# Patient Record
Sex: Female | Born: 1935 | Race: White | Hispanic: No | Smoking: Former smoker
Health system: Southern US, Community
[De-identification: ages and names within clinical notes are randomized; demographics above are authoritative.]

## PROBLEM LIST (undated history)

## (undated) DIAGNOSIS — H409 Unspecified glaucoma: Secondary | ICD-10-CM

## (undated) DIAGNOSIS — E785 Hyperlipidemia, unspecified: Secondary | ICD-10-CM

## (undated) DIAGNOSIS — S72002A Fracture of unspecified part of neck of left femur, initial encounter for closed fracture: Secondary | ICD-10-CM

## (undated) DIAGNOSIS — D62 Acute posthemorrhagic anemia: Secondary | ICD-10-CM

## (undated) DIAGNOSIS — F329 Major depressive disorder, single episode, unspecified: Secondary | ICD-10-CM

## (undated) DIAGNOSIS — S42309A Unspecified fracture of shaft of humerus, unspecified arm, initial encounter for closed fracture: Secondary | ICD-10-CM

## (undated) DIAGNOSIS — K602 Anal fissure, unspecified: Secondary | ICD-10-CM

## (undated) DIAGNOSIS — R259 Unspecified abnormal involuntary movements: Secondary | ICD-10-CM

## (undated) DIAGNOSIS — M79609 Pain in unspecified limb: Secondary | ICD-10-CM

## (undated) DIAGNOSIS — J189 Pneumonia, unspecified organism: Secondary | ICD-10-CM

## (undated) DIAGNOSIS — M810 Age-related osteoporosis without current pathological fracture: Secondary | ICD-10-CM

## (undated) DIAGNOSIS — S0232XA Fracture of orbital floor, left side, initial encounter for closed fracture: Secondary | ICD-10-CM

## (undated) DIAGNOSIS — H6120 Impacted cerumen, unspecified ear: Secondary | ICD-10-CM

## (undated) DIAGNOSIS — H3530 Unspecified macular degeneration: Secondary | ICD-10-CM

## (undated) DIAGNOSIS — I2699 Other pulmonary embolism without acute cor pulmonale: Secondary | ICD-10-CM

## (undated) DIAGNOSIS — H9190 Unspecified hearing loss, unspecified ear: Secondary | ICD-10-CM

## (undated) DIAGNOSIS — E871 Hypo-osmolality and hyponatremia: Secondary | ICD-10-CM

## (undated) DIAGNOSIS — J439 Emphysema, unspecified: Secondary | ICD-10-CM

## (undated) DIAGNOSIS — E538 Deficiency of other specified B group vitamins: Secondary | ICD-10-CM

## (undated) DIAGNOSIS — S02401A Maxillary fracture, unspecified, initial encounter for closed fracture: Secondary | ICD-10-CM

## (undated) DIAGNOSIS — G250 Essential tremor: Secondary | ICD-10-CM

## (undated) DIAGNOSIS — D518 Other vitamin B12 deficiency anemias: Secondary | ICD-10-CM

## (undated) DIAGNOSIS — R1031 Right lower quadrant pain: Secondary | ICD-10-CM

## (undated) DIAGNOSIS — I10 Essential (primary) hypertension: Secondary | ICD-10-CM

## (undated) DIAGNOSIS — Z7901 Long term (current) use of anticoagulants: Secondary | ICD-10-CM

## (undated) DIAGNOSIS — D649 Anemia, unspecified: Secondary | ICD-10-CM

## (undated) DIAGNOSIS — R1011 Right upper quadrant pain: Secondary | ICD-10-CM

## (undated) DIAGNOSIS — G252 Other specified forms of tremor: Secondary | ICD-10-CM

## (undated) DIAGNOSIS — R634 Abnormal weight loss: Secondary | ICD-10-CM

## (undated) DIAGNOSIS — R079 Chest pain, unspecified: Secondary | ICD-10-CM

## (undated) DIAGNOSIS — K565 Intestinal adhesions [bands], unspecified as to partial versus complete obstruction: Secondary | ICD-10-CM

## (undated) DIAGNOSIS — R269 Unspecified abnormalities of gait and mobility: Secondary | ICD-10-CM

## (undated) DIAGNOSIS — F411 Generalized anxiety disorder: Secondary | ICD-10-CM

## (undated) DIAGNOSIS — E039 Hypothyroidism, unspecified: Secondary | ICD-10-CM

## (undated) DIAGNOSIS — M545 Low back pain, unspecified: Secondary | ICD-10-CM

## (undated) DIAGNOSIS — S01312A Laceration without foreign body of left ear, initial encounter: Secondary | ICD-10-CM

## (undated) DIAGNOSIS — M1990 Unspecified osteoarthritis, unspecified site: Secondary | ICD-10-CM

## (undated) DIAGNOSIS — K2970 Gastritis, unspecified, without bleeding: Secondary | ICD-10-CM

## (undated) DIAGNOSIS — H269 Unspecified cataract: Secondary | ICD-10-CM

## (undated) HISTORY — DX: Age-related osteoporosis without current pathological fracture: M81.0

## (undated) HISTORY — DX: Unspecified hearing loss, unspecified ear: H91.90

## (undated) HISTORY — DX: Pain in unspecified limb: M79.609

## (undated) HISTORY — DX: Other pulmonary embolism without acute cor pulmonale: I26.99

## (undated) HISTORY — DX: Chest pain, unspecified: R07.9

## (undated) HISTORY — DX: Essential tremor: G25.0

## (undated) HISTORY — DX: Essential (primary) hypertension: I10

## (undated) HISTORY — DX: Right upper quadrant pain: R10.11

## (undated) HISTORY — DX: Long term (current) use of anticoagulants: Z79.01

## (undated) HISTORY — DX: Hyperlipidemia, unspecified: E78.5

## (undated) HISTORY — DX: Unspecified glaucoma: H40.9

## (undated) HISTORY — DX: Emphysema, unspecified: J43.9

## (undated) HISTORY — DX: Major depressive disorder, single episode, unspecified: F32.9

## (undated) HISTORY — DX: Anemia, unspecified: D64.9

## (undated) HISTORY — DX: Other vitamin B12 deficiency anemias: D51.8

## (undated) HISTORY — DX: Low back pain, unspecified: M54.50

## (undated) HISTORY — DX: Low back pain: M54.5

## (undated) HISTORY — DX: Unspecified abnormal involuntary movements: R25.9

## (undated) HISTORY — DX: Abnormal weight loss: R63.4

## (undated) HISTORY — DX: Unspecified abnormalities of gait and mobility: R26.9

## (undated) HISTORY — DX: Intestinal adhesions (bands), unspecified as to partial versus complete obstruction: K56.50

## (undated) HISTORY — DX: Generalized anxiety disorder: F41.1

## (undated) HISTORY — PX: ABDOMINAL HYSTERECTOMY: SHX81

## (undated) HISTORY — DX: Unspecified osteoarthritis, unspecified site: M19.90

## (undated) HISTORY — DX: Unspecified cataract: H26.9

## (undated) HISTORY — DX: Other specified forms of tremor: G25.2

## (undated) HISTORY — DX: Hypo-osmolality and hyponatremia: E87.1

## (undated) HISTORY — DX: Impacted cerumen, unspecified ear: H61.20

## (undated) HISTORY — DX: Right lower quadrant pain: R10.31

## (undated) HISTORY — DX: Unspecified macular degeneration: H35.30

## (undated) HISTORY — DX: Hypothyroidism, unspecified: E03.9

## (undated) HISTORY — DX: Anal fissure, unspecified: K60.2

---

## 1978-11-06 HISTORY — PX: EXCISIONAL HEMORRHOIDECTOMY: SHX1541

## 1991-11-07 HISTORY — PX: BLADDER SUSPENSION: SHX72

## 2001-11-06 HISTORY — PX: BACK SURGERY: SHX140

## 2005-07-05 ENCOUNTER — Inpatient Hospital Stay (HOSPITAL_COMMUNITY): Admission: EM | Admit: 2005-07-05 | Discharge: 2005-07-22 | Payer: Self-pay | Admitting: *Deleted

## 2005-07-07 ENCOUNTER — Encounter (INDEPENDENT_AMBULATORY_CARE_PROVIDER_SITE_OTHER): Payer: Self-pay | Admitting: *Deleted

## 2007-04-17 ENCOUNTER — Emergency Department (HOSPITAL_COMMUNITY): Admission: EM | Admit: 2007-04-17 | Discharge: 2007-04-17 | Payer: Self-pay | Admitting: Emergency Medicine

## 2007-11-03 ENCOUNTER — Emergency Department (HOSPITAL_COMMUNITY): Admission: EM | Admit: 2007-11-03 | Discharge: 2007-11-03 | Payer: Self-pay | Admitting: Emergency Medicine

## 2007-11-07 HISTORY — PX: TRIGGER FINGER RELEASE: SHX641

## 2008-02-14 ENCOUNTER — Inpatient Hospital Stay (HOSPITAL_COMMUNITY): Admission: EM | Admit: 2008-02-14 | Discharge: 2008-02-17 | Payer: Self-pay | Admitting: Emergency Medicine

## 2008-02-14 ENCOUNTER — Ambulatory Visit: Payer: Self-pay | Admitting: Cardiology

## 2008-03-22 ENCOUNTER — Inpatient Hospital Stay (HOSPITAL_COMMUNITY): Admission: EM | Admit: 2008-03-22 | Discharge: 2008-03-30 | Payer: Self-pay | Admitting: Emergency Medicine

## 2010-06-06 DEATH — deceased

## 2011-03-21 NOTE — Op Note (Signed)
Jane [name], Jane [name]               ACCOUNT NO.:  [id number]   MEDICAL RECORD NO.:  [id number]          PATIENT TYPE:  INP   LOCATION:  1302                         FACILITY:  [facility]   PHYSICIAN:  Angelia Mould. Derrell Lolling, M.D.DATE OF BIRTH:  06-03-1925   DATE OF PROCEDURE:  03/23/2008  DATE OF DISCHARGE:                               OPERATIVE REPORT   PREOPERATIVE DIAGNOSIS:  Small bowel obstruction.   POSTOPERATIVE DIAGNOSIS:  Small bowel obstruction secondary to  adhesions.   OPERATION PERFORMED:  Exploratory laparotomy, lysis of adhesions.   SURGEON:  Dr. Claud Kelp.   OPERATIVE INDICATIONS:  This is an 77 year old white female who  presented to the emergency room late last night with abdominal pain,  nausea and vomiting for 36 to 48 hours.  X-rays and physical exam were  consistent with small bowel obstruction.  She was somewhat tender last  night.  White blood cell count was normal.  This morning she was stable  but still somewhat distended and somewhat tender.  CT scan showed a high-  grade small bowel obstruction with transition point in the mid ileum.  This appeared to be proximal to the staple line where she had had a  previous small bowel resection in 2006 for a bowel obstruction.  There  were no significant hernias noted.  There was some ascites.  Because of  her continued tenderness, I elected to proceed with laparotomy.   OPERATIVE FINDINGS:  The patient had a clear-cut mechanical small bowel  obstruction with significantly dilated small bowel all the way down to  the mid ileum to the proximal ileum and then the distal ileum was  completely collapsed distal to the adhesive obstruction.  There was a  small bowel anastomosis in the distal ileum about 1 foot proximal to the  ileocecal valve where Dr. Ezzard Standing had done small bowel resection before.  There were no problems with that anastomosis.  It was wide open and I  could milk small bowel contents through it prograde and  retrograde.  Small bowel was quite distended proximally and was hyperemic but there  was no evidence of any ischemia or perforation.  There was some bloody  ascites present but there was no odor.  Cultures were taken.  I felt  very carefully around the abdominal wall and pelvis for hernia and I did  not find any evidence of femoral or inguinal hernia.  I did not find any  internal evidence of spigelian hernia, although I thought there was a  weakness on the left side of the abdominal wall which she might have an  early spigelian hernia.  There were no defects in the abdominal wall.  The obstruction was clearly due to interloop adhesions.   OPERATIVE TECHNIQUE:  Following the induction of general endotracheal  anesthesia, a Foley catheter was inserted.  Intravenous antibiotics were  given.  The patient's abdomen was prepped and draped in a sterile  fashion.  The patient was identified as correct patient and correct  procedure.  Lower midline laparotomy incision was made through the  previous scar.  The fascia was incised  in the midline and the abdominal  cavity entered under direct vision.  There were no significant adhesions  to the anterior abdominal wall.  I evacuated some ascites and cultured  it.  I ran the small bowel all the way from the ligament Treitz down to  the ileocecal valve.  I took down lots of adhesions spending about 20  minutes taking all the adhesions down and a couple of the adhesions in  the mid ileum were clearly causing a mechanical obstruction.  Once I had  taken all the adhesions down, I could milk small bowel content distally  through the ileum and the ileocecal valve into the cecum.  I could also  milk the small bowel content proximally all the way back up into the  stomach where we evacuated about 800 or 900 mL of bilious fluid.  After  the small bowel was decompressed, it appeared quite healthy and pink.  There was no sign of any ischemia or inflammatory  process otherwise.  I  irrigated the abdomen with saline.  The irrigation fluid was completely  clear.  There was no bleeding.  The small bowel was carefully returned  to its anatomic position, being careful not to twist or kink it.  The  omentum was returned to its anatomic position.  The midline fascia was  closed with running suture of #1 PDS and skin closed with skin staples.  Clean bandages were placed and the patient taken recovery room in stable  condition.  Estimated blood loss was about 50 mL.  Complications none.  Sponge, needle and instrument counts were correct.      Angelia Mould. Derrell Lolling, M.D.  Electronically Signed     HMI/MEDQ  D:  03/23/2008  T:  03/23/2008  Job:  [id number]   cc:   Frederica Kuster, MD  Fax: [phone]

## 2011-03-21 NOTE — H&P (Signed)
[name], [name] NO.:  [id number]   MEDICAL RECORD NO.:  [id number]          PATIENT TYPE:  EMS   LOCATION:  MAJO                         FACILITY:  [facility]   PHYSICIAN:  Michaelyn Barter, M.D. DATE OF BIRTH:  February 02, 1925   DATE OF ADMISSION:  02/14/2008  DATE OF DISCHARGE:                              HISTORY & PHYSICAL   PRIMARY CARE DOCTOR:  Dr. Jacalyn Lefevre.   CHIEF COMPLAINT:  Chest pain.   HISTORY OF PRESENT ILLNESS:  [name] is an 77 year old female with  a past medical history of hypertension, hypercholesterolemia, and  pulmonary embolism who states that she developed chest pain a couple of  weeks ago.  Her chest pain is  vaguely described, but she initially  pointed to the central region of her chest as the area of involvement.  She later indicated that the pain also involved the left side of her  chest.  She described it as occasionally being severe in intensity.  She  is stating that occasionally her breath is taken away when it is severe.  She has never had similar pain before.  The pain occurs every day,  occasionally a couple of times a day.  Yesterday the pain occurred and  it lasted for several hours.  It makes her feel nervous, her hands began  to shake.  The pain does not radiate to her neck, her back, or down her  arm.  Bending over and exertion make the pain worse.  There are no  alleviating factors identified.  This  morning she was given an aspirin,  and her daughter indicates that the pain became somewhat better.  She  does complain of feeling diaphoretic when the pain occurs.  She denies  the presence of any nausea, vomiting, fevers or chills.   PAST MEDICAL HISTORY:  1. Cardiac catheterization.  The patient states that she had a cardiac      catheterization back in the 1990s at [facility], which is in      [place], [place], the results of which were negative.  2. Macular degeneration.  3. Glaucoma.  4.  Cataracts.  5. Small bowel obstruction.  6. Atelectasis.  7. Sinus tachycardia.  8. Dehydration.  9. Pulmonary embolism.  10.Protein calorie malnutrition.  11.Postoperative delirium.  12.Hypertension.  13.Hypothyroidism.  14.Hyperlipidemia.  15.Depression.   OPERATIONS:  Small bowel resection and enterolysis of adhesions  July 07, 2005, by Dr. Ovidio Kin.   ALLERGIES:  1. CELEBREX.  2. PARAFON FORTE.   CURRENT MEDICATIONS:  1. Xalatan 0.005 drops.  2. Lovastatin 20 mg daily.  3. Sertraline HCl 100 mg daily.  4. Lorazepam 0.5 mg.  5. Levothyroxine 75 mcg daily.  6. Dicyclomine 20 mg daily.  7. Diazepam 5 mg daily.  8. Lisinopril 10 mg daily.   SOCIAL HISTORY:  Cigarettes:  The patient started smoking at the age of  77.  She smoked 1 pack per day.  She stopped smoking approximately 10 to  15 years ago.  Alcohol:  The patient denies.   FAMILY HISTORY:  Mother had dementia, CHF and angina.  Father died from  a cerebral hemorrhage.   REVIEW OF SYSTEMS:  As per HPI; otherwise, all other systems are  negative.   PHYSICAL EXAMINATION:  GENERAL:  The patient is awake.  She is  cooperative.  She is in no obvious respiratory distress.  VITAL SIGNS:  Her temperature is 97.5, blood pressure 139/70, heart rate  74, respirations 18, O2 sat is 95%.  HEENT:  Normocephalic, atraumatic.  Anicteric.  Extraocular movements  are intact.  Oral mucosa is pink.  Dentures are present.  NECK:  Supple.  No JVD, no lymphadenopathy.  Strong carotid upstrokes  bilaterally.  CARDIAC:  Heart sounds are somewhat distant.  S1 and S2 can be  appreciated.  RESPIRATORY:  No crackles or wheezes.  ABDOMEN:  Flat, soft, nontender, nondistended.  No masses palpated.  Bowel sounds are present x4 quadrants.  NEUROLOGICAL:  The patient is  alert and oriented x3.  MUSCULOSKELETAL:  There is 5/5 upper and lower  extremity strength.   Chest x-ray reveals no acute cardiopulmonary findings.  An EKG  reveals  normal sinus rhythm.  No obvious Q waves.  Tall R waves are seen in  leads V4, V5 and V6.  PVCs are also noted.  No significant T-wave  abnormalities.  Labs:  CK-MB, POC 2.4.  Troponin-I, POC less than 0.05.  Sodium 138, potassium 4.6, chloride 104, CO2 is 26,  glucose 96, BUN 14,  creatinine 0.77, calcium 9.  D-dimer 0.31.  PT 14.1, INR 1.1, PTT 34.   ASSESSMENT/PLAN:  1. Chest pain.  The etiology of the patient's current chest pain      cardiac versus noncardiac.  The patient has some very atypical      findings with regards to her description of her chest pain in that      it lasts for hours and it started several weeks ago and occurs      daily; however, she does have several cardiovascular risk factors.      Therefore, we will rule the patient out for a cardiac event via      checking her cardiac markers,  including troponin I plus CK-MB x3      q.8 h apart.  We will provide the patient with p.r.n. morphine,      oxygen, nitroglycerin and daily aspirin.  We will check a fasting      lipid profile as well as a 2-D echocardiogram.  May consider      consultation with cardiology.  I suspect that the patient may      benefit from a stress test either as an inpatient versus an      outpatient.  2. Hypertension.  We will resume the patient's previously prescribed      antihypertensive medications for now.  3. Hyperlipidemia.  We will check a fasting lipid profile.  4. History of hypothyroidism.  We will resume the patient's previously      prescribed levothyroxine.  5. Gastrointestinal prophylaxis.  We will provide Protonix.  6. Deep venous thrombosis prophylaxis.  We will provide Lovenox.      Michaelyn Barter, M.D.  Electronically Signed     OR/MEDQ  D:  02/14/2008  T:  02/14/2008  Job:  [id number]   cc:   Dr. Jacalyn Lefevre

## 2011-03-21 NOTE — H&P (Signed)
Jane [name], Jane [name]               ACCOUNT NO.:  [id number]   MEDICAL RECORD NO.:  [id number]          PATIENT TYPE:  INP   LOCATION:  0104                         [facility]ACILITY:  [facility]   PHYSICIAN:  Angelia Mould. Derrell Lolling, M.D.DATE OF BIRTH:  06-30-1925   DATE OF ADMISSION:  03/22/2008  DATE OF DISCHARGE:                              HISTORY & PHYSICAL   CHIEF COMPLAINT:  Abdominal pain, nausea and vomiting.   HISTORY OF PRESENT ILLNESS:  This is an 77 year old white female who  lives at [facility].  Her health has been stable recently.  She has a  history of exploratory laparotomy and small bowel resection for an  adhesive mechanical small bowel obstruction in 2006 by Dr. Ovidio Kin.  She states that she was going to see Dr. Ezzard Standing in the office this  coming week because she has had lower abdominal hernias that have been  bothering her.   She came to the emergency room tonight with a 2-day history of low  abdominal pain, nausea and vomiting.  She states the pain began in the  early morning hours of Saturday, May 16.  She says the pain in the lower  abdomen is fairly constant, not really crampy in nature.  She had a lot  of brown, non-bloody diarrhea yesterday but no more stools today.  She  vomited several times yesterday and has vomited 4 or 5 times today.  She  says one time when she vomited last night, the emesis looked black to  her.  She has not seen any blood.   She came to the emergency room and was evaluated by Dr. Lorre Nick.  Abdominal x-rays showed some dilated segments of small bowel with some  air-fluid levels and there is no air in the colon.  There is no free  air.  I was asked to evaluate her.   PAST HISTORY:  1. Exploratory laparotomy and small bowel resection for a closed loop      obstruction by Dr. Ovidio Kin on July 07, 2005.  2. Total abdominal hysterectomy, bilateral salpingo-oophorectomy.  3. History of bladder suspension surgery.  4. History of  mild COPD.  5. Hypertension.  6. Hypothyroidism.  7. Depression.  8. Hyperlipidemia.  9. Lower abdominal hernias by history.  10.Macular degeneration and glaucoma.  11.Recent hospitalization April 10 through February 17, 2008 for chest      pain.  Evaluated by Dr. Michaelyn Barter and by Dr. Simona Huh of      [facility].  Myoview stress showed no evidence of ischemia      and no scar was seen.  Ejection fraction was 58%.  She declined      further cardiac testing.   CURRENT MEDICATIONS:  1. Lovastatin 20 mg a day.  2. Ultram 100 mg a day.  3. Levothyroxine 75 mcg a day.  4. Dicyclomine 20 mg a day.  5. Zoloft 100 mg a day.  6. Lisinopril 10 mg a day.  7. Xalatan drops 0.005% at bedtime.   DRUG ALLERGIES:  CELEBREX, PARAFON FORTE.   SOCIAL HISTORY:  The patient lives  in [place] at [facility], having  moved from [place] about 4 years ago.  She is married but her  husband has severe Alzheimer's and lives at [facility].  The patient has two children and one daughter lives in  [place].  The patient states she quit smoking 15 years ago and drinks  alcohol rarely.   FAMILY HISTORY:  Father died age 36 or cerebral hemorrhage.  Mother died  age 100 of congestive heart failure, angina and dementia.   REVIEW OF SYSTEMS:  A 15-system system review of systems is performed  and is noncontributory except as described above.   PHYSICAL EXAMINATION:  GENERAL:  Thin, alert, oriented white female in  minimal distress.  Very pleasant.  VITAL SIGNS:  Temperature 98, blood pressure 139/87, pulse 94,  respirations 18.  HEENT:  Sclerae clear.  Extraocular is intact.  Ears, mouth, throat,  nose, lips, tongue, and oropharynx are without gross lesions.  She has  full dentures.  NECK:  Supple, nontender.  No bruit.  No jugular distention.  No  adenopathy.  No thyroid mass.  LUNGS:  Breath sounds are distant, but there is no wheezing or rhonchi.  There is no  chest wall tenderness.  HEART:  Regular rate and rhythm.  No ectopy, no murmur.  Radial and  femoral pulses are palpable.  No peripheral edema.  ABDOMEN:  She is  mildly distended, diffusely tender with minimal involuntary guarding.  Hypoactive bowel sounds.  Lower midline scar.  I really cannot feel a  mass or hernia in the abdomen or inguinal areas despite the history of  hernia.  Liver and spleen not enlarged.  EXTREMITIES:  She moves all four extremities well without pain or  deformity.  No peripheral edema.  NEUROLOGIC:  No gross motor sensory deficits.   LABORATORY DATA:  Data are reviewed.  I reviewed her abdominal x-rays  which suggest a small bowel obstruction.  Lab work reveals a hemoglobin  of 12, white blood cell count of 4800 with a normal differential.  Complete metabolic panel is normal except for glucose of 165 and BUN of  28 and creatinine 0.96.  Liver function tests normal.  Urinalysis is  basically normal.   ASSESSMENT:  1. Small-bowel obstruction.  I doubt that she has compromised bowel at      this time although close clinical followup is warranted due to her      tenderness.  Suspect this is due to adhesions although there is a      history of hernia not apparent on physical exam this evening.  2. Status post small bowel resection for a mechanical small bowel      obstruction in 2006.  3. Status post hysterectomy and bilateral salpingo-oophorectomy.  4. History of bladder suspension.  5. Hypertension.  6. Hypothyroidism.  7. Mild chronic obstructive pulmonary disease.   PLAN:  1. The patient will be admitted, started on IV fluid rehydration and      nasogastric tube will be inserted to decompress the stomach.  2. We will proceed with a CT scan of the abdomen and pelvis later on      tonight to make sure that I am not missing      a hernia or a focal inflammatory process.  This will also help to      assess whether there is a transition zone.  3. She will be  followed closely.  If she does not improve or her  tenderness persists, she may require laparotomy within the next 24      hours.      Angelia Mould. Derrell Lolling, M.D.  Electronically Signed     HMI/MEDQ  D:  03/22/2008  T:  03/23/2008  Job:  [id number]   cc:   Bertram Millard. Hyacinth Meeker, M.D.  Fax: [phone]   Sandria Bales. Ezzard Standing, M.D.  [address], [address]  [place]  [place] [address]

## 2011-03-21 NOTE — Consult Note (Signed)
NAMERAMANDA, PAULES               ACCOUNT NO.:  [id number]   MEDICAL RECORD NO.:  [id number]          PATIENT TYPE:  INP   LOCATION:  1302                         [facility]ACILITY[facility]   PHYSICIAN:  Hillery Aldo, M.D.   DATE OF BIRTH:  July 09, 1925   DATE OF CONSULTATION:  03/25/2008  DATE OF DISCHARGE:                                 CONSULTATION   PRIMARY CARE PHYSICIAN:  Medical laboratory scientific officer.   PERSON REQUESTING CONSULTATION:  Angelia Mould. Derrell Lolling, M.D.   REASON FOR CONSULTATION:  Postoperative delirium, medical management of  chronic medical problems.   HISTORY OF PRESENT ILLNESS:  The patient is an 77 year old female who  was admitted by Dr. Derrell Lolling on Mar 22, 2008, for evaluation of abdominal  pain secondary to recurrent small bowel obstruction.  The patient has a  history of small bowel obstruction and had a small bowel resection back  in 2006.  She was found to have a recurrent obstruction and underwent  exploratory laparotomy with lysis of adhesion on Mar 23, 2008.  Currently, the patient has developed altered mental status  postoperatively and therefore internal medicine consultation was  requested to evaluate this to help manage her significant chronic  medical problems.  Speaking with the nursing staff, the nurses report  that the patient has been intermittently combative last night but today  has been calmer though she is disoriented and confused.  She apparently  has received multiple doses of morphine for pain control over the night.   PAST MEDICAL HISTORY:  1. Mild chronic obstructive pulmonary disease.  2. Hypertension.  3. Hypothyroidism.  4. Depression.  5. Hyperlipidemia.  6. Abdominal hernia.  7. Macular degeneration.  8. Glaucoma.  9. History of pulmonary embolism after small bowel resection in 2006.  10.History of postoperative delirium.  11.Cataracts.  12.Normal stress test February 16, 2008, with an ejection fraction of      58%.   PAST SURGICAL HISTORY:  1. Exploratory laparotomy with small bowel resection in 2006.  2. Total abdominal hysterectomy/bilateral salpingo-oophorectomy.  3. Bladder suspension surgery.  4. Back surgery.   SOCIAL HISTORY:  The patient is married.  Her husband has Alzheimer's  dementia and resides at [facility] while the patient lives at  [facility].  She quit smoking approximately  15 years ago and prior to this smoked one pack per day for 40-45 years.  She rarely drinks alcohol.  She is retired.   FAMILY HISTORY:  The patient's father died at 50 from a cerebral  hemorrhage.  The patient's mother died at 42 from congestive heart  failure, she also had angina and dementia.   CURRENT MEDICATIONS:  1. Synthroid 37.5 mg IV daily.  2. Protonix 40 mg IV daily.  3. Morphine 0.5 to 1 mg q.1 h p.r.n.  4. Zofran 4 mg IV q.6 h p.r.n.  5. Phenergan 12.5 mg q.4 h p.r.n.   ALLERGIES:  Celebrex, Parafon Forte.   REVIEW OF SYSTEMS:  The patient denies fever, chills, chest pain,  dyspnea, cough.  She is having abdominal pain and discomfort.  Otherwise, comprehensive 14  point review of systems is negative.   PHYSICAL EXAMINATION:  VITAL SIGNS:  Temperature 99.3, blood pressure  165/87, pulse 101, respirations 18, O2 saturation 90% on 2 liters.  GENERAL:  This is a well-developed, well-nourished female who is  anxious.  HEENT:  Normocephalic, atraumatic.  PERRL.  EOMI.  Oropharynx is clear.  The patient has an NG tube in place draining bilious material.  NECK:  Supple, no thyromegaly, no lymphadenopathy, no jugular venous  distention.  CHEST:  Decreased breath sounds clear bilaterally.  HEART:  Tachycardiac rate, regular rhythm.  No murmurs, rubs, gallops.  ABDOMEN:  Soft.  There are hyperactive bowel sounds with borborygmi.  She is diffusely tender.  EXTREMITIES:  No clubbing, cyanosis or edema.  NEUROLOGICAL:  The patient awakens to voice.  She is disoriented.  She  thinks she is at [facility]  She thinks the month is April.  Otherwise nonfocal.  SKIN:  Warm and dry.  No rashes.  PSYCHIATRIC:  The patient is anxious, would not let go of my hand and  requested that I stay with her.   DATA REVIEW:  Laboratory data:  Sodium is 137, potassium 4.1, chloride  105, bicarb 26, BUN 22, creatinine 1.02, glucose 142.  White blood cell  count 5.7, hemoglobin 11.3, hematocrit 33.6, platelets 233.  TSH is  1.316.   Chest x-ray shows NG tube in place.  There is bilateral basilar streaky  atelectasis or scarring.  Probable small bilateral pleural effusions.  No acute edema.   ASSESSMENT AND PLAN:  1. Small bowel obstruction status post exploratory laparoscopy and      lysis of adhesions:  The patient currently has NG tube to low      suction.  She is having some significant postoperative pain and      would manage this as conservatively as possible given her delirium.  2. Postoperative delirium:  This is likely due to narcotics given her      known history of this from prior surgeries.  Would try to limit the      narcotic use as much as possible while still controlling her pain.      The patient has a history of postoperative pulmonary embolism and      she does appear to be slightly hypoxic with her oxygen saturation      of only 90% on 2 liters as well as her tachycardia.  She will need      a scan to rule out recurrent pulmonary embolism.  3. Renal insufficiency:  Continue IV fluids.  Unclear if her current      creatinine clearance would allow for a CT angiogram to rule out      pulmonary embolism.  If not, I would obtain a V/Q scan.  4. Dyslipidemia:  Will resume the patient's statin therapy when she is      taking p.o.'s.  5. Hypothyroidism:  Continue the patient's Synthroid at half her usual      dose through the intravenous route.  Her TSH reveals this is under      good control.  6. Depression/anxiety:  Resume the patient's Zoloft when she is taking       adequate p.o.'s.  7. Hypertension:  We will resume the patient's lisinopril when she is      taking p.o.'s.  At this point, I would add a low dose beta-blocker      for blood pressure.  8. Glaucoma:  Continue the  patient's Xalatan eye drops.  9. Chronic obstructive pulmonary disease:  The patient's respiratory      status was stable.  Would administer pulmonary toilet while awake.  10.History of pulmonary embolism:  The patient has been on Lovenox      prophylaxis but would rule out recurrent pulmonary embolus given      her known history of same after previous surgery.   Thank you for this consultation.  We will follow the patient with you.      Hillery Aldo, M.D.  Electronically Signed     CR/MEDQ  D:  03/25/2008  T:  03/25/2008  Job:  [id number]

## 2011-03-21 NOTE — Discharge Summary (Signed)
NAMEMCKENZI, BUONOMO               ACCOUNT NO.:  [id number]   MEDICAL RECORD NO.:  [id number]          PATIENT TYPE:  INP   LOCATION:  3737                         FACILITY:  [facility]   PHYSICIAN:  Michaelyn Barter, M.D. DATE OF BIRTH:  [birth year]   DATE OF ADMISSION:  02/14/2008  DATE OF DISCHARGE:  02/17/2008                               DISCHARGE SUMMARY   FINAL DIAGNOSES:  Chest pain.   PROCEDURES:  Nuclear medicine myocardial perfusion stress test completed  on February 16, 2008.   CONSULTATIONS:  [facility].   HISTORY OF PRESENT ILLNESS:  Ms. Ditto is an 77 year old female who  states she developed chest pain several weeks prior to the admission.  Her chest pain was vaguely described, but she initially pointed to the  central region of her chest as the area of involvement as well as the  left side of her chest.  She described it as occasionally severe in  intensity and occasionally takes her breath away when it as severest.  The pain occurred every day, lasting several hours a day.   PAST MEDICAL HISTORY:  Please see that dictated by Dr. Michaelyn Barter.   HOSPITAL COURSE:  Chest pain.  The patient was admitted to this  telemetry floor.  Her cardiac markers were cycled.  Her troponin-I was  found to be 0.04, 0.02 x2.  Likewise, her CK-MBs were also found to be  3.8, 2.9, and 2.3.  She, therefore, ruled out for cardiac event.  An EKG  was completed, it revealed normal sinus rhythm with an occasional PVC.  A chest x-ray was also completed, which revealed a resolved bilateral  pleural effusion.  No acute cardiopulmonary abnormalities were noted.  [facility] was consulted, Dr. Nona Dell saw the patient on  February 15, 2008.  He indicated that the patient's symptoms were  concerning for angina.  He concurred that the patient's cardiac markers  did argue again high-risk acute coronary syndrome; however, her EKG was  nonspecific.  He went on to state that  because of the patient's multiple  cardiac risk factors and the description of fairly typical angina, he  would prefer to perform a cardiac catheterization to clearly define the  patient's coronary anatomy and assess for any revascularizations  strategies.  The patient, however, preferred not to pursue a cardiac  catheterization at this point, but indicated that she would consider an  inpatient Myoview test.  A nuclear medicine myocardial stress test was  completed on February 16, 2008.  The final impression was that, there was a  normal study demonstrating no evidence of inducible ischemia with  treadmill exercise.  No scar was demonstrated.  Normal left ventricular  EF within estimated EF of 58% was seen.  The patient indicated that she  felt significantly better by the date of discharge, and she verbalized  that she wanted to be discharged from the hospital.  Dr. Ival Bible  final note indicated that the patient did not want to pursue further  cardiac testing at this particular time.  He stated that she could  follow up with her primary physician,  and if her symptoms persisted, he  will suggest further cardiac evaluation.  The decision has been made at  the request of the patient to discharge her from the hospital.   DISCHARGE VITAL SIGNS:  Her temperature is 97.9, heart rate 79,  respirations 18, blood pressure 116/65, and O2 sat 97% on 3 L of oxygen.   MEDICATIONS:  The patient will be discharged home on:  1. Lovastatin 20 mg p.o. daily.  2. Lisinopril 10 mg p.o. daily, as well as an aspirin 81 mg p.o.      daily.  She will be told to continue all of her prior home      medications.      Michaelyn Barter, M.D.  Electronically Signed     OR/MEDQ  D:  02/17/2008  T:  02/18/2008  Job:  [id number]

## 2011-03-21 NOTE — Consult Note (Signed)
NAMEJUNO, [name] NO.:  [id number]   MEDICAL RECORD NO.:  [id number]          PATIENT TYPE:  INP   LOCATION:  3737                         FACILITY:  [facility]   PHYSICIAN:  Jane Sidle, MD DATE OF BIRTH:  [birth year]   DATE OF CONSULTATION:  DATE OF DISCHARGE:                                 CONSULTATION   REQUESTING PHYSICIAN:  Dr. Michaelyn [name] with the [facility].   PRIMARY CARE PHYSICIAN:  Dr. Jacalyn Lefevre   REASON FOR CONSULTATION:  Chest pain.   HISTORY OF PRESENT ILLNESS:  Jane [name] is a pleasant 77 year old  woman with a reported history of hypertension, hyperlipidemia, prior  small-bowel obstruction with a postoperative pulmonary embolus back in  2006 treated with Coumadin, hypothyroidism, and reportedly a reassuring  cardiac catheterization done approximately 10-15 years ago at a facility  in [place].  She states that she has long-term chronic lung disease  due to tobacco use and has had stable dyspnea on exertion typically at  NYHA class II, somewhat worse with increased levels of activity such as  going up an incline.  Over the last 2 weeks she has been experiencing a  violent substernal chest pain with activity associated with a feeling  of more intense breathlessness and at times a numbness in her left arm.  She states that when this occurs she stops what she is doing and rests,  and her symptoms typically abate.  She notes that inhalers have not been  helpful.  She also states that she was placed on a proton pump inhibitor  recently without significant improvement.  She was admitted to the  hospital for observation and has not had any symptoms at rest.  Her  electrocardiogram shows sinus rhythm with premature ventricular  complexes and nonspecific ST-T wave changes.  Cardiac markers have been  normal.  Her LDL cholesterol is elevated at 136.  Otherwise, she has had  no specific dysrhythmias.  She denies having any  follow-up ischemic  evaluation since her remote catheterization.   ALLERGIES:  1. CELEBREX.  2. PARAFON FORTE.   PRESENT MEDICATIONS:  1. Aspirin 325 mg p.o. daily.  2. Enoxaparin 40 mg subcu q.h.s.  3. Xalatan eyedrops.  4. Levothyroxine 75 mcg p.o. daily.  5. Prinivil 10 mg p.o. daily.  6. Toprol-XL 25 mg p.o. daily.  7. Protonix 40 mg p.o. daily.  8. Zoloft 100 mg p.o. daily.  9. Zocor 10 mg p.o. q.h.s.   PAST MEDICAL HISTORY:  Is outlined above.  Additional problems include:  1. Macular degeneration.  2. Glaucoma.  3. Cataracts.  4. History of depression.   SOCIAL HISTORY:  The patient has a longstanding tobacco-use history.  She stopped smoking approximately 10-15 years ago.  Denies any alcohol  use.  She has been living in [place] the last 4 years.  She has a  daughter that lives locally.   SURGICAL HISTORY:  Includes small-bowel resection and lysis of adhesions  back in 2006.   FAMILY HISTORY:  Was reviewed.  No clear history of premature  cardiovascular disease noted.  REVIEW OF SYSTEMS:  As outlined above.  No obvious cough or hemoptysis.  No palpitations or syncope.  No orthopnea, PND, lower extremity edema.  Otherwise negative.   EXAMINATION:  Temperature is 98.4 degrees, heart rate 74, respirations  24, systolic blood pressure 98-138 over diastolic 62-79, oxygen  saturation is 98% on 3 liters nasal cannula.  [name], elderly  woman in no acute distress without active chest pain.  HEENT:  Conjunctivae and lids are normal.  Oropharynx is clear.  NECK:  Supple.  No elevated jugular venous pressure.  No loud bruits.  No thyromegaly is noted.  LUNGS:  Clear although with diminished breath sounds throughout,  somewhat coarse breath sounds.  No wheezing or labored breathing.  CARDIAC EXAM:  Reveals a regular rate and rhythm.  No loud systolic  murmur or gallop noted.  Possibly soft midsystolic click.  No  pericardial rub.  ABDOMEN:  Soft, nontender.   Bowel sounds present.  EXTREMITIES:  Exhibit no significant pitting edema.  Distal pulses are  2+.  SKIN:  Warm and dry.  MUSCULOSKELETAL:  Kyphosis is noted.  NEUROPSYCHIATRIC:  The patient is alert and oriented x3.  Affect is  appropriate.   LABORATORY DATA:  Wbc's 5.5, hemoglobin 12.3, hematocrit 36.5, platelets  176.  Sodium 139, potassium 4.6, chloride 104, bicarb 26, glucose 96,  BUN 15, creatinine 0.86.  Peak troponin I 0.04, peak CK-MB 3.8.  LDL  136.   Chest x-ray demonstrates borderline cardiomegaly without edema and no  focal airspace disease.   IMPRESSION:  1. A 2-week history of intermittent exertional chest pain associated      with breathlessness, intermittent left arm numbness and also      diaphoresis.  This is concerning for angina.  Her cardiac markers      argue against a high risk acute coronary syndrome, however, and her      electrocardiogram is nonspecific.  She reports a remote cardiac      catheterization approximately 10-15 years ago in [place] which      apparently showed no significant degree of coronary stenosis.  She      has had no follow-up ischemic testing since that time.  2. Apparent history of chronic lung disease, presumably emphysematous,      with a history of longstanding tobacco use.  3. Hypertension.  4. Hyperlipidemia.  5. Prior history of postoperative pulmonary embolus in 2006, treated      with Coumadin, with follow-up normal D-dimer.   RECOMMENDATIONS:  I discussed the situation with the patient and her  daughter.  Frankly, given her cardiac risk factors and description of  fairly typical angina, my preference would be a cardiac catheterization  to clearly  define her coronary anatomy and assess for any revascularizations  strategies.  She preferred not to pursue this option at the present time  although is willing to consider an inpatient Myoview.  This will  therefore be scheduled for tomorrow and we can make further   recommendations pending result review.      Jane Sidle, MD  Electronically Signed     SGM/MEDQ  D:  02/15/2008  T:  02/15/2008  Job:  [id number]   cc:   Jane Kuster, MD  Jane [name], M.D.

## 2011-03-24 NOTE — Discharge Summary (Signed)
Jane [name], Jane [name]               ACCOUNT NO.:  [id number]   MEDICAL RECORD NO.:  [id number]          PATIENT TYPE:  INP   LOCATION:  2019                         FACILITY:  [facility]   PHYSICIAN:  Jonna L. Robb Matar, M.D.DATE OF BIRTH:  29-Aug-1925   DATE OF ADMISSION:  07/05/2005  DATE OF DISCHARGE:                                 DISCHARGE SUMMARY   INTERIM SUMMARY:   PRIMARY CARE PHYSICIAN:  Medical laboratory scientific officer.   CONSULTANT:  Surgery, Dr. Carolynne Edouard.   OPERATION:  July 07, 2005, small bowel resection and enterolysis of  adhesions by Dr. Ezzard Standing.   DIAGNOSES:  1.  Small-bowel obstruction secondary to closed loop obstruction.  2.  Atelectasis.  3.  Sinus tachycardia.  4.  Dehydration.  5.  Pulmonary embolus.  6.  Protein calorie malnutrition.  7.  Postoperative delirium.  8.  Hypertension.  9.  Hypothyroidism.  10. Hyperlipidemia.  11. Depression.   ALLERGIES:  CELEBREX.   CODE STATUS:  Full.   HISTORY:  [age]-year-old hypertensive female with a 4- to 5-day  history of nausea, vomiting and abdominal pain, intense diarrhea just prior.   PHYSICAL EXAMINATION:  Physical exam on admission was notable for heart rate  of 118, 90% O2 saturation, moderate abdominal distress, dehydration with dry  mucosa and positive turgor, distended abdomen, very tender in the right  lower quadrant with rebound and hyperactive bowel sounds.   HOSPITAL COURSE:  The patient was seen in consultation by Surgery regarding  her small-bowel obstruction.  NG was passed.  She was put on n.p.o. and IV  fluids, but did not improve and so the patient was taken to the operating  room, where she was found to have a closed loop obstruction and some  compromise of the small bowel; that area was resected and adhesions were  lysed.  Postoperative, the patient developed significant tachycardia which  responded to a trial of fluids.  Cardiac enzymes were negative.  She was  restarted on some of her  medications IV.  She had postoperative ileus for  several days.  Because of a tachycardia and some tachypnea, CT was done  which showed a pleural embolus, a pleural effusion, atelectasis and COPD,  and the patient was put on Lovenox and after a couple of days, intensive  pulmonary toilet was added.  Her albumin was low and she was having  worsening nutrition, and she was started on TNA.  The patient continued to  have quite a bit of confusion postoperative, but slowly improved.  The  patient was started on Reglan and by the 7th, was starting to pass gas, but  continued to have significant right-sided abdominal pain.   At the time of this dictation, there is some question as to whether the  patient has developed an abscess since the right abdominal pain is  persistent.  She is going to get a CT of the abdomen and subsequent  followup.  She has also had some issues with occasional episodes of SVT.  Discharge disposition and medications will be determined.      Jonna L. Robb Matar, M.D.  Electronically Signed     JLB/MEDQ  D:  07/14/2005  T:  07/14/2005  Job:  [id number]   cc:   [facility]

## 2011-03-24 NOTE — Discharge Summary (Signed)
NAMEKIONI, STAHL               ACCOUNT NO.:  [id number]   MEDICAL RECORD NO.:  [id number]          PATIENT TYPE:  INP   LOCATION:  6743                         FACILITY:  [facility]   PHYSICIAN:  Lonia Blood, M.D.DATE OF BIRTH:  15-Jul-1925   DATE OF ADMISSION:  07/05/2005  DATE OF DISCHARGE:                                 DISCHARGE SUMMARY   NOTE:  Please note, this is an interim discharge summary only.  It covers  the time frame of July 14, 2005 through July 19, 2005.   DISCHARGE DIAGNOSES:  1.  Small bowel obstruction secondary to closed loop obstruction, status      post surgical correction.  2.  Pulmonary embolism.      1.  Newly diagnosed.      2.  Requiring full anticoagulation for a minimum of 6 months.  3.  Sinus tachycardia, resolved - multifactorial.  4.  Protein calorie malnutrition secondary to small bowel obstruction.  5.  Postoperative delirium - resolving.  6.  Hypertension - controlled.  7.  Hypothyroidism - new lower Synthroid dose at 25 mcg p.o. daily.  8.  Hyperlipidemia - holding treatment until intake increased.  9.  Depression.  10. Allergy to CELEBREX.   MEDICATIONS AT THE TIME OF DISCHARGE:  To be determined at actual discharge.   CONSULTATIONS:  General surgery - Dr. Carolynne Edouard.   PROCEDURES:  No new during this interim period.   HISTORY OF PRESENT ILLNESS:  Please see complete dictated H&P per Dr. Lavera Guise,  July 05, 2005, labeled #[id number].   INTERIM HOSPITAL COURSE:  Ms. Rosamae Rocque was under my care between the  dates of July 14, 2005 and July 19, 2005.  The vast majority of  this period of the hospitalization was focused about simply slowly advancing  the patient's diet.  At the initial portion of this hospitalization, the  patient was completely TNA dependent and NPO.  By the end of this stretch of  time, the patient's NPO status was lifted, and TNA was able to be  discontinued.   On outstanding issue at this time is the  patient's pulmonary embolism.  This  is clearly secondary to bedridden status in the perioperative period.  Anticoagulation was not used initially because of high risk for bleeding in  the perioperative period.  Lovenox had to be dosed; however, because of the  pulmonary embolism and extremely high risk of recurrence, the patient  tolerated this throughout the remainder of this hospital period without  evidence of blood loss.  On the date of this dictation, Coumadin is being  initiated.  The patient will require a minimum of 6 months, but more than  likely 12 total months, of anticoagulation, for treatment of her pulmonary  embolism.  Full work-up for hypercoagulable state is not felt to be  necessary, as this seems to be clearly related to bedridden status due to  her small bowel obstruction and surgery.   The patient has a long-standing history of hypothyroidism, for which she  apparently was on 50 mcg of Synthroid p.o. daily previously.  Initially,  this  was decreased to 25 mg IV daily as per usual protocol of converting  p.o. to IV dosing.  With development of tachycardia, which was likely  related to pulmonary embolism, a precaution was taken to discontinue  Synthroid altogether.  On the date of this dictation, I am resuming  Synthroid at 25 mcg p.o. daily with Synthroid to be advanced as necessary  with outpatient follow up.   At the time of this dictation, the patient is improving nicely.  She is not  quite ready for discharge at this time, as we need to assure that she is  tolerating a full regular diet.  Physical therapy and occupational therapy  are actively working with the patient, and decisions are being made to so  appropriate placement.  At this time, it does appear that the patient will  be suitable for independent living with home health assistance in a  supportive environment.      Lonia Blood, M.D.  Electronically Signed     JTM/MEDQ  D:  07/19/2005  T:   07/19/2005  Job:  [id number]

## 2011-03-24 NOTE — Discharge Summary (Signed)
NAMEDALPHINE, COWIE               ACCOUNT NO.:  [id number]   MEDICAL RECORD NO.:  [id number]          PATIENT TYPE:  INP   LOCATION:  1302                         [facility]ACILITY[facility]   PHYSICIAN:  Angelia Mould. Derrell Lolling, M.D.DATE OF BIRTH:  November 02, 1925   DATE OF ADMISSION:  03/22/2008  DATE OF DISCHARGE:  03/30/2008                               DISCHARGE SUMMARY   FINAL DIAGNOSES:  1. Mechanical small bowel obstruction secondary to adhesions.  2. History of laparotomy and small bowel resection for small bowel      obstruction 2006.  3. Status post hysterectomy and bilateral salpingo-oophorectomy.  4. History of bladder suspension surgery.  5. History of mild COPD (chronic obstructive pulmonary disease).  6. Hypertension.  7. Hypothyroidism.  8. Depression.  9. Hyperlipidemia.  10.Macular degeneration and glaucoma.   OPERATION PERFORMED:  Exploratory laparotomy with lysis of adhesions,  date Mar 23, 2008.   HISTORY:  This is an 77 year old white female who lives at [place]  with relatively stable health.  She has a history of small bowel  obstruction in the past requiring laparotomy and small bowel resection  in 2006.  She came to the emergency room with a 48 hour history of lower  abdominal pain, nausea and vomiting and some diarrhea.  She was  evaluated by Dr. Freida Busman in the emergency room where abdominal x-rays  showed some dilated segments of small bowel with air fluid levels but no  free air.  I was called to see her.  I felt that she had a partial small  bowel obstruction and she was admitted to the hospital.   HOSPITAL COURSE:  On the day of admission I felt that the patient's  abdomen was a little bit distended and mildly tender but I did not feel  a mass or hernia and I did not think that she had any compromised bowel  at this time.  We proceeded with CT scan which showed a small bowel  obstruction.  There was lots of dilated small bowel with a transition  possibly in the  mid ileum and mild ascites and a question of a tiny  right inguinal hernia.   The following morning she seemed more tender with more guarding and I  felt that she would need to be explored despite the fact that she had no  fever and her white blood cell count was normal.  She was taken to the  operating room on May 18 and underwent laparotomy.  I found that she had  a mechanical small bowel obstruction.  The bowel was edematous with some  venous congestion but clearly viable and no evidence of ischemia or  tumor.  Lysis of adhesions was performed.   Postoperatively the patient did fairly well.   From a medical standpoint I asked Dr. Trula Ore Rama to follow her from  the medical standpoint to assist in advice regarding managing her  medical problems and she did that very well.   The patient had an ileus for several days and ultimately that resolved  and we were able to get the NG tube out and  resume a diet with return of  bowel function.   Postoperatively she had acute delirium and confusion which got better as  we limited her narcotic and mobilized her.  She also had problems with  COPD and atelectasis but that cleared up with pulmonary toilet and she  never developed pneumonia.  She was maintained on Lovenox for DVT  prophylaxis and maintained on beta-blockers for cardiac prophylaxis.   We started a liquid diet on May 22 and slowly advanced her diet and  activities thereafter.  She improved to the point where she could be  discharged on May 25.  At that time she was tolerating a regular diet  and had had bowel movements, was very talkative and felt well and was  ready to go home.  Her abdomen was soft and her wounds were healing.   FOLLOWUP:  On discharge she was asked to follow up with me in 2 weeks.   DISCHARGE MEDICATIONS:  1. She was given a prescription for Vicodin for pain.  2. She was told to continue her usual medications which include      lovastatin 20 mg a day.  3.  Ultram 100 mg a day.  4. Levothyroxine 75 mcg a day.  5. Dicyclomine 20 mg a day.  6. Zoloft 100 mg a day.  7. Lisinopril 10 mg a day.  8. Xalatan drops 0.005% at bedtime.      Angelia Mould. Derrell Lolling, M.D.  Electronically Signed     HMI/MEDQ  D:  05/03/2008  T:  05/03/2008  Job:  [id number]   cc:   Bertram Millard. Hyacinth Meeker, M.D.  Fax: [phone]

## 2011-03-24 NOTE — H&P (Signed)
[name], [name]               ACCOUNT NO.:  [id number]   MEDICAL RECORD NO.:  [id number]          PATIENT TYPE:  INP   LOCATION:  3309                         FACILITY:  [facility]   PHYSICIAN:  Lonia Blood, M.D.       DATE OF BIRTH:  [birth year]   DATE OF ADMISSION:  07/05/2005  DATE OF DISCHARGE:                                HISTORY & PHYSICAL   CHIEF COMPLAINT:  Abdominal pain.   HISTORY OF PRESENT ILLNESS:  A 77 year old Caucasian woman with a history of  hypertension, hypothyroidism, comes in to the emergency room after being  referred from her primary care physician for 4-5 days of nausea, vomiting,  and abdominal pain.  She was unable to eat during all this.  Before all this  started, she reports intensive diarrhea.  For the past four days she has not  been able to pass any gas, and she has not been able to eat or drink.  An  imaging study done at an outside center was reported as showing a small-  bowel obstruction but no acute appendicitis.   PAST MEDICAL HISTORY:  1.  Hypertension.  2.  Hypothyroidism.  3.  Depression.  4.  Hyperlipidemia.  5.  History of total hysterectomy bilateral salpingo-oophorectomy.  6.  History of bladder suspension surgery.  7.  Also the patient carries a history of some mild emphysema.   SOCIAL HISTORY:  The patient lives in [place], moved here a year ago from  [place].  She is married.  Her husband has Alzheimer disease and she  has a daughter here in [place].  She is retired.  She used to smoke for a  long period of time but quit 15 years ago.  She does not drink any alcohol.   REVIEW OF SYSTEMS:  Negative for chest pain, negative for dyspnea at rest,  positive for dyspnea on exertion.  Of note, the patient never had a  colonoscopy.  Other 12 systems were reviewed and they are all negative.   PHYSICAL EXAMINATION:  VITAL SIGNS:  Temperature 96.8, blood pressure  155/91, heart rate 118, respirations 18, saturating 90% on room  air.  GENERAL:  The patient appears in moderate distress secondary to pain and  nausea.  She is severely dehydrated with dry buccal mucosa and dry skin.  She has positive turgor.  EYES:  Pupils equal, round, reactive to light accommodation.  Extraocular  movements intact.  NECK:  Supple without JVD, without carotid bruits.  LUNGS:  Clear to auscultation bilaterally without wheezes, rhonchi,  crackles.  HEART:  Regular rate and rhythm without murmurs, rubs, or gallops.  ABDOMEN:  Distended, tender especially in the right lower quadrant where the  patient also has rebound tenderness.  She has hyperactive bowel sounds in  the right lower quadrant.  EXTREMITIES:  Have no edema.  She has intact muscle tone and the bulk.  NEUROLOGIC:  III-XIII cranial nerves __________  intact.  PSYCHIATRIC:  She appears to have intact insight, memory, and mood.   There are no laboratory values at the time of this dictation.  ASSESSMENT/PLAN:  1.  Abdominal pain, nausea and vomiting with a very positive abdominal      examination.  I am worried about an acute abdomen, can not rule out a      complicating condition like cancer causing a small-bowel obstruction,      versus appendicitis, versus active ileitis, versus other condition.  The      plan at this point is to check a complete metabolic profile, check a      lipase/amylase, check an acute abdominal series, also get a report of      the CT scan that was done earlier, also the plan is to admit the patient      to a stepdown unit, hydrate her with intravenous fluids, and obtain a      surgical consultation.  2.  Hypothyroidism.  We will check a TSH and continue the Synthroid.  3.  We are going to hold all her blood pressure medication, cholesterol      medication, depression medication for now.           ______________________________  Lonia Blood, M.D.     SL/MEDQ  D:  07/05/2005  T:  07/05/2005  Job:  [id number]   cc:   Ollen Gross. Vernell Morgans, M.D.   [address]  [place]  [place] [address]   Maxwell Caul, M.D.  [address]  [place]  [place] [address]  Fax: [phone]

## 2011-03-24 NOTE — Consult Note (Signed)
[name], Jane [name]               ACCOUNT NO.:  [id number]   MEDICAL RECORD NO.:  [id number]          PATIENT TYPE:  INP   LOCATION:  3309                         FACILITY:  [facility]   PHYSICIAN:  Ollen Gross. Vernell Morgans, M.D. DATE OF BIRTH:  1925/09/26   DATE OF CONSULTATION:  07/05/2005  DATE OF DISCHARGE:                                   CONSULTATION   [name] is a 77 year old white female who presents today with history  of nausea, vomiting, and diarrhea that started on Sunday. She denies any  fevers. She has had some crampy abdominal pain and her nausea has persisted.  She has not had hardly any diarrhea since yesterday. She does not recall  passing any flatus today. Her only complaint is of some right lower quadrant  pain. The rest of her review of systems are unremarkable.   PAST MEDICAL HISTORY:  1.  Significant for hypertension.  2.  Hypothyroidism.  3.  Depression.  4.  Hyperlipidemia.  5.  Emphysema.   PAST SURGICAL HISTORY:  1.  Hysterectomy.  2.  Back surgery.  3.  Teeth removal.   MEDICATIONS:  Synthroid, Lovastatin, hydrochlorothiazide, Lisinopril, and  Zoloft.   ALLERGIES:  CELEBREX.   SOCIAL HISTORY:  She quit smoking some years ago. Denies any alcohol use.  Family history is noncontributory.   PHYSICAL EXAMINATION:  VITAL SIGNS:  Temperature is 96.8, blood pressure  155/91.  GENERAL:  She is an elderly white female in no acute distress.  SKIN:  Warm and dry with no jaundice.  HEENT:  Eyes show her extraocular movements intact. Pupils are equal, round,  and reactive to light. Sclerae are nonicteric.  LUNGS:  Clear bilaterally with no use of accessory respiratory muscles.  HEART:  Regular rate and rhythm with an impulse in the left chest.  ABDOMEN:  Soft. She has mild to moderate abdominal distension. Normal bowel  sounds. She does have some right lower quadrant pain but has no guarding or  perineal signs. No palpable mass or hepatosplenomegaly.  EXTREMITIES:  No clubbing, cyanosis, or edema with good strength in her arms  and legs.  PSYCHOLOGICALLY:  She is alert and oriented x3 with no evidence of anxiety  or depression.   LABORATORY DATA:  All pending. She had a CT scan done at [facility] that  was reported to show a small bowel obstruction but apparently no other  complications or intra-abdominal processes.   ASSESSMENT/PLAN:  This is a 77 year old white female who clinically appears  to have small bowel obstruction. We will have her films sent over for review  here tonight with the radiologist. We will also check her lab work  including her electrolytes and white count. If she has no signs of bowel  ischemia or complications, then we will plan to place an nasogastric tube.  We will place her on strict bowel rest, rehydrate her, correct her  electrolytes, and follow her closely with you.      Ollen Gross. Vernell Morgans, M.D.  Electronically Signed     PST/MEDQ  D:  07/05/2005  T:  07/06/2005  Job:  [id number]

## 2011-03-24 NOTE — Discharge Summary (Signed)
Jane [name], Jane [name]               ACCOUNT NO.:  [id number]   MEDICAL RECORD NO.:  [id number]          PATIENT TYPE:  INP   LOCATION:  6743                         FACILITY:  [facility]   PHYSICIAN:  Danae Chen, M.D.DATE OF BIRTH:  1925-04-25   DATE OF ADMISSION:  07/05/2005  DATE OF DISCHARGE:  07/22/2005                                 DISCHARGE SUMMARY   Please refer to discharge summaries per Dr. Robb Matar, job #[id number], and  discharge summary per Dr. Sharon Seller, job [id number]. This discharge summary will  cover time period from September 13th through September 16th.   HOSPITAL COURSE:  The patient continued to improve following her surgery for  small bowel obstruction secondary to closed loop obstruction. She had also  developed pulmonary embolism postoperatively and Lovenox and Coumadin  therapy were initiated. At the time of discharge her INR was 2.0 and she is  to be discharged home on warfarin 2 mg daily. She is to be sent home on  Warfarin 2 mg daily. She is to follow up with Dr. Leanord Hawking within one week's  time to her PT-INR rechecked. The patient continued to take Lovenox and  there was only one day of overlap of therapeutic INR level of 2.0.  The  patient is to resume her home medications at prior dosages. Only new  admission was the addition of Coumadin which she will probably have to take  for a minimum of two months and more likely 12 months, but this can be  determined by her primary care physician, Dr. Leanord Hawking.  At the time of  discharge also the patient was eating regular foods, had bowel movements,  and no other complaints.      Danae Chen, M.D.  Electronically Signed     RLK/MEDQ  D:  07/22/2005  T:  07/23/2005  Job:  [id number]

## 2011-03-24 NOTE — Op Note (Signed)
Jane [name], HOLLINGSHEAD               ACCOUNT NO.:  [id number]   MEDICAL RECORD NO.:  [id number]          PATIENT TYPE:  INP   LOCATION:  3309                         FACILITY:  [facility]   PHYSICIAN:  Sandria Bales. Ezzard Standing, M.D.  DATE OF BIRTH:  [birth year]   DATE OF PROCEDURE:  07/07/2005  DATE OF DISCHARGE:                                 OPERATIVE REPORT   PREOPERATIVE DIAGNOSIS:  Persistent small bowel obstruction.   POSTOPERATIVE DIAGNOSIS:  Closed loop obstruction of the pelvis with small  bowel compromised secondary to adhesions from prior pelvic surgery.   PROCEDURE:  Small bowel resection and enterolysis of adhesions.   SURGEON:  Sandria Bales. Ezzard Standing, M.D.   FIRST ASSISTANT:  None.   ANESTHESIA:  General endotracheal.   ESTIMATED BLOOD LOSS:  100 mL.   INDICATION FOR PROCEDURE:  Ms. Wease is a 77 year old white female who is  a patient of Maxwell Caul, M.D., who has presented with a couple-day  history of increasing abdominal pain.  A CT scan from [facility]  suggested a bowel obstruction.  We tried treating the her with a NG tube, IV  fluids for 36 hours without any improvement of her symptoms.  She now comes  for abdominal exploration.   The indications and potential complications were explained to the patient  and her daughter.  The daughter's name is Marlou Starks.  The indications and  potential complications were explained to the patient.  Potential  complications include but are not limited to bleeding, infection, resection  of the bowel, possible hernia or recurrent bowel obstruction.   OPERATIVE NOTE:  The patient given a gram of Cefoxitin at the initiation of  the procedure, had a Foley catheter in place, NG tube in place, and the  abdomen was prepped with Betadine solution and sterilely draped.   A low midline incision made with sharp dissection and carried down to the  abdominal cavity.  The patient had probably 500 mL more of ascitic fluid  when I got into  the abdomen.  I then got into the pelvis, was able to break  up an adhesive band which I brought up, and she had an approximately 15 cm  loop which was closed-loop down into the pelvis, which was causing the  obstruction.  I milked succus entericus back into the NG tube and got out  about 1200 mL.  I let the bowel rest for about 15 minutes, and it looked  like it may be viable; however, I was concerned because of the lady's age, 77  because of her debilitated state, that if this bowel went on and perforated  that she would not tolerate this, and I thought it was in her best interest,  despite the possible viability of the bowel, to have this bowel resected and  do a primary anastomosis.   Therefore, I took down the mesentery with 3-0 silk suture ligatures.  I then  fired an Financial risk analyst, a 77, as a side-to-side anastomosis, and I  went across this in a T fashion with a TA-60 from [employer] of the blue load.  I then sent the specimen off to pathology.  I then closed the mesenteric  defect, which was  small, with some 3-0 silk suture.  I inverted some of the  staple line with 3-0 silk sutures.  She had a good 2-1/2 to three-  fingerbreadth anastomosis, which was widely patent, and the bowel ends that  I had were viable.   I then irrigated out the abdomen with about 3 L of saline.  The remainder of  the bowel was viable.  Again, I decompressed probably two-thirds of the  succus entericus back into the NG tube.   I then placed some Seprafilm under the incision trying to prevent the small  bowel from sticking to the midline incision.  I then closed the abdominal  wound with two runinng #1 PDS sutures.  I put a couple of internal PDS  sutures as kind of internal retention sutures.   I then closed the skin with skin staples.   The patient tolerated the procedure well.  The sponge and needle count were  correct at the end of the case.  She will be transferred back to the step-  down  unit for observation.      Sandria Bales. Ezzard Standing, M.D.  Electronically Signed     DHN/MEDQ  D:  07/07/2005  T:  07/08/2005  Job:  [id number]   cc:   Lonia Blood, M.D.  Fax: 1   Maxwell Caul, M.D.  [address]  [place]  [place] [address]  Fax: [phone]

## 2011-08-01 LAB — BASIC METABOLIC PANEL
CO2: 26
CO2: 26
Calcium: 9
Chloride: 104
Creatinine, Ser: 0.77
Creatinine, Ser: 0.86
GFR calc Af Amer: >60
GFR calc non Af Amer: >60
Glucose, Bld: 96
Glucose, Bld: 96
Potassium: 4.6

## 2011-08-01 LAB — DIFFERENTIAL
Eosinophils Absolute: 0.1
Eosinophils Relative: 1
Lymphocytes Relative: 19
Monocytes Relative: 8
Neutro Abs: 4.3

## 2011-08-01 LAB — CARDIAC PANEL(CRET KIN+CKTOT+MB+TROPI)
Relative Index: INVALID
Relative Index: INVALID
Total CK: 82
Troponin I: 0.04

## 2011-08-01 LAB — CBC
Hemoglobin: 12.3
MCV: 90.5
MCV: 91
Platelets: 270
RBC: 3.94
RBC: 4.01
RDW: 13
WBC: 5.5

## 2011-08-01 LAB — POCT CARDIAC MARKERS
CKMB, poc: 2.4
Myoglobin, poc: 192
Myoglobin, poc: 47
Operator id: 281221

## 2011-08-01 LAB — LIPID PANEL
Cholesterol: 209 — ABNORMAL HIGH
HDL: 56
LDL Cholesterol: 136 — ABNORMAL HIGH
Total CHOL/HDL Ratio: 3.7
VLDL: 17

## 2011-08-01 LAB — PROTIME-INR
INR: 1.1
Prothrombin Time: 14.1

## 2011-08-02 LAB — ANAEROBIC CULTURE

## 2011-08-02 LAB — COMPREHENSIVE METABOLIC PANEL
ALT: 13
BUN: 28 — ABNORMAL HIGH
CO2: 22
Calcium: 9.1
Creatinine, Ser: 0.96
GFR calc non Af Amer: 56 — ABNORMAL LOW
Glucose, Bld: 165 — ABNORMAL HIGH

## 2011-08-02 LAB — BASIC METABOLIC PANEL
BUN: 22
BUN: 4 — ABNORMAL LOW
BUN: 5 — ABNORMAL LOW
Chloride: 104
Chloride: 105
Creatinine, Ser: 0.66
Creatinine, Ser: 0.69
GFR calc non Af Amer: >60
GFR calc non Af Amer: >60
Glucose, Bld: 119 — ABNORMAL HIGH
Glucose, Bld: 122 — ABNORMAL HIGH
Glucose, Bld: 142 — ABNORMAL HIGH
Glucose, Bld: 93
Potassium: 3.6
Potassium: 3.8
Potassium: 4.1
Sodium: 140

## 2011-08-02 LAB — DIFFERENTIAL
Eosinophils Absolute: 0
Lymphocytes Relative: 8 — ABNORMAL LOW
Lymphs Abs: 0.4 — ABNORMAL LOW
Neutro Abs: 3.4
Neutrophils Relative %: 70

## 2011-08-02 LAB — CBC
HCT: 29.4 — ABNORMAL LOW
HCT: 30.4 — ABNORMAL LOW
HCT: 33.5 — ABNORMAL LOW
HCT: 33.6 — ABNORMAL LOW
HCT: 34.8 — ABNORMAL LOW
Hemoglobin: 10.4 — ABNORMAL LOW
Hemoglobin: 11.3 — ABNORMAL LOW
Hemoglobin: 11.5 — ABNORMAL LOW
Hemoglobin: 12
MCHC: 34.1
MCHC: 34.6
MCV: 86.1
MCV: 86.3
MCV: 87.6
Platelets: 233
Platelets: 280
RBC: 3.9
RBC: 4.04
RDW: 12.1
RDW: 12.8
RDW: 12.8
WBC: 4.4
WBC: 5.7

## 2011-08-02 LAB — URINE MICROSCOPIC-ADD ON

## 2011-08-02 LAB — URINALYSIS, ROUTINE W REFLEX MICROSCOPIC
Nitrite: NEGATIVE
pH: 5.5

## 2011-08-02 LAB — BODY FLUID CULTURE

## 2011-08-02 LAB — LIPASE, BLOOD: Lipase: 25

## 2011-08-02 LAB — URINE CULTURE

## 2011-08-11 LAB — URINALYSIS, ROUTINE W REFLEX MICROSCOPIC
Bilirubin Urine: NEGATIVE
Nitrite: NEGATIVE
Specific Gravity, Urine: 1.01
Urobilinogen, UA: 0.2

## 2011-08-11 LAB — DIFFERENTIAL
Basophils Relative: 1
Eosinophils Absolute: 0
Neutrophils Relative %: 75

## 2011-08-11 LAB — CBC
MCHC: 34.4
MCV: 86.2
Platelets: 299

## 2011-08-11 LAB — BASIC METABOLIC PANEL
BUN: 13
CO2: 24
Chloride: 90 — ABNORMAL LOW
Creatinine, Ser: 0.75

## 2011-08-11 LAB — URINE MICROSCOPIC-ADD ON

## 2011-08-24 LAB — BLOOD GAS, ARTERIAL
Acid-Base Excess: 0.7
Bicarbonate: 23.9
FIO2: 0.21
O2 Saturation: 88.9
Patient temperature: 98.6
TCO2: 21.7

## 2011-08-24 LAB — DIFFERENTIAL
Eosinophils Absolute: 0.1
Eosinophils Relative: 1
Lymphs Abs: 1.3
Monocytes Absolute: 0.8 — ABNORMAL HIGH

## 2011-08-24 LAB — COMPREHENSIVE METABOLIC PANEL
ALT: 11
AST: 16
Albumin: 3.6
CO2: 26
Calcium: 9.3
Chloride: 101
GFR calc Af Amer: >60
GFR calc non Af Amer: >60
Sodium: 137

## 2011-08-24 LAB — CBC
MCHC: 34.2
RBC: 3.9
WBC: 9.4

## 2011-09-18 ENCOUNTER — Other Ambulatory Visit (HOSPITAL_BASED_OUTPATIENT_CLINIC_OR_DEPARTMENT_OTHER): Payer: Self-pay | Admitting: Internal Medicine

## 2011-09-18 DIAGNOSIS — Z780 Asymptomatic menopausal state: Secondary | ICD-10-CM

## 2011-09-18 DIAGNOSIS — Z1231 Encounter for screening mammogram for malignant neoplasm of breast: Secondary | ICD-10-CM

## 2011-10-13 ENCOUNTER — Ambulatory Visit
Admission: RE | Admit: 2011-10-13 | Discharge: 2011-10-13 | Disposition: A | Discharge status: Home / Self Care | Payer: Medicare Other | Source: Ambulatory Visit | Attending: Internal Medicine | Admitting: Internal Medicine

## 2011-10-13 DIAGNOSIS — Z780 Asymptomatic menopausal state: Secondary | ICD-10-CM

## 2011-10-13 DIAGNOSIS — Z1231 Encounter for screening mammogram for malignant neoplasm of breast: Secondary | ICD-10-CM

## 2011-11-20 DIAGNOSIS — I10 Essential (primary) hypertension: Secondary | ICD-10-CM | POA: Diagnosis not present

## 2011-11-20 DIAGNOSIS — D649 Anemia, unspecified: Secondary | ICD-10-CM | POA: Diagnosis not present

## 2011-11-27 DIAGNOSIS — D649 Anemia, unspecified: Secondary | ICD-10-CM | POA: Diagnosis not present

## 2011-12-13 DIAGNOSIS — E785 Hyperlipidemia, unspecified: Secondary | ICD-10-CM | POA: Diagnosis not present

## 2011-12-13 DIAGNOSIS — H6120 Impacted cerumen, unspecified ear: Secondary | ICD-10-CM | POA: Diagnosis not present

## 2011-12-13 DIAGNOSIS — D62 Acute posthemorrhagic anemia: Secondary | ICD-10-CM | POA: Diagnosis not present

## 2011-12-13 DIAGNOSIS — B0229 Other postherpetic nervous system involvement: Secondary | ICD-10-CM | POA: Diagnosis not present

## 2011-12-13 DIAGNOSIS — R634 Abnormal weight loss: Secondary | ICD-10-CM | POA: Diagnosis not present

## 2011-12-18 DIAGNOSIS — D649 Anemia, unspecified: Secondary | ICD-10-CM | POA: Diagnosis not present

## 2011-12-18 DIAGNOSIS — D62 Acute posthemorrhagic anemia: Secondary | ICD-10-CM | POA: Diagnosis not present

## 2011-12-18 DIAGNOSIS — Z23 Encounter for immunization: Secondary | ICD-10-CM | POA: Diagnosis not present

## 2011-12-18 DIAGNOSIS — M810 Age-related osteoporosis without current pathological fracture: Secondary | ICD-10-CM | POA: Diagnosis not present

## 2011-12-26 ENCOUNTER — Emergency Department (HOSPITAL_COMMUNITY): Payer: Medicare Other

## 2011-12-26 ENCOUNTER — Emergency Department (HOSPITAL_COMMUNITY)
Admission: EM | Admit: 2011-12-26 | Discharge: 2011-12-26 | Disposition: A | Discharge status: Home / Self Care | Payer: Medicare Other | Attending: Emergency Medicine | Admitting: Emergency Medicine

## 2011-12-26 DIAGNOSIS — M25559 Pain in unspecified hip: Secondary | ICD-10-CM | POA: Diagnosis not present

## 2011-12-26 DIAGNOSIS — W19XXXA Unspecified fall, initial encounter: Secondary | ICD-10-CM

## 2011-12-26 DIAGNOSIS — X58XXXA Exposure to other specified factors, initial encounter: Secondary | ICD-10-CM | POA: Diagnosis not present

## 2011-12-26 DIAGNOSIS — M4120 Other idiopathic scoliosis, site unspecified: Secondary | ICD-10-CM | POA: Diagnosis not present

## 2011-12-26 DIAGNOSIS — M5137 Other intervertebral disc degeneration, lumbosacral region: Secondary | ICD-10-CM | POA: Diagnosis not present

## 2011-12-26 NOTE — ED Notes (Signed)
Urine collected and placed at bedside.  

## 2011-12-26 NOTE — ED Notes (Signed)
ZOX:WR60<AV> Expected date:12/26/11<BR> Expected time: 5:23 PM<BR> Means of arrival:Ambulance<BR> Comments:<BR> EMS 40 GC, 86 yof fall w R hip pain

## 2011-12-26 NOTE — ED Notes (Signed)
Pt lives in assisted living. Ambulates with walker. Pt let go of walker, lost balance and fell. Mild left hip pain without evidence of shortening, deformity or rotation.

## 2011-12-26 NOTE — Discharge Instructions (Signed)
Fall Prevention, Elderly Falls are the leading cause of injuries, accidents, and accidental deaths in people over the age of 65. Falling is a real threat to your ability to live on your own. CAUSES   Poor eyesight or poor hearing can make you more likely to fall.   Illnesses and physical conditions can affect your strength and balance.   Poor lighting, throw rugs and pets in your home can make you more likely to trip or slip.   The side effects of some medicines can upset your balance and lead to falling. These include medicines for depression, sleep problems, high blood pressure, diabetes, and heart conditions.  PREVENTION  Be sure your home is as safe as possible. Here are some tips:  Wear shoes with non-skid soles (not house slippers).   Be sure your home and outside area are well lit.   Use night lights throughout your house, including hallways and stairways.   Remove clutter and clean up spills on floors and walkways.   Remove throw rugs or fasten them to the floor with carpet tape. Tack down carpet edges.   Do not place electrical cords across pathways.   Install grab bars in your bathtub, shower, and toilet area. Towel bars should not be used as a grab bar.   Install handrails on both sides of stairways.   Do not climb on stools or stepladders. Get someone else to help with jobs that require climbing.   Do not wax your floors at all, or use a non-skid wax.   Repair uneven or unsafe sidewalks, walkways or stairs.   Keep frequently used items within reach.   Be aware of pets so you do not trip.  Get regular check-ups from your doctor, and take good care of yourself:  Have your eyes checked every year for vision changes, cataracts, glaucoma, and other eye problems. Wear eyeglasses as directed.   Have your hearing checked every 2 years, or anytime you or others think that you cannot hear well. Use hearing aids as directed.   See your caregiver if you have foot pain or  corns. Sore feet can contribute to falls.   Let your caregiver know if a medicine is making you feel dizzy or making you lose your balance.   Use a cane, walker, or wheelchair as directed. Use walker or wheelchair brakes when getting in and out.   When you get up from bed, sit on the side of the bed for 1 to 2 minutes before you stand up. This will give your blood pressure time to adjust, and you will feel less dizzy.   If you need to go to the bathroom often, consider using a bedside commode.  Keep your body in good shape:  Get regular exercise, especially walking.   Do exercises to strengthen the muscles you use for walking and lifting.   Do not smoke.   Minimize use of alcohol.  SEEK IMMEDIATE MEDICAL CARE IF:   You feel dizzy, weak, or unsteady on your feet.   You feel confused.   You fall.  Document Released: 10/23/2005 Document Revised: 07/05/2011 Document Reviewed: 04/19/2007 [employer] Patient Information 2012 [employer]. 

## 2011-12-26 NOTE — ED Provider Notes (Addendum)
History     CSN: [id number]  Arrival date & time 12/26/11  1732   First MD Initiated Contact with Patient 12/26/11 1753      No chief complaint on file.   (Consider location/radiation/quality/duration/timing/severity/associated sxs/prior treatment) The history is provided by the patient.   Patient here after falling at the assisted-living facility. Patient does normally use a walker and lost her balance. She is able to walk does have slight pain to the hip. No shortening or rotation noted. Denies any loss of consciousness or head injury. Denies any dizziness prior to the event. Has been at her baseline state of health No past medical history on file.  No past surgical history on file.  No family history on file.  History  Substance Use Topics  . Smoking status: Not on file  . Smokeless tobacco: Not on file  . Alcohol Use: Not on file    OB History    Grav Para Term Preterm Abortions TAB SAB Ect Mult Living                  Review of Systems  All other systems reviewed and are negative.    Allergies  Celebrex and Parafon forte dsc  Home Medications  No current outpatient prescriptions on file.  BP 180/91  Pulse 92  Temp(Src) 97.9 F (36.6 C) (Oral)  Resp 19  SpO2 94%  Physical Exam  Vitals reviewed. Constitutional: She is oriented to person, place, and time. She appears well-developed and well-nourished.  Non-toxic appearance. No distress.  HENT:  Head: Normocephalic and atraumatic.  Eyes: Conjunctivae, EOM and lids are normal. Pupils are equal, round, and reactive to light.  Neck: Normal range of motion. Neck supple. No tracheal deviation present. No mass present.  Cardiovascular: Normal rate, regular rhythm and normal heart sounds.  Exam reveals no gallop.   No murmur heard. Pulmonary/Chest: Effort normal and breath sounds normal. No stridor. No respiratory distress. She has no decreased breath sounds. She has no wheezes. She has no rhonchi. She has no  rales.  Abdominal: Soft. Normal appearance and bowel sounds are normal. She exhibits no distension. There is no tenderness. There is no rebound and no CVA tenderness.  Musculoskeletal: Normal range of motion. She exhibits no edema and no tenderness.       Bilateral hips with full range of motion, pain with movement of right lower. No shortening or rotation noted  Neurological: She is alert and oriented to person, place, and time. She has normal strength. No cranial nerve deficit or sensory deficit. GCS eye subscore is 4. GCS verbal subscore is 5. GCS motor subscore is 6.  Skin: Skin is warm and dry. No abrasion and no rash noted.  Psychiatric: She has a normal mood and affect. Her speech is normal and behavior is normal.    ED Course  Procedures (including critical care time)  Labs Reviewed - No data to display No results found.   No diagnosis found.    MDM  Patient's x-rays negative. Patient be discharged        Toy Baker, MD 12/26/11 1945  Toy Baker, MD 02/07/12 [phone]

## 2012-03-11 ENCOUNTER — Other Ambulatory Visit (HOSPITAL_BASED_OUTPATIENT_CLINIC_OR_DEPARTMENT_OTHER): Payer: Self-pay | Admitting: Internal Medicine

## 2012-03-11 DIAGNOSIS — R1013 Epigastric pain: Secondary | ICD-10-CM | POA: Diagnosis not present

## 2012-03-11 DIAGNOSIS — I10 Essential (primary) hypertension: Secondary | ICD-10-CM | POA: Diagnosis not present

## 2012-03-15 ENCOUNTER — Ambulatory Visit
Admission: RE | Admit: 2012-03-15 | Discharge: 2012-03-15 | Disposition: A | Discharge status: Home / Self Care | Payer: Medicare Other | Source: Ambulatory Visit | Attending: Internal Medicine | Admitting: Internal Medicine

## 2012-03-15 DIAGNOSIS — R1013 Epigastric pain: Secondary | ICD-10-CM | POA: Diagnosis not present

## 2012-03-18 DIAGNOSIS — D649 Anemia, unspecified: Secondary | ICD-10-CM | POA: Diagnosis not present

## 2012-03-18 DIAGNOSIS — R1084 Generalized abdominal pain: Secondary | ICD-10-CM | POA: Diagnosis not present

## 2012-03-28 DIAGNOSIS — Z961 Presence of intraocular lens: Secondary | ICD-10-CM | POA: Diagnosis not present

## 2012-03-28 DIAGNOSIS — H4011X0 Primary open-angle glaucoma, stage unspecified: Secondary | ICD-10-CM | POA: Diagnosis not present

## 2012-03-28 DIAGNOSIS — H4720 Unspecified optic atrophy: Secondary | ICD-10-CM | POA: Diagnosis not present

## 2012-03-28 DIAGNOSIS — H31019 Macula scars of posterior pole (postinflammatory) (post-traumatic), unspecified eye: Secondary | ICD-10-CM | POA: Diagnosis not present

## 2012-03-28 DIAGNOSIS — H353190 Nonexudative age-related macular degeneration, unspecified eye, stage unspecified: Secondary | ICD-10-CM | POA: Diagnosis not present

## 2012-04-15 DIAGNOSIS — H4011X0 Primary open-angle glaucoma, stage unspecified: Secondary | ICD-10-CM | POA: Diagnosis not present

## 2012-04-24 DIAGNOSIS — D518 Other vitamin B12 deficiency anemias: Secondary | ICD-10-CM | POA: Diagnosis not present

## 2012-04-24 DIAGNOSIS — D649 Anemia, unspecified: Secondary | ICD-10-CM | POA: Diagnosis not present

## 2012-04-29 DIAGNOSIS — D649 Anemia, unspecified: Secondary | ICD-10-CM | POA: Diagnosis not present

## 2012-05-20 DIAGNOSIS — D649 Anemia, unspecified: Secondary | ICD-10-CM | POA: Diagnosis not present

## 2012-05-20 DIAGNOSIS — R634 Abnormal weight loss: Secondary | ICD-10-CM | POA: Diagnosis not present

## 2012-08-02 DIAGNOSIS — Z23 Encounter for immunization: Secondary | ICD-10-CM | POA: Diagnosis not present

## 2012-09-18 DIAGNOSIS — H409 Unspecified glaucoma: Secondary | ICD-10-CM | POA: Diagnosis not present

## 2012-09-18 DIAGNOSIS — H4011X0 Primary open-angle glaucoma, stage unspecified: Secondary | ICD-10-CM | POA: Diagnosis not present

## 2012-09-23 DIAGNOSIS — D649 Anemia, unspecified: Secondary | ICD-10-CM | POA: Diagnosis not present

## 2013-02-13 ENCOUNTER — Encounter: Payer: Self-pay | Admitting: Internal Medicine

## 2013-02-13 ENCOUNTER — Encounter: Payer: Self-pay | Admitting: Geriatric Medicine

## 2013-02-13 ENCOUNTER — Ambulatory Visit (INDEPENDENT_AMBULATORY_CARE_PROVIDER_SITE_OTHER): Payer: Medicare Other | Admitting: Internal Medicine

## 2013-02-13 VITALS — BP 148/80 | HR 68 | Temp 97.6°F | Resp 15 | Ht 61.5 in | Wt 104.2 lb

## 2013-02-13 DIAGNOSIS — M1610 Unilateral primary osteoarthritis, unspecified hip: Secondary | ICD-10-CM | POA: Diagnosis not present

## 2013-02-13 DIAGNOSIS — I10 Essential (primary) hypertension: Secondary | ICD-10-CM | POA: Insufficient documentation

## 2013-02-13 DIAGNOSIS — F329 Major depressive disorder, single episode, unspecified: Secondary | ICD-10-CM

## 2013-02-13 DIAGNOSIS — F32A Depression, unspecified: Secondary | ICD-10-CM | POA: Insufficient documentation

## 2013-02-13 DIAGNOSIS — M1611 Unilateral primary osteoarthritis, right hip: Secondary | ICD-10-CM | POA: Insufficient documentation

## 2013-02-13 DIAGNOSIS — D509 Iron deficiency anemia, unspecified: Secondary | ICD-10-CM | POA: Diagnosis not present

## 2013-02-13 DIAGNOSIS — E039 Hypothyroidism, unspecified: Secondary | ICD-10-CM | POA: Insufficient documentation

## 2013-02-13 DIAGNOSIS — F3289 Other specified depressive episodes: Secondary | ICD-10-CM | POA: Diagnosis not present

## 2013-02-13 DIAGNOSIS — K2970 Gastritis, unspecified, without bleeding: Secondary | ICD-10-CM

## 2013-02-13 DIAGNOSIS — M810 Age-related osteoporosis without current pathological fracture: Secondary | ICD-10-CM | POA: Diagnosis not present

## 2013-02-13 DIAGNOSIS — K449 Diaphragmatic hernia without obstruction or gangrene: Secondary | ICD-10-CM

## 2013-02-13 DIAGNOSIS — M169 Osteoarthritis of hip, unspecified: Secondary | ICD-10-CM

## 2013-02-13 DIAGNOSIS — K2990 Gastroduodenitis, unspecified, without bleeding: Secondary | ICD-10-CM

## 2013-02-13 HISTORY — DX: Gastritis, unspecified, without bleeding: K29.70

## 2013-02-13 MED ORDER — DULOXETINE HCL 30 MG PO CPEP: 30.0000 mg | ORAL_CAPSULE | Freq: Every day | ORAL | Status: DC

## 2013-02-13 MED ORDER — DULOXETINE HCL 60 MG PO CPEP: 60.0000 mg | ORAL_CAPSULE | Freq: Every day | ORAL | Status: DC

## 2013-02-13 NOTE — Progress Notes (Signed)
Patient ID: Jane [name], female   DOB: January 05, 1925, 79 y.o.   MRN: [id number] Code Status: Daughter is HCPOA.  I need to address code status with her.  This is the first visit I've had with her.  Allergies  Allergen Reactions  . Celebrex (Celecoxib)   . Chlorzoxazone     Chief Complaint  Patient presents with  . Annual Exam  . Back Pain  . Shoulder Pain    HPI: Patient is a 79 y.o. white female seen in the office today for annual visit.  Had been following with Dr. Leanord Hawking.  Lately, just hasn't been doing. Is tired, back and shoulders and neck hurt and sciatica in left leg bothers her.  Cannot be repaired due to proximity to spine of sciatic nerve.  Had fallen a couple of years ago and sciatic nerve being compressed.    Eyes bother her.  Has macular degeneration and glaucoma.  Had cataracts removed.  MD is slowly progressing.   Sleeps only with sleeping pills.  When at [facility] and doing activities, is happy and has a good time, but if slightest thing goes wrong, will get extremely depressed.  Has been on zoloft for a long time.  Daughter wonders if change in med would help.  Does not have much of an appetite.  Doesn't really like the food at her facility.  Has breakfast in her apt.  Doesn't like to dress up when she awakens.  Eats small snack in room if she doesn't like the dinner or supper.    Denies acid reflux problems recently.  Dr. Leanord Hawking talked about resuming bisphosphonate if stomach ok.  Does take chewable calcium every day.  Discussed using reclast now for osteoporosis.    Review of Systems:  Review of Systems  Constitutional: Positive for malaise/fatigue. Negative for fever.  HENT: Positive for neck pain. Negative for congestion.   Eyes: Positive for blurred vision.  Respiratory: Negative for cough, sputum production and shortness of breath.   Cardiovascular: Negative for chest pain, palpitations, orthopnea and PND.  Gastrointestinal: Negative for heartburn and  abdominal pain.  Genitourinary: Negative for dysuria.  Musculoskeletal: Positive for back pain and joint pain. Negative for falls.  Skin: Negative for rash.  Neurological: Positive for weakness. Negative for dizziness, focal weakness and headaches.  Endo/Heme/Allergies: Does not bruise/bleed easily.  Psychiatric/Behavioral: Positive for depression. Negative for memory loss. The patient is not nervous/anxious.      Past Medical History  Diagnosis Date  . Abdominal pain, epigastric   . Impacted cerumen   . Abnormality of gait   . Anal fissure   . Loss of weight   . Hyposmolality and/or hyponatremia   . Pain in limb   . Other vitamin B12 deficiency anemia   . Chest pain, unspecified   . Anemia, unspecified   . Anxiety state, unspecified   . Abdominal pain, right upper quadrant   . Other pulmonary embolism and infarction   . Intestinal or peritoneal adhesions with obstruction (postoperative) (postinfection)   . Long term (current) use of anticoagulants   . Abdominal pain, right lower quadrant   . Essential and other specified forms of tremor   . Lumbago   . Unspecified hypothyroidism   . Major depressive disorder, single episode, unspecified   . Other and unspecified hyperlipidemia   . Macular degeneration (senile) of retina, unspecified   . Unspecified glaucoma(365.9)   . Unspecified cataract   . Unspecified hearing loss   . Unspecified essential hypertension   .  Osteoarthrosis, unspecified whether generalized or localized, unspecified site   . Osteoporosis, unspecified   . Abnormal involuntary movements    Past Surgical History  Procedure Laterality Date  . Excisional hemorrhoidectomy  1980  . Bladder suspension  1993  . Back surgery  2003  . Abdominal hysterectomy     Social History:   reports that she has quit smoking. Her smoking use included Cigarettes. She smoked 0.00 packs per day for 40 years. She does not have any smokeless tobacco history on file. She reports  that she does not drink alcohol or use illicit drugs.  Family History  Problem Relation Age of Onset  . Congestive Heart Failure Mother   . Cancer Sister   . Arthritis Daughter   . Heart disease Daughter     Medications: Patient's Medications  New Prescriptions   DULOXETINE (CYMBALTA) 30 MG CAPSULE    Take 1 capsule (30 mg total) by mouth daily.   DULOXETINE (CYMBALTA) 60 MG CAPSULE    Take 1 capsule (60 mg total) by mouth daily.  Previous Medications   ALBUTEROL (PROVENTIL HFA;VENTOLIN HFA) 108 (90 BASE) MCG/ACT INHALER    Inhale 2 puffs into the lungs every 6 (six) hours as needed. Shortness of breath.   AMLODIPINE (NORVASC) 5 MG TABLET    Take 5 mg by mouth daily.   DICYCLOMINE (BENTYL) 10 MG CAPSULE    Take 10 mg by mouth at bedtime.   HYDROCORTISONE (ANUSOL-HC) 2.5 % RECTAL CREAM    Place 1 application rectally daily as needed.   LATANOPROST (XALATAN) 0.005 % OPHTHALMIC SOLUTION    Place 1 drop into both eyes at bedtime.   LEVOTHYROXINE (SYNTHROID, LEVOTHROID) 75 MCG TABLET    Take 75 mcg by mouth daily.   LISINOPRIL (PRINIVIL,ZESTRIL) 40 MG TABLET    Take 40 mg by mouth daily.   LORAZEPAM (ATIVAN) 0.5 MG TABLET    Take 1 mg by mouth at bedtime.   LOVASTATIN (MEVACOR) 20 MG TABLET    Take 20 mg by mouth daily.   MULTIPLE VITAMINS-MINERALS (ICAPS LUTEIN & OMEGA-3) CAPS    Take 1 capsule by mouth daily.   VITAMIN B-12 (CYANOCOBALAMIN) 250 MCG TABLET    Take 250 mcg by mouth daily.  Modified Medications   No medications on file  Discontinued Medications   FERROUS SULFATE 325 (65 FE) MG TABLET    Take 325 mg by mouth every other day.   PANTOPRAZOLE (PROTONIX) 40 MG TABLET    Take 40 mg by mouth every other day.   SERTRALINE (ZOLOFT) 100 MG TABLET    Take 100 mg by mouth daily.     Physical Exam: Filed Vitals:   02/13/13 0904  BP: 148/80  Pulse: 68  Temp: 97.6 F (36.4 C)  TempSrc: Oral  Resp: 15  Height: 5' 1.5" (1.562 m)  Weight: 104 lb 3.2 oz (47.265 kg)  Physical  Exam  Constitutional: She is oriented to person, place, and time.  Frail Caucasian female, NAD  HENT:  Head: Normocephalic and atraumatic.  Right Ear: External ear normal.  Left Ear: External ear normal.  Nose: Nose normal.  Mouth/Throat: Oropharynx is clear and moist. No oropharyngeal exudate.  Eyes: EOM are normal. Pupils are equal, round, and reactive to light.  Neck: Normal range of motion. No JVD present.  Cardiovascular: Normal rate, regular rhythm, normal heart sounds and intact distal pulses.   Pulmonary/Chest: Effort normal and breath sounds normal.  Abdominal: Soft. Bowel sounds are normal.  Musculoskeletal: She  exhibits tenderness. She exhibits no edema.  Decreased ROM b/l shoulders  Neurological: She is alert and oriented to person, place, and time. She has normal reflexes. No cranial nerve deficit. Coordination normal.  Skin: Skin is warm and dry.  Psychiatric: She has a normal mood and affect. Her behavior is normal. Judgment and thought content normal.   Labs reviewed: 12/14/2011 Lipid Panel; Cholesterol 163, Triglycerides 96, HDL 62, LDL 82 12/18/2011  CBC: wbc 5.1, rbc 4.04, Hemoglobin 10.3 Retic: 1.0 03/11/2012  CBC: wbc 5.0, rbc 4.21, Hemoglobin 12.3 CMP: glucose 85, BUN 15, Creatinine 0.70, Potassium 5.5 Lipase: 28 04/24/2012 CBC Wbc 5.7 Rbc 4.14 Hemoglobin 12.6  Past Procedures: 07/05/2005 CT of Abdomen  Small bowel obstruction. Ascites  07/05/2005 CT of Pelvis Distal Small bowel obstruction  08/11/2005 CT of Abdomen and Pelvis Resolved small bowel obstructive pattern and ascites  08/30/2006 Chest  X-Ray Mild Cardiomegaly. No active disease  08/30/2006 Left Lateral Ribs Osteopenia without fracture    2008-MRI-Back 2008 Untwist intestines  03/15/2012 Ultrasound of the Abdomen Slight dilation of the right pelvocaliceal system. Mild right hydronephrosis can not be excluded No gallstones   Assessment/Plan Essential hypertension, benign BP given age and fall risk is  at goal.  No changes needed.    Hiatal hernia Due to this and her gastritis, she has not tolerated oral bisphosphonates (have been tried on 2 occasions w/ severe reflux symptoms).    Gastritis Not bothersome without bisphosphonates.  ? Cameron erosions with her hiatal hernia.  Hypothyroidism Cont synthroid daily--reviewed taking separately from other meds on empty stomach.  Last TSH slightly low.  Will recheck next visit.  Senile osteoporosis Has not tolerated oral bisphosphonates and she is quite frail.  Will refer her for a reclast infusion.  Labs to be obtained today:  Bmp, mg, phos  Osteoarthritis of right hip Chronic.  Reviewed use of extra strength tylenol, heat, ice, topical agents to help with this.  Walking encouraged.    Depressive disorder, not elsewhere classified Taper off zoloft and then begin cymbalta--this may also help her low back pain.    Anemia, iron deficiency F/u cbc today. Has stopped iron due to constipation.     Labs/tests ordered:  Cbc, bmp, mg, phos, reclast infusion.

## 2013-02-13 NOTE — Patient Instructions (Addendum)
Decrease zoloft to 50mg  once daily (1/2 tab) for one week, then stop Begin cymbalta 30mg  daily for 2 weeks Then increase cymbalta to 60mg  daily thereafter  You will here from staff about a reclast appointment for bone strength at the infusion center after your labs return

## 2013-02-14 LAB — CBC WITH DIFFERENTIAL/PLATELET
Basophils Absolute: 0 10*3/uL (ref 0.0–0.2)
Basos: 0 % (ref 0–3)
Eos: 2 % (ref 0–5)
Eosinophils Absolute: 0.2 10*3/uL (ref 0.0–0.4)
HCT: 37 % (ref 34.0–46.6)
Hemoglobin: 12.7 g/dL (ref 11.1–15.9)
Immature Grans (Abs): 0 10*3/uL (ref 0.0–0.1)
Immature Granulocytes: 0 % (ref 0–2)
Lymphocytes Absolute: 1.1 10*3/uL (ref 0.7–3.1)
Lymphs: 14 % (ref 14–46)
MCH: 31 pg (ref 26.6–33.0)
MCHC: 34.3 g/dL (ref 31.5–35.7)
MCV: 90 fL (ref 79–97)
Monocytes Absolute: 0.6 10*3/uL (ref 0.1–0.9)
Monocytes: 7 % (ref 4–12)
Neutrophils Absolute: 6.1 10*3/uL (ref 1.4–7.0)
Neutrophils Relative %: 77 % — ABNORMAL HIGH (ref 40–74)
RBC: 4.1 x10E6/uL (ref 3.77–5.28)
RDW: 13 % (ref 12.3–15.4)
WBC: 7.9 10*3/uL (ref 3.4–10.8)

## 2013-02-14 LAB — BASIC METABOLIC PANEL
BUN/Creatinine Ratio: 24 (ref 11–26)
BUN: 17 mg/dL (ref 8–27)
CO2: 26 mmol/L (ref 19–28)
Calcium: 9.7 mg/dL (ref 8.6–10.2)
Chloride: 103 mmol/L (ref 97–108)
Creatinine, Ser: 0.7 mg/dL (ref 0.57–1.00)
GFR calc Af Amer: 91 mL/min/{1.73_m2} (ref >59)
GFR calc non Af Amer: 79 mL/min/{1.73_m2} (ref >59)
Glucose: 89 mg/dL (ref 65–99)
Potassium: 4.1 mmol/L (ref 3.5–5.2)
Sodium: 141 mmol/L (ref 134–144)

## 2013-02-14 LAB — TSH: TSH: 0.233 u[IU]/mL — ABNORMAL LOW (ref 0.450–4.500)

## 2013-02-14 LAB — MAGNESIUM: Magnesium: 2.1 mg/dL (ref 1.6–2.6)

## 2013-02-14 LAB — PHOSPHORUS: Phosphorus: 3.8 mg/dL (ref 2.5–4.5)

## 2013-03-03 ENCOUNTER — Telehealth: Payer: Self-pay | Admitting: Geriatric Medicine

## 2013-03-03 NOTE — Assessment & Plan Note (Signed)
Taper off zoloft and then begin cymbalta--this may also help her low back pain.

## 2013-03-03 NOTE — Assessment & Plan Note (Signed)
Has not tolerated oral bisphosphonates and she is quite frail.  Will refer her for a reclast infusion.  Labs to be obtained today:  Bmp, mg, phos

## 2013-03-03 NOTE — Assessment & Plan Note (Signed)
Chronic.  Reviewed use of extra strength tylenol, heat, ice, topical agents to help with this.  Walking encouraged.

## 2013-03-03 NOTE — Telephone Encounter (Signed)
Message copied by Wynetta Fines on Mon Mar 03, 2013  3:52 PM ------      Message from: [place], [place] L      Created: Mon Mar 03, 2013  9:47 AM       Please call pt and request her to begin caltrate with D twice daily--she must be taking this regularly in order to qualify for the reclast infusion. ------

## 2013-03-03 NOTE — Assessment & Plan Note (Signed)
Not bothersome without bisphosphonates.  ? Cameron erosions with her hiatal hernia.

## 2013-03-03 NOTE — Assessment & Plan Note (Signed)
Cont synthroid daily--reviewed taking separately from other meds on empty stomach.  Last TSH slightly low.  Will recheck next visit.

## 2013-03-03 NOTE — Assessment & Plan Note (Signed)
BP given age and fall risk is at goal.  No changes needed.

## 2013-03-03 NOTE — Assessment & Plan Note (Signed)
F/u cbc today. Has stopped iron due to constipation.

## 2013-03-03 NOTE — Assessment & Plan Note (Signed)
Due to this and her gastritis, she has not tolerated oral bisphosphonates (have been tried on 2 occasions w/ severe reflux symptoms).

## 2013-03-03 NOTE — Telephone Encounter (Signed)
I left message for patient to call me back to inform her that she needs to take caltrate plus D twice daily.

## 2013-03-24 ENCOUNTER — Other Ambulatory Visit: Payer: Self-pay | Admitting: Nurse Practitioner

## 2013-03-25 ENCOUNTER — Other Ambulatory Visit: Payer: Self-pay | Admitting: Internal Medicine

## 2013-03-26 ENCOUNTER — Other Ambulatory Visit: Payer: Self-pay | Admitting: Geriatric Medicine

## 2013-03-26 ENCOUNTER — Other Ambulatory Visit: Payer: Medicare Other

## 2013-03-26 ENCOUNTER — Other Ambulatory Visit: Payer: Self-pay | Admitting: *Deleted

## 2013-03-26 DIAGNOSIS — E039 Hypothyroidism, unspecified: Secondary | ICD-10-CM

## 2013-03-26 MED ORDER — LORAZEPAM 0.5 MG PO TABS: ORAL_TABLET | ORAL | Status: DC

## 2013-03-27 LAB — TSH: TSH: 3.85 u[IU]/mL (ref 0.450–4.500)

## 2013-04-02 DIAGNOSIS — H43399 Other vitreous opacities, unspecified eye: Secondary | ICD-10-CM | POA: Diagnosis not present

## 2013-04-02 DIAGNOSIS — Z961 Presence of intraocular lens: Secondary | ICD-10-CM | POA: Diagnosis not present

## 2013-04-02 DIAGNOSIS — H4011X0 Primary open-angle glaucoma, stage unspecified: Secondary | ICD-10-CM | POA: Diagnosis not present

## 2013-04-02 DIAGNOSIS — H26499 Other secondary cataract, unspecified eye: Secondary | ICD-10-CM | POA: Diagnosis not present

## 2013-04-02 DIAGNOSIS — H353190 Nonexudative age-related macular degeneration, unspecified eye, stage unspecified: Secondary | ICD-10-CM | POA: Diagnosis not present

## 2013-04-02 DIAGNOSIS — H01009 Unspecified blepharitis unspecified eye, unspecified eyelid: Secondary | ICD-10-CM | POA: Diagnosis not present

## 2013-04-07 ENCOUNTER — Other Ambulatory Visit: Payer: Self-pay | Admitting: Geriatric Medicine

## 2013-04-07 DIAGNOSIS — I10 Essential (primary) hypertension: Secondary | ICD-10-CM

## 2013-04-14 ENCOUNTER — Other Ambulatory Visit: Payer: Medicare Other

## 2013-04-14 DIAGNOSIS — I10 Essential (primary) hypertension: Secondary | ICD-10-CM | POA: Diagnosis not present

## 2013-04-15 LAB — COMPREHENSIVE METABOLIC PANEL
ALT: 14 IU/L (ref 0–32)
AST: 20 IU/L (ref 0–40)
Albumin/Globulin Ratio: 2.4 (ref 1.1–2.5)
Albumin: 4.3 g/dL (ref 3.5–4.7)
Alkaline Phosphatase: 70 IU/L (ref 39–117)
BUN/Creatinine Ratio: 21 (ref 11–26)
BUN: 16 mg/dL (ref 8–27)
CO2: 26 mmol/L (ref 19–28)
Calcium: 10.2 mg/dL (ref 8.6–10.2)
Chloride: 100 mmol/L (ref 97–108)
Creatinine, Ser: 0.76 mg/dL (ref 0.57–1.00)
GFR calc Af Amer: 83 mL/min/{1.73_m2} (ref >59)
GFR calc non Af Amer: 72 mL/min/{1.73_m2} (ref >59)
Globulin, Total: 1.8 g/dL (ref 1.5–4.5)
Glucose: 97 mg/dL (ref 65–99)
Potassium: 4.5 mmol/L (ref 3.5–5.2)
Sodium: 141 mmol/L (ref 134–144)
Total Bilirubin: 0.3 mg/dL (ref 0.0–1.2)
Total Protein: 6.1 g/dL (ref 6.0–8.5)

## 2013-04-28 ENCOUNTER — Other Ambulatory Visit: Payer: Self-pay | Admitting: Internal Medicine

## 2013-04-28 ENCOUNTER — Other Ambulatory Visit (HOSPITAL_BASED_OUTPATIENT_CLINIC_OR_DEPARTMENT_OTHER): Payer: Self-pay | Admitting: Internal Medicine

## 2013-05-19 ENCOUNTER — Encounter: Payer: Self-pay | Admitting: Internal Medicine

## 2013-05-19 ENCOUNTER — Ambulatory Visit (INDEPENDENT_AMBULATORY_CARE_PROVIDER_SITE_OTHER): Payer: Medicare Other | Admitting: Internal Medicine

## 2013-05-19 VITALS — BP 152/78 | HR 86 | Temp 97.4°F | Resp 18 | Ht 61.0 in | Wt 109.2 lb

## 2013-05-19 DIAGNOSIS — K5900 Constipation, unspecified: Secondary | ICD-10-CM

## 2013-05-19 DIAGNOSIS — J4489 Other specified chronic obstructive pulmonary disease: Secondary | ICD-10-CM | POA: Diagnosis not present

## 2013-05-19 DIAGNOSIS — J449 Chronic obstructive pulmonary disease, unspecified: Secondary | ICD-10-CM | POA: Diagnosis not present

## 2013-05-19 DIAGNOSIS — K5909 Other constipation: Secondary | ICD-10-CM

## 2013-05-19 MED ORDER — ALBUTEROL SULFATE HFA 108 (90 BASE) MCG/ACT IN AERS: 2.0000 | INHALATION_SPRAY | Freq: Four times a day (QID) | RESPIRATORY_TRACT | Status: DC | PRN

## 2013-05-19 NOTE — Progress Notes (Signed)
Patient ID: Jane [name], female   DOB: [birth year], 79 y.o.   MRN: [id number] Location:  [facility]  Code Status: DNR  Allergies  Allergen Reactions  . Celebrex (Celecoxib)   . Chlorzoxazone     Chief Complaint  Patient presents with  . Follow-up    bowell issues,meds refill    HPI: Patient is a 79 y.o. white female seen in the office today for f/u of chronic conditions.    Daughter suggests prune laxative qhs--will go the next day, but doesn't want to take regularly.  If pushes too hard, hernia hurts.  Says her daughter thinks she's ok.  Thinks her constipation is worse.  Is taking cymbalta every other day right now.  Eats cereal, oats, cheerios.  Doesn't eat much lettuce.  Does eat carrots, squash, zucchini.  Doesn't drink enough fluids with her fiber.  Drinking POM mango juice--can drink 12 oz, but has only sometimes, and 1/2 cup coffee doesn't make her go.  Walks a lot.    Has little bumps inside vagina like nothing she's had before.  They don't bleed or hurt, but present.  Once in a while, they itch.  No discharge.    Review of Systems:  Review of Systems  Constitutional: Negative for fever.  Eyes: Negative for blurred vision.  Respiratory: Negative for cough and shortness of breath.   Cardiovascular: Negative for chest pain and leg swelling.  Gastrointestinal: Positive for diarrhea and constipation. Negative for heartburn, abdominal pain, blood in stool and melena.  Genitourinary: Negative for dysuria.  Musculoskeletal: Negative for falls and myalgias.  Neurological: Negative for dizziness and headaches.  Endo/Heme/Allergies: Bruises/bleeds easily.  Psychiatric/Behavioral: Positive for depression, hallucinations and memory loss. The patient is nervous/anxious.     Past Medical History  Diagnosis Date  . Abdominal pain, epigastric   . Impacted cerumen   . Abnormality of gait   . Anal fissure   . Loss of weight   .  Hyposmolality and/or hyponatremia   . Pain in limb   . Other vitamin B12 deficiency anemia   . Chest pain, unspecified   . Anemia, unspecified   . Anxiety state, unspecified   . Abdominal pain, right upper quadrant   . Other pulmonary embolism and infarction   . Intestinal or peritoneal adhesions with obstruction (postoperative) (postinfection)   . Long term (current) use of anticoagulants   . Abdominal pain, right lower quadrant   . Essential and other specified forms of tremor   . Lumbago   . Unspecified hypothyroidism   . Major depressive disorder, single episode, unspecified   . Other and unspecified hyperlipidemia   . Macular degeneration (senile) of retina, unspecified   . Unspecified glaucoma(365.9)   . Unspecified cataract   . Unspecified hearing loss   . Unspecified essential hypertension   . Osteoarthrosis, unspecified whether generalized or localized, unspecified site   . Osteoporosis, unspecified   . Abnormal involuntary movements(781.0)     Past Surgical History  Procedure Laterality Date  . Excisional hemorrhoidectomy  1980  . Bladder suspension  1993  . Back surgery  2003  . Abdominal hysterectomy      Social History:   reports that she has quit smoking. Her smoking use included Cigarettes. She smoked 0.00 packs per day for 40 years. She does not have any smokeless tobacco history on file. She reports that she does not drink alcohol or use illicit drugs.  Family History  Problem Relation Age of  Onset  . Congestive Heart Failure Mother   . Cancer Sister   . Arthritis Daughter   . Heart disease Daughter     Medications: Patient's Medications  New Prescriptions   No medications on file  Previous Medications   ALBUTEROL (PROVENTIL HFA;VENTOLIN HFA) 108 (90 BASE) MCG/ACT INHALER    Inhale 2 puffs into the lungs every 6 (six) hours as needed. Shortness of breath.   AMLODIPINE (NORVASC) 5 MG TABLET    TAKE 1 TABLET BY MOUTH DAILY TO CONTROL BLOOD PRESSURE    DICYCLOMINE (BENTYL) 10 MG CAPSULE    Take 10 mg by mouth at bedtime.   DULOXETINE (CYMBALTA) 30 MG CAPSULE    Take 1 capsule (30 mg total) by mouth daily.   HYDROCORTISONE (ANUSOL-HC) 2.5 % RECTAL CREAM    Place 1 application rectally daily as needed.   LATANOPROST (XALATAN) 0.005 % OPHTHALMIC SOLUTION    Place 1 drop into both eyes at bedtime.   LEVOTHYROXINE (SYNTHROID, LEVOTHROID) 50 MCG TABLET    TAKE 1 TABLET BY MOUTH ONCE EVERY DAY   LEVOTHYROXINE (SYNTHROID, LEVOTHROID) 75 MCG TABLET    Take 75 mcg by mouth daily.   LISINOPRIL (PRINIVIL,ZESTRIL) 40 MG TABLET    Take 40 mg by mouth daily.   LORAZEPAM (ATIVAN) 0.5 MG TABLET    Take 2 tablets every evening as needed for anxiety   LOVASTATIN (MEVACOR) 20 MG TABLET    Take 20 mg by mouth daily.   MULTIPLE VITAMINS-MINERALS (ICAPS LUTEIN & OMEGA-3) CAPS    Take 1 capsule by mouth daily.   VITAMIN B-12 (CYANOCOBALAMIN) 250 MCG TABLET    Take 250 mcg by mouth daily.  Modified Medications   Modified Medication Previous Medication   DULOXETINE (CYMBALTA) 60 MG CAPSULE DULoxetine (CYMBALTA) 60 MG capsule      Take 60 mg by mouth daily. Take 1 capsule by mouth every other day as needed for severe constipation.    Take 1 capsule (60 mg total) by mouth daily.  Discontinued Medications   No medications on file     Physical Exam: Filed Vitals:   05/19/13 1328  BP: 152/78  Pulse: 86  Temp: 97.4 F (36.3 C)  TempSrc: Oral  Resp: 18  Height: 5\' 1"  (1.549 m)  Weight: 109 lb 3.2 oz (49.533 kg)  SpO2: 95%  Physical Exam  Constitutional: No distress.  HENT:  Head: Normocephalic and atraumatic.  Cardiovascular: Normal rate, regular rhythm, normal heart sounds and intact distal pulses.   Pulmonary/Chest: Effort normal and breath sounds normal. No respiratory distress.  Abdominal: Soft. Bowel sounds are normal. She exhibits no distension and no mass. There is no tenderness.  Musculoskeletal: Normal range of motion. She exhibits no edema and no  tenderness.  Neurological: She is alert.  Skin: Skin is warm and dry.     Labs reviewed: Basic Metabolic Panel:  Recent Labs  16/10/96 1107 03/26/13 1015 04/14/13 0956  NA 141  --  141  K 4.1  --  4.5  CL 103  --  100  CO2 26  --  26  GLUCOSE 89  --  97  BUN 17  --  16  CREATININE 0.70  --  0.76  CALCIUM 9.7  --  10.2  MG 2.1  --   --   PHOS 3.8  --   --   TSH 0.233* 3.850  --    Liver Function Tests:  Recent Labs  04/14/13 0956  AST 20  ALT 14  ALKPHOS 70  BILITOT 0.3  PROT 6.1  CBC:  Recent Labs  02/13/13 1107  WBC 7.9  NEUTROABS 6.1  HGB 12.7  HCT 37.0  MCV 90    Assessment/Plan 1. Chronic obstructive asthma, unspecified -given inhaler for shortness of breath - albuterol (PROVENTIL HFA;VENTOLIN HFA) 108 (90 BASE) MCG/ACT inhaler; Inhale 2 puffs into the lungs every 6 (six) hours as needed. Shortness of breath.  Dispense: 1 Inhaler; Refill: 6  2. Chronic constipation -reviewed high fiber diet, fluids, use of stool softeners and miralax if needed, if this remains a problem, will try linzess if these are not successful  Next appt:  3 mos

## 2013-05-19 NOTE — Patient Instructions (Signed)
Constipation, Adult Constipation is when a person has fewer than 3 bowel movements a week; has difficulty having a bowel movement; or has stools that are dry, hard, or larger than normal. As people grow older, constipation is more common. If you try to fix constipation with medicines that make you have a bowel movement (laxatives), the problem may get worse. Long-term laxative use may cause the muscles of the colon to become weak. A low-fiber diet, not taking in enough fluids, and taking certain medicines may make constipation worse. CAUSES   Certain medicines, such as antidepressants, pain medicine, iron supplements, antacids, and water pills.   Certain diseases, such as diabetes, irritable bowel syndrome (IBS), thyroid disease, or depression.   Not drinking enough water.   Not eating enough fiber-rich foods.   Stress or travel.  Lack of physical activity or exercise.  Not going to the restroom when there is the urge to have a bowel movement.  Ignoring the urge to have a bowel movement.  Using laxatives too much. SYMPTOMS   Having fewer than 3 bowel movements a week.   Straining to have a bowel movement.   Having hard, dry, or larger than normal stools.   Feeling full or bloated.   Pain in the lower abdomen.  Not feeling relief after having a bowel movement. DIAGNOSIS  Your caregiver will take a medical history and perform a physical exam. Further testing may be done for severe constipation. Some tests may include:   A barium enema X-ray to examine your rectum, colon, and sometimes, your small intestine.  A sigmoidoscopy to examine your lower colon.  A colonoscopy to examine your entire colon. TREATMENT  Treatment will depend on the severity of your constipation and what is causing it. Some dietary treatments include drinking more fluids and eating more fiber-rich foods. Lifestyle treatments may include regular exercise. If these diet and lifestyle recommendations  do not help, your caregiver may recommend taking over-the-counter laxative medicines to help you have bowel movements. Prescription medicines may be prescribed if over-the-counter medicines do not work.  HOME CARE INSTRUCTIONS   Increase dietary fiber in your diet, such as fruits, vegetables, whole grains, and beans. Limit high-fat and processed sugars in your diet, such as [place] fries, hamburgers, cookies, candies, and soda.   A fiber supplement may be added to your diet if you cannot get enough fiber from foods.   Drink enough fluids to keep your urine clear or pale yellow.   Exercise regularly or as directed by your caregiver.   Go to the restroom when you have the urge to go. Do not hold it.  Only take medicines as directed by your caregiver. Do not take other medicines for constipation without talking to your caregiver first. SEEK IMMEDIATE MEDICAL CARE IF:   You have bright red blood in your stool.   Your constipation lasts for more than 4 days or gets worse.   You have abdominal or rectal pain.   You have thin, pencil-like stools.  You have unexplained weight loss. MAKE SURE YOU:   Understand these instructions.  Will watch your condition.  Will get help right away if you are not doing well or get worse. Document Released: 07/21/2004 Document Revised: 01/15/2012 Document Reviewed: 09/26/2011 [facility] Patient Information 2014 [place], [place].  I suggest using metamucil daily to help move your bowels.  Try to drink 6 eight oz glasses of water daily.  If this approach does not work or stools remain hard, begin senna-s tablets  once a day.

## 2013-05-22 ENCOUNTER — Encounter: Payer: Self-pay | Admitting: Internal Medicine

## 2013-05-24 ENCOUNTER — Other Ambulatory Visit (HOSPITAL_BASED_OUTPATIENT_CLINIC_OR_DEPARTMENT_OTHER): Payer: Self-pay | Admitting: Internal Medicine

## 2013-06-04 ENCOUNTER — Encounter: Payer: Self-pay | Admitting: Internal Medicine

## 2013-06-04 ENCOUNTER — Ambulatory Visit (INDEPENDENT_AMBULATORY_CARE_PROVIDER_SITE_OTHER): Payer: Medicare Other | Admitting: Internal Medicine

## 2013-06-04 VITALS — BP 132/78 | HR 73 | Temp 97.4°F | Resp 14 | Ht 61.0 in | Wt 106.2 lb

## 2013-06-04 DIAGNOSIS — F29 Unspecified psychosis not due to a substance or known physiological condition: Secondary | ICD-10-CM | POA: Insufficient documentation

## 2013-06-04 DIAGNOSIS — K5900 Constipation, unspecified: Secondary | ICD-10-CM

## 2013-06-04 DIAGNOSIS — F329 Major depressive disorder, single episode, unspecified: Secondary | ICD-10-CM

## 2013-06-04 DIAGNOSIS — F3289 Other specified depressive episodes: Secondary | ICD-10-CM

## 2013-06-04 MED ORDER — QUETIAPINE FUMARATE 25 MG PO TABS: 25.0000 mg | ORAL_TABLET | Freq: Two times a day (BID) | ORAL | Status: DC

## 2013-06-04 MED ORDER — RISPERIDONE 0.25 MG PO TABS: 0.2500 mg | ORAL_TABLET | Freq: Two times a day (BID) | ORAL | Status: DC

## 2013-06-04 NOTE — Progress Notes (Signed)
Patient ID: Jane [name], female   DOB: 12/27/1924, 79 y.o.   MRN: [id number]  Chief Complaint  Patient presents with  . Agitation    Shouting match in dining room with another resident and in common area. Patient states she feels extremely mad and angry at herself   HPI 79 Y/O female patient residing in independent living is here with her daughter. She has been agitated recently. Has been on zoloft for 9 years for depression until march 2014. She was having agitation and depression and her zoloft was switched to cymbalta then. After this she started having constipation and with her hx of bowel obstruction this was stopped on 14July 2014. Now she lives in Independent living and her manager called her daughter saying pt has been very agitated, thinks people are talking about her and shouting at other resident, calling names and got into fight with her co-residents. Pt has been tearful and having feeling to kill herself. Her bowel movement is regular  Review of Systems  Constitutional: Negative for fever, chills and diaphoresis.  HENT: Negative for congestion.   Eyes: Negative for blurred vision.  Respiratory: Negative for cough and shortness of breath.   Cardiovascular: Negative for chest pain, palpitations, claudication and leg swelling.  Gastrointestinal: Negative for heartburn, nausea and vomiting.  Genitourinary: Negative for dysuria, urgency, frequency and flank pain.  Musculoskeletal: Negative for falls.  Skin: Negative for itching and rash.  Neurological: Negative for weakness and headaches.  Psychiatric/Behavioral: Positive for depression and memory loss. The patient is nervous/anxious.    BP 132/78  Pulse 73  Temp(Src) 97.4 F (36.3 C) (Oral)  Resp 14  Ht 5\' 1"  (1.549 m)  Wt 106 lb 3.2 oz (48.172 kg)  BMI 20.08 kg/m2  gen- elderly frail lady in no distress heent- no pallor, icterus, LAD, mmm cvs- ns 1, s2, rrr respi- CTAB abdo- bs+, no suprapubic tenderness, no flank  pain, no abdominal mass or tenderness Ext- able to move all 4, has a cane Neuro- no focal deficit Psych- tearful and paranoid this visit, is calm  ASSESSMENT/PLAN  Behavioral changes- has known hx of depression along with psychosis and paranoid features recently. Not on any antidepressants or antipsychotics. Will start her on seroquel 25 mg bid for now with risperidone 0.25 mg bid. Common side effects with these medications explained. With these medications, constipation is a possibel side effect. So advised patient to take metamucil and senokot daily for now. Both patient and her daughter understands this  Constipation- continue metamucil and senokot but on daily basis  HTN- bp remains stable. Continue current regimen

## 2013-06-11 ENCOUNTER — Other Ambulatory Visit: Payer: Self-pay

## 2013-06-19 ENCOUNTER — Ambulatory Visit (INDEPENDENT_AMBULATORY_CARE_PROVIDER_SITE_OTHER): Payer: Medicare Other | Admitting: Internal Medicine

## 2013-06-19 ENCOUNTER — Encounter: Payer: Self-pay | Admitting: Internal Medicine

## 2013-06-19 VITALS — BP 138/72 | HR 78 | Temp 96.0°F | Resp 14 | Ht 61.0 in | Wt 110.2 lb

## 2013-06-19 DIAGNOSIS — E039 Hypothyroidism, unspecified: Secondary | ICD-10-CM | POA: Diagnosis not present

## 2013-06-19 DIAGNOSIS — M810 Age-related osteoporosis without current pathological fracture: Secondary | ICD-10-CM | POA: Diagnosis not present

## 2013-06-19 DIAGNOSIS — F29 Unspecified psychosis not due to a substance or known physiological condition: Secondary | ICD-10-CM

## 2013-06-19 DIAGNOSIS — F3289 Other specified depressive episodes: Secondary | ICD-10-CM | POA: Diagnosis not present

## 2013-06-19 DIAGNOSIS — J4489 Other specified chronic obstructive pulmonary disease: Secondary | ICD-10-CM | POA: Diagnosis not present

## 2013-06-19 DIAGNOSIS — F329 Major depressive disorder, single episode, unspecified: Secondary | ICD-10-CM | POA: Diagnosis not present

## 2013-06-19 DIAGNOSIS — J449 Chronic obstructive pulmonary disease, unspecified: Secondary | ICD-10-CM

## 2013-06-19 MED ORDER — RISPERIDONE 0.25 MG PO TABS: 0.2500 mg | ORAL_TABLET | Freq: Every evening | ORAL | Status: DC

## 2013-06-19 NOTE — Patient Instructions (Signed)
We will contact you with your reclast infusion appointment.
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
## 2013-06-20 LAB — TSH: TSH: 4.63 u[IU]/mL — ABNORMAL HIGH (ref 0.450–4.500)

## 2013-06-24 ENCOUNTER — Other Ambulatory Visit (HOSPITAL_BASED_OUTPATIENT_CLINIC_OR_DEPARTMENT_OTHER): Payer: Self-pay | Admitting: Nurse Practitioner

## 2013-06-24 ENCOUNTER — Other Ambulatory Visit: Payer: Self-pay | Admitting: Internal Medicine

## 2013-07-24 ENCOUNTER — Other Ambulatory Visit: Payer: Self-pay | Admitting: *Deleted

## 2013-07-24 ENCOUNTER — Other Ambulatory Visit (HOSPITAL_BASED_OUTPATIENT_CLINIC_OR_DEPARTMENT_OTHER): Payer: Self-pay | Admitting: Internal Medicine

## 2013-08-04 DIAGNOSIS — B351 Tinea unguium: Secondary | ICD-10-CM | POA: Diagnosis not present

## 2013-08-04 DIAGNOSIS — M216X9 Other acquired deformities of unspecified foot: Secondary | ICD-10-CM | POA: Diagnosis not present

## 2013-08-04 DIAGNOSIS — L84 Corns and callosities: Secondary | ICD-10-CM | POA: Diagnosis not present

## 2013-08-18 ENCOUNTER — Encounter: Payer: Self-pay | Admitting: Internal Medicine

## 2013-08-18 ENCOUNTER — Ambulatory Visit (INDEPENDENT_AMBULATORY_CARE_PROVIDER_SITE_OTHER): Payer: Medicare Other | Admitting: Internal Medicine

## 2013-08-18 VITALS — BP 130/76 | HR 88 | Temp 98.2°F | Wt 114.0 lb

## 2013-08-18 DIAGNOSIS — M810 Age-related osteoporosis without current pathological fracture: Secondary | ICD-10-CM

## 2013-08-18 DIAGNOSIS — F29 Unspecified psychosis not due to a substance or known physiological condition: Secondary | ICD-10-CM

## 2013-08-18 DIAGNOSIS — I10 Essential (primary) hypertension: Secondary | ICD-10-CM | POA: Diagnosis not present

## 2013-08-18 DIAGNOSIS — B001 Herpesviral vesicular dermatitis: Secondary | ICD-10-CM

## 2013-08-18 DIAGNOSIS — Z23 Encounter for immunization: Secondary | ICD-10-CM

## 2013-08-18 DIAGNOSIS — M1990 Unspecified osteoarthritis, unspecified site: Secondary | ICD-10-CM | POA: Diagnosis not present

## 2013-08-18 DIAGNOSIS — F3289 Other specified depressive episodes: Secondary | ICD-10-CM

## 2013-08-18 DIAGNOSIS — R5381 Other malaise: Secondary | ICD-10-CM | POA: Diagnosis not present

## 2013-08-18 DIAGNOSIS — F329 Major depressive disorder, single episode, unspecified: Secondary | ICD-10-CM

## 2013-08-18 DIAGNOSIS — J069 Acute upper respiratory infection, unspecified: Secondary | ICD-10-CM

## 2013-08-18 DIAGNOSIS — E039 Hypothyroidism, unspecified: Secondary | ICD-10-CM

## 2013-08-18 DIAGNOSIS — B009 Herpesviral infection, unspecified: Secondary | ICD-10-CM

## 2013-08-18 DIAGNOSIS — E785 Hyperlipidemia, unspecified: Secondary | ICD-10-CM

## 2013-08-18 DIAGNOSIS — M129 Arthropathy, unspecified: Secondary | ICD-10-CM | POA: Diagnosis not present

## 2013-08-18 MED ORDER — ZOSTER VACCINE LIVE 19400 UNT/0.65ML ~~LOC~~ SOLR: 0.6500 mL | Freq: Once | SUBCUTANEOUS | Status: DC

## 2013-08-18 NOTE — Progress Notes (Signed)
Patient ID: Jane [name], female   DOB: Sep 08, 1925, 79 y.o.   MRN: [id number] Location:  [facility]  Code Status: DNR   Allergies  Allergen Reactions  . Celebrex [Celecoxib]   . Chlorzoxazone     Chief Complaint  Patient presents with  . Medical Managment of Chronic Issues    3 month follow-up   . Other    At times patients hands are freezing cold but her back is very hot  . Leg Swelling    ankle/leg swelling (bilateral) x couple months     HPI: Patient is a 79 y.o. white female who [facility] in the office today for f/u of chronic conditions and a few concerns today as above.  Feels cold with cold hands, but touches center of her back and it's scalding hot.  Discussed with her daughter also who suspects her hands are just cold so it seems like her back is hot.      Ankles are swelling a little at nighttime also, but they go down by am.  She does have some dyspnea on exertion.  Does not wake up short of breath.       Back and shoulders ache.  Has cold sore on lip.  Feels like her saliva is thick.    Has sciatica in left leg.  Keeps her up at night if the weather is getting bad.  Needles in the foot drive her crazy.  Does better when she walks.    Review of Systems:  Review of Systems  Constitutional: Positive for malaise/fatigue. Negative for fever, chills and weight loss.  HENT: Positive for congestion.        Cold sore on lip  Eyes: Negative for blurred vision.  Respiratory: Positive for shortness of breath. Negative for cough.   Cardiovascular: Positive for leg swelling. Negative for chest pain and palpitations.  Gastrointestinal: Negative for heartburn, abdominal pain and constipation.  Genitourinary: Positive for urgency. Negative for dysuria.       Has to wear a pad when she goes out  Musculoskeletal: Positive for back pain, joint pain, myalgias and neck pain. Negative for falls.  Skin: Negative for rash.  Neurological:  Negative for dizziness and focal weakness.  Endo/Heme/Allergies: Bruises/bleeds easily.  Psychiatric/Behavioral: Positive for hallucinations and memory loss.    Past Medical History  Diagnosis Date  . Abdominal pain, epigastric   . Impacted cerumen   . Abnormality of gait   . Anal fissure   . Loss of weight   . Hyposmolality and/or hyponatremia   . Pain in limb   . Other vitamin B12 deficiency anemia   . Chest pain, unspecified   . Anemia, unspecified   . Anxiety state, unspecified   . Abdominal pain, right upper quadrant   . Other pulmonary embolism and infarction   . Intestinal or peritoneal adhesions with obstruction (postoperative) (postinfection)   . Long term (current) use of anticoagulants   . Abdominal pain, right lower quadrant   . Essential and other specified forms of tremor   . Lumbago   . Unspecified hypothyroidism   . Major depressive disorder, single episode, unspecified   . Other and unspecified hyperlipidemia   . Macular degeneration (senile) of retina, unspecified   . Unspecified glaucoma(365.9)   . Unspecified cataract   . Unspecified hearing loss   . Unspecified essential hypertension   . Osteoarthrosis, unspecified whether generalized or localized, unspecified site   . Osteoporosis, unspecified   .  Abnormal involuntary movements(781.0)     Past Surgical History  Procedure Laterality Date  . Excisional hemorrhoidectomy  1980  . Bladder suspension  1993  . Back surgery  2003  . Abdominal hysterectomy      Social History:   reports that she has quit smoking. Her smoking use included Cigarettes. She smoked 0.00 packs per day for 40 years. She does not have any smokeless tobacco history on file. She reports that she does not drink alcohol or use illicit drugs.  Family History  Problem Relation Age of Onset  . Congestive Heart Failure Mother   . Cancer Sister   . Arthritis Daughter   . Heart disease Daughter     Medications: Patient's  Medications  New Prescriptions   No medications on file  Previous Medications   ALBUTEROL (PROVENTIL HFA;VENTOLIN HFA) 108 (90 BASE) MCG/ACT INHALER    Inhale 2 puffs into the lungs every 6 (six) hours as needed. Shortness of breath.   AMLODIPINE (NORVASC) 5 MG TABLET    TAKE 1 TABLET BY MOUTH DAILY TO CONTROL BLOOD PRESSURE   DICYCLOMINE (BENTYL) 10 MG CAPSULE    Take 10 mg by mouth at bedtime.   HYDROCORTISONE (ANUSOL-HC) 2.5 % RECTAL CREAM    Place 1 application rectally daily as needed.   LATANOPROST (XALATAN) 0.005 % OPHTHALMIC SOLUTION    Place 1 drop into both eyes at bedtime.   LEVOTHYROXINE (SYNTHROID, LEVOTHROID) 50 MCG TABLET    TAKE 1 TABLET BY MOUTH DAILY   LISINOPRIL (PRINIVIL,ZESTRIL) 40 MG TABLET    TAKE 1 TABLET BY MOUTH DAILY   LORAZEPAM (ATIVAN) 0.5 MG TABLET    Take 2 tablets every evening as needed for anxiety   LOVASTATIN (MEVACOR) 20 MG TABLET    TAKE 1 TABLET BY MOUTH DAILY   MULTIPLE VITAMINS-MINERALS (ICAPS LUTEIN & OMEGA-3) CAPS    Take 1 capsule by mouth daily.   QUETIAPINE (SEROQUEL) 25 MG TABLET    Take 1 tablet (25 mg total) by mouth 2 (two) times daily.   RISPERIDONE (RISPERDAL) 0.25 MG TABLET    Take 1 tablet (0.25 mg total) by mouth every evening.   VITAMIN B-12 (CYANOCOBALAMIN) 250 MCG TABLET    Take 250 mcg by mouth daily.  Modified Medications   Modified Medication Previous Medication   ZOSTER VACCINE LIVE, PF, (ZOSTAVAX) 84132 UNT/0.65ML INJECTION zoster vaccine live, PF, (ZOSTAVAX) 44010 UNT/0.65ML injection      Inject 19,400 Units into the skin once.    Inject 0.65 mLs into the skin once.  Discontinued Medications   AMLODIPINE (NORVASC) 5 MG TABLET    TAKE 1 TABLET BY MOUTH DAILY TO CONTROL BLOOD PRESSURE   LISINOPRIL (PRINIVIL,ZESTRIL) 40 MG TABLET    TAKE 1 TABLET BY MOUTH DAILY   RISPERIDONE (RISPERDAL) 0.25 MG TABLET    TAKE 1 TABLET BY MOUTH TWICE DAILY   RISPERIDONE (RISPERDAL) 0.25 MG TABLET    TAKE 1 TABLET BY MOUTH TWICE DAILY      Physical Exam: Filed Vitals:   08/18/13 1425  BP: 130/76  Pulse: 88  Temp: 98.2 F (36.8 C)  TempSrc: Oral  Weight: 114 lb (51.71 kg)  SpO2: 95%  Physical Exam  Constitutional: No distress.  Frail white female, nad  HENT:  Head: Normocephalic and atraumatic.  Eyes: EOM are normal. Pupils are equal, round, and reactive to light.  glasses  Neck: Normal range of motion. Neck supple.  Cardiovascular: Normal rate, regular rhythm, normal heart sounds and intact distal pulses.  Pulmonary/Chest: Effort normal and breath sounds normal. No respiratory distress.  Abdominal: Soft. Bowel sounds are normal. She exhibits no distension. There is no tenderness.  Musculoskeletal: She exhibits no edema and no tenderness.  Stooped posture, walks with walker  Neurological: She is alert. No cranial nerve deficit.  Skin: Skin is warm and dry.    Labs reviewed: Basic Metabolic Panel:  Recent Labs  09/81/19 1107 03/26/13 1015 04/14/13 0956 06/19/13 1023  NA 141  --  141  --   K 4.1  --  4.5  --   CL 103  --  100  --   CO2 26  --  26  --   GLUCOSE 89  --  97  --   BUN 17  --  16  --   CREATININE 0.70  --  0.76  --   CALCIUM 9.7  --  10.2  --   MG 2.1  --   --   --   PHOS 3.8  --   --   --   TSH 0.233* 3.850  --  4.630*   Liver Function Tests:  Recent Labs  04/14/13 0956  AST 20  ALT 14  ALKPHOS 70  BILITOT 0.3  PROT 6.1  CBC:  Recent Labs  02/13/13 1107  WBC 7.9  NEUTROABS 6.1  HGB 12.7  HCT 37.0  MCV 90  Health Maintenance reviewed - pt refuses all routine screening at this time.  She was brought up to date on her immunizations--script given for zostavax at pharmacy.  Has had pneumococcal previously and received flu shot at [place] this year.  Assessment/Plan 1. Upper respiratory virus -seems to be resolving--has some sinus congestion -advised it was ok to use tylenol cold and sinus as directed for a short course if she was bothered by her  symptoms -encouraged hydration - CBC with Differential; Future  2. Cold sore -discussed that she is fighting her infection (or simply ate too much of the tomato soup they served--typically avoids acidic foods due to her gastritis)  3. Psychosis -improved with risperdal--her daughter notes she is much easier to deal with since  4. Depressive disorder, not elsewhere classified -mood is better recently, occasionally has tearful days  5. Hypothyroidism -stable on current synthroid, but due for lab follow-up (has required adjustments several times) - TSH; Future  6. Essential hypertension, benign -at goal with current therapy - Basic metabolic panel; Future  7. Senile osteoporosis -cont repletion of vitamin D - check levels:  Vitamin D, 25-hydroxy; Future  8. Hyperlipidemia LDL goal < 100 - Lipid panel; Future - was not on meds  Labs/tests ordered:  Tsh, flp, cbc, bmp, vit D before next visit in 3 mos Next appt:  3 mos

## 2013-08-25 ENCOUNTER — Other Ambulatory Visit: Payer: Self-pay | Admitting: Internal Medicine

## 2013-08-25 ENCOUNTER — Other Ambulatory Visit (HOSPITAL_BASED_OUTPATIENT_CLINIC_OR_DEPARTMENT_OTHER): Payer: Self-pay | Admitting: Nurse Practitioner

## 2013-09-15 DIAGNOSIS — H4011X0 Primary open-angle glaucoma, stage unspecified: Secondary | ICD-10-CM | POA: Diagnosis not present

## 2013-09-15 DIAGNOSIS — H409 Unspecified glaucoma: Secondary | ICD-10-CM | POA: Diagnosis not present

## 2013-09-22 ENCOUNTER — Other Ambulatory Visit: Payer: Self-pay | Admitting: Internal Medicine

## 2013-09-22 ENCOUNTER — Other Ambulatory Visit: Payer: Self-pay | Admitting: *Deleted

## 2013-09-22 ENCOUNTER — Other Ambulatory Visit (HOSPITAL_BASED_OUTPATIENT_CLINIC_OR_DEPARTMENT_OTHER): Payer: Self-pay | Admitting: Nurse Practitioner

## 2013-09-22 MED ORDER — LORAZEPAM 0.5 MG PO TABS: ORAL_TABLET | ORAL | Status: DC

## 2013-10-27 ENCOUNTER — Other Ambulatory Visit (HOSPITAL_BASED_OUTPATIENT_CLINIC_OR_DEPARTMENT_OTHER): Payer: Self-pay | Admitting: Internal Medicine

## 2013-11-17 ENCOUNTER — Ambulatory Visit: Payer: Medicare Other | Admitting: Podiatry

## 2013-11-20 ENCOUNTER — Other Ambulatory Visit: Payer: Medicare Other

## 2013-11-20 DIAGNOSIS — I10 Essential (primary) hypertension: Secondary | ICD-10-CM | POA: Diagnosis not present

## 2013-11-20 DIAGNOSIS — E785 Hyperlipidemia, unspecified: Secondary | ICD-10-CM

## 2013-11-20 DIAGNOSIS — J069 Acute upper respiratory infection, unspecified: Secondary | ICD-10-CM | POA: Diagnosis not present

## 2013-11-20 DIAGNOSIS — M810 Age-related osteoporosis without current pathological fracture: Secondary | ICD-10-CM | POA: Diagnosis not present

## 2013-11-20 DIAGNOSIS — E039 Hypothyroidism, unspecified: Secondary | ICD-10-CM | POA: Diagnosis not present

## 2013-11-21 LAB — CBC WITH DIFFERENTIAL/PLATELET
Basophils Absolute: 0 10*3/uL (ref 0.0–0.2)
Basos: 0 %
Eos: 3 %
Eosinophils Absolute: 0.1 10*3/uL (ref 0.0–0.4)
HCT: 35.2 % (ref 34.0–46.6)
Hemoglobin: 11.8 g/dL (ref 11.1–15.9)
Immature Grans (Abs): 0 10*3/uL (ref 0.0–0.1)
Immature Granulocytes: 0 %
Lymphocytes Absolute: 1.3 10*3/uL (ref 0.7–3.1)
Lymphs: 24 %
MCH: 30.7 pg (ref 26.6–33.0)
MCHC: 33.5 g/dL (ref 31.5–35.7)
MCV: 92 fL (ref 79–97)
Monocytes Absolute: 0.5 10*3/uL (ref 0.1–0.9)
Monocytes: 10 %
Neutrophils Absolute: 3.3 10*3/uL (ref 1.4–7.0)
Neutrophils Relative %: 63 %
RBC: 3.84 x10E6/uL (ref 3.77–5.28)
RDW: 13.6 % (ref 12.3–15.4)
WBC: 5.2 10*3/uL (ref 3.4–10.8)

## 2013-11-21 LAB — VITAMIN D 25 HYDROXY (VIT D DEFICIENCY, FRACTURES): Vit D, 25-Hydroxy: 35.3 ng/mL (ref 30.0–100.0)

## 2013-11-21 LAB — BASIC METABOLIC PANEL
BUN/Creatinine Ratio: 19 (ref 11–26)
BUN: 18 mg/dL (ref 8–27)
CO2: 23 mmol/L (ref 18–29)
Calcium: 9.7 mg/dL (ref 8.6–10.2)
Chloride: 104 mmol/L (ref 97–108)
Creatinine, Ser: 0.94 mg/dL (ref 0.57–1.00)
GFR calc Af Amer: 64 mL/min/{1.73_m2} (ref >59)
GFR calc non Af Amer: 55 mL/min/{1.73_m2} — ABNORMAL LOW (ref >59)
Glucose: 83 mg/dL (ref 65–99)
Potassium: 4.4 mmol/L (ref 3.5–5.2)
Sodium: 142 mmol/L (ref 134–144)

## 2013-11-21 LAB — LIPID PANEL
Chol/HDL Ratio: 2.6 ratio units (ref 0.0–4.4)
Cholesterol, Total: 147 mg/dL (ref 100–199)
HDL: 56 mg/dL (ref >39)
LDL Calculated: 77 mg/dL (ref 0–99)
Triglycerides: 68 mg/dL (ref 0–149)
VLDL Cholesterol Cal: 14 mg/dL (ref 5–40)

## 2013-11-21 LAB — TSH: TSH: 6.25 u[IU]/mL — ABNORMAL HIGH (ref 0.450–4.500)

## 2013-11-24 ENCOUNTER — Other Ambulatory Visit (HOSPITAL_BASED_OUTPATIENT_CLINIC_OR_DEPARTMENT_OTHER): Payer: Self-pay | Admitting: Internal Medicine

## 2013-11-24 ENCOUNTER — Encounter: Payer: Self-pay | Admitting: *Deleted

## 2013-11-24 ENCOUNTER — Other Ambulatory Visit: Payer: Self-pay | Admitting: Adult Health

## 2013-11-24 ENCOUNTER — Telehealth: Payer: Self-pay

## 2013-11-24 ENCOUNTER — Encounter: Payer: Self-pay | Admitting: Internal Medicine

## 2013-11-24 ENCOUNTER — Ambulatory Visit (INDEPENDENT_AMBULATORY_CARE_PROVIDER_SITE_OTHER): Payer: Medicare Other | Admitting: Internal Medicine

## 2013-11-24 VITALS — BP 120/72 | HR 68 | Temp 96.6°F | Resp 10 | Wt 116.0 lb

## 2013-11-24 DIAGNOSIS — J432 Centrilobular emphysema: Secondary | ICD-10-CM

## 2013-11-24 DIAGNOSIS — J438 Other emphysema: Secondary | ICD-10-CM | POA: Diagnosis not present

## 2013-11-24 DIAGNOSIS — R0609 Other forms of dyspnea: Secondary | ICD-10-CM | POA: Diagnosis not present

## 2013-11-24 DIAGNOSIS — R0989 Other specified symptoms and signs involving the circulatory and respiratory systems: Secondary | ICD-10-CM | POA: Diagnosis not present

## 2013-11-24 DIAGNOSIS — E039 Hypothyroidism, unspecified: Secondary | ICD-10-CM

## 2013-11-24 DIAGNOSIS — R0789 Other chest pain: Secondary | ICD-10-CM | POA: Diagnosis not present

## 2013-11-24 DIAGNOSIS — M810 Age-related osteoporosis without current pathological fracture: Secondary | ICD-10-CM

## 2013-11-24 DIAGNOSIS — K5909 Other constipation: Secondary | ICD-10-CM

## 2013-11-24 DIAGNOSIS — K5900 Constipation, unspecified: Secondary | ICD-10-CM

## 2013-11-24 DIAGNOSIS — R0600 Dyspnea, unspecified: Secondary | ICD-10-CM

## 2013-11-24 MED ORDER — LEVOTHYROXINE SODIUM 75 MCG PO TABS: 75.0000 ug | ORAL_TABLET | Freq: Every day | ORAL | Status: DC

## 2013-11-24 NOTE — Progress Notes (Signed)
Patient ID: Jane [name], female   DOB: [birth year], 79 y.o.   MRN: [id number]   Location:  [facility]  Code Status: DNR  Allergies  Allergen Reactions  . Celebrex [Celecoxib]   . Chlorzoxazone     Chief Complaint  Patient presents with  . Medical Managment of Chronic Issues    3 month follow-up   . Shortness of Breath    Patient c/o SOB x couple of months . Per patients daughter: patient has inhaler but does not use     HPI: Patient is a 80 y.o. white female seen in the office today for medical mgt of chronic diseases.  Is sob, but doesn't use her inhaler.  Feels as if she has run a race and can't get her breath and has tightness in the chest.  Has to clear her throat frequently.  Also is weaker.  Says she feels like a worn out dishcloth with holes.  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  Was diagnosed in the 1990s.  Had to take breaks during 4 hours of dancing on New Year's Eve.  No dyspnea at rest, but feels the tightness (cannot exactly explain the pain--is annoying, then goes into the left shoulder).  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  Would be pooped when done.  If would forget something, would run across the room and run back.  Was also not using cane when doing it.     Review of Systems:  Review of Systems  Constitutional: Positive for malaise/fatigue. Negative for fever and weight loss.  HENT: Positive for hearing loss.   Eyes: Negative for blurred vision.  Respiratory: Positive for cough and shortness of breath. Negative for sputum production and wheezing.   Cardiovascular: Positive for chest pain. Negative for leg swelling.  Gastrointestinal: Negative for abdominal pain, constipation, blood in stool and melena.  Genitourinary: Negative for dysuria.  Musculoskeletal: Positive for joint pain. Negative for falls and myalgias.  Neurological: Positive  for weakness. Negative for dizziness and headaches.  Endo/Heme/Allergies: Bruises/bleeds easily.  Psychiatric/Behavioral: Positive for memory loss. Negative for depression.    Past Medical History  Diagnosis Date  . Abdominal pain, epigastric   . Impacted cerumen   . Abnormality of gait   . Anal fissure   . Loss of weight   . Hyposmolality and/or hyponatremia   . Pain in limb   . Other vitamin B12 deficiency anemia   . Chest pain, unspecified   . Anemia, unspecified   . Anxiety state, unspecified   . Abdominal pain, right upper quadrant   . Other pulmonary embolism and infarction   . Intestinal or peritoneal adhesions with obstruction (postoperative) (postinfection)   . Long term (current) use of anticoagulants   . Abdominal pain, right lower quadrant   . Essential and other specified forms of tremor   . Lumbago   . Unspecified hypothyroidism   . Major depressive disorder, single episode, unspecified   . Other and unspecified hyperlipidemia   . Macular degeneration (senile) of retina, unspecified   . Unspecified glaucoma   . Unspecified cataract   . Unspecified hearing loss   . Unspecified essential hypertension   . Osteoarthrosis, unspecified whether generalized or localized, unspecified site   . Osteoporosis, unspecified   . Abnormal involuntary movements(781.0)     Past Surgical History  Procedure Laterality Date  . Excisional hemorrhoidectomy  1980  .  Bladder suspension  1993  . Back surgery  2003  . Abdominal hysterectomy    . Trigger finger release  2009    Dr Teressa SenterSypher    Social History:   reports that she has quit smoking. Her smoking use included Cigarettes. She smoked 0.00 packs per day for 40 years. She does not have any smokeless tobacco history on file. She reports that she does not drink alcohol or use illicit drugs.  Family History  Problem Relation Age of Onset  . Congestive Heart Failure Mother   . Macular degeneration Mother   . Heart disease  Mother   . Cancer Sister     lung cancer  . Arthritis Daughter   . Heart disease Daughter   . Glaucoma Sister   . Cataracts Sister     Medications: Patient's Medications  New Prescriptions   No medications on file  Previous Medications   ALBUTEROL (PROVENTIL HFA;VENTOLIN HFA) 108 (90 BASE) MCG/ACT INHALER    Inhale 2 puffs into the lungs every 6 (six) hours as needed. Shortness of breath.   AMLODIPINE (NORVASC) 5 MG TABLET    TAKE 1 TABLET BY MOUTH DAILY TO CONTROL BLOOD PRESSURE   DICYCLOMINE (BENTYL) 10 MG CAPSULE    Take 10 mg by mouth at bedtime.   HYDROCORTISONE (ANUSOL-HC) 2.5 % RECTAL CREAM    Place 1 application rectally daily as needed.   LATANOPROST (XALATAN) 0.005 % OPHTHALMIC SOLUTION    Place 1 drop into both eyes at bedtime.   LEVOTHYROXINE (SYNTHROID, LEVOTHROID) 75 MCG TABLET    Take 1 tablet (75 mcg total) by mouth daily before breakfast.   LISINOPRIL (PRINIVIL,ZESTRIL) 40 MG TABLET    TAKE 1 TABLET BY MOUTH DAILY   LOVASTATIN (MEVACOR) 20 MG TABLET    TAKE 1 TABLET BY MOUTH DAILY   MULTIPLE VITAMINS-MINERALS (ICAPS LUTEIN & OMEGA-3) CAPS    Take 1 capsule by mouth daily.   RISPERIDONE (RISPERDAL) 0.25 MG TABLET    Take 1 tablet (0.25 mg total) by mouth every evening.   VITAMIN B-12 (CYANOCOBALAMIN) 250 MCG TABLET    Take 250 mcg by mouth daily.   ZOSTER VACCINE LIVE, PF, (ZOSTAVAX) [id number] UNT/0.65ML INJECTION    Inject 19,400 Units into the skin once.  Modified Medications   Modified Medication Previous Medication   AMLODIPINE (NORVASC) 5 MG TABLET amLODipine (NORVASC) 5 MG tablet      TAKE 1 TABLET BY MOUTH DAILY TO CONTROL BLOOD PRESSURE    TAKE 1 TABLET BY MOUTH DAILY TO CONTROL BLOOD PRESSURE   LISINOPRIL (PRINIVIL,ZESTRIL) 40 MG TABLET lisinopril (PRINIVIL,ZESTRIL) 40 MG tablet      TAKE 1 TABLET BY MOUTH DAILY    TAKE 1 TABLET BY MOUTH DAILY   LORAZEPAM (ATIVAN) 0.5 MG TABLET LORazepam (ATIVAN) 0.5 MG tablet      TAKE 2 TABLETS BY MOUTH EVERY EVENING AS NEEDED  FOR ANXIETY    Take two tablets every evening as needed for anxiety   QUETIAPINE (SEROQUEL) 25 MG TABLET QUEtiapine (SEROQUEL) 25 MG tablet      TAKE 1 TABLET BY MOUTH TWICE DAILY    TAKE 1 TABLET BY MOUTH TWICE DAILY  Discontinued Medications   LOVASTATIN (MEVACOR) 20 MG TABLET    TAKE 1 TABLET BY MOUTH DAILY   RISPERIDONE (RISPERDAL) 0.25 MG TABLET    TAKE 1 TABLET BY MOUTH TWICE DAILY   RISPERIDONE (RISPERDAL) 0.25 MG TABLET    TAKE 1 TABLET BY MOUTH TWICE DAILY  Physical Exam: Filed Vitals:   11/24/13 1431  BP: 120/72  Pulse: 68  Temp: 96.6 F (35.9 C)  Resp: 10  Weight: 116 lb (52.617 kg)  SpO2: 93%  Physical Exam  Constitutional: She is oriented to person, place, and time. No distress.  Frail white female, walks independently but does have cane  HENT:  Head: Normocephalic and atraumatic.  Cardiovascular: Normal rate, regular rhythm, normal heart sounds and intact distal pulses.   Pulmonary/Chest: Effort normal. No respiratory distress.  Few left-sided rhonchi  Abdominal: Soft. Bowel sounds are normal. She exhibits no distension and no mass. There is no tenderness.  Musculoskeletal: Normal range of motion.  Neurological: She is alert and oriented to person, place, and time.  Skin: Skin is warm and dry.  Psychiatric: She has a normal mood and affect.    Labs reviewed: Basic Metabolic Panel:  Recent Labs  00/17/49 1107 03/26/13 1015 04/14/13 0956 06/19/13 1023 11/20/13 0924  NA 141  --  141  --  142  K 4.1  --  4.5  --  4.4  CL 103  --  100  --  104  CO2 26  --  26  --  23  GLUCOSE 89  --  97  --  83  BUN 17  --  16  --  18  CREATININE 0.70  --  0.76  --  0.94  CALCIUM 9.7  --  10.2  --  9.7  MG 2.1  --   --   --   --   PHOS 3.8  --   --   --   --   TSH 0.233* 3.850  --  4.630* 6.250*   Liver Function Tests:  Recent Labs  04/14/13 0956  AST 20  ALT 14  ALKPHOS 70  BILITOT 0.3  PROT 6.1  CBC:  Recent Labs  02/13/13 1107 11/20/13 0924  WBC  7.9 5.2  NEUTROABS 6.1 3.3  HGB 12.7 11.8  HCT 37.0 35.2  MCV 90 92   Lipid Panel:  Recent Labs  11/20/13 0924  HDL 56  LDLCALC 77  TRIG 68  CHOLHDL 2.6   EKG performed today with normal sinus rhythm, no acute ischemia or infarct by my reading  Assessment/Plan 1. Pressure in left side of chest - EKG 12-Lead was unremarkable -suspect this is emphysema related from prior heavy smoking -advised to use her inhaler when she experiences this  2. Dyspnea on exertion -likely due to #4 -again encouraged inhaler use and walking not running to do tasks as her daughter says she overdoes it for her age  16. Senile osteoporosis -resume ca w/ d chews as vitamin d goal is >40  4. Centrilobular emphysema -on prior CT  Chest 5/13 -has smoked a lot in the past -will see how she does with inhaler use  5. Hypothyroidism -increased synthroid to when labs returned due to TSH over 6  6. Chronic constipation -stable lately, no complaints related to this today with current regimen  Labs/tests ordered:  EKG today Next appt:  3 mos

## 2013-11-24 NOTE — Telephone Encounter (Signed)
Spoke with [name] (patient's daughter), discussed labs. Jane [name] verbalized understanding of results, copy of report to be given to patient at pending OV today

## 2013-11-24 NOTE — Telephone Encounter (Signed)
Message copied by Maurice Small on Mon Nov 24, 2013 11:47 AM ------      Message from: [place], [place] L      Created: Fri Nov 21, 2013 10:08 AM       Increase synthroid to first thing in the morning on an empty stomach. ------

## 2013-11-24 NOTE — Patient Instructions (Signed)
Resume taking your calcium chews  Start using the inhaler when you feel short of breath or your chest is tight--let's see if that works!

## 2013-12-01 ENCOUNTER — Ambulatory Visit: Payer: Medicare Other | Admitting: Podiatry

## 2013-12-08 ENCOUNTER — Ambulatory Visit: Payer: Medicare Other | Admitting: Podiatry

## 2013-12-08 ENCOUNTER — Encounter: Payer: Self-pay | Admitting: Podiatry

## 2013-12-08 VITALS — BP 141/72 | HR 77 | Resp 18

## 2013-12-08 DIAGNOSIS — B351 Tinea unguium: Secondary | ICD-10-CM

## 2013-12-08 DIAGNOSIS — M79609 Pain in unspecified limb: Secondary | ICD-10-CM | POA: Diagnosis not present

## 2013-12-08 NOTE — Progress Notes (Signed)
   Subjective:    Patient ID: Jane [name], female    DOB: Apr 11, 1925, 80 y.o.   MRN: [id number]  HPI  I need my little toenails cut not my big toenails and trim my calluses. This patient was last seen for similar service on 08/04/2013 by DR. Regal    Review of Systems     Objective:   Physical Exam Pleasant orientated x3 white female  Dermatological: Elongated, discolored, hypertrophic toenails x10. Plantar keratoses left first MPJ.       Assessment & Plan:  Assessment: Symptomatic onychomycoses x10 Keratoses x1  Plan: Nails x10 and keratoses x1 debrided back without a bleeding. Patient requesting a digital silicone padded discharge which was dispensed.  Reappoint at three-month intervals

## 2013-12-11 ENCOUNTER — Encounter: Payer: Self-pay | Admitting: Internal Medicine

## 2013-12-22 ENCOUNTER — Other Ambulatory Visit (HOSPITAL_BASED_OUTPATIENT_CLINIC_OR_DEPARTMENT_OTHER): Payer: Self-pay | Admitting: Internal Medicine

## 2014-01-13 ENCOUNTER — Encounter: Payer: Self-pay | Admitting: Internal Medicine

## 2014-01-13 ENCOUNTER — Ambulatory Visit (INDEPENDENT_AMBULATORY_CARE_PROVIDER_SITE_OTHER): Payer: Medicare Other | Admitting: Internal Medicine

## 2014-01-13 VITALS — BP 132/70 | HR 81 | Temp 97.4°F | Resp 16 | Wt 115.0 lb

## 2014-01-13 DIAGNOSIS — J069 Acute upper respiratory infection, unspecified: Secondary | ICD-10-CM | POA: Diagnosis not present

## 2014-01-13 DIAGNOSIS — R0789 Other chest pain: Secondary | ICD-10-CM | POA: Diagnosis not present

## 2014-01-13 DIAGNOSIS — E039 Hypothyroidism, unspecified: Secondary | ICD-10-CM | POA: Diagnosis not present

## 2014-01-13 DIAGNOSIS — K5900 Constipation, unspecified: Secondary | ICD-10-CM | POA: Diagnosis not present

## 2014-01-13 DIAGNOSIS — R0609 Other forms of dyspnea: Secondary | ICD-10-CM | POA: Diagnosis not present

## 2014-01-13 DIAGNOSIS — M810 Age-related osteoporosis without current pathological fracture: Secondary | ICD-10-CM | POA: Diagnosis not present

## 2014-01-13 DIAGNOSIS — J438 Other emphysema: Secondary | ICD-10-CM | POA: Diagnosis not present

## 2014-01-13 NOTE — Progress Notes (Signed)
Patient ID: Jane [name], female   DOB: 03-03-1925, 80 y.o.   MRN: [id number]    Location:    [facility]  Place of Service:  OFFICE    Allergies  Allergen Reactions  . Celebrex [Celecoxib]   . Chlorzoxazone     Chief Complaint  Patient presents with  . Acute Visit    having head/nasal congestion, burning eyes, hearing loss, cough and fever x 3 days.  she has been taking Tylenol Sinus with no relief.    HPI:  Sick since 3//6/15. Head congestion, sore throat, sinus pressure. Using Tylenol Sinus without relief. No nausea or diarrhea.   Medications: Patient's Medications  New Prescriptions   No medications on file  Previous Medications   ALBUTEROL (PROVENTIL HFA;VENTOLIN HFA) 108 (90 BASE) MCG/ACT INHALER    Inhale 2 puffs into the lungs every 6 (six) hours as needed. Shortness of breath.   AMLODIPINE (NORVASC) 5 MG TABLET    TAKE 1 TABLET BY MOUTH DAILY TO CONTROL BLOOD PRESSURE   DICYCLOMINE (BENTYL) 10 MG CAPSULE    Take 10 mg by mouth at bedtime.   HYDROCORTISONE (ANUSOL-HC) 2.5 % RECTAL CREAM    Place 1 application rectally daily as needed.   LATANOPROST (XALATAN) 0.005 % OPHTHALMIC SOLUTION    Place 1 drop into both eyes at bedtime.   LEVOTHYROXINE (SYNTHROID, LEVOTHROID) 75 MCG TABLET    Take 1 tablet (75 mcg total) by mouth daily before breakfast.   LISINOPRIL (PRINIVIL,ZESTRIL) 40 MG TABLET    TAKE 1 TABLET BY MOUTH DAILY   LORAZEPAM (ATIVAN) 0.5 MG TABLET    TAKE 2 TABLETS BY MOUTH EVERY EVENING AS NEEDED FOR ANXIETY   LOVASTATIN (MEVACOR) 20 MG TABLET    TAKE 1 TABLET BY MOUTH DAILY   MULTIPLE VITAMINS-MINERALS (ICAPS LUTEIN & OMEGA-3) CAPS    Take 1 capsule by mouth daily.   QUETIAPINE (SEROQUEL) 25 MG TABLET    TAKE 1 TABLET BY MOUTH TWICE DAILY   RISPERIDONE (RISPERDAL) 0.25 MG TABLET    Take 1 tablet (0.25 mg total) by mouth every evening.   VITAMIN B-12 (CYANOCOBALAMIN) 250 MCG TABLET    Take 250 mcg by mouth daily.   ZOSTER VACCINE LIVE, PF, (ZOSTAVAX) 35329  UNT/0.65ML INJECTION    Inject 19,400 Units into the skin once.  Modified Medications   No medications on file  Discontinued Medications   AMLODIPINE (NORVASC) 5 MG TABLET    TAKE 1 TABLET BY MOUTH DAILY TO CONTROL BLOOD PRESSURE   LISINOPRIL (PRINIVIL,ZESTRIL) 40 MG TABLET    TAKE 1 TABLET BY MOUTH DAILY     Review of Systems  Constitutional: Positive for fever and fatigue.  HENT: Positive for hearing loss, sinus pressure and sore throat.   Eyes: Positive for redness and itching.       Burnirg eyes  Respiratory: Positive for cough and shortness of breath.   Cardiovascular: Negative for chest pain, palpitations and leg swelling.  Endocrine: Negative.   Genitourinary: Negative for dysuria and frequency.  Musculoskeletal: Positive for arthralgias.  Allergic/Immunologic: Negative.   Neurological:       Memory loss  Hematological: Negative for adenopathy. Bruises/bleeds easily.    Filed Vitals:   01/13/14 1259  BP: 132/70  Pulse: 81  Temp: 97.4 F (36.3 C)  TempSrc: Oral  Resp: 16  Weight: 115 lb (52.164 kg)  SpO2: 94%   Physical Exam  Constitutional: She is oriented to person, place, and time. No distress.  Frail white female, walks independently but  does have cane  HENT:  Head: Normocephalic and atraumatic.  Eyes: Conjunctivae and EOM are normal. Pupils are equal, round, and reactive to light.  Corrective lenses  Neck: No JVD present. No tracheal deviation present. No thyromegaly present.  Cardiovascular: Normal rate, regular rhythm, normal heart sounds and intact distal pulses.  Exam reveals no gallop and no friction rub.   No murmur heard. Pulmonary/Chest: Effort normal. No respiratory distress. She has no wheezes. She has no rales. She exhibits no tenderness.  Few left-sided rhonchi  Abdominal: Soft. Bowel sounds are normal. She exhibits no distension and no mass. There is no tenderness.  Musculoskeletal: Normal range of motion. She exhibits no edema and no  tenderness.  Lymphadenopathy:    She has no cervical adenopathy.  Neurological: She is alert and oriented to person, place, and time. No cranial nerve deficit. Coordination normal.  Skin: Skin is warm and dry. No rash noted. No erythema. No pallor.  Psychiatric: She has a normal mood and affect. Her behavior is normal.     Labs reviewed: Appointment on 11/20/2013  Component Date Value Ref Range Status  . Cholesterol, Total 11/20/2013 147  100 - 199 mg/dL Final  . Triglycerides 11/20/2013 68  0 - 149 mg/dL Final  . HDL 16/10/960401/15/2015 56  >39 mg/dL Final   Comment: According to ATP-III Guidelines, HDL-C >59 mg/dL is considered a                          negative risk factor for CHD.  Marland Kitchen. VLDL Cholesterol Cal 11/20/2013 14  5 - 40 mg/dL Final  . LDL Calculated 11/20/2013 77  0 - 99 mg/dL Final  . Chol/HDL Ratio 11/20/2013 2.6  0.0 - 4.4 ratio units Final   Comment:                                   T. Chol/HDL Ratio                                                                      Men  Women                                                        1/2 Avg.Risk  3.4    3.3                                                            Avg.Risk  5.0    4.4  2X Avg.Risk  9.6    7.1                                                         3X Avg.Risk 23.4   11.0  . TSH 11/20/2013 6.250* 0.450 - 4.500 uIU/mL Final  . WBC 11/20/2013 5.2  3.4 - 10.8 x10E3/uL Final  . RBC 11/20/2013 3.84  3.77 - 5.28 x10E6/uL Final  . Hemoglobin 11/20/2013 11.8  11.1 - 15.9 g/dL Final  . HCT 95/28/4132 35.2  34.0 - 46.6 % Final  . MCV 11/20/2013 92  79 - 97 fL Final  . MCH 11/20/2013 30.7  26.6 - 33.0 pg Final  . MCHC 11/20/2013 33.5  31.5 - 35.7 g/dL Final  . RDW 44/11/270 13.6  12.3 - 15.4 % Final  . Neutrophils Relative % 11/20/2013 63   Final  . Lymphs 11/20/2013 24   Final  . Monocytes 11/20/2013 10   Final  . Eos 11/20/2013 3   Final  . Basos 11/20/2013  0   Final  . Neutrophils Absolute 11/20/2013 3.3  1.4 - 7.0 x10E3/uL Final  . Lymphocytes Absolute 11/20/2013 1.3  0.7 - 3.1 x10E3/uL Final  . Monocytes Absolute 11/20/2013 0.5  0.1 - 0.9 x10E3/uL Final  . Eosinophils Absolute 11/20/2013 0.1  0.0 - 0.4 x10E3/uL Final  . Basophils Absolute 11/20/2013 0.0  0.0 - 0.2 x10E3/uL Final  . Immature Granulocytes 11/20/2013 0   Final  . Immature Grans (Abs) 11/20/2013 0.0  0.0 - 0.1 x10E3/uL Final  . Glucose 11/20/2013 83  65 - 99 mg/dL Final  . BUN 53/66/4403 18  8 - 27 mg/dL Final  . Creatinine, Ser 11/20/2013 0.94  0.57 - 1.00 mg/dL Final  . GFR calc non Af Amer 11/20/2013 55* >59 mL/min/1.73 Final  . GFR calc Af Amer 11/20/2013 64  >59 mL/min/1.73 Final  . BUN/Creatinine Ratio 11/20/2013 19  11 - 26 Final  . Sodium 11/20/2013 142  134 - 144 mmol/L Final  . Potassium 11/20/2013 4.4  3.5 - 5.2 mmol/L Final  . Chloride 11/20/2013 104  97 - 108 mmol/L Final  . CO2 11/20/2013 23  18 - 29 mmol/L Final  . Calcium 11/20/2013 9.7  8.6 - 10.2 mg/dL Final   Comment: **Effective November 24, 2013 the reference interval**                            for Calcium, Serum will be changing to:                                       Age                Female          Female                                    0 - 10 days        8.6 - 10.4     8.6 - 10.4  11 days -  1 year        9.2 - 11.0     9.2 - 11.0                                    2 - 11 years       9.1 - 10.5     9.1 - 10.5                                   12 - 17 years       8.9 - 10.4     8.9 - 10.4                                   18 - 59 years       8.7 - 10.2     8.7 - 10.2                                       >59 years       8.6 - 10.2     8.7 - 10.3  . Vit D, 25-Hydroxy 11/20/2013 35.3  30.0 - 100.0 ng/mL Final   Comment: Vitamin D deficiency has been defined by the Institute of                          Medicine and an Endocrine Society practice guideline as a                           level of serum 25-OH vitamin D less than 20 ng/mL (1,2).                          The Endocrine Society went on to further define vitamin D                          insufficiency as a level between 21 and 29 ng/mL (2).                          1. IOM (Institute of Medicine). 2010. Dietary reference                             intakes for calcium and D. [place]: The                             [employer].                          2. Holick MF, Binkley Ramsey, Bischoff-Ferrari HA, et al.                             Evaluation, treatment, and prevention of vitamin D  deficiency: an Endocrine Society clinical practice                             guideline. JCEM. 2011 Jul; 96(7):1911-30.      Assessment/Plan 1. Acute upper respiratory infections of unspecified site Acute viral illness. Try Dayquil and Nyquil to help congestion. Drink plenty of fluids

## 2014-01-13 NOTE — Patient Instructions (Addendum)
Try Dayquil and Nyquil to help the congestion.  You have a virus that will likely take at least 2 weeks to resolve. Drink at least 2 quarts of fluids daily. Call if you run fevers or if the sputum changes in color.

## 2014-01-19 ENCOUNTER — Other Ambulatory Visit: Payer: Self-pay | Admitting: Internal Medicine

## 2014-01-26 ENCOUNTER — Emergency Department (HOSPITAL_COMMUNITY): Payer: Medicare Other

## 2014-01-26 ENCOUNTER — Encounter (HOSPITAL_COMMUNITY): Payer: Self-pay | Admitting: Emergency Medicine

## 2014-01-26 ENCOUNTER — Inpatient Hospital Stay (HOSPITAL_COMMUNITY)
Admission: EM | Admit: 2014-01-26 | Discharge: 2014-01-31 | DRG: 480 | Disposition: A | Discharge status: Skilled Nursing Facility | Payer: Medicare Other | Attending: Internal Medicine | Admitting: Internal Medicine

## 2014-01-26 DIAGNOSIS — E785 Hyperlipidemia, unspecified: Secondary | ICD-10-CM | POA: Diagnosis present

## 2014-01-26 DIAGNOSIS — M545 Low back pain, unspecified: Secondary | ICD-10-CM | POA: Diagnosis not present

## 2014-01-26 DIAGNOSIS — IMO0002 Reserved for concepts with insufficient information to code with codable children: Secondary | ICD-10-CM

## 2014-01-26 DIAGNOSIS — D509 Iron deficiency anemia, unspecified: Secondary | ICD-10-CM | POA: Diagnosis not present

## 2014-01-26 DIAGNOSIS — M810 Age-related osteoporosis without current pathological fracture: Secondary | ICD-10-CM | POA: Diagnosis present

## 2014-01-26 DIAGNOSIS — E440 Moderate protein-calorie malnutrition: Secondary | ICD-10-CM | POA: Diagnosis present

## 2014-01-26 DIAGNOSIS — S72009A Fracture of unspecified part of neck of unspecified femur, initial encounter for closed fracture: Secondary | ICD-10-CM | POA: Diagnosis not present

## 2014-01-26 DIAGNOSIS — F3289 Other specified depressive episodes: Secondary | ICD-10-CM

## 2014-01-26 DIAGNOSIS — S72143A Displaced intertrochanteric fracture of unspecified femur, initial encounter for closed fracture: Secondary | ICD-10-CM | POA: Diagnosis not present

## 2014-01-26 DIAGNOSIS — N179 Acute kidney failure, unspecified: Secondary | ICD-10-CM | POA: Diagnosis not present

## 2014-01-26 DIAGNOSIS — S72109A Unspecified trochanteric fracture of unspecified femur, initial encounter for closed fracture: Secondary | ICD-10-CM | POA: Diagnosis not present

## 2014-01-26 DIAGNOSIS — W010XXA Fall on same level from slipping, tripping and stumbling without subsequent striking against object, initial encounter: Secondary | ICD-10-CM | POA: Diagnosis present

## 2014-01-26 DIAGNOSIS — F32A Depression, unspecified: Secondary | ICD-10-CM | POA: Diagnosis present

## 2014-01-26 DIAGNOSIS — J438 Other emphysema: Secondary | ICD-10-CM | POA: Diagnosis present

## 2014-01-26 DIAGNOSIS — J690 Pneumonitis due to inhalation of food and vomit: Secondary | ICD-10-CM | POA: Diagnosis present

## 2014-01-26 DIAGNOSIS — E039 Hypothyroidism, unspecified: Secondary | ICD-10-CM | POA: Diagnosis present

## 2014-01-26 DIAGNOSIS — T148XXA Other injury of unspecified body region, initial encounter: Secondary | ICD-10-CM | POA: Diagnosis not present

## 2014-01-26 DIAGNOSIS — D72829 Elevated white blood cell count, unspecified: Secondary | ICD-10-CM | POA: Diagnosis present

## 2014-01-26 DIAGNOSIS — I498 Other specified cardiac arrhythmias: Secondary | ICD-10-CM | POA: Diagnosis not present

## 2014-01-26 DIAGNOSIS — S298XXA Other specified injuries of thorax, initial encounter: Secondary | ICD-10-CM | POA: Diagnosis not present

## 2014-01-26 DIAGNOSIS — S7223XA Displaced subtrochanteric fracture of unspecified femur, initial encounter for closed fracture: Secondary | ICD-10-CM | POA: Diagnosis not present

## 2014-01-26 DIAGNOSIS — F329 Major depressive disorder, single episode, unspecified: Secondary | ICD-10-CM | POA: Diagnosis present

## 2014-01-26 DIAGNOSIS — Z86711 Personal history of pulmonary embolism: Secondary | ICD-10-CM | POA: Diagnosis not present

## 2014-01-26 DIAGNOSIS — H9190 Unspecified hearing loss, unspecified ear: Secondary | ICD-10-CM | POA: Diagnosis present

## 2014-01-26 DIAGNOSIS — Z79899 Other long term (current) drug therapy: Secondary | ICD-10-CM | POA: Diagnosis not present

## 2014-01-26 DIAGNOSIS — G8911 Acute pain due to trauma: Secondary | ICD-10-CM | POA: Diagnosis not present

## 2014-01-26 DIAGNOSIS — R000 Tachycardia, unspecified: Secondary | ICD-10-CM | POA: Diagnosis not present

## 2014-01-26 DIAGNOSIS — Z87891 Personal history of nicotine dependence: Secondary | ICD-10-CM | POA: Diagnosis not present

## 2014-01-26 DIAGNOSIS — D62 Acute posthemorrhagic anemia: Secondary | ICD-10-CM | POA: Diagnosis not present

## 2014-01-26 DIAGNOSIS — Z66 Do not resuscitate: Secondary | ICD-10-CM | POA: Diagnosis present

## 2014-01-26 DIAGNOSIS — Z5189 Encounter for other specified aftercare: Secondary | ICD-10-CM | POA: Diagnosis not present

## 2014-01-26 DIAGNOSIS — J189 Pneumonia, unspecified organism: Secondary | ICD-10-CM

## 2014-01-26 DIAGNOSIS — J069 Acute upper respiratory infection, unspecified: Secondary | ICD-10-CM

## 2014-01-26 DIAGNOSIS — F411 Generalized anxiety disorder: Secondary | ICD-10-CM | POA: Diagnosis present

## 2014-01-26 DIAGNOSIS — I10 Essential (primary) hypertension: Secondary | ICD-10-CM | POA: Diagnosis not present

## 2014-01-26 DIAGNOSIS — E860 Dehydration: Secondary | ICD-10-CM | POA: Diagnosis present

## 2014-01-26 DIAGNOSIS — S72009D Fracture of unspecified part of neck of unspecified femur, subsequent encounter for closed fracture with routine healing: Secondary | ICD-10-CM | POA: Diagnosis not present

## 2014-01-26 DIAGNOSIS — M25559 Pain in unspecified hip: Secondary | ICD-10-CM | POA: Diagnosis not present

## 2014-01-26 DIAGNOSIS — F4541 Pain disorder exclusively related to psychological factors: Secondary | ICD-10-CM | POA: Diagnosis not present

## 2014-01-26 DIAGNOSIS — Z9181 History of falling: Secondary | ICD-10-CM | POA: Diagnosis not present

## 2014-01-26 LAB — ABO/RH: ABO/RH(D): O POS

## 2014-01-26 LAB — URINALYSIS, ROUTINE W REFLEX MICROSCOPIC
Glucose, UA: NEGATIVE mg/dL
Ketones, ur: 40 mg/dL — AB
LEUKOCYTES UA: NEGATIVE
Nitrite: NEGATIVE
PROTEIN: 30 mg/dL — AB
Specific Gravity, Urine: 1.02 (ref 1.005–1.030)
Urobilinogen, UA: 0.2 mg/dL (ref 0.0–1.0)
pH: 5.5 (ref 5.0–8.0)

## 2014-01-26 LAB — TROPONIN I: Troponin I: <0.3 ng/mL (ref <0.30)

## 2014-01-26 LAB — CBC WITH DIFFERENTIAL/PLATELET
BASOS ABS: 0 10*3/uL (ref 0.0–0.1)
Basophils Relative: 0 % (ref 0–1)
EOS ABS: 0 10*3/uL (ref 0.0–0.7)
EOS PCT: 0 % (ref 0–5)
HEMATOCRIT: 37.1 % (ref 36.0–46.0)
Hemoglobin: 12.9 g/dL (ref 12.0–15.0)
Lymphocytes Relative: 3 % — ABNORMAL LOW (ref 12–46)
Lymphs Abs: 0.7 10*3/uL (ref 0.7–4.0)
MCH: 30.6 pg (ref 26.0–34.0)
MCHC: 34.8 g/dL (ref 30.0–36.0)
MCV: 88.1 fL (ref 78.0–100.0)
MONO ABS: 1.8 10*3/uL — AB (ref 0.1–1.0)
Monocytes Relative: 8 % (ref 3–12)
Neutro Abs: 20.7 10*3/uL — ABNORMAL HIGH (ref 1.7–7.7)
Neutrophils Relative %: 89 % — ABNORMAL HIGH (ref 43–77)
PLATELETS: 374 10*3/uL (ref 150–400)
RBC: 4.21 MIL/uL (ref 3.87–5.11)
RDW: 12.5 % (ref 11.5–15.5)
WBC: 23.2 10*3/uL — AB (ref 4.0–10.5)

## 2014-01-26 LAB — URINE MICROSCOPIC-ADD ON

## 2014-01-26 LAB — BASIC METABOLIC PANEL
BUN: 22 mg/dL (ref 6–23)
CALCIUM: 9.6 mg/dL (ref 8.4–10.5)
CHLORIDE: 97 meq/L (ref 96–112)
CO2: 21 mEq/L (ref 19–32)
CREATININE: 0.79 mg/dL (ref 0.50–1.10)
GFR calc Af Amer: 85 mL/min — ABNORMAL LOW (ref >90)
GFR calc non Af Amer: 73 mL/min — ABNORMAL LOW (ref >90)
GLUCOSE: 139 mg/dL — AB (ref 70–99)
Potassium: 3.7 mEq/L (ref 3.7–5.3)
Sodium: 138 mEq/L (ref 137–147)

## 2014-01-26 LAB — PROTIME-INR
INR: 1.25 (ref 0.00–1.49)
PROTHROMBIN TIME: 15.4 s — AB (ref 11.6–15.2)

## 2014-01-26 LAB — TYPE AND SCREEN
ABO/RH(D): O POS
Antibody Screen: NEGATIVE

## 2014-01-26 LAB — CK: Total CK: 400 U/L — ABNORMAL HIGH (ref 7–177)

## 2014-01-26 MED ORDER — LABETALOL HCL 5 MG/ML IV SOLN
5.0000 mg | INTRAVENOUS | Status: DC | PRN
  Administered 2014-01-27: 5 mg via INTRAVENOUS
  Filled 2014-01-26: qty 4

## 2014-01-26 MED ORDER — SERTRALINE HCL 100 MG PO TABS
100.0000 mg | ORAL_TABLET | Freq: Every day | ORAL | Status: DC
  Administered 2014-01-26 – 2014-01-31 (×5): 100 mg via ORAL
  Filled 2014-01-26 (×6): qty 1

## 2014-01-26 MED ORDER — MORPHINE SULFATE 2 MG/ML IJ SOLN
2.0000 mg | INTRAMUSCULAR | Status: DC | PRN
  Administered 2014-01-26 – 2014-01-31 (×5): 2 mg via INTRAVENOUS
  Filled 2014-01-26 (×6): qty 1

## 2014-01-26 MED ORDER — AMLODIPINE BESYLATE 5 MG PO TABS
5.0000 mg | ORAL_TABLET | Freq: Every day | ORAL | Status: DC
  Administered 2014-01-26 – 2014-01-31 (×6): 5 mg via ORAL
  Filled 2014-01-26 (×7): qty 1

## 2014-01-26 MED ORDER — SODIUM CHLORIDE 0.9 % IV SOLN
INTRAVENOUS | Status: DC
  Administered 2014-01-26 – 2014-01-27 (×6): via INTRAVENOUS

## 2014-01-26 MED ORDER — LEVOTHYROXINE SODIUM 75 MCG PO TABS
75.0000 ug | ORAL_TABLET | Freq: Every day | ORAL | Status: DC
  Administered 2014-01-28 – 2014-01-31 (×4): 75 ug via ORAL
  Filled 2014-01-26 (×6): qty 1

## 2014-01-26 MED ORDER — ONDANSETRON HCL 4 MG/2ML IJ SOLN: 4.0000 mg | Freq: Four times a day (QID) | INTRAMUSCULAR | Status: DC | PRN

## 2014-01-26 MED ORDER — SODIUM CHLORIDE 0.9 % IJ SOLN
3.0000 mL | Freq: Two times a day (BID) | INTRAMUSCULAR | Status: DC
  Administered 2014-01-27 – 2014-01-31 (×4): 3 mL via INTRAVENOUS

## 2014-01-26 MED ORDER — LISINOPRIL 40 MG PO TABS
40.0000 mg | ORAL_TABLET | Freq: Every day | ORAL | Status: DC
  Administered 2014-01-26 – 2014-01-29 (×4): 40 mg via ORAL
  Filled 2014-01-26 (×5): qty 1

## 2014-01-26 MED ORDER — ADENOSINE 6 MG/2ML IV SOLN
INTRAVENOUS | Status: AC
  Administered 2014-01-26: 6 mg
  Filled 2014-01-26: qty 6

## 2014-01-26 MED ORDER — FENTANYL CITRATE 0.05 MG/ML IJ SOLN
50.0000 ug | INTRAMUSCULAR | Status: AC | PRN
  Administered 2014-01-26 (×2): 50 ug via INTRAVENOUS
  Filled 2014-01-26 (×2): qty 2

## 2014-01-26 MED ORDER — METOPROLOL TARTRATE 25 MG PO TABS
25.0000 mg | ORAL_TABLET | Freq: Two times a day (BID) | ORAL | Status: DC
  Administered 2014-01-26 – 2014-01-31 (×10): 25 mg via ORAL
  Filled 2014-01-26 (×11): qty 1

## 2014-01-26 MED ORDER — ONDANSETRON HCL 4 MG PO TABS: 4.0000 mg | ORAL_TABLET | Freq: Four times a day (QID) | ORAL | Status: DC | PRN

## 2014-01-26 MED ORDER — PANTOPRAZOLE SODIUM 40 MG PO TBEC
40.0000 mg | DELAYED_RELEASE_TABLET | ORAL | Status: DC
  Administered 2014-01-26 – 2014-01-30 (×3): 40 mg via ORAL
  Filled 2014-01-26 (×2): qty 1

## 2014-01-26 MED ORDER — QUETIAPINE FUMARATE 25 MG PO TABS
25.0000 mg | ORAL_TABLET | Freq: Two times a day (BID) | ORAL | Status: DC
  Administered 2014-01-26 – 2014-01-31 (×9): 25 mg via ORAL
  Filled 2014-01-26 (×11): qty 1

## 2014-01-26 MED ORDER — SIMVASTATIN 20 MG PO TABS
20.0000 mg | ORAL_TABLET | Freq: Every day | ORAL | Status: DC
  Administered 2014-01-27 – 2014-01-30 (×4): 20 mg via ORAL
  Filled 2014-01-26 (×5): qty 1

## 2014-01-26 MED ORDER — ALBUTEROL SULFATE HFA 108 (90 BASE) MCG/ACT IN AERS
2.0000 | INHALATION_SPRAY | Freq: Four times a day (QID) | RESPIRATORY_TRACT | Status: DC | PRN
Start: 2014-01-26 — End: 2014-01-31

## 2014-01-26 MED ORDER — LATANOPROST 0.005 % OP SOLN
1.0000 [drp] | Freq: Every day | OPHTHALMIC | Status: DC
  Administered 2014-01-26 – 2014-01-31 (×5): 1 [drp] via OPHTHALMIC
  Filled 2014-01-26 (×2): qty 2.5

## 2014-01-26 MED ORDER — ACETAMINOPHEN 650 MG RE SUPP: 650.0000 mg | Freq: Four times a day (QID) | RECTAL | Status: DC | PRN

## 2014-01-26 MED ORDER — RISPERIDONE 0.25 MG PO TABS
0.2500 mg | ORAL_TABLET | Freq: Every evening | ORAL | Status: DC
  Administered 2014-01-26 – 2014-01-30 (×5): 0.25 mg via ORAL
  Filled 2014-01-26 (×6): qty 1

## 2014-01-26 MED ORDER — ACETAMINOPHEN 325 MG PO TABS
650.0000 mg | ORAL_TABLET | Freq: Four times a day (QID) | ORAL | Status: DC | PRN
  Administered 2014-01-29: 650 mg via ORAL
  Filled 2014-01-26: qty 2

## 2014-01-26 MED ORDER — SODIUM CHLORIDE 0.9 % IV BOLUS (SEPSIS)
1000.0000 mL | Freq: Once | INTRAVENOUS | Status: AC
  Administered 2014-01-26: 1000 mL via INTRAVENOUS

## 2014-01-26 NOTE — H&P (Signed)
[facility] History and Physical  Nakiea Metzner NWG:[id number] DOB: [birth year] DOA: 01/26/2014  Referring physician: Emergency Department PCP: Bufford Spikes, DO  Specialists:   Chief Complaint: Hip pain  HPI: Jane [name] is a 80 y.o. female  With a hx of osteoporosis, HTN, and depression who presents to the ED following a mechanical fall at the facility and was apparently down for several hours before being found. The patient was brought to the ED where she complained of R hip pain. Plain film xrays demonstrated a nondisplaced R hip fx. Orthopedic surgery was consulted and the hospitalist service consulted for admission. Of note, the patient was also noted to be tachycardic with HR into the 130-140's. She did receive adenosine with continued SVT afterwards.  Review of Systems:  Per above, the remainder of the 10pt ros reviewed and are neg  Past Medical History  Diagnosis Date  . Abdominal pain, epigastric   . Impacted cerumen   . Abnormality of gait   . Anal fissure   . Loss of weight   . Hyposmolality and/or hyponatremia   . Pain in limb   . Other vitamin B12 deficiency anemia   . Chest pain, unspecified   . Anemia, unspecified   . Anxiety state, unspecified   . Abdominal pain, right upper quadrant   . Other pulmonary embolism and infarction   . Intestinal or peritoneal adhesions with obstruction (postoperative) (postinfection)   . Long term (current) use of anticoagulants   . Abdominal pain, right lower quadrant   . Essential and other specified forms of tremor   . Lumbago   . Unspecified hypothyroidism   . Major depressive disorder, single episode, unspecified   . Other and unspecified hyperlipidemia   . Macular degeneration (senile) of retina, unspecified   . Unspecified glaucoma   . Unspecified cataract   . Unspecified hearing loss   . Unspecified essential hypertension   . Osteoarthrosis, unspecified whether generalized or localized, unspecified site   .  Osteoporosis, unspecified   . Abnormal involuntary movements(781.0)   . Emphysema of lung    Past Surgical History  Procedure Laterality Date  . Excisional hemorrhoidectomy  1980  . Bladder suspension  1993  . Back surgery  2003  . Abdominal hysterectomy    . Trigger finger release  2009    Dr Teressa Senter   Social History:  reports that she has quit smoking. Her smoking use included Cigarettes. She smoked 0.00 packs per day for 40 years. She does not have any smokeless tobacco history on file. She reports that she does not drink alcohol or use illicit drugs.  where does patient live--home, ALF, SNF? and with whom if at home?  Can patient participate in ADLs?  Allergies  Allergen Reactions  . Celebrex [Celecoxib]     unknown  . Chlorzoxazone     unknown    Family History  Problem Relation Age of Onset  . Congestive Heart Failure Mother   . Macular degeneration Mother   . Heart disease Mother   . Cancer Sister     lung cancer  . Arthritis Daughter   . Heart disease Daughter   . Glaucoma Sister   . Cataracts Sister     (be sure to complete)  Prior to Admission medications   Medication Sig Start Date End Date Taking? Authorizing Provider  acetaminophen (TYLENOL) 650 MG CR tablet Take 650 mg by mouth every 8 (eight) hours as needed for pain.   Yes Historical Provider, MD  albuterol (  PROVENTIL HFA;VENTOLIN HFA) 108 (90 BASE) MCG/ACT inhaler Inhale 2 puffs into the lungs every 6 (six) hours as needed. Shortness of breath. 05/19/13  Yes Tiffany L Reed, DO  amLODipine (NORVASC) 5 MG tablet Take 5 mg by mouth daily.   Yes Historical Provider, MD  calcium carbonate (TUMS - DOSED IN MG ELEMENTAL CALCIUM) 500 MG chewable tablet Chew 1 tablet by mouth daily as needed for indigestion or heartburn.   Yes Historical Provider, MD  calcium-vitamin D (OSCAL WITH D) 500-200 MG-UNIT per tablet Take 1 tablet by mouth daily.   Yes Historical Provider, MD  Chlorphen-Pseudoephed-APAP (SINE-OFF PO) Take  1 tablet by mouth every 4 (four) hours as needed (cold and sinus symptoms).   Yes Historical Provider, MD  dicyclomine (BENTYL) 10 MG capsule Take 10 mg by mouth at bedtime.   Yes Historical Provider, MD  ferrous sulfate 325 (65 FE) MG tablet Take 325 mg by mouth every other day.   Yes Historical Provider, MD  fexofenadine (ALLEGRA) 180 MG tablet Take 180 mg by mouth daily as needed for allergies or rhinitis.   Yes Historical Provider, MD  hydrocortisone (ANUSOL-HC) 2.5 % rectal cream Place 1 application rectally daily as needed.   Yes Historical Provider, MD  latanoprost (XALATAN) 0.005 % ophthalmic solution Place 1 drop into both eyes at bedtime.   Yes Historical Provider, MD  levothyroxine (SYNTHROID, LEVOTHROID) 75 MCG tablet Take 1 tablet (75 mcg total) by mouth daily before breakfast. 11/24/13  Yes Tiffany L Reed, DO  lisinopril (PRINIVIL,ZESTRIL) 40 MG tablet Take 40 mg by mouth daily.   Yes Historical Provider, MD  LORazepam (ATIVAN) 0.5 MG tablet Take 1 mg by mouth at bedtime as needed for anxiety.   Yes Historical Provider, MD  lovastatin (MEVACOR) 20 MG tablet Take 20 mg by mouth every evening.   Yes Historical Provider, MD  Multiple Vitamins-Minerals (ICAPS LUTEIN & OMEGA-3) CAPS Take 1 capsule by mouth daily.   Yes Historical Provider, MD  pantoprazole (PROTONIX) 40 MG tablet Take 40 mg by mouth every other day.   Yes Historical Provider, MD  Pseudoeph-Doxylamine-DM-APAP (NYQUIL PO) Take 30 mLs by mouth at bedtime as needed (flu-like symptoms).   Yes Historical Provider, MD  Pseudoephedrine-APAP-DM (DAYQUIL PO) Take 2 tablets by mouth every 4 (four) hours as needed (flu-like symptoms).   Yes Historical Provider, MD  QUEtiapine (SEROQUEL) 25 MG tablet Take 25 mg by mouth 2 (two) times daily.   Yes Historical Provider, MD  risperiDONE (RISPERDAL) 0.25 MG tablet Take 0.25 mg by mouth every evening.   Yes Historical Provider, MD  sertraline (ZOLOFT) 100 MG tablet Take 100 mg by mouth daily.    Yes Historical Provider, MD  vitamin B-12 (CYANOCOBALAMIN) 100 MCG tablet Take 100 mcg by mouth daily.   Yes Historical Provider, MD   Physical Exam: Filed Vitals:   01/26/14 1730 01/26/14 1733 01/26/14 1736 01/26/14 1800  BP: 135/63   143/73  Pulse: 141 143 125   Temp:      TempSrc:      Resp: 21 29 25 28   SpO2:    100%     General:  Awake, in nad  Eyes: PERRL B  ENT: membranes dry, dentition fair  Neck: trachea midline, neck supple  Cardiovascular: tachycardic, s1, s2  Respiratory: normal resp effort, no wheezing  Abdomen: soft, nondistended  Skin: decreased skin turgor, no abnormal skin lesions seen  Musculoskeletal: perfused, no clubbing  Psychiatric: mood/affect normal// no auditory/visual hallucinations  Neurologic: cn2-12 grossly  intact, strength/sensation intact  Labs on Admission:  Basic Metabolic Panel:  Recent Labs Lab 01/26/14 1630  NA 138  K 3.7  CL 97  CO2 21  GLUCOSE 139*  BUN 22  CREATININE 0.79  CALCIUM 9.6   Liver Function Tests: No results found for this basename: AST, ALT, ALKPHOS, BILITOT, PROT, ALBUMIN,  in the last 168 hours No results found for this basename: LIPASE, AMYLASE,  in the last 168 hours No results found for this basename: AMMONIA,  in the last 168 hours CBC:  Recent Labs Lab 01/26/14 1630  WBC 23.2*  NEUTROABS 20.7*  HGB 12.9  HCT 37.1  MCV 88.1  PLT 374   Cardiac Enzymes:  Recent Labs Lab 01/26/14 1630  CKTOTAL 400*    BNP (last 3 results) No results found for this basename: PROBNP,  in the last 8760 hours CBG: No results found for this basename: GLUCAP,  in the last 168 hours  Radiological Exams on Admission: Dg Chest 1 View  01/26/2014   CLINICAL DATA:  Larey Seat.  Right hip fracture.  EXAM: CHEST - 1 VIEW  COMPARISON:  06/09/2010.  FINDINGS: The heart is mildly enlarged but stable. There is tortuosity and calcification of the thoracic aorta. There are underline emphysematous changes. Right basilar  airspace process is likely pneumonia or aspiration. No definite rib fractures or pneumothorax.  IMPRESSION: Underline emphysematous changes with superimposed right lower lobe process, likely pneumonia or aspiration.   Electronically Signed   By: Loralie Champagne M.D.   On: 01/26/2014 17:03   Dg Hip Complete Right  01/26/2014   CLINICAL DATA:  Larey Seat.  Right hip pain.  EXAM: RIGHT HIP - COMPLETE 2+ VIEW  COMPARISON:  None.  FINDINGS: Nondisplaced intertrochanteric fracture of the right hip is noted. The hip is normally located. The pubic symphysis and SI joints are grossly normal.  IMPRESSION: Nondisplaced intertrochanteric fracture of the right hip.   Electronically Signed   By: Loralie Champagne M.D.   On: 01/26/2014 17:01    EKG: Independently reviewed. SVT  Assessment/Plan Principal Problem:   Hip fracture Active Problems:   Senile osteoporosis   Depressive disorder, not elsewhere classified   Essential hypertension, benign   Tachycardia   1. Hip fx 1. Orthopedic surgery consulted through the ED 2. Continue with supportive care 3. Will transfer to [facility] per Orthopedic surgery recs 4. Medically stable for surgery once tachycardia improves and if pt remains stable(see below). 5. Admit to med-tele 2. HTN 1. Stable 2. Cont home meds 3. SVT 1. Will cont with IVF hydration 2. Hemodynamically stable 3. Will add PRN labetalol 4. Consider adding scheduled beta blocker as tolerated 5. Follow serial cardiac enzymes 6. Will check TSH 4. Hypothyroid 1. Check TSH 2. Continue thyroid replacement 5. DVT prophylaxis 1. scd's 6. Elevated CK 1. Secondary to being on ground for several hours 2. Hydrate per above   Code Status: DNR - confirmed with POA in room (must indicate code status--if unknown or must be presumed, indicate so) Family Communication: Pt in room (indicate person spoken with, if applicable, with phone number if by telephone) Disposition Plan: Pending (indicate anticipated  LOS)  Time spent:  Pietrina Jagodzinski K [facility] Pager [phone]  If 7PM-7AM, please contact night-coverage [email or web address] Password [email or web address] 01/26/2014, 6:18 PM

## 2014-01-26 NOTE — ED Notes (Signed)
After Adenosine 6 mg given IV/Patient's rhythm SR-84 then increased to 141 and regular. Now HR-139 -regular with occasional PVC's

## 2014-01-26 NOTE — ED Notes (Addendum)
Per EMS, Pt fell this morning after breakfast in her bathroom. Pt from [facility]. Pt was laying in bathroom for about 4-5 hours when neighbor heard her screaming for help. A&Ox4. Pt c/o R hip/groin pain and R lower back pain. Pt unable to straighten that leg out. Pt denies LOC, denies head and neck pain. Spine tender upon palpation but no obvious deformity. Pt has chronic back problems but sts "the pain is worse."

## 2014-01-26 NOTE — ED Notes (Signed)
Bed: IR67 Expected date:  Expected time:  Means of arrival:  Comments: EMS - Fall **Room 16**

## 2014-01-26 NOTE — ED Notes (Signed)
MD at bedside. Hospitalist present.

## 2014-01-26 NOTE — Progress Notes (Signed)
Clinical Social Work Department BRIEF PSYCHOSOCIAL ASSESSMENT 01/26/2014  Patient:  Jane [name], Jane [name]     Account Number:  [id number]     Admit date:  01/26/2014  Clinical Social Worker:  Luretha Rued  Date/Time:  01/26/2014 06:00 PM  Referred by:  CSW  Date Referred:  01/26/2014  Other Referral:   Interview type:  Patient Other interview type:   Daughter the POA was at bedside.    PSYCHOSOCIAL DATA Living Status:  FACILITY Admitted from facility:   Level of care:  Independent Living Primary support name:  Lars Mage Primary support relationship to patient:  CHILD, ADULT Degree of support available:   Patient lives in independent apartment with [facility].  Daughter at bedside.    CURRENT CONCERNS  Other Concerns:    SOCIAL WORK ASSESSMENT / PLAN CSW met with the patient and daughter at bedside to complete this assessment.  CSW engaged in conversation with the patient but she only response with knodding of the head.  Family reports this is the patient first fall that resulted in a broken bone and needing possible surgery. Patient is currently living in an independent apartment at [facility]. Family reports the patient recieves some assistance from staff with ADLs although living independently.  CSW provided the patient and family with SNF resource list for review.  Family reported the only facility she heard any information about was [facility] and do not have a preference at this time.   Assessment/plan status:  Psychosocial Support/Ongoing Assessment of Needs Other assessment/ plan:   Information/referral to community resources:   SNF    PATIENT'S/FAMILY'S RESPONSE TO PLAN OF CARE: Patient knodded his head agreeing with the information given and the daughter expressed her appreciation with the support of the social work department.       Chesley Noon, MSW, [place], 01/26/2014 Evening Clinical Social Worker [phone]

## 2014-01-26 NOTE — ED Notes (Signed)
Patient transported to X-ray 

## 2014-01-26 NOTE — ED Notes (Signed)
[facility] present on unit to take patient to [facility] Report given to [facility], EMT Paramedic

## 2014-01-26 NOTE — ED Notes (Signed)
Report given to [facility] RN [facility] called

## 2014-01-26 NOTE — ED Notes (Signed)
EMTALA form not completed by EDP EDP states that he does not know who the accepting MD at [facility] is, that [facility] (Dr. Rhona Leavens) arranged the transfer Both this nurse and the unit secretary have tried to page Dr. Rhona Leavens to resolve this issue, without success This nurse then called over to [facility] to speak with accepting nurse r/t not knowing accepting MD name [facility] RN did not know who the accepting MD was either Victorino Dike, RN Charge Nurse for [facility] ED made aware of this issue EMTALA completed with available information

## 2014-01-26 NOTE — ED Provider Notes (Signed)
CSN: [id number]     Arrival date & time 01/26/14  1554 History   First MD Initiated Contact with Patient 01/26/14 1558     No chief complaint on file.    (Consider location/radiation/quality/duration/timing/severity/associated sxs/prior Treatment) HPI Pt with long history of medical problems as below, report she slipped and fell in the bathroom during the night and was unable to get up. She denies head injury, complaining of R hip pain, worse with movement. She was on the floor for several hours until a neighbor heard her yelling.    Past Medical History  Diagnosis Date  . Abdominal pain, epigastric   . Impacted cerumen   . Abnormality of gait   . Anal fissure   . Loss of weight   . Hyposmolality and/or hyponatremia   . Pain in limb   . Other vitamin B12 deficiency anemia   . Chest pain, unspecified   . Anemia, unspecified   . Anxiety state, unspecified   . Abdominal pain, right upper quadrant   . Other pulmonary embolism and infarction   . Intestinal or peritoneal adhesions with obstruction (postoperative) (postinfection)   . Long term (current) use of anticoagulants   . Abdominal pain, right lower quadrant   . Essential and other specified forms of tremor   . Lumbago   . Unspecified hypothyroidism   . Major depressive disorder, single episode, unspecified   . Other and unspecified hyperlipidemia   . Macular degeneration (senile) of retina, unspecified   . Unspecified glaucoma   . Unspecified cataract   . Unspecified hearing loss   . Unspecified essential hypertension   . Osteoarthrosis, unspecified whether generalized or localized, unspecified site   . Osteoporosis, unspecified   . Abnormal involuntary movements(781.0)   . Emphysema of lung    Past Surgical History  Procedure Laterality Date  . Excisional hemorrhoidectomy  1980  . Bladder suspension  1993  . Back surgery  2003  . Abdominal hysterectomy    . Trigger finger release  2009    Dr Teressa Senter   Family  History  Problem Relation Age of Onset  . Congestive Heart Failure Mother   . Macular degeneration Mother   . Heart disease Mother   . Cancer Sister     lung cancer  . Arthritis Daughter   . Heart disease Daughter   . Glaucoma Sister   . Cataracts Sister    History  Substance Use Topics  . Smoking status: Former Smoker -- 40 years    Types: Cigarettes  . Smokeless tobacco: Not on file  . Alcohol Use: No   OB History   Grav Para Term Preterm Abortions TAB SAB Ect Mult Living                 Review of Systems All other systems reviewed and are negative except as noted in HPI.     Allergies  Celebrex and Chlorzoxazone  Home Medications   Current Outpatient Rx  Name  Route  Sig  Dispense  Refill  . amLODipine (NORVASC) 5 MG tablet      TAKE 1 TABLET BY MOUTH DAILY TO CONTROL BLOOD PRESSURE   90 tablet   1   . dicyclomine (BENTYL) 10 MG capsule   Oral   Take 10 mg by mouth at bedtime.         . ferrous sulfate 325 (65 FE) MG tablet   Oral   Take 325 mg by mouth every other day.         Marland Kitchen  levothyroxine (SYNTHROID, LEVOTHROID) 75 MCG tablet   Oral   Take 1 tablet (75 mcg total) by mouth daily before breakfast.   30 tablet   2   . lisinopril (PRINIVIL,ZESTRIL) 40 MG tablet      TAKE 1 TABLET BY MOUTH DAILY   90 tablet   1   . LORazepam (ATIVAN) 0.5 MG tablet      TAKE 2 TABLETS BY MOUTH EVERY EVENING AS NEEDED FOR ANXIETY   60 tablet   1   . lovastatin (MEVACOR) 20 MG tablet      TAKE 1 TABLET BY MOUTH DAILY   90 tablet   1     **Patient requests 90 days supply**   . pantoprazole (PROTONIX) 40 MG tablet   Oral   Take 40 mg by mouth every other day.         . sertraline (ZOLOFT) 100 MG tablet   Oral   Take 100 mg by mouth daily.         Marland Kitchen albuterol (PROVENTIL HFA;VENTOLIN HFA) 108 (90 BASE) MCG/ACT inhaler   Inhalation   Inhale 2 puffs into the lungs every 6 (six) hours as needed. Shortness of breath.   1 Inhaler   6   .  hydrocortisone (ANUSOL-HC) 2.5 % rectal cream   Rectal   Place 1 application rectally daily as needed.         . latanoprost (XALATAN) 0.005 % ophthalmic solution   Both Eyes   Place 1 drop into both eyes at bedtime.         . Multiple Vitamins-Minerals (ICAPS LUTEIN & OMEGA-3) CAPS   Oral   Take 1 capsule by mouth daily.         . risperiDONE (RISPERDAL) 0.25 MG tablet   Oral   Take 1 tablet (0.25 mg total) by mouth every evening.   30 tablet   1   . risperiDONE (RISPERDAL) 0.25 MG tablet      TAKE 1 TABLET BY MOUTH TWICE DAILY   180 tablet   0     **Patient requests 90 days supply**   . vitamin B-12 (CYANOCOBALAMIN) 250 MCG tablet   Oral   Take 250 mcg by mouth daily.         Marland Kitchen zoster vaccine live, PF, (ZOSTAVAX) 83662 UNT/0.65ML injection   Subcutaneous   Inject 19,400 Units into the skin once.   1 each   0    BP 155/76  Pulse 118  Temp(Src) 98.1 F (36.7 C) (Oral)  Resp 22  SpO2 92% Physical Exam  Nursing note and vitals reviewed. Constitutional: She is oriented to person, place, and time. She appears well-developed and well-nourished.  HENT:  Head: Normocephalic and atraumatic.  Eyes: EOM are normal. Pupils are equal, round, and reactive to light.  Neck: Normal range of motion. Neck supple.  Cardiovascular: Normal rate, normal heart sounds and intact distal pulses.   Pulmonary/Chest: Effort normal and breath sounds normal.  Abdominal: Bowel sounds are normal. She exhibits no distension. There is no tenderness.  Musculoskeletal: She exhibits tenderness (R hip). She exhibits no edema.  Unable to ROM R hip due to pain, NVI  Neurological: She is alert and oriented to person, place, and time. She has normal strength. No cranial nerve deficit or sensory deficit.  Skin: Skin is warm and dry. No rash noted.  Psychiatric: She has a normal mood and affect.    ED Course  Procedures (including critical care time) Labs Review Labs  Reviewed  BASIC  METABOLIC PANEL - Abnormal; Notable for the following:    Glucose, Bld 139 (*)    GFR calc non Af Amer 73 (*)    GFR calc Af Amer 85 (*)    All other components within normal limits  CBC WITH DIFFERENTIAL - Abnormal; Notable for the following:    WBC 23.2 (*)    Neutrophils Relative % 89 (*)    Neutro Abs 20.7 (*)    Lymphocytes Relative 3 (*)    Monocytes Absolute 1.8 (*)    All other components within normal limits  PROTIME-INR - Abnormal; Notable for the following:    Prothrombin Time 15.4 (*)    All other components within normal limits  URINALYSIS, ROUTINE W REFLEX MICROSCOPIC - Abnormal; Notable for the following:    Hgb urine dipstick MODERATE (*)    Bilirubin Urine SMALL (*)    Ketones, ur 40 (*)    Protein, ur 30 (*)    All other components within normal limits  CK - Abnormal; Notable for the following:    Total CK 400 (*)    All other components within normal limits  URINE MICROSCOPIC-ADD ON - Abnormal; Notable for the following:    Bacteria, UA MANY (*)    All other components within normal limits  COMPREHENSIVE METABOLIC PANEL  CBC  TSH  TROPONIN I  TROPONIN I  TROPONIN I  TYPE AND SCREEN  ABO/RH   Imaging Review Dg Chest 1 View  01/26/2014   CLINICAL DATA:  Larey Seat.  Right hip fracture.  EXAM: CHEST - 1 VIEW  COMPARISON:  06/09/2010.  FINDINGS: The heart is mildly enlarged but stable. There is tortuosity and calcification of the thoracic aorta. There are underline emphysematous changes. Right basilar airspace process is likely pneumonia or aspiration. No definite rib fractures or pneumothorax.  IMPRESSION: Underline emphysematous changes with superimposed right lower lobe process, likely pneumonia or aspiration.   Electronically Signed   By: Loralie Champagne M.D.   On: 01/26/2014 17:03   Dg Hip Complete Right  01/26/2014   CLINICAL DATA:  Larey Seat.  Right hip pain.  EXAM: RIGHT HIP - COMPLETE 2+ VIEW  COMPARISON:  None.  FINDINGS: Nondisplaced intertrochanteric fracture  of the right hip is noted. The hip is normally located. The pubic symphysis and SI joints are grossly normal.  IMPRESSION: Nondisplaced intertrochanteric fracture of the right hip.   Electronically Signed   By: Loralie Champagne M.D.   On: 01/26/2014 17:01    EKG in MUSE.   MDM   Final diagnoses:  Hip fracture, intertrochanteric  Tachycardia    Pt with hip fracture on xray, discussed with Dr. Eulah Pont on call for ortho requests patient be transferred to [facility] for OR tomorrow. HR significantly elevated by narrow complex and regular. Given Adenosine, no flutter waves seen, occasional P-waves. Likely a sinus tach, will give IVF and recheck. Discussed with hospitalist who will admit.     Charles B. Bernette Mayers, MD 01/26/14 2105

## 2014-01-27 ENCOUNTER — Encounter (HOSPITAL_COMMUNITY): Payer: Medicare Other | Admitting: Certified Registered Nurse Anesthetist

## 2014-01-27 ENCOUNTER — Encounter (HOSPITAL_COMMUNITY): Payer: Self-pay | Admitting: Certified Registered Nurse Anesthetist

## 2014-01-27 ENCOUNTER — Encounter (HOSPITAL_COMMUNITY)
Admission: EM | Disposition: A | Discharge status: Skilled Nursing Facility | Payer: Self-pay | Source: Home / Self Care | Attending: Internal Medicine

## 2014-01-27 ENCOUNTER — Inpatient Hospital Stay (HOSPITAL_COMMUNITY): Payer: Medicare Other

## 2014-01-27 ENCOUNTER — Inpatient Hospital Stay (HOSPITAL_COMMUNITY): Payer: Medicare Other | Admitting: Certified Registered Nurse Anesthetist

## 2014-01-27 DIAGNOSIS — S72143A Displaced intertrochanteric fracture of unspecified femur, initial encounter for closed fracture: Principal | ICD-10-CM

## 2014-01-27 DIAGNOSIS — F3289 Other specified depressive episodes: Secondary | ICD-10-CM

## 2014-01-27 DIAGNOSIS — J069 Acute upper respiratory infection, unspecified: Secondary | ICD-10-CM

## 2014-01-27 DIAGNOSIS — F329 Major depressive disorder, single episode, unspecified: Secondary | ICD-10-CM

## 2014-01-27 DIAGNOSIS — D509 Iron deficiency anemia, unspecified: Secondary | ICD-10-CM

## 2014-01-27 HISTORY — PX: INTRAMEDULLARY (IM) NAIL INTERTROCHANTERIC: SHX5875

## 2014-01-27 LAB — BASIC METABOLIC PANEL
BUN: 32 mg/dL — ABNORMAL HIGH (ref 6–23)
CO2: 19 meq/L (ref 19–32)
Calcium: 8.2 mg/dL — ABNORMAL LOW (ref 8.4–10.5)
Chloride: 103 mEq/L (ref 96–112)
Creatinine, Ser: 1.05 mg/dL (ref 0.50–1.10)
GFR calc Af Amer: 54 mL/min — ABNORMAL LOW (ref >90)
GFR calc non Af Amer: 47 mL/min — ABNORMAL LOW (ref >90)
GLUCOSE: 140 mg/dL — AB (ref 70–99)
POTASSIUM: 3.9 meq/L (ref 3.7–5.3)
SODIUM: 138 meq/L (ref 137–147)

## 2014-01-27 LAB — TSH: TSH: 0.851 u[IU]/mL (ref 0.350–4.500)

## 2014-01-27 LAB — STREP PNEUMONIAE URINARY ANTIGEN: STREP PNEUMO URINARY ANTIGEN: NEGATIVE

## 2014-01-27 SURGERY — FIXATION, FRACTURE, INTERTROCHANTERIC, WITH INTRAMEDULLARY ROD
Anesthesia: General | Site: Hip | Laterality: Right

## 2014-01-27 MED ORDER — LIDOCAINE HCL (CARDIAC) 20 MG/ML IV SOLN
INTRAVENOUS | Status: AC
  Filled 2014-01-27: qty 5

## 2014-01-27 MED ORDER — 0.9 % SODIUM CHLORIDE (POUR BTL) OPTIME
TOPICAL | Status: DC | PRN
  Administered 2014-01-27: 1000 mL

## 2014-01-27 MED ORDER — LACTATED RINGERS IV SOLN
INTRAVENOUS | Status: DC
  Administered 2014-01-27 – 2014-01-31 (×3): via INTRAVENOUS

## 2014-01-27 MED ORDER — PROPOFOL 10 MG/ML IV BOLUS
INTRAVENOUS | Status: DC | PRN
  Administered 2014-01-27: 60 mg via INTRAVENOUS

## 2014-01-27 MED ORDER — EPHEDRINE SULFATE 50 MG/ML IJ SOLN
INTRAMUSCULAR | Status: AC
  Filled 2014-01-27: qty 1

## 2014-01-27 MED ORDER — MENTHOL 3 MG MT LOZG
1.0000 | LOZENGE | OROMUCOSAL | Status: DC | PRN
  Filled 2014-01-27: qty 9

## 2014-01-27 MED ORDER — ROCURONIUM BROMIDE 50 MG/5ML IV SOLN
INTRAVENOUS | Status: AC
  Filled 2014-01-27: qty 1

## 2014-01-27 MED ORDER — ARTIFICIAL TEARS OP OINT
TOPICAL_OINTMENT | OPHTHALMIC | Status: AC
  Filled 2014-01-27: qty 3.5

## 2014-01-27 MED ORDER — PROPOFOL 10 MG/ML IV BOLUS
INTRAVENOUS | Status: AC
  Filled 2014-01-27: qty 20

## 2014-01-27 MED ORDER — GLYCOPYRROLATE 0.2 MG/ML IJ SOLN
INTRAMUSCULAR | Status: AC
  Filled 2014-01-27: qty 3

## 2014-01-27 MED ORDER — ARTIFICIAL TEARS OP OINT
TOPICAL_OINTMENT | OPHTHALMIC | Status: DC | PRN
  Administered 2014-01-27: 1 via OPHTHALMIC

## 2014-01-27 MED ORDER — METOCLOPRAMIDE HCL 5 MG PO TABS
5.0000 mg | ORAL_TABLET | Freq: Three times a day (TID) | ORAL | Status: DC | PRN
  Filled 2014-01-27: qty 2

## 2014-01-27 MED ORDER — LACTATED RINGERS IV SOLN
INTRAVENOUS | Status: DC | PRN
  Administered 2014-01-27: 12:00:00 via INTRAVENOUS

## 2014-01-27 MED ORDER — ASPIRIN EC 325 MG PO TBEC: 325.0000 mg | DELAYED_RELEASE_TABLET | Freq: Every day | ORAL | Status: DC

## 2014-01-27 MED ORDER — ASPIRIN EC 325 MG PO TBEC
325.0000 mg | DELAYED_RELEASE_TABLET | Freq: Every day | ORAL | Status: DC
Start: 2014-01-28 — End: 2014-01-31
  Administered 2014-01-28 – 2014-01-31 (×4): 325 mg via ORAL
  Filled 2014-01-27 (×5): qty 1

## 2014-01-27 MED ORDER — NEOSTIGMINE METHYLSULFATE 1 MG/ML IJ SOLN
INTRAMUSCULAR | Status: AC
  Filled 2014-01-27: qty 10

## 2014-01-27 MED ORDER — FENTANYL CITRATE 0.05 MG/ML IJ SOLN
INTRAMUSCULAR | Status: DC | PRN
  Administered 2014-01-27: 100 ug via INTRAVENOUS

## 2014-01-27 MED ORDER — ONDANSETRON HCL 4 MG/2ML IJ SOLN: 4.0000 mg | Freq: Once | INTRAMUSCULAR | Status: DC | PRN

## 2014-01-27 MED ORDER — EPHEDRINE SULFATE 50 MG/ML IJ SOLN
INTRAMUSCULAR | Status: DC | PRN
  Administered 2014-01-27: 10 mg via INTRAVENOUS

## 2014-01-27 MED ORDER — PHENOL 1.4 % MT LIQD
1.0000 | OROMUCOSAL | Status: DC | PRN
  Filled 2014-01-27: qty 177

## 2014-01-27 MED ORDER — HYDROCODONE-ACETAMINOPHEN 5-325 MG PO TABS: 2.0000 | ORAL_TABLET | ORAL | Status: DC | PRN

## 2014-01-27 MED ORDER — ONDANSETRON HCL 4 MG/2ML IJ SOLN
INTRAMUSCULAR | Status: AC
  Filled 2014-01-27: qty 2

## 2014-01-27 MED ORDER — ROCURONIUM BROMIDE 100 MG/10ML IV SOLN
INTRAVENOUS | Status: DC | PRN
  Administered 2014-01-27: 25 mg via INTRAVENOUS

## 2014-01-27 MED ORDER — DEXTROSE 5 % IV SOLN
1.0000 g | INTRAVENOUS | Status: DC
  Administered 2014-01-28 – 2014-01-30 (×3): 1 g via INTRAVENOUS
  Filled 2014-01-27 (×4): qty 10

## 2014-01-27 MED ORDER — DOCUSATE SODIUM 100 MG PO CAPS: 100.0000 mg | ORAL_CAPSULE | Freq: Two times a day (BID) | ORAL | Status: DC

## 2014-01-27 MED ORDER — ONDANSETRON HCL 4 MG/2ML IJ SOLN
INTRAMUSCULAR | Status: DC | PRN
  Administered 2014-01-27: 4 mg via INTRAVENOUS

## 2014-01-27 MED ORDER — GLYCOPYRROLATE 0.2 MG/ML IJ SOLN
INTRAMUSCULAR | Status: DC | PRN
  Administered 2014-01-27: 0.6 mg via INTRAVENOUS

## 2014-01-27 MED ORDER — DEXTROSE 5 % IV SOLN
500.0000 mg | INTRAVENOUS | Status: DC
  Administered 2014-01-27: 500 mg via INTRAVENOUS
  Filled 2014-01-27 (×2): qty 500

## 2014-01-27 MED ORDER — CEFAZOLIN SODIUM 1-5 GM-% IV SOLN
INTRAVENOUS | Status: DC | PRN
  Administered 2014-01-27: 1 g via INTRAVENOUS

## 2014-01-27 MED ORDER — NEOSTIGMINE METHYLSULFATE 1 MG/ML IJ SOLN
INTRAMUSCULAR | Status: DC | PRN
  Administered 2014-01-27: 3 mg via INTRAVENOUS

## 2014-01-27 MED ORDER — SODIUM CHLORIDE 0.9 % IJ SOLN
INTRAMUSCULAR | Status: AC
  Filled 2014-01-27: qty 10

## 2014-01-27 MED ORDER — PHENYLEPHRINE HCL 10 MG/ML IJ SOLN
INTRAMUSCULAR | Status: DC | PRN
  Administered 2014-01-27: 120 ug via INTRAVENOUS
  Administered 2014-01-27: 80 ug via INTRAVENOUS
  Administered 2014-01-27: 120 ug via INTRAVENOUS
  Administered 2014-01-27: 80 ug via INTRAVENOUS

## 2014-01-27 MED ORDER — DEXTROSE 5 % IV SOLN
1.0000 g | INTRAVENOUS | Status: DC
  Administered 2014-01-27 (×2): 1 g via INTRAVENOUS
  Filled 2014-01-27: qty 10

## 2014-01-27 MED ORDER — HYDROCODONE-ACETAMINOPHEN 5-325 MG PO TABS
1.0000 | ORAL_TABLET | Freq: Four times a day (QID) | ORAL | Status: DC | PRN
  Administered 2014-01-27 – 2014-01-31 (×11): 1 via ORAL
  Filled 2014-01-27 (×13): qty 1

## 2014-01-27 MED ORDER — LIDOCAINE HCL (CARDIAC) 20 MG/ML IV SOLN
INTRAVENOUS | Status: DC | PRN
  Administered 2014-01-27: 60 mg via INTRAVENOUS

## 2014-01-27 MED ORDER — CEFAZOLIN SODIUM-DEXTROSE 2-3 GM-% IV SOLR
2.0000 g | Freq: Four times a day (QID) | INTRAVENOUS | Status: AC
  Administered 2014-01-27 (×2): 2 g via INTRAVENOUS
  Filled 2014-01-27 (×2): qty 50

## 2014-01-27 MED ORDER — CEFAZOLIN SODIUM-DEXTROSE 2-3 GM-% IV SOLR
INTRAVENOUS | Status: AC
  Filled 2014-01-27: qty 50

## 2014-01-27 MED ORDER — FENTANYL CITRATE 0.05 MG/ML IJ SOLN
INTRAMUSCULAR | Status: AC
  Filled 2014-01-27: qty 5

## 2014-01-27 MED ORDER — PHENYLEPHRINE HCL 10 MG/ML IJ SOLN
10.0000 mg | INTRAVENOUS | Status: DC | PRN
  Administered 2014-01-27: 10 ug/min via INTRAVENOUS

## 2014-01-27 MED ORDER — HYDROMORPHONE HCL PF 1 MG/ML IJ SOLN: 0.2500 mg | INTRAMUSCULAR | Status: DC | PRN

## 2014-01-27 MED ORDER — METOCLOPRAMIDE HCL 5 MG/ML IJ SOLN
5.0000 mg | Freq: Three times a day (TID) | INTRAMUSCULAR | Status: DC | PRN
  Filled 2014-01-27: qty 2

## 2014-01-27 SURGICAL SUPPLY — 53 items
CLOSURE WOUND 1/2 X4 (GAUZE/BANDAGES/DRESSINGS) ×1
COVER MAYO STAND STRL (DRAPES) ×5 IMPLANT
COVER SURGICAL LIGHT HANDLE (MISCELLANEOUS) ×3 IMPLANT
DRAPE STERI IOBAN 125X83 (DRAPES) ×3 IMPLANT
DRSG ADAPTIC 3X8 NADH LF (GAUZE/BANDAGES/DRESSINGS) ×1 IMPLANT
DRSG MEPILEX BORDER 4X4 (GAUZE/BANDAGES/DRESSINGS) ×4 IMPLANT
DRSG TEGADERM 2-3/8X2-3/4 SM (GAUZE/BANDAGES/DRESSINGS) ×1 IMPLANT
DRSG TEGADERM 4X4.75 (GAUZE/BANDAGES/DRESSINGS) ×1 IMPLANT
DURAPREP 26ML APPLICATOR (WOUND CARE) ×3 IMPLANT
ELECT REM PT RETURN 9FT ADLT (ELECTROSURGICAL) ×3
ELECTRODE REM PT RTRN 9FT ADLT (ELECTROSURGICAL) ×1 IMPLANT
GAUZE XEROFORM 5X9 LF (GAUZE/BANDAGES/DRESSINGS) ×1 IMPLANT
GLOVE BIO SURGEON STRL SZ7.5 (GLOVE) ×3 IMPLANT
GLOVE BIOGEL PI IND STRL 6.5 (GLOVE) IMPLANT
GLOVE BIOGEL PI IND STRL 7.0 (GLOVE) IMPLANT
GLOVE BIOGEL PI IND STRL 7.5 (GLOVE) IMPLANT
GLOVE BIOGEL PI IND STRL 8 (GLOVE) ×1 IMPLANT
GLOVE BIOGEL PI INDICATOR 6.5 (GLOVE) ×2
GLOVE BIOGEL PI INDICATOR 7.0 (GLOVE) ×4
GLOVE BIOGEL PI INDICATOR 7.5 (GLOVE) ×2
GLOVE BIOGEL PI INDICATOR 8 (GLOVE) ×2
GLOVE BIOGEL PI ORTHO PRO SZ8 (GLOVE) ×2
GLOVE ECLIPSE 6.5 STRL STRAW (GLOVE) ×4 IMPLANT
GLOVE ECLIPSE 7.5 STRL STRAW (GLOVE) ×2 IMPLANT
GLOVE PI ORTHO PRO STRL SZ8 (GLOVE) IMPLANT
GLOVE SURG ORTHO 8.0 STRL STRW (GLOVE) ×2 IMPLANT
GLOVE SURG SS PI 6.5 STRL IVOR (GLOVE) ×2 IMPLANT
GOWN STRL REUS W/ TWL LRG LVL3 (GOWN DISPOSABLE) ×1 IMPLANT
GOWN STRL REUS W/ TWL XL LVL3 (GOWN DISPOSABLE) IMPLANT
GOWN STRL REUS W/TWL 2XL LVL3 (GOWN DISPOSABLE) ×4 IMPLANT
GOWN STRL REUS W/TWL LRG LVL3 (GOWN DISPOSABLE) ×6
GOWN STRL REUS W/TWL XL LVL3 (GOWN DISPOSABLE) ×6
GUIDEROD T2 3X1000 (ROD) ×2 IMPLANT
K-WIRE  3.2X450M STR (WIRE) ×2
K-WIRE 3.2X450M STR (WIRE) ×1
KIT ROOM TURNOVER OR (KITS) ×3 IMPLANT
KWIRE 3.2X450M STR (WIRE) IMPLANT
MANIFOLD NEPTUNE II (INSTRUMENTS) ×3 IMPLANT
NAIL GAMMA LG R 5TI 10X360X125 (Nail) ×2 IMPLANT
NS IRRIG 1000ML POUR BTL (IV SOLUTION) ×3 IMPLANT
PACK GENERAL/GYN (CUSTOM PROCEDURE TRAY) ×3 IMPLANT
PAD ARMBOARD 7.5X6 YLW CONV (MISCELLANEOUS) ×6 IMPLANT
SCREW LAG GAMMA 3 TI 10.5X85MM (Screw) ×2 IMPLANT
SPONGE GAUZE 4X4 12PLY (GAUZE/BANDAGES/DRESSINGS) ×3 IMPLANT
STAPLER VISISTAT 35W (STAPLE) ×3 IMPLANT
STRIP CLOSURE SKIN 1/2X4 (GAUZE/BANDAGES/DRESSINGS) ×1 IMPLANT
SUT MNCRL AB 4-0 PS2 18 (SUTURE) ×2 IMPLANT
SUT MON AB 2-0 CT1 27 (SUTURE) ×3 IMPLANT
SUT VIC AB 0 CT1 27 (SUTURE) ×3
SUT VIC AB 0 CT1 27XBRD ANBCTR (SUTURE) ×1 IMPLANT
TOWEL OR 17X24 6PK STRL BLUE (TOWEL DISPOSABLE) ×3 IMPLANT
TOWEL OR 17X26 10 PK STRL BLUE (TOWEL DISPOSABLE) ×3 IMPLANT
WATER STERILE IRR 1000ML POUR (IV SOLUTION) IMPLANT

## 2014-01-27 NOTE — Discharge Instructions (Signed)
Bear weight as tolerated  Take Aspirin 325 daily for 30 days

## 2014-01-27 NOTE — Care Management Note (Unsigned)
    Page 1 of 1   01/30/2014     5:34:36 PM   CARE MANAGEMENT NOTE 01/30/2014  Patient:  Jane [name], Jane [name]   Account Number:  [id number]  Date Initiated:  01/27/2014  Documentation initiated by:  Akiel Fennell  Subjective/Objective Assessment:   PT ADM S/P FALL WITH RT HIP FRACTURE, PNA, SVT.  PTA, PT RESIDED AT [facility].     Action/Plan:   CSW CONSULTED FOR LIKELY SNF PLACEMENT FOLLOWING HIP FX; SURGERY TODAY TO REPAIR HIP.  WILL FOLLOW PROGRESS.  WILL FOLLOW FOR PT/OT RECOMMENDATIONS.   Anticipated DC Date:  01/31/2014   Anticipated DC Plan:  SKILLED NURSING FACILITY  In-house referral  Clinical Social Worker      DC Planning Services  CM consult      Choice offered to / List presented to:             Status of service:  In process, will continue to follow Medicare Important Message given?   (If response is "NO", the following Medicare IM given date fields will be blank) Date Medicare IM given:   Date Additional Medicare IM given:    Discharge Disposition:    Per UR Regulation:  Reviewed for med. necessity/level of care/duration of stay  If discussed at Long Length of Stay Meetings, dates discussed:    Comments:  01/30/14 Therin Vetsch,RN,BSN [phone] PER CSW, FACILITY CAN ADMIT PT ON SATURDAY, IF MD FEELS PT IS MEDICALLY STABLE FOR DC.  PLEASE NOTIFY WEEKEND CSW IF W/E DC IS NEEDED.

## 2014-01-27 NOTE — Consult Note (Signed)
ORTHOPAEDIC CONSULTATION  REQUESTING PHYSICIAN: Theodis Blaze, MD  Chief Complaint: Right hip fracture  HPI: Jane [name] is a 80 y.o. female who complains of  R hip pain  Past Medical History  Diagnosis Date  . Abdominal pain, epigastric   . Impacted cerumen   . Abnormality of gait   . Anal fissure   . Loss of weight   . Hyposmolality and/or hyponatremia   . Pain in limb   . Other vitamin B12 deficiency anemia   . Chest pain, unspecified   . Anemia, unspecified   . Anxiety state, unspecified   . Abdominal pain, right upper quadrant   . Other pulmonary embolism and infarction   . Intestinal or peritoneal adhesions with obstruction (postoperative) (postinfection)   . Long term (current) use of anticoagulants   . Abdominal pain, right lower quadrant   . Essential and other specified forms of tremor   . Lumbago   . Unspecified hypothyroidism   . Major depressive disorder, single episode, unspecified   . Other and unspecified hyperlipidemia   . Macular degeneration (senile) of retina, unspecified   . Unspecified glaucoma   . Unspecified cataract   . Unspecified hearing loss   . Unspecified essential hypertension   . Osteoarthrosis, unspecified whether generalized or localized, unspecified site   . Osteoporosis, unspecified   . Abnormal involuntary movements(781.0)   . Emphysema of lung    Past Surgical History  Procedure Laterality Date  . Excisional hemorrhoidectomy  1980  . Bladder suspension  1993  . Back surgery  2003  . Abdominal hysterectomy    . Trigger finger release  2009    Dr Daylene Katayama   History   Social History  . Marital Status: Married    Spouse Name: N/A    Number of Children: N/A  . Years of Education: N/A   Social History Main Topics  . Smoking status: Former Smoker -- 40 years    Types: Cigarettes  . Smokeless tobacco: None  . Alcohol Use: No  . Drug Use: No  . Sexual Activity: None   Other Topics Concern  . None   Social  History Narrative  . None   Family History  Problem Relation Age of Onset  . Congestive Heart Failure Mother   . Macular degeneration Mother   . Heart disease Mother   . Cancer Sister     lung cancer  . Arthritis Daughter   . Heart disease Daughter   . Glaucoma Sister   . Cataracts Sister    Allergies  Allergen Reactions  . Celebrex [Celecoxib]     unknown  . Chlorzoxazone     unknown   Prior to Admission medications   Medication Sig Start Date End Date Taking? Authorizing Provider  acetaminophen (TYLENOL) 650 MG CR tablet Take 650 mg by mouth every 8 (eight) hours as needed for pain.   Yes Historical Provider, MD  albuterol (PROVENTIL HFA;VENTOLIN HFA) 108 (90 BASE) MCG/ACT inhaler Inhale 2 puffs into the lungs every 6 (six) hours as needed. Shortness of breath. 05/19/13  Yes Tiffany L Reed, DO  amLODipine (NORVASC) 5 MG tablet Take 5 mg by mouth daily.   Yes Historical Provider, MD  calcium carbonate (TUMS - DOSED IN MG ELEMENTAL CALCIUM) 500 MG chewable tablet Chew 1 tablet by mouth daily as needed for indigestion or heartburn.   Yes Historical Provider, MD  calcium-vitamin D (OSCAL WITH D) 500-200 MG-UNIT per tablet Take 1 tablet by mouth  daily.   Yes Historical Provider, MD  Chlorphen-Pseudoephed-APAP (SINE-OFF PO) Take 1 tablet by mouth every 4 (four) hours as needed (cold and sinus symptoms).   Yes Historical Provider, MD  dicyclomine (BENTYL) 10 MG capsule Take 10 mg by mouth at bedtime.   Yes Historical Provider, MD  ferrous sulfate 325 (65 FE) MG tablet Take 325 mg by mouth every other day.   Yes Historical Provider, MD  fexofenadine (ALLEGRA) 180 MG tablet Take 180 mg by mouth daily as needed for allergies or rhinitis.   Yes Historical Provider, MD  hydrocortisone (ANUSOL-HC) 2.5 % rectal cream Place 1 application rectally daily as needed.   Yes Historical Provider, MD  latanoprost (XALATAN) 0.005 % ophthalmic solution Place 1 drop into both eyes at bedtime.   Yes  Historical Provider, MD  levothyroxine (SYNTHROID, LEVOTHROID) 75 MCG tablet Take 1 tablet (75 mcg total) by mouth daily before breakfast. 11/24/13  Yes Tiffany L Reed, DO  lisinopril (PRINIVIL,ZESTRIL) 40 MG tablet Take 40 mg by mouth daily.   Yes Historical Provider, MD  LORazepam (ATIVAN) 0.5 MG tablet Take 1 mg by mouth at bedtime as needed for anxiety.   Yes Historical Provider, MD  lovastatin (MEVACOR) 20 MG tablet Take 20 mg by mouth every evening.   Yes Historical Provider, MD  Multiple Vitamins-Minerals (ICAPS LUTEIN & OMEGA-3) CAPS Take 1 capsule by mouth daily.   Yes Historical Provider, MD  pantoprazole (PROTONIX) 40 MG tablet Take 40 mg by mouth every other day.   Yes Historical Provider, MD  Pseudoeph-Doxylamine-DM-APAP (NYQUIL PO) Take 30 mLs by mouth at bedtime as needed (flu-like symptoms).   Yes Historical Provider, MD  Pseudoephedrine-APAP-DM (DAYQUIL PO) Take 2 tablets by mouth every 4 (four) hours as needed (flu-like symptoms).   Yes Historical Provider, MD  QUEtiapine (SEROQUEL) 25 MG tablet Take 25 mg by mouth 2 (two) times daily.   Yes Historical Provider, MD  risperiDONE (RISPERDAL) 0.25 MG tablet Take 0.25 mg by mouth every evening.   Yes Historical Provider, MD  sertraline (ZOLOFT) 100 MG tablet Take 100 mg by mouth daily.   Yes Historical Provider, MD  vitamin B-12 (CYANOCOBALAMIN) 100 MCG tablet Take 100 mcg by mouth daily.   Yes Historical Provider, MD   Dg Chest 1 View  01/26/2014   CLINICAL DATA:  [place].  Right hip fracture.  EXAM: CHEST - 1 VIEW  COMPARISON:  06/09/2010.  FINDINGS: The heart is mildly enlarged but stable. There is tortuosity and calcification of the thoracic aorta. There are underline emphysematous changes. Right basilar airspace process is likely pneumonia or aspiration. No definite rib fractures or pneumothorax.  IMPRESSION: Underline emphysematous changes with superimposed right lower lobe process, likely pneumonia or aspiration.   Electronically  Signed   By: Kalman Jewels M.D.   On: 01/26/2014 17:03   Dg Hip Complete Right  01/26/2014   CLINICAL DATA:  [place].  Right hip pain.  EXAM: RIGHT HIP - COMPLETE 2+ VIEW  COMPARISON:  None.  FINDINGS: Nondisplaced intertrochanteric fracture of the right hip is noted. The hip is normally located. The pubic symphysis and SI joints are grossly normal.  IMPRESSION: Nondisplaced intertrochanteric fracture of the right hip.   Electronically Signed   By: Kalman Jewels M.D.   On: 01/26/2014 17:01    Positive ROS: All other systems have been reviewed and were otherwise negative with the exception of those mentioned in the HPI and as above.  Labs cbc  Recent Labs  01/26/14 1630  WBC 23.2*  HGB 12.9  HCT 37.1  PLT 374    Labs inflam No results found for this basename: ESR, CRP,  in the last 72 hours  Labs coag  Recent Labs  01/26/14 1630  INR 1.25     Recent Labs  01/26/14 1630  NA 138  K 3.7  CL 97  CO2 21  GLUCOSE 139*  BUN 22  CREATININE 0.79  CALCIUM 9.6    Physical Exam: Filed Vitals:   01/27/14 0439  BP: 95/55  Pulse: 84  Temp: 99 F (37.2 C)  Resp: 18   General: Alert, no acute distress Cardiovascular: No pedal edema Respiratory: No cyanosis, no use of accessory musculature GI: No organomegaly, abdomen is soft and non-tender Skin: No lesions in the area of chief complaint Neurologic: Sensation intact distally Psychiatric: Patient is competent for consent with normal mood and affect Lymphatic: No axillary or cervical lymphadenopathy  MUSCULOSKELETAL:  Skin benign, NVI, pain with log roll Other extremities are atraumatic with painless ROM and NVI.  Assessment: R intertroch fracture  Plan: IM nail today Weight Bearing Status: WBAT post op PT VTE px: SCD's and ASA 325 daily   Edmonia Lynch, D, MD Cell [phone]   01/27/2014 9:50 AM

## 2014-01-27 NOTE — Transfer of Care (Signed)
Immediate Anesthesia Transfer of Care Note  Patient: Jane [name]  Procedure(s) Performed: Procedure(s): INTRAMEDULLARY (IM) NAIL INTERTROCHANTRIC (Right)  Patient Location: PACU  Anesthesia Type:General  Level of Consciousness: awake, alert  and patient cooperative  Airway & Oxygen Therapy: Patient Spontanous Breathing and Patient connected to nasal cannula oxygen  Post-op Assessment: Report given to PACU RN, Post -op Vital signs reviewed and stable and Patient moving all extremities  Post vital signs: Reviewed and stable  Complications: No apparent anesthesia complications

## 2014-01-27 NOTE — Progress Notes (Addendum)
Clinical Social Work Department CLINICAL SOCIAL WORK PLACEMENT NOTE 01/27/2014  Patient:  CAIRO, LINGENFELTER  Account Number:  [id number] Admit date:  01/26/2014  Clinical Social Worker:  Carren Rang  Date/time:  01/27/2014 10:23 AM  Clinical Social Work is seeking post-discharge placement for this patient at the following level of care:   SKILLED NURSING   (*CSW will update this form in Epic as items are completed)   01/27/2014  Patient/family provided with [facility] Department of Clinical Social Work's list of facilities offering this level of care within the geographic area requested by the patient (or if unable, by the patient's family).  01/27/2014  Patient/family informed of their freedom to choose among providers that offer the needed level of care, that participate in Medicare, Medicaid or managed care program needed by the patient, have an available bed and are willing to accept the patient.  01/27/2014  Patient/family informed of [facility]' ownership interest in [facility], as well as of the fact that they are under no obligation to receive care at this facility.  PASARR submitted to EDS on  PASARR number received from EDS on   FL2 transmitted to all facilities in geographic area requested by pt/family on  01/27/2014 FL2 transmitted to all facilities within larger geographic area on   Patient informed that his/her managed care company has contracts with or will negotiate with  certain facilities, including the following:     Patient/family informed of bed offers received:  02/01/2014 Patient chooses bed at [facility] Physician recommends and patient chooses bed at    Patient to be transferred to [facility] on  01/31/14-Layten Aiken [place], LCSWA Patient to be transferred to facility by [facility]  The following physician request were entered in Epic:   Additional Comments: Patient has previous PASRR  Maree Krabbe, MSW, [place] [phone]

## 2014-01-27 NOTE — Anesthesia Postprocedure Evaluation (Signed)
  Anesthesia Post-op Note  Patient: Jane [name]  Procedure(s) Performed: Procedure(s): INTRAMEDULLARY (IM) NAIL INTERTROCHANTRIC (Right)  Patient Location: PACU  Anesthesia Type:General  Level of Consciousness: oriented, sedated and patient cooperative  Airway and Oxygen Therapy: Patient Spontanous Breathing  Post-op Pain: mild  Post-op Assessment: Post-op Vital signs reviewed, Patient's Cardiovascular Status Stable, Respiratory Function Stable, Patent Airway, No signs of Nausea or vomiting and Pain level controlled  Post-op Vital Signs: stable  Complications: No apparent anesthesia complications

## 2014-01-27 NOTE — Progress Notes (Signed)
Patient ID: Jane [name], female   DOB: 1925/02/07, 80 y.o.   MRN: [id number]  [facility] PROGRESS NOTE  Jane [name] GEX:[id number] DOB: 1925-01-23 DOA: 01/26/2014 PCP: Bufford Spikes, DO  Brief narrative: 80 y.o. female with a hx of osteoporosis, HTN, and depression presented to the ED following a mechanical fall at the facility and was apparently down for several hours before being found. The patient was brought to the ED where she complained of R hip pain. Plain film xrays demonstrated a nondisplaced R hip fx. Orthopedic surgery was consulted and the hospitalist service consulted for admission.   Principal Problem:   Hip fracture - IM nail planned for today  - pt is currently comfortable and denies pain  - appreciate ortho input - aspirin and SCD's for DVT prophylaxis Active Problems:   ? CAP vs aspiration PNA - given significant leukocytosis and low grade fever with CXR with possible PNA, will treat with Zithro and Rocephin - order sputum culture, urine legionella and strep pneumo   Depressive disorder, not elsewhere classified - appears to be at baseline   Essential hypertension, benign - reasonable inpatient control    Moderate malnutrition - secondary to progressive deconditioning - nutrition consultation   Consultants:  Ortho   Procedures/Studies: Dg Chest 1 View  01/26/2014  Underline emphysematous changes with superimposed right lower lobe process, likely pneumonia or aspiration.    Dg Hip Complete Right   01/26/2014  Nondisplaced intertrochanteric fracture of the right hip.     Antibiotics:  Zithromax 3/24 -->  Rocephin 3/24 -->  Code Status: Full Family Communication: Pt and daughter at bedside Disposition Plan: Remains inpatient   HPI/Subjective: No events overnight.   Objective: Filed Vitals:   01/26/14 2115 01/26/14 2159 01/26/14 2341 01/27/14 0439  BP: 151/59 143/64 153/62 95/55  Pulse:  105 103 84  Temp:  99 F (37.2 C)  99 F (37.2 C)   TempSrc:  Oral  Oral  Resp: 32 22  18  Height:  5\' 3"  (1.6 m)    Weight:  53.5 kg (117 lb 15.1 oz)    SpO2:  94%  96%    Intake/Output Summary (Last 24 hours) at 01/27/14 0845 Last data filed at 01/27/14 01/29/14  Gross per 24 hour  Intake 1143.33 ml  Output    300 ml  Net 843.33 ml    Exam:   General:  Pt is alert, follows commands appropriately, not in acute distress  Cardiovascular: Regular rate and rhythm, S1/S2, no murmurs, no rubs, no gallops  Respiratory: Clear to auscultation bilaterally, no wheezing, bibasilar rhonchi   Abdomen: Soft, non tender, non distended, bowel sounds present, no guarding  Extremities: No edema, pulses DP and PT palpable bilaterally, tender at the right hip area   Neuro: Grossly nonfocal, alert and oriented to name only   Data Reviewed: Basic Metabolic Panel:  Recent Labs Lab 01/26/14 1630  NA 138  K 3.7  CL 97  CO2 21  GLUCOSE 139*  BUN 22  CREATININE 0.79  CALCIUM 9.6   CBC:  Recent Labs Lab 01/26/14 1630  WBC 23.2*  NEUTROABS 20.7*  HGB 12.9  HCT 37.1  MCV 88.1  PLT 374   Cardiac Enzymes:  Recent Labs Lab 01/26/14 1630 01/26/14 2038  CKTOTAL 400*  --   TROPONINI  --  <0.30    Scheduled Meds: . amLODipine  5 mg Oral Daily  . latanoprost  1 drop Both Eyes QHS  . levothyroxine  75 mcg Oral  QAC breakfast  . lisinopril  40 mg Oral Daily  . metoprolol tartrate  25 mg Oral BID  . pantoprazole  40 mg Oral QODAY  . QUEtiapine  25 mg Oral BID  . risperiDONE  0.25 mg Oral QPM  . sertraline  100 mg Oral Daily  . simvastatin  20 mg Oral q1800  . sodium chloride  3 mL Intravenous Q12H   Continuous Infusions: . sodium chloride 100 mL/hr at 01/27/14 3734     Debbora Presto, MD  [facility] Pager [phone]  If 7PM-7AM, please contact night-coverage [email or web address] Password [email or web address] 01/27/2014, 8:45 AM   LOS: 1 day

## 2014-01-27 NOTE — Anesthesia Procedure Notes (Signed)
Procedure Name: Intubation Date/Time: 01/27/2014 11:44 AM Performed by: Angelica Pou Pre-anesthesia Checklist: Patient identified, Patient being monitored, Timeout performed, Emergency Drugs available and Suction available Patient Re-evaluated:Patient Re-evaluated prior to inductionOxygen Delivery Method: Circle system utilized Preoxygenation: Pre-oxygenation with 100% oxygen Intubation Type: IV induction Ventilation: Mask ventilation without difficulty Laryngoscope Size: Mac and 3 Grade View: Grade I Tube type: Oral Tube size: 7.0 mm Number of attempts: 1 Airway Equipment and Method: Stylet Placement Confirmation: ETT inserted through vocal cords under direct vision,  breath sounds checked- equal and bilateral and positive ETCO2 Secured at: 22 cm Tube secured with: Tape Dental Injury: Teeth and Oropharynx as per pre-operative assessment

## 2014-01-27 NOTE — Op Note (Signed)
DATE OF SURGERY:  01/27/2014  TIME: 12:34 PM  PATIENT NAME:  Jane [name]  AGE: 80 y.o.  PRE-OPERATIVE DIAGNOSIS:  Right Hip Fracture  POST-OPERATIVE DIAGNOSIS:  SAME  PROCEDURE:  INTRAMEDULLARY (IM) NAIL INTERTROCHANTRIC  SURGEON:  Belford Pascucci, D  ASSISTANT:  Janace Litten OPA  OPERATIVE IMPLANTS: Stryker Gamma Nail with distal interlock screw  PREOPERATIVE INDICATIONS:  [name] is a 80 y.o. year old who fell and suffered a hip fracture. She was brought into the ER and then admitted and optimized and then elected for surgical intervention.    The risks benefits and alternatives were discussed with the patient including but not limited to the risks of nonoperative treatment, versus surgical intervention including infection, bleeding, nerve injury, malunion, nonunion, hardware prominence, hardware failure, need for hardware removal, blood clots, cardiopulmonary complications, morbidity, mortality, among others, and they were willing to proceed.    OPERATIVE PROCEDURE:  The patient was brought to the operating room and placed in the supine position. General anesthesia was administered, with a foley. She was placed on the fracture table.  Closed reduction was performed under C-arm guidance. The length of the femur was also measured using fluoroscopy. Time out was then performed after sterile prep and drape. She received preoperative antibiotics.  Incision was made proximal to the greater trochanter. A guidewire was placed in the appropriate position. Confirmation was made on AP and lateral views. The above-named nail was opened. I opened the proximal femur with a reamer. I then placed the nail by hand easily down. I did not need to ream the femur.  Once the nail was completely seated, I placed a guidepin into the femoral head into the center center position. I measured the length, and then reamed the lateral cortex and up into the head. I then placed the lag screw. Slight  compression was applied. Anatomic fixation achieved. Bone quality was mediocre.  I then secured the proximal interlocking bolt, and took off a half a turn, and then removed the instruments, and took final C-arm pictures AP and lateral the entire length of the leg.   Anatomic reconstruction was achieved, and the wounds were irrigated copiously and closed with Vicryl followed by staples and sterile gauze for the skin. The patient was awakened and returned to PACU in stable and satisfactory condition. There no complications and the patient tolerated the procedure well.  She will be weightbearing as tolerated, and will be on ASA 325  for a period of four weeks after discharge.   Margarita Rana, M.D.

## 2014-01-27 NOTE — Anesthesia Preprocedure Evaluation (Signed)
Anesthesia Evaluation  Patient identified by MRN, date of birth, ID band Patient awake    Reviewed: Allergy & Precautions, H&P , NPO status , Patient's Chart, lab work & pertinent test results  Airway       Dental   Pulmonary COPDformer smoker,          Cardiovascular hypertension,     Neuro/Psych Anxiety Depression  Neuromuscular disease    GI/Hepatic hiatal hernia,   Endo/Other  Hypothyroidism   Renal/GU      Musculoskeletal   Abdominal   Peds  Hematology  (+) anemia ,   Anesthesia Other Findings   Reproductive/Obstetrics                           Anesthesia Physical Anesthesia Plan  ASA: III  Anesthesia Plan: General   Post-op Pain Management:    Induction: Intravenous  Airway Management Planned: Oral ETT  Additional Equipment:   Intra-op Plan:   Post-operative Plan: Extubation in OR  Informed Consent: I have reviewed the patients History and Physical, chart, labs and discussed the procedure including the risks, benefits and alternatives for the proposed anesthesia with the patient or authorized representative who has indicated his/her understanding and acceptance.     Plan Discussed with:   Anesthesia Plan Comments:         Anesthesia Quick Evaluation

## 2014-01-27 NOTE — Preoperative (Addendum)
Beta Blockers   Reason not to administer Beta Blockers:Not Applicable, received metoprolol 03/23 2345 per MAR.

## 2014-01-28 ENCOUNTER — Encounter (HOSPITAL_COMMUNITY): Payer: Self-pay | Admitting: Orthopedic Surgery

## 2014-01-28 LAB — CBC
HCT: 28.2 % — ABNORMAL LOW (ref 36.0–46.0)
Hemoglobin: 9.5 g/dL — ABNORMAL LOW (ref 12.0–15.0)
MCH: 30.8 pg (ref 26.0–34.0)
MCHC: 33.7 g/dL (ref 30.0–36.0)
MCV: 91.6 fL (ref 78.0–100.0)
Platelets: 270 10*3/uL (ref 150–400)
RBC: 3.08 MIL/uL — ABNORMAL LOW (ref 3.87–5.11)
RDW: 12.9 % (ref 11.5–15.5)
WBC: 13.8 10*3/uL — AB (ref 4.0–10.5)

## 2014-01-28 LAB — LEGIONELLA ANTIGEN, URINE: Legionella Antigen, Urine: NEGATIVE

## 2014-01-28 MED ORDER — AZITHROMYCIN 500 MG PO TABS
500.0000 mg | ORAL_TABLET | Freq: Every day | ORAL | Status: DC
  Administered 2014-01-28 – 2014-01-31 (×4): 500 mg via ORAL
  Filled 2014-01-28 (×4): qty 1

## 2014-01-28 NOTE — Progress Notes (Addendum)
Patient ID: Jane [name], female   DOB: 09/06/1925, 80 y.o.   MRN: [id number]  [facility] PROGRESS NOTE  Jane [name] RWE:[id number] DOB: [birth year] DOA: 01/26/2014 PCP: Bufford Spikes, DO  Brief narrative: 80 y.o. female with a hx of osteoporosis, HTN, and depression presented to the ED following a mechanical fall at the facility and was apparently down for several hours before being found. The patient was brought to the ED where she complained of R hip pain. Plain film xrays demonstrated a nondisplaced R hip fx. Orthopedic surgery was consulted and the hospitalist service consulted for admission.   Hip fracture- IM nail 3/24 - pt is currently comfortable and denies pain  - appreciate ortho input - aspirin and SCD's for DVT prophylaxis Expected ABLA- Hb 9.5 this morning, repeat in am. No overt bleeding, some dilutional component as well.  CAP vs aspiration PNA- given significant leukocytosis and low grade fever with CXR with possible PNA, will treat with Zithro and Rocephin - order sputum culture, urine legionella and strep pneumo - leukocytosis improving today, narrow to Levaquin on d/c. Depressive disorder, not elsewhere classified - appears to be at baseline Essential hypertension, benign- reasonable inpatient control  Moderate malnutrition- secondary to progressive deconditioning  Consultants:  Ortho   Procedures/Studies: Dg Chest 1 View  01/26/2014  Underline emphysematous changes with superimposed right lower lobe process, likely pneumonia or aspiration.    Dg Hip Complete Right   01/26/2014  Nondisplaced intertrochanteric fracture of the right hip.     Antibiotics:  Zithromax 3/24 -->  Rocephin 3/24 -->  Code Status: Full Family Communication: Pt and daughter at bedside Disposition Plan: Remains inpatient   HPI/Subjective: No events overnight.   Objective: Filed Vitals:   01/27/14 2300 01/28/14 0422 01/28/14 1059 01/28/14 1402  BP: 98/53 120/64 108/54 97/59   Pulse: 86 91 98 90  Temp:  97.9 F (36.6 C)  97.3 F (36.3 C)  TempSrc:  Oral  Oral  Resp:  18  18  Height:      Weight:      SpO2:  91%  86%    Intake/Output Summary (Last 24 hours) at 01/28/14 1444 Last data filed at 01/28/14 0730  Gross per 24 hour  Intake    240 ml  Output    550 ml  Net   -310 ml   Exam:  General:  Pt is alert, follows commands appropriately, not in acute distress  Cardiovascular: Regular rate and rhythm, S1/S2, no murmurs, no rubs, no gallops  Respiratory: Clear to auscultation bilaterally, no wheezing, bibasilar rhonchi   Abdomen: Soft, non tender, non distended, bowel sounds present, no guarding  Extremities: No edema, pulses DP and PT palpable bilaterally, tender at the right hip area   Neuro: Grossly nonfocal, alert and oriented to name only   Data Reviewed: Basic Metabolic Panel:  Recent Labs Lab 01/26/14 1630 01/27/14 2040  NA 138 138  K 3.7 3.9  CL 97 103  CO2 21 19  GLUCOSE 139* 140*  BUN 22 32*  CREATININE 0.79 1.05  CALCIUM 9.6 8.2*   CBC:  Recent Labs Lab 01/26/14 1630 01/28/14 0256  WBC 23.2* 13.8*  NEUTROABS 20.7*  --   HGB 12.9 9.5*  HCT 37.1 28.2*  MCV 88.1 91.6  PLT 374 270   Cardiac Enzymes:  Recent Labs Lab 01/26/14 1630 01/26/14 2038  CKTOTAL 400*  --   TROPONINI  --  <0.30   Scheduled Meds: . amLODipine  5 mg Oral Daily  .  aspirin EC  325 mg Oral Q breakfast  . azithromycin  500 mg Oral Daily  . cefTRIAXone (ROCEPHIN)  IV  1 g Intravenous Q24H  . latanoprost  1 drop Both Eyes QHS  . levothyroxine  75 mcg Oral QAC breakfast  . lisinopril  40 mg Oral Daily  . metoprolol tartrate  25 mg Oral BID  . pantoprazole  40 mg Oral QODAY  . QUEtiapine  25 mg Oral BID  . risperiDONE  0.25 mg Oral QPM  . sertraline  100 mg Oral Daily  . simvastatin  20 mg Oral q1800  . sodium chloride  3 mL Intravenous Q12H   Continuous Infusions: . sodium chloride 30 mL/hr at 01/27/14 2330  . lactated ringers 50  mL/hr at 01/27/14 1051   Pamella Pert, MD  Pager [phone]  Time spent: 25  minutes  If 7PM-7AM, please contact night-coverage [email or web address] Password [email or web address] 01/28/2014, 2:44 PM   LOS: 2 days

## 2014-01-28 NOTE — Progress Notes (Signed)
OT Cancellation Note  Patient Details Name: Jane [name] MRN: [id number] DOB: [birth year]   Cancelled Treatment:    Reason Eval/Treat Not Completed: Other (comment) PT to D/C to SNF. Will defer OT to SNF. If eval needed for admission, please text page OT at [phone] or call [phone]. Thank you. [facility] Makhiya Coburn, OTR/L  [phone] 01/28/2014 01/28/2014, 3:25 PM

## 2014-01-28 NOTE — Clinical Documentation Improvement (Signed)
01/28/14  Dear Dr. Eulah Pont Marton Redwood,   Possible Clinical Conditions?    Expected Acute Blood Loss Anemia  Acute Blood Loss Anemia  Acute on chronic blood loss anemia  Precipitous drop in Hematocrit  Other Condition  Cannot Clinically Determine   Risk Factors: History of anemia noted per 3/23 H&P.  Diagnostics: H&H on 3/23:  12.9/37.1 H&H on 3/25:   9.5/28.2   Thank You, Marciano Sequin, Clinical Documentation Specialist:  [phone]  [facility]- Health Information Management

## 2014-01-28 NOTE — Progress Notes (Signed)
Patient ID: Jane [name], female   DOB: [birth year], 80 y.o.   MRN: [id number]     Subjective:  Patient reports pain as mild to moderate.  Patient sitting up in bed and in no acute distress.  Follows commands and is aware of time and place  Objective:   VITALS:   Filed Vitals:   01/27/14 1656 01/27/14 2201 01/27/14 2300 01/28/14 0422  BP: 103/44 97/53 98/53  120/64  Pulse: 59 88 86 91  Temp: 98.2 F (36.8 C) 97.9 F (36.6 C)  97.9 F (36.6 C)  TempSrc:  Oral  Oral  Resp:  18  18  Height:      Weight:      SpO2: 96% 94%  91%    ABD soft Sensation intact distally Dorsiflexion/Plantar flexion intact Incision: dressing C/D/I and no drainage   Lab Results  Component Value Date   WBC 13.8* 01/28/2014   HGB 9.5* 01/28/2014   HCT 28.2* 01/28/2014   MCV 91.6 01/28/2014   PLT 270 01/28/2014     Assessment/Plan: 1 Day Post-Op   Principal Problem:   Hip fracture Active Problems:   Senile osteoporosis   Depressive disorder, not elsewhere classified   Essential hypertension, benign   Tachycardia   Advance diet Up with therapy Continue plan per medicine WBAT Dry dressing PRN    Haskel Khan 01/28/2014, 9:03 AM   Margarita Rana MD [phone]

## 2014-01-28 NOTE — Evaluation (Signed)
Physical Therapy Evaluation Patient Details Name: Jane [name] MRN: [id number] DOB: 05-10-1925 Today's Date: 01/28/2014   History of Present Illness  fall resulting in hip fx; pt is s/p IM nail   Clinical Impression  Patient is s/p surgery listed above due to fall, resulting in functional limitations due to the deficits listed below (see PT Problem List).  Patient will benefit from skilled PT to increase their independence and safety with mobility to allow discharge to the venue listed below. Pt requires incr time for all mobility due to pain and anxiety. Will require SNF for post acute rehab prior to returning to her apartment at independent living.      Follow Up Recommendations SNF;Supervision/Assistance - 24 hour    Equipment Recommendations  Rolling walker with 5" wheels;3in1 (PT)    Recommendations for Other Services OT consult     Precautions / Restrictions Precautions Precautions: Fall Restrictions Weight Bearing Restrictions: Yes LLE Weight Bearing: Weight bearing as tolerated      Mobility  Bed Mobility Overal bed mobility: Needs Assistance Bed Mobility: Supine to Sit     Supine to sit: Max assist;HOB elevated     General bed mobility comments: pt requires incr time due to pain; yells out in pain throughout; cues for sequencing; able to (A) herself minimally with UEs on handrails; use of draw pad to bring LEs to/off EOB; max cues for deep breathing and sequencing   Transfers Overall transfer level: Needs assistance Equipment used: 2 person hand held assist Transfers: Sit to/from [employer] Transfers Sit to Stand: +2 physical assistance;Max assist Stand pivot transfers: +2 physical assistance;Max assist       General transfer comment: pt very anxious with transfers; requries 2+ (A) to maintain balance and facilitate pivotal steps; pt keeps trunk fwd flex and LEs flexed; max cues for sequencing                                          Balance Overall balance assessment: Needs assistance;History of Falls Sitting-balance support: Bilateral upper extremity supported;Feet supported Sitting balance-Leahy Scale: Poor Sitting balance - Comments: pt leaning to Lt and posteriorly; relies heavily on UEs supported to maintain balance sitting EOB; Sat EOB ~5 min prior to attempting transfer  Postural control: Posterior lean;Left lateral lean Standing balance support: During functional activity;Bilateral upper extremity supported Standing balance-Leahy Scale: Zero Standing balance comment: requires 2 person max (A) to maintain balance                      Pertinent Vitals/Pain 10/10; patient repositioned for comfort     Home Living Family/patient expects to be discharged to:: Skilled nursing facility                 Additional Comments: pt from independent living; reports multiple falls; will require SNF upon D/C    Prior Function Level of Independence: Independent with assistive device(s)         Comments: per pt; she was independent with all ADLs prior to admission and ambulated with cane or RW outside of apartment      Hand Dominance   Dominant Hand: Right    Extremity/Trunk Assessment   Upper Extremity Assessment: Defer to OT evaluation           Lower Extremity Assessment: RLE deficits/detail RLE Deficits / Details: limited by pain; was able to perform ankle  pumps      Cervical / Trunk Assessment: Kyphotic  Communication   Communication: HOH  Cognition Arousal/Alertness: Awake/alert Behavior During Therapy: Anxious Overall Cognitive Status: Within Functional Limits for tasks assessed                      General Comments      Exercises General Exercises - Lower Extremity Ankle Circles/Pumps: AROM;Both;10 reps;Seated      Assessment/Plan    PT Assessment Patient needs continued PT services  PT Diagnosis Difficulty walking;Generalized weakness;Acute pain   PT  Problem List Decreased strength;Decreased range of motion;Decreased activity tolerance;Decreased balance;Decreased mobility;Decreased knowledge of use of DME;Decreased safety awareness;Decreased knowledge of precautions;Pain;Impaired sensation  PT Treatment Interventions DME instruction;Gait training;Functional mobility training;Therapeutic activities;Therapeutic exercise;Balance training;Neuromuscular re-education;Patient/family education;Wheelchair mobility training   PT Goals (Current goals can be found in the Care Plan section) Acute Rehab PT Goals Patient Stated Goal: to not be like this PT Goal Formulation: With patient Time For Goal Achievement: 02/11/14 Potential to Achieve Goals: Good    Frequency Min 3X/week   Barriers to discharge Decreased caregiver support was at INdependent living; will require SNF upon D/C    End of Session Equipment Utilized During Treatment: Gait belt;Oxygen (3L) Activity Tolerance: Patient limited by fatigue;Patient limited by pain Patient left: in chair;with chair alarm set;with call bell/phone within reach         Time: 3428-7681 PT Time Calculation (min): 28 min   Charges:   PT Evaluation $Initial PT Evaluation Tier I: 1 Procedure PT Treatments $Therapeutic Activity: 23-37 mins   PT G CodesDonell Sievert, [place] [phone] 01/28/2014, 10:08 AM

## 2014-01-28 NOTE — Progress Notes (Signed)
CSW provided bed offers to patient's daughter, POA. Patient's daughter states her preferences are either [address], [facility], or [facility] [address]. Patient's daughter states she would like to think about her offers and get back with social worker later today.  Maree Krabbe, MSW, Theresia Majors [phone]

## 2014-01-28 NOTE — Progress Notes (Signed)
I participated in the care of this patient and agree with the above history, physical and evaluation. I performed a review of the history and a physical exam as detailed   Timothy Daniel Murphy MD  

## 2014-01-28 NOTE — Progress Notes (Signed)
Orthopedic Tech Progress Note Patient Details:  Jane [name] January 03, 1925 [id number]  Patient ID: Jane [name], female   DOB: 31-May-1925, 80 y.o.   MRN: [id number] RN stated that pt is unable to use ohf  Nikki Dom 01/28/2014, 1:55 PM

## 2014-01-29 ENCOUNTER — Telehealth: Payer: Self-pay | Admitting: *Deleted

## 2014-01-29 ENCOUNTER — Inpatient Hospital Stay (HOSPITAL_COMMUNITY): Payer: Medicare Other

## 2014-01-29 LAB — BASIC METABOLIC PANEL
BUN: 44 mg/dL — AB (ref 6–23)
CO2: 21 mEq/L (ref 19–32)
Calcium: 8.7 mg/dL (ref 8.4–10.5)
Chloride: 106 mEq/L (ref 96–112)
Creatinine, Ser: 1.21 mg/dL — ABNORMAL HIGH (ref 0.50–1.10)
GFR calc Af Amer: 46 mL/min — ABNORMAL LOW (ref >90)
GFR calc non Af Amer: 39 mL/min — ABNORMAL LOW (ref >90)
GLUCOSE: 125 mg/dL — AB (ref 70–99)
POTASSIUM: 4.2 meq/L (ref 3.7–5.3)
Sodium: 141 mEq/L (ref 137–147)

## 2014-01-29 LAB — CBC
HCT: 27.1 % — ABNORMAL LOW (ref 36.0–46.0)
Hemoglobin: 9.1 g/dL — ABNORMAL LOW (ref 12.0–15.0)
MCH: 30.8 pg (ref 26.0–34.0)
MCHC: 33.6 g/dL (ref 30.0–36.0)
MCV: 91.9 fL (ref 78.0–100.0)
Platelets: 254 10*3/uL (ref 150–400)
RBC: 2.95 MIL/uL — AB (ref 3.87–5.11)
RDW: 12.9 % (ref 11.5–15.5)
WBC: 13.1 10*3/uL — AB (ref 4.0–10.5)

## 2014-01-29 MED ORDER — SODIUM CHLORIDE 0.9 % IV SOLN
INTRAVENOUS | Status: AC
  Administered 2014-01-29 (×2): via INTRAVENOUS

## 2014-01-29 MED ORDER — ENSURE PUDDING PO PUDG
1.0000 | Freq: Three times a day (TID) | ORAL | Status: DC
  Administered 2014-01-30 – 2014-01-31 (×4): 1 via ORAL

## 2014-01-29 MED ORDER — RESOURCE THICKENUP CLEAR PO POWD
ORAL | Status: DC | PRN
Start: 2014-01-29 — End: 2014-01-31
  Filled 2014-01-29: qty 125

## 2014-01-29 NOTE — Progress Notes (Signed)
CSW (Clinical Child psychotherapist) spoke with pt daughter about dc plan. CSW had been informed that pt daughter was wanting a call from [facility] admissions so CSW had asked facility this morning to contact pt daughter. Pt daughter confirmed that she talked to facility and she would be meeting with them tomorrow morning. CSW informed pt daughter that CSW was informed of likely dc tomorrow, pt daughter already aware.  Linell Meldrum, LCSWA [phone]

## 2014-01-29 NOTE — Procedures (Signed)
Objective Swallowing Evaluation: Modified Barium Swallowing Study  Patient Details  Name: Jane [name] MRN: [id number] Date of Birth: [birth year]  Today's Date: 01/29/2014 Time: 1410-1440 SLP Time Calculation (min): 30 min  Past Medical History:  Past Medical History  Diagnosis Date  . Abdominal pain, epigastric   . Impacted cerumen   . Abnormality of gait   . Anal fissure   . Loss of weight   . Hyposmolality and/or hyponatremia   . Pain in limb   . Other vitamin B12 deficiency anemia   . Chest pain, unspecified   . Anemia, unspecified   . Anxiety state, unspecified   . Abdominal pain, right upper quadrant   . Other pulmonary embolism and infarction   . Intestinal or peritoneal adhesions with obstruction (postoperative) (postinfection)   . Long term (current) use of anticoagulants   . Abdominal pain, right lower quadrant   . Essential and other specified forms of tremor   . Lumbago   . Unspecified hypothyroidism   . Major depressive disorder, single episode, unspecified   . Other and unspecified hyperlipidemia   . Macular degeneration (senile) of retina, unspecified   . Unspecified glaucoma   . Unspecified cataract   . Unspecified hearing loss   . Unspecified essential hypertension   . Osteoarthrosis, unspecified whether generalized or localized, unspecified site   . Osteoporosis, unspecified   . Abnormal involuntary movements(781.0)   . Emphysema of lung    Past Surgical History:  Past Surgical History  Procedure Laterality Date  . Excisional hemorrhoidectomy  1980  . Bladder suspension  1993  . Back surgery  2003  . Abdominal hysterectomy    . Trigger finger release  2009    Dr Teressa Senter  . Intramedullary (im) nail intertrochanteric Right 01/27/2014    Procedure: INTRAMEDULLARY (IM) NAIL INTERTROCHANTRIC;  Surgeon: Sheral Apley, MD;  Location: [facility] OR;  Service: Orthopedics;  Laterality: Right;   HPI:  80 y.o. female with a hx of osteoporosis, HTN, and depression  presented to the ED following a mechanical fall at the facility and was apparently down for several hours before being found. The patient was brought to the ED where she complained of R hip pain. Plain film xrays demonstrated a nondisplaced R hip fx. Orthopedic surgery was consulted and the hospitalist service consulted for admission.  Pt has had leukocytosis, low grade fever and possible pna, concern for aspiration.      Assessment / Plan / Recommendation Clinical Impression  Dysphagia Diagnosis: Moderate pharyngeal phase dysphagia;Mild oral phase dysphagia Clinical impression: Pt presetns with a mild oral phase dysphagia with slow but complete posterior propulsion of solid boluses. The oropharyngeal phase was initially [facility], but as study progressed pt continually had decreased airway closure with trace penetration of residuals and then mild to moderate silent aspiration during the swallow with straw and cup sips of thin liquids. Postures were ineffective and pts mentation also seemed to decline slightly during the test as well, making complex strategies impossible. Overall pt will beneift from short term nectar thick liquids and mech soft solids to aid in airway protection during recovery period following hip fx. Expect upgrade as strength and functional reserve improve. Pt may need repeat MBS for upgrade    Treatment Recommendation  Therapy as outlined in treatment plan below    Diet Recommendation Dysphagia 3 (Mechanical Soft);Nectar-thick liquid   Liquid Administration via: Cup;No straw Medication Administration: Whole meds with liquid Supervision: Patient able to self feed Compensations: Slow rate;Small sips/bites;Clear throat intermittently  Postural Changes and/or Swallow Maneuvers: Seated upright 90 degrees    Other  Recommendations Oral Care Recommendations: Oral care BID Other Recommendations: Order thickener from pharmacy   Follow Up Recommendations  Skilled Nursing facility     Frequency and Duration min 2x/week  2 weeks   Pertinent Vitals/Pain NA    SLP Swallow Goals     General HPI: 80 y.o. female with a hx of osteoporosis, HTN, and depression presented to the ED following a mechanical fall at the facility and was apparently down for several hours before being found. The patient was brought to the ED where she complained of R hip pain. Plain film xrays demonstrated a nondisplaced R hip fx. Orthopedic surgery was consulted and the hospitalist service consulted for admission.  Pt has had leukocytosis, low grade fever and possible pna, concern for aspiration.  Type of Study: Modified Barium Swallowing Study Reason for Referral: Objectively evaluate swallowing function Previous Swallow Assessment: none in chart Diet Prior to this Study: Regular;Thin liquids Temperature Spikes Noted: Yes Respiratory Status: Nasal cannula History of Recent Intubation: No Behavior/Cognition: Alert;Cooperative;Pleasant mood Oral Cavity - Dentition: Dentures, top;Dentures, bottom Oral Motor / Sensory Function: Within functional limits Self-Feeding Abilities: Able to feed self Patient Positioning: Partially reclined Baseline Vocal Quality: Clear Volitional Cough: Congested Volitional Swallow: Able to elicit Anatomy: Within functional limits    Reason for Referral Objectively evaluate swallowing function   Oral Phase Oral Preparation/Oral Phase Oral Phase: Impaired Oral - Nectar Oral - Nectar Cup: Lingual pumping;Reduced posterior propulsion;Delayed oral transit Oral - Thin Oral - Thin Cup: Lingual pumping;Reduced posterior propulsion;Delayed oral transit Oral - Thin Straw: Lingual pumping;Reduced posterior propulsion;Delayed oral transit Oral - Solids Oral - Puree: Lingual pumping;Reduced posterior propulsion;Delayed oral transit Oral - Mechanical Soft: Lingual pumping;Reduced posterior propulsion;Delayed oral transit Oral - Pill: Lingual pumping;Reduced posterior  propulsion;Delayed oral transit   Pharyngeal Phase Pharyngeal Phase Pharyngeal Phase: Impaired Pharyngeal - Nectar Pharyngeal - Nectar Cup: Delayed swallow initiation Pharyngeal - Thin Pharyngeal - Thin Cup: Delayed swallow initiation;Reduced airway/laryngeal closure;Penetration/Aspiration during swallow;Penetration/Aspiration after swallow;Moderate aspiration;Pharyngeal residue - valleculae Penetration/Aspiration details (thin cup): Material enters airway, passes BELOW cords without attempt by patient to eject out (silent aspiration);Material does not enter airway;Material enters airway, CONTACTS cords and not ejected out Pharyngeal - Thin Straw: Delayed swallow initiation;Reduced airway/laryngeal closure;Penetration/Aspiration during swallow;Penetration/Aspiration after swallow;Moderate aspiration;Pharyngeal residue - valleculae Penetration/Aspiration details (thin straw): Material enters airway, passes BELOW cords without attempt by patient to eject out (silent aspiration);Material does not enter airway;Material enters airway, CONTACTS cords and not ejected out Pharyngeal - Solids Pharyngeal - Puree: Within functional limits Pharyngeal - Mechanical Soft: Within functional limits  Cervical Esophageal Phase    GO             Harlon Ditty, MA CCC-SLP [phone]  Claudine Mouton 01/29/2014, 3:37 PM

## 2014-01-29 NOTE — Progress Notes (Signed)
INITIAL NUTRITION ASSESSMENT  DOCUMENTATION CODES Per approved criteria  -Not Applicable   INTERVENTION: Ensure Pudding po TID, each supplement provides 170 kcal and 4 grams of protein RD to follow for nutrition care plan  NUTRITION DIAGNOSIS: Inadequate oral intake related to poor appetite as evidenced by PO intake 10-25%   Goal: Pt to meet >/= 90% of their estimated nutrition needs   Monitor:  PO & supplemental intake, weight, labs, I/O's  Reason for Assessment: Malnutrition Screening Tool Report  80 y.o. female  Admitting Dx: Hip fracture  ASSESSMENT: 80 y.o. Female with PMH of osteoporosis, HTN, and depression who presented to ED following a mechanical fall at the facility and was apparently down for several hours before being found.   The patient was brought to the ED where she complained of R hip pain. Plain film xrays demonstrated a nondisplaced R hip fx.   Patient confused upon RD visit; s/p MBSS today -- SLP recommending Dys 3, nectar thick liquid diet given moderate-mild dysphagia; PO intake poor at 10-25% per flowsheet records; weight has been stable per wt readings; would benefit from addition of nutrition supplements -- RD to order.  Nutrition focused physical exam completed.  No muscle or subcutaneous fat depletion noticed.  Height: Ht Readings from Last 1 Encounters:  01/26/14 5\' 3"  (1.6 m)    Weight: Wt Readings from Last 1 Encounters:  01/26/14 117 lb 15.1 oz (53.5 kg)    Ideal Body Weight: 115 lb  % Ideal Body Weight: 98%  Wt Readings from Last 10 Encounters:  01/26/14 117 lb 15.1 oz (53.5 kg)  01/26/14 117 lb 15.1 oz (53.5 kg)  01/13/14 115 lb (52.164 kg)  11/24/13 116 lb (52.617 kg)  08/18/13 114 lb (51.71 kg)  06/19/13 110 lb 3.2 oz (49.986 kg)  06/04/13 106 lb 3.2 oz (48.172 kg)  05/19/13 109 lb 3.2 oz (49.533 kg)  02/13/13 104 lb 3.2 oz (47.265 kg)    Usual Body Weight: 114 lb  % Usual Body Weight: 103%  BMI:  Body mass index is  20.9 kg/(m^2).  Estimated Nutritional Needs: Kcal: 1200-1400 Protein: 50-60 gm Fluid: >/= 1.5 L  Skin: Intact  Diet Order: Dysphagia, nectar-thick liquids  EDUCATION NEEDS: -No education needs identified at this time   Intake/Output Summary (Last 24 hours) at 01/29/14 1538 Last data filed at 01/29/14 1230  Gross per 24 hour  Intake    480 ml  Output   1025 ml  Net   -545 ml    Labs:   Recent Labs Lab 01/26/14 1630 01/27/14 2040 01/29/14 0319  NA 138 138 141  K 3.7 3.9 4.2  CL 97 103 106  CO2 21 19 21   BUN 22 32* 44*  CREATININE 0.79 1.05 1.21*  CALCIUM 9.6 8.2* 8.7  GLUCOSE 139* 140* 125*    Scheduled Meds: . amLODipine  5 mg Oral Daily  . aspirin EC  325 mg Oral Q breakfast  . azithromycin  500 mg Oral Daily  . cefTRIAXone (ROCEPHIN)  IV  1 g Intravenous Q24H  . latanoprost  1 drop Both Eyes QHS  . levothyroxine  75 mcg Oral QAC breakfast  . metoprolol tartrate  25 mg Oral BID  . pantoprazole  40 mg Oral QODAY  . QUEtiapine  25 mg Oral BID  . risperiDONE  0.25 mg Oral QPM  . sertraline  100 mg Oral Daily  . simvastatin  20 mg Oral q1800  . sodium chloride  3 mL Intravenous Q12H  Continuous Infusions: . sodium chloride 100 mL/hr at 01/29/14 1300  . lactated ringers 50 mL/hr at 01/27/14 1051    Past Medical History  Diagnosis Date  . Abdominal pain, epigastric   . Impacted cerumen   . Abnormality of gait   . Anal fissure   . Loss of weight   . Hyposmolality and/or hyponatremia   . Pain in limb   . Other vitamin B12 deficiency anemia   . Chest pain, unspecified   . Anemia, unspecified   . Anxiety state, unspecified   . Abdominal pain, right upper quadrant   . Other pulmonary embolism and infarction   . Intestinal or peritoneal adhesions with obstruction (postoperative) (postinfection)   . Long term (current) use of anticoagulants   . Abdominal pain, right lower quadrant   . Essential and other specified forms of tremor   . Lumbago   .  Unspecified hypothyroidism   . Major depressive disorder, single episode, unspecified   . Other and unspecified hyperlipidemia   . Macular degeneration (senile) of retina, unspecified   . Unspecified glaucoma   . Unspecified cataract   . Unspecified hearing loss   . Unspecified essential hypertension   . Osteoarthrosis, unspecified whether generalized or localized, unspecified site   . Osteoporosis, unspecified   . Abnormal involuntary movements(781.0)   . Emphysema of lung     Past Surgical History  Procedure Laterality Date  . Excisional hemorrhoidectomy  1980  . Bladder suspension  1993  . Back surgery  2003  . Abdominal hysterectomy    . Trigger finger release  2009    Dr Teressa Senter  . Intramedullary (im) nail intertrochanteric Right 01/27/2014    Procedure: INTRAMEDULLARY (IM) NAIL INTERTROCHANTRIC;  Surgeon: Sheral Apley, MD;  Location: [facility] OR;  Service: Orthopedics;  Laterality: Right;    Maureen Chatters, RD, LDN Pager #: [phone] After-Hours Pager #: [phone]

## 2014-01-29 NOTE — Progress Notes (Signed)
Pt unable to void since foley dc'd at 15:30 pm, no urge to void, bladder scan showed 125cc. Merdis Delay NP notified. No new orders made at this time. Will continue to monitor.

## 2014-01-29 NOTE — Telephone Encounter (Signed)
Jane [name], daughter, called and Left message on voicemail stating the patient fell in the bathroom on Monday and broke her right hip. She had surgery done on Tuesday and also has alittle pneumonia and is going to Rehab at [facility] on Friday.

## 2014-01-29 NOTE — Progress Notes (Signed)
Patient ID: Jane [name], female   DOB: 1925-08-14, 80 y.o.   MRN: [id number]  [facility] PROGRESS NOTE  Tiffanny Lamarche WUJ:[id number] DOB: May 26, 1925 DOA: 01/26/2014 PCP: Bufford Spikes, DO  Brief narrative: 80 y.o. female with a hx of osteoporosis, HTN, and depression presented to the ED following a mechanical fall at the facility and was apparently down for several hours before being found. The patient was brought to the ED where she complained of R hip pain. Plain film xrays demonstrated a nondisplaced R hip fx. Orthopedic surgery was consulted and the hospitalist service consulted for admission.   Hip fracture- IM nail 3/24, pt is currently comfortable and denies pain  - appreciate ortho input - aspirin and SCD's for DVT prophylaxis Expected ABLA- Hb 9.5>>9.1, stable. No overt bleeding, some dilutional component as well.  CAP vs aspiration PNA- given significant leukocytosis and low grade fever with CXR with possible PNA, will treat with Zithro and Rocephin - order sputum culture, urine legionella and strep pneumo - leukocytosis improving today, narrow to Levaquin on d/c. AKI - post op, ?dehydration due to poor po intake, fluid challenge today. Discontinue Lisinopril.  Depressive disorder, not elsewhere classified - appears to be at baseline Essential hypertension, benign- reasonable inpatient control. Monitor now off of Lisinopril.  Moderate malnutrition- secondary to progressive deconditioning  Consultants:  Ortho   Procedures/Studies: Dg Chest 1 View  01/26/2014  Underline emphysematous changes with superimposed right lower lobe process, likely pneumonia or aspiration.    Dg Hip Complete Right   01/26/2014  Nondisplaced intertrochanteric fracture of the right hip.     Antibiotics:  Zithromax 3/24 -->  Rocephin 3/24 -->  Code Status: Full Family Communication: d/w patient and daughter  Disposition Plan: Remains inpatient   HPI/Subjective: No events overnight.    Objective: Filed Vitals:   01/28/14 0422 01/28/14 1059 01/28/14 1402 01/28/14 2159  BP: 120/64 108/54 97/59 109/59  Pulse: 91 98 90 93  Temp: 97.9 F (36.6 C)  97.3 F (36.3 C) 98.6 F (37 C)  TempSrc: Oral  Oral Oral  Resp: 18  18 22   Height:      Weight:      SpO2: 91%  86% 92%    Intake/Output Summary (Last 24 hours) at 01/29/14 0741 Last data filed at 01/29/14 0445  Gross per 24 hour  Intake      0 ml  Output    825 ml  Net   -825 ml   Exam:  General:  Pt is alert, follows commands appropriately, not in acute distress  Cardiovascular: Regular rate and rhythm, S1/S2, no murmurs, no rubs, no gallops  Respiratory: Clear to auscultation bilaterally, no wheezing, bibasilar rhonchi   Abdomen: Soft, non tender, non distended, bowel sounds present, no guarding  Extremities: No edema, pulses DP and PT palpable bilaterally, tender at the right hip area   Neuro: Grossly nonfocal, alert and oriented to name only   Data Reviewed: Basic Metabolic Panel:  Recent Labs Lab 01/26/14 1630 01/27/14 2040 01/29/14 0319  NA 138 138 141  K 3.7 3.9 4.2  CL 97 103 106  CO2 21 19 21   GLUCOSE 139* 140* 125*  BUN 22 32* 44*  CREATININE 0.79 1.05 1.21*  CALCIUM 9.6 8.2* 8.7   CBC:  Recent Labs Lab 01/26/14 1630 01/28/14 0256 01/29/14 0319  WBC 23.2* 13.8* 13.1*  NEUTROABS 20.7*  --   --   HGB 12.9 9.5* 9.1*  HCT 37.1 28.2* 27.1*  MCV 88.1 91.6 91.9  PLT 374 270 254   Cardiac Enzymes:  Recent Labs Lab 01/26/14 1630 01/26/14 2038  CKTOTAL 400*  --   TROPONINI  --  <0.30   Scheduled Meds: . amLODipine  5 mg Oral Daily  . aspirin EC  325 mg Oral Q breakfast  . azithromycin  500 mg Oral Daily  . cefTRIAXone (ROCEPHIN)  IV  1 g Intravenous Q24H  . latanoprost  1 drop Both Eyes QHS  . levothyroxine  75 mcg Oral QAC breakfast  . lisinopril  40 mg Oral Daily  . metoprolol tartrate  25 mg Oral BID  . pantoprazole  40 mg Oral QODAY  . QUEtiapine  25 mg Oral BID   . risperiDONE  0.25 mg Oral QPM  . sertraline  100 mg Oral Daily  . simvastatin  20 mg Oral q1800  . sodium chloride  3 mL Intravenous Q12H   Continuous Infusions: . sodium chloride    . lactated ringers 50 mL/hr at 01/27/14 1051   Pamella Pert, MD  Pager [phone]  Time spent: 25 minutes  If 7PM-7AM, please contact night-coverage [email or web address] Password [facility] 01/29/2014, 7:41 AM   LOS: 3 days

## 2014-01-29 NOTE — Evaluation (Signed)
Clinical/Bedside Swallow Evaluation Patient Details  Name: Jane [name] MRN: [id number] Date of Birth: 12/18/1924  Today's Date: 01/29/2014 Time: 8676-7209 SLP Time Calculation (min): 12 min  Past Medical History:  Past Medical History  Diagnosis Date  . Abdominal pain, epigastric   . Impacted cerumen   . Abnormality of gait   . Anal fissure   . Loss of weight   . Hyposmolality and/or hyponatremia   . Pain in limb   . Other vitamin B12 deficiency anemia   . Chest pain, unspecified   . Anemia, unspecified   . Anxiety state, unspecified   . Abdominal pain, right upper quadrant   . Other pulmonary embolism and infarction   . Intestinal or peritoneal adhesions with obstruction (postoperative) (postinfection)   . Long term (current) use of anticoagulants   . Abdominal pain, right lower quadrant   . Essential and other specified forms of tremor   . Lumbago   . Unspecified hypothyroidism   . Major depressive disorder, single episode, unspecified   . Other and unspecified hyperlipidemia   . Macular degeneration (senile) of retina, unspecified   . Unspecified glaucoma   . Unspecified cataract   . Unspecified hearing loss   . Unspecified essential hypertension   . Osteoarthrosis, unspecified whether generalized or localized, unspecified site   . Osteoporosis, unspecified   . Abnormal involuntary movements(781.0)   . Emphysema of lung    Past Surgical History:  Past Surgical History  Procedure Laterality Date  . Excisional hemorrhoidectomy  1980  . Bladder suspension  1993  . Back surgery  2003  . Abdominal hysterectomy    . Trigger finger release  2009    Dr Teressa Senter  . Intramedullary (im) nail intertrochanteric Right 01/27/2014    Procedure: INTRAMEDULLARY (IM) NAIL INTERTROCHANTRIC;  Surgeon: Sheral Apley, MD;  Location: [facility] OR;  Service: Orthopedics;  Laterality: Right;   HPI:  81 y.o. female with a hx of osteoporosis, HTN, and depression presented to the ED following  a mechanical fall at the facility and was apparently down for several hours before being found. The patient was brought to the ED where she complained of R hip pain. Plain film xrays demonstrated a nondisplaced R hip fx. Orthopedic surgery was consulted and the hospitalist service consulted for admission.  Pt has had leukocytosis, low grade fever and possible pna, concern for aspiration.    Assessment / Plan / Recommendation Clinical Impression  Pt presents with multiple clinical indicators of aspiration pna and concern for dysphagia (RLL pna, leukocytosis, low grade temp, recent hip fx, reliance on pain meds, reluctance to sit fully upright d/t pain). Though she does not show any overt signs of aspiration at bedside, objective study warranted to evaluate pt for silent aspiration. Will attempt today if possible. Pt may continue diet at this time.     Aspiration Risk  Moderate    Diet Recommendation Regular;Thin liquid   Liquid Administration via: Cup;Straw Medication Administration: Whole meds with liquid Supervision: Patient able to self feed Compensations: Slow rate;Small sips/bites Postural Changes and/or Swallow Maneuvers: Seated upright 90 degrees    Other  Recommendations Recommended Consults: MBS Oral Care Recommendations: Oral care BID   Follow Up Recommendations   (TBD)    Frequency and Duration        Pertinent Vitals/Pain NA    SLP Swallow Goals     Swallow Study Prior Functional Status       General HPI: 80 y.o. female with a hx of osteoporosis,  HTN, and depression presented to the ED following a mechanical fall at the facility and was apparently down for several hours before being found. The patient was brought to the ED where she complained of R hip pain. Plain film xrays demonstrated a nondisplaced R hip fx. Orthopedic surgery was consulted and the hospitalist service consulted for admission.  Pt has had leukocytosis, low grade fever and possible pna, concern for  aspiration.  Type of Study: Bedside swallow evaluation Previous Swallow Assessment: none in chart Diet Prior to this Study: Regular;Thin liquids Temperature Spikes Noted: Yes Respiratory Status: Nasal cannula History of Recent Intubation: No Behavior/Cognition: Alert;Cooperative;Pleasant mood Oral Cavity - Dentition: Dentures, top;Dentures, bottom Self-Feeding Abilities: Able to feed self Patient Positioning: Upright in bed Baseline Vocal Quality: Clear Volitional Cough: Congested Volitional Swallow: Able to elicit    Oral/Motor/Sensory Function Overall Oral Motor/Sensory Function: Appears within functional limits for tasks assessed   Ice Chips     Thin Liquid Thin Liquid: Impaired Presentation: Cup;Straw;Self Fed Pharyngeal  Phase Impairments: Wet Vocal Quality (questionable)    Nectar Thick Nectar Thick Liquid: Not tested   Honey Thick Honey Thick Liquid: Not tested   Puree Puree: Not tested   Solid   GO    Solid: Not tested (no appetite)      Harlon Ditty, MA CCC-SLP [phone]  Jane [name], Riley Nearing 01/29/2014,10:15 AM

## 2014-01-29 NOTE — Telephone Encounter (Signed)
FYI to Dr.Green

## 2014-01-29 NOTE — Progress Notes (Signed)
Patient ID: Jane [name], female   DOB: 02/17/1925, 80 y.o.   MRN: [id number]     Subjective:  Patient reports pain as mild to moderate.  Patient sitting up in bed and in no acute distress.  Follows commands and is aware of time and place  Objective:   VITALS:   Filed Vitals:   01/28/14 1402 01/28/14 2159 01/29/14 0800 01/29/14 1113  BP: 97/59 109/59    Pulse: 90 93    Temp: 97.3 F (36.3 C) 98.6 F (37 C)    TempSrc: Oral Oral    Resp: 18 22 16 18   Height:      Weight:      SpO2: 86% 92% 95% 96%    ABD soft Sensation intact distally Dorsiflexion/Plantar flexion intact Incision: dressing C/D/I and no drainage   Lab Results  Component Value Date   WBC 13.1* 01/29/2014   HGB 9.1* 01/29/2014   HCT 27.1* 01/29/2014   MCV 91.9 01/29/2014   PLT 254 01/29/2014     Assessment/Plan: 2 Days Post-Op   Principal Problem:   Hip fracture Active Problems:   Senile osteoporosis   Depressive disorder, not elsewhere classified   Essential hypertension, benign   Tachycardia   Advance diet Up with therapy Continue plan per medicine WBAT Dry dressing PRN    01/31/2014, D 01/29/2014, 12:40 PM   01/31/2014 MD [phone]

## 2014-01-29 NOTE — Progress Notes (Signed)
Pt placed on bedpan and was able to void 250cc urine.

## 2014-01-30 LAB — CBC
HCT: 26.4 % — ABNORMAL LOW (ref 36.0–46.0)
Hemoglobin: 8.8 g/dL — ABNORMAL LOW (ref 12.0–15.0)
MCH: 30.3 pg (ref 26.0–34.0)
MCHC: 33.3 g/dL (ref 30.0–36.0)
MCV: 91 fL (ref 78.0–100.0)
PLATELETS: 263 10*3/uL (ref 150–400)
RBC: 2.9 MIL/uL — AB (ref 3.87–5.11)
RDW: 12.9 % (ref 11.5–15.5)
WBC: 11.3 10*3/uL — AB (ref 4.0–10.5)

## 2014-01-30 LAB — BASIC METABOLIC PANEL
BUN: 50 mg/dL — ABNORMAL HIGH (ref 6–23)
CHLORIDE: 109 meq/L (ref 96–112)
CO2: 22 meq/L (ref 19–32)
CREATININE: 1.03 mg/dL (ref 0.50–1.10)
Calcium: 8.8 mg/dL (ref 8.4–10.5)
GFR calc Af Amer: 55 mL/min — ABNORMAL LOW (ref >90)
GFR calc non Af Amer: 48 mL/min — ABNORMAL LOW (ref >90)
Glucose, Bld: 121 mg/dL — ABNORMAL HIGH (ref 70–99)
POTASSIUM: 4.1 meq/L (ref 3.7–5.3)
Sodium: 144 mEq/L (ref 137–147)

## 2014-01-30 MED ORDER — LEVOFLOXACIN 500 MG PO TABS: 500.0000 mg | ORAL_TABLET | Freq: Every day | ORAL | Status: DC

## 2014-01-30 NOTE — Progress Notes (Signed)
Physical Therapy Treatment Patient Details Name: Jane [name] MRN: [id number] DOB: April 21, 1925 Today's Date: 01/30/2014    History of Present Illness fall resulting in hip fx; pt is s/p IM nail     PT Comments    Pt anxious about moving, but progressed her slowly to standing after exercise.  Expect pt to make slow steady gains and need SNF  Follow Up Recommendations  SNF;Supervision/Assistance - 24 hour     Equipment Recommendations  Rolling walker with 5" wheels;3in1 (PT)    Recommendations for Other Services       Precautions / Restrictions Precautions Precautions: Fall Restrictions Weight Bearing Restrictions: Yes LLE Weight Bearing: Weight bearing as tolerated    Mobility  Bed Mobility Overal bed mobility: Needs Assistance Bed Mobility: Supine to Sit     Supine to sit: Mod assist;+2 for safety/equipment     General bed mobility comments: cued for guidance and sequencing, went slow to let her participate.  Significant truncal assist  Transfers Overall transfer level: Needs assistance Equipment used: Rolling walker (2 wheeled) Transfers: Sit to/from [employer] Sit to Stand: Mod assist;+2 physical assistance Stand pivot transfers: +2 physical assistance;Mod assist       General transfer comment:  face to face assist to chair  Ambulation/Gait                 Stairs            Wheelchair Mobility    Modified Rankin (Stroke Patients Only)       Balance Overall balance assessment: Needs assistance Sitting-balance support: Feet supported;No upper extremity supported;Single extremity supported Sitting balance-Leahy Scale: Poor Sitting balance - Comments: mostly leaning posteriorly today   Standing balance support: Bilateral upper extremity supported Standing balance-Leahy Scale: Poor Standing balance comment: able to bear weight and weight shift back and forth.  Did not pivot to chair with RW                     Cognition Arousal/Alertness: Awake/alert Behavior During Therapy: Anxious Overall Cognitive Status: Within Functional Limits for tasks assessed                      Exercises General Exercises - Lower Extremity Ankle Circles/Pumps: AROM;Both;20 reps Quad Sets: AROM;Both;10 reps;Supine Heel Slides: AROM;AAROM;Both;10 reps;Supine Hip ABduction/ADduction: AAROM;AROM;Both;10 reps;Supine Other Exercises Other Exercises: df and pf pressess bil x 10 reps    General Comments        Pertinent Vitals/Pain     Home Living                      Prior Function            PT Goals (current goals can now be found in the care plan section) Acute Rehab PT Goals Patient Stated Goal: to not be like this PT Goal Formulation: With patient Time For Goal Achievement: 02/11/14 Potential to Achieve Goals: Good Progress towards PT goals: Progressing toward goals    Frequency  Min 3X/week    PT Plan Current plan remains appropriate    End of Session Equipment Utilized During Treatment: Oxygen Activity Tolerance: Patient limited by pain Patient left: in chair;with call bell/phone within reach     Time: 2482-5003 PT Time Calculation (min): 25 min  Charges:  $Therapeutic Exercise: 8-22 mins $Therapeutic Activity: 8-22 mins                    G  Codes:      MottingerEliseo Gum 01/30/2014, 6:19 PM 01/30/2014  Miguel Barrera Bing, PT [phone]  (pager)

## 2014-01-30 NOTE — Progress Notes (Signed)
Speech Language Pathology Treatment: Dysphagia  Patient Details Name: Jane [name] MRN: [id number] DOB: 12/31/1924 Today's Date: 01/30/2014 Time: 1440-1456 SLP Time Calculation (min): 16 min  Assessment / Plan / Recommendation Clinical Impression  Daughter present at bedside, reviewed results of MBS and general expectation of return to thin liquids with improved strength and endurance in rehab. Instructed dtr in thickening liquids. Reinforced use of occasional throat clear with pt and family, though pt will need supervision to carry out. Tolerating nectar thick liquids well. Will sign off as education is complete, defer f/u to SNF SLP.    HPI HPI: 80 y.o. female with a hx of osteoporosis, HTN, and depression presented to the ED following a mechanical fall at the facility and was apparently down for several hours before being found. The patient was brought to the ED where she complained of R hip pain. Plain film xrays demonstrated a nondisplaced R hip fx. Orthopedic surgery was consulted and the hospitalist service consulted for admission.  Pt has had leukocytosis, low grade fever and possible pna, concern for aspiration.    Pertinent Vitals NA  SLP Plan  Other (Comment);All goals met (defer f/u to SNF SLP)    Recommendations Diet recommendations: Dysphagia 3 (mechanical soft);Thin liquid Liquids provided via: Cup Medication Administration: Whole meds with liquid Supervision: Patient able to self feed Compensations: Slow rate;Small sips/bites;Clear throat intermittently Postural Changes and/or Swallow Maneuvers: Seated upright 90 degrees              Oral Care Recommendations: Oral care BID Follow up Recommendations: Skilled Nursing facility Plan: Other (Comment);All goals met (defer f/u to SNF SLP)    GO     , MA CCC-SLP [phone]  ,  Caroline 01/30/2014, 3:06 PM    

## 2014-01-30 NOTE — Progress Notes (Signed)
CSW (Clinical Child psychotherapist) informed by RN CM that pt is not ready for dc today but could potentially be ready over weekend. CSW called facility and notified. They are able to accept pt over weekend if she is medically ready for dc. Weekend CSW will need to contact facility supervisor at [phone] and update if pt is going to be dc'd over weekend.  Jane [name], LCSWA [phone]

## 2014-01-30 NOTE — Progress Notes (Signed)
Patient ID: Jane [name], female   DOB: 01-26-1925, 80 y.o.   MRN: [id number]  [facility] PROGRESS NOTE  Dallana Mavity GBT:[id number] DOB: 1925-08-24 DOA: 01/26/2014 PCP: Bufford Spikes, DO  Brief narrative: 80 y.o. female with a hx of osteoporosis, HTN, and depression presented to the ED following a mechanical fall at the facility and was apparently down for several hours before being found. The patient was brought to the ED where she complained of R hip pain. Plain film xrays demonstrated a nondisplaced R hip fx. Orthopedic surgery was consulted and the hospitalist service consulted for admission.   Hip fracture- IM nail 3/24, pt is currently comfortable and denies pain  - appreciate ortho input - aspirin and SCD's for DVT prophylaxis Expected ABLA- Hb overall stable but trending down. Unusual elevated BUN, check FOBT.  CAP vs aspiration PNA- given significant leukocytosis and low grade fever with CXR with possible PNA, will treat with Zithro and Rocephin - order sputum culture, urine legionella and strep pneumo - leukocytosis improving, narrow to Levaquin on d/c. AKI - post op, ?dehydration due to poor po intake, fluid challenge today. Discontinue Lisinopril.  - renal function improved.  Depressive disorder, not elsewhere classified - appears to be at baseline Essential hypertension, benign- reasonable inpatient control. Monitor now off of Lisinopril.  Moderate malnutrition- secondary to progressive deconditioning  Consultants:  Ortho   Procedures/Studies: Dg Chest 1 View  01/26/2014  Underline emphysematous changes with superimposed right lower lobe process, likely pneumonia or aspiration.    Dg Hip Complete Right   01/26/2014  Nondisplaced intertrochanteric fracture of the right hip.     Antibiotics:  Zithromax 3/24 -->  Rocephin 3/24 -->  Code Status: Full Family Communication: d/w patient and daughter  Disposition Plan: Remains inpatient   HPI/Subjective: No  events overnight.   Objective: Filed Vitals:   01/29/14 1330 01/29/14 1600 01/29/14 2159 01/30/14 1031  BP: 106/80  98/47 116/57  Pulse: 81  94 88  Temp: 98.6 F (37 C)  99.2 F (37.3 C)   TempSrc: Oral  Oral   Resp: 18 16 16    Height:      Weight:      SpO2: 99% 98% 90% 94%    Intake/Output Summary (Last 24 hours) at 01/30/14 1321 Last data filed at 01/30/14 1253  Gross per 24 hour  Intake    183 ml  Output      0 ml  Net    183 ml   Exam:  General:  Pt is alert, follows commands appropriately, not in acute distress  Cardiovascular: Regular rate and rhythm, S1/S2, no murmurs, no rubs, no gallops  Respiratory: Clear to auscultation bilaterally, no wheezing, bibasilar rhonchi   Abdomen: Soft, non tender, non distended, bowel sounds present, no guarding  Extremities: No edema, pulses DP and PT palpable bilaterally, tender at the right hip area   Neuro: Grossly nonfocal, alert and oriented to name only   Data Reviewed: Basic Metabolic Panel:  Recent Labs Lab 01/26/14 1630 01/27/14 2040 01/29/14 0319 01/30/14 0330  NA 138 138 141 144  K 3.7 3.9 4.2 4.1  CL 97 103 106 109  CO2 21 19 21 22   GLUCOSE 139* 140* 125* 121*  BUN 22 32* 44* 50*  CREATININE 0.79 1.05 1.21* 1.03  CALCIUM 9.6 8.2* 8.7 8.8   CBC:  Recent Labs Lab 01/26/14 1630 01/28/14 0256 01/29/14 0319 01/30/14 0330  WBC 23.2* 13.8* 13.1* 11.3*  NEUTROABS 20.7*  --   --   --  HGB 12.9 9.5* 9.1* 8.8*  HCT 37.1 28.2* 27.1* 26.4*  MCV 88.1 91.6 91.9 91.0  PLT 374 270 254 263   Cardiac Enzymes:  Recent Labs Lab 01/26/14 1630 01/26/14 2038  CKTOTAL 400*  --   TROPONINI  --  <0.30   Scheduled Meds: . amLODipine  5 mg Oral Daily  . aspirin EC  325 mg Oral Q breakfast  . azithromycin  500 mg Oral Daily  . cefTRIAXone (ROCEPHIN)  IV  1 g Intravenous Q24H  . feeding supplement (ENSURE)  1 Container Oral TID BM  . latanoprost  1 drop Both Eyes QHS  . levothyroxine  75 mcg Oral QAC  breakfast  . metoprolol tartrate  25 mg Oral BID  . pantoprazole  40 mg Oral QODAY  . QUEtiapine  25 mg Oral BID  . risperiDONE  0.25 mg Oral QPM  . sertraline  100 mg Oral Daily  . simvastatin  20 mg Oral q1800  . sodium chloride  3 mL Intravenous Q12H   Continuous Infusions: . lactated ringers 50 mL/hr at 01/27/14 1051   Pamella Pert, MD  Pager [phone]  Time spent: 25 minutes  If 7PM-7AM, please contact night-coverage [email or web address] Password [email or web address] 01/30/2014, 1:21 PM   LOS: 4 days

## 2014-01-31 DIAGNOSIS — L89309 Pressure ulcer of unspecified buttock, unspecified stage: Secondary | ICD-10-CM | POA: Diagnosis not present

## 2014-01-31 DIAGNOSIS — Z5189 Encounter for other specified aftercare: Secondary | ICD-10-CM | POA: Diagnosis not present

## 2014-01-31 DIAGNOSIS — F4541 Pain disorder exclusively related to psychological factors: Secondary | ICD-10-CM | POA: Diagnosis not present

## 2014-01-31 DIAGNOSIS — J189 Pneumonia, unspecified organism: Secondary | ICD-10-CM

## 2014-01-31 DIAGNOSIS — R000 Tachycardia, unspecified: Secondary | ICD-10-CM | POA: Diagnosis not present

## 2014-01-31 DIAGNOSIS — S7290XD Unspecified fracture of unspecified femur, subsequent encounter for closed fracture with routine healing: Secondary | ICD-10-CM | POA: Diagnosis not present

## 2014-01-31 DIAGNOSIS — D62 Acute posthemorrhagic anemia: Secondary | ICD-10-CM

## 2014-01-31 DIAGNOSIS — F3289 Other specified depressive episodes: Secondary | ICD-10-CM | POA: Diagnosis not present

## 2014-01-31 DIAGNOSIS — B965 Pseudomonas (aeruginosa) (mallei) (pseudomallei) as the cause of diseases classified elsewhere: Secondary | ICD-10-CM | POA: Diagnosis not present

## 2014-01-31 DIAGNOSIS — S7000XA Contusion of unspecified hip, initial encounter: Secondary | ICD-10-CM | POA: Diagnosis not present

## 2014-01-31 DIAGNOSIS — N760 Acute vaginitis: Secondary | ICD-10-CM | POA: Diagnosis not present

## 2014-01-31 DIAGNOSIS — J309 Allergic rhinitis, unspecified: Secondary | ICD-10-CM | POA: Diagnosis not present

## 2014-01-31 DIAGNOSIS — Z9181 History of falling: Secondary | ICD-10-CM | POA: Diagnosis not present

## 2014-01-31 DIAGNOSIS — G479 Sleep disorder, unspecified: Secondary | ICD-10-CM | POA: Diagnosis not present

## 2014-01-31 DIAGNOSIS — I10 Essential (primary) hypertension: Secondary | ICD-10-CM | POA: Diagnosis not present

## 2014-01-31 DIAGNOSIS — L8990 Pressure ulcer of unspecified site, unspecified stage: Secondary | ICD-10-CM | POA: Diagnosis not present

## 2014-01-31 DIAGNOSIS — M810 Age-related osteoporosis without current pathological fracture: Secondary | ICD-10-CM | POA: Diagnosis not present

## 2014-01-31 DIAGNOSIS — E039 Hypothyroidism, unspecified: Secondary | ICD-10-CM | POA: Diagnosis not present

## 2014-01-31 DIAGNOSIS — M25559 Pain in unspecified hip: Secondary | ICD-10-CM | POA: Diagnosis not present

## 2014-01-31 DIAGNOSIS — S72009D Fracture of unspecified part of neck of unspecified femur, subsequent encounter for closed fracture with routine healing: Secondary | ICD-10-CM | POA: Diagnosis not present

## 2014-01-31 DIAGNOSIS — M79609 Pain in unspecified limb: Secondary | ICD-10-CM | POA: Diagnosis not present

## 2014-01-31 DIAGNOSIS — G8911 Acute pain due to trauma: Secondary | ICD-10-CM | POA: Diagnosis not present

## 2014-01-31 DIAGNOSIS — N390 Urinary tract infection, site not specified: Secondary | ICD-10-CM | POA: Diagnosis not present

## 2014-01-31 DIAGNOSIS — S72143A Displaced intertrochanteric fracture of unspecified femur, initial encounter for closed fracture: Secondary | ICD-10-CM | POA: Diagnosis not present

## 2014-01-31 DIAGNOSIS — F329 Major depressive disorder, single episode, unspecified: Secondary | ICD-10-CM | POA: Diagnosis not present

## 2014-01-31 DIAGNOSIS — S72009A Fracture of unspecified part of neck of unspecified femur, initial encounter for closed fracture: Secondary | ICD-10-CM | POA: Diagnosis not present

## 2014-01-31 HISTORY — DX: Acute posthemorrhagic anemia: D62

## 2014-01-31 HISTORY — DX: Pneumonia, unspecified organism: J18.9

## 2014-01-31 LAB — BASIC METABOLIC PANEL
BUN: 44 mg/dL — AB (ref 6–23)
CHLORIDE: 109 meq/L (ref 96–112)
CO2: 19 meq/L (ref 19–32)
CREATININE: 0.68 mg/dL (ref 0.50–1.10)
Calcium: 9.1 mg/dL (ref 8.4–10.5)
GFR calc non Af Amer: 77 mL/min — ABNORMAL LOW (ref >90)
GFR, EST AFRICAN AMERICAN: 89 mL/min — AB (ref >90)
Glucose, Bld: 120 mg/dL — ABNORMAL HIGH (ref 70–99)
POTASSIUM: 3.9 meq/L (ref 3.7–5.3)
Sodium: 145 mEq/L (ref 137–147)

## 2014-01-31 LAB — CBC
HEMATOCRIT: 28.6 % — AB (ref 36.0–46.0)
Hemoglobin: 9.5 g/dL — ABNORMAL LOW (ref 12.0–15.0)
MCH: 30.4 pg (ref 26.0–34.0)
MCHC: 33.2 g/dL (ref 30.0–36.0)
MCV: 91.7 fL (ref 78.0–100.0)
Platelets: 301 10*3/uL (ref 150–400)
RBC: 3.12 MIL/uL — ABNORMAL LOW (ref 3.87–5.11)
RDW: 13 % (ref 11.5–15.5)
WBC: 9.6 10*3/uL (ref 4.0–10.5)

## 2014-01-31 MED ORDER — HYDROCODONE-ACETAMINOPHEN 5-325 MG PO TABS
2.0000 | ORAL_TABLET | ORAL | Status: DC | PRN
Start: 2014-01-31 — End: 2014-04-13

## 2014-01-31 NOTE — Progress Notes (Signed)
Patient also refused her vital signs this morning.  Will continue to encourage patient to get checked.  Jane [name]

## 2014-01-31 NOTE — Progress Notes (Signed)
Patient refused blood draws from lab this morning.  Will try again later.  Jane [name]

## 2014-01-31 NOTE — Progress Notes (Signed)
PT refused labs for second time this am.  Park Breed, RN

## 2014-01-31 NOTE — Progress Notes (Signed)
Patient DC IV and tele per MD orders; DC to SNF; called SNF for report no answer; Patient transported via ambulance with family following.  Park Breed, RN

## 2014-01-31 NOTE — Discharge Summary (Signed)
Physician Discharge Summary  Lama Narayanan UJW:[id number] DOB: 1924-12-06 DOA: 01/26/2014  PCP: Bufford Spikes, DO  Admit date: 01/26/2014 Discharge date: 01/31/2014  Time spent: 35 minutes  Recommendations for Outpatient Follow-up:  1. Follow up with PCP in 1-2 weeks 2. Follow up with orthopedic surgery in 2 weeks  3. Continue aspirin 325 for 4 weeks after discharge 4. Contiue Levofloxacin for 3 additional days  Discharge Diagnoses:  Principal Problem:   Hip fracture Active Problems:   Senile osteoporosis   Depressive disorder, not elsewhere classified   Essential hypertension, benign   Anemia, iron deficiency   Hypothyroidism   Tachycardia   CAP (community acquired pneumonia)  Discharge Condition: stable, to SNF  Diet recommendation: regular  Filed Weights   01/26/14 2159  Weight: 53.5 kg (117 lb 15.1 oz)   History of present illness:  With a hx of osteoporosis, HTN, and depression who presents to the ED following a mechanical fall at the facility and was apparently down for several hours before being found. The patient was brought to the ED where she complained of R hip pain. Plain film xrays demonstrated a nondisplaced R hip fx. Orthopedic surgery was consulted and the hospitalist service consulted for admission. Of note, the patient was also noted to be tachycardic with HR into the 130-140's. She did receive adenosine with continued SVT afterwards.  Hospital Course:  Hip fracture - IM nail 3/24, pt is currently comfortable and denies pain. Appreciate ortho input, she is to continue Aspirin 325 for 4 weeks following discharge for DVT prophylaxis per orthopedic surgery.   Expected ABLA - hemoglobin down trending initially but recovering post op. No evidence of bleeding. Recommend monitoring CBC on Monday or Tuesday of next week.  CAP vs aspiration PNA- given significant leukocytosis and low grade fever with CXR with possible PNA, will treat with Zithro and Rocephin and  narrow to Levaquin on discharge, she is to complete a 7 day course with 3 additional days.  AKI - post op, ?dehydration due to poor po intake, responded well to fluid challenge and her renal function is back to normal on discharge. I held her Lisinopril because of her AKI. Her blood pressure has remained stable off of Lisinopril and would recommend monitoring and can restart Lisinopril if clinically indicated. Recommend monitoring BMP on Monday or Tuesday of next week.  Depressive disorder, not elsewhere classified - appears to be at baseline  Essential hypertension, benign- reasonable inpatient control. Hold Lisinopril for now, restart as clinically indicated.  Moderate malnutrition- secondary to progressive deconditioning  Procedures:  IM nail 3/24   Consultations:  Orthopedic surgery   Discharge Exam: Filed Vitals:   01/30/14 2000 01/30/14 2024 01/30/14 2353 01/31/14 0400  BP:  133/48    Pulse:  77    Temp:  97.9 F (36.6 C)    TempSrc:  Oral    Resp: 18  22 16   Height:      Weight:      SpO2:  95%     General: NAD Cardiovascular: RRR Respiratory: CTA biL  Discharge Instructions  Discharge Orders   Future Appointments Provider Department Dept Phone   02/23/2014 1:45 PM Carita Pian [facility] [phone]   03/09/2014 1:45 PM Santiago Bumpers, DPM [facility] [phone]   Future Orders Complete By Expires   Weight bearing as tolerated  As directed    Questions:     Laterality:     Extremity:  Medication List    STOP taking these medications       lisinopril 40 MG tablet  Commonly known as:  PRINIVIL,ZESTRIL      TAKE these medications       acetaminophen 650 MG CR tablet  Commonly known as:  TYLENOL  Take 650 mg by mouth every 8 (eight) hours as needed for pain.     albuterol 108 (90 BASE) MCG/ACT inhaler  Commonly known as:  PROVENTIL HFA;VENTOLIN HFA  Inhale 2 puffs into the lungs every 6 (six) hours as needed.  Shortness of breath.     amLODipine 5 MG tablet  Commonly known as:  NORVASC  Take 5 mg by mouth daily.     aspirin EC 325 MG tablet  Take 1 tablet (325 mg total) by mouth daily.     calcium carbonate 500 MG chewable tablet  Commonly known as:  TUMS - dosed in mg elemental calcium  Chew 1 tablet by mouth daily as needed for indigestion or heartburn.     calcium-vitamin D 500-200 MG-UNIT per tablet  Commonly known as:  OSCAL WITH D  Take 1 tablet by mouth daily.     DAYQUIL PO  Take 2 tablets by mouth every 4 (four) hours as needed (flu-like symptoms).     dicyclomine 10 MG capsule  Commonly known as:  BENTYL  Take 10 mg by mouth at bedtime.     docusate sodium 100 MG capsule  Commonly known as:  COLACE  Take 1 capsule (100 mg total) by mouth 2 (two) times daily. Continue this while taking narcotics to help with bowel movements     ferrous sulfate 325 (65 FE) MG tablet  Take 325 mg by mouth every other day.     fexofenadine 180 MG tablet  Commonly known as:  ALLEGRA  Take 180 mg by mouth daily as needed for allergies or rhinitis.     HYDROcodone-acetaminophen 5-325 MG per tablet  Commonly known as:  NORCO  Take 2 tablets by mouth every 4 (four) hours as needed.     hydrocortisone 2.5 % rectal cream  Commonly known as:  ANUSOL-HC  Place 1 application rectally daily as needed.     ICAPS LUTEIN & OMEGA-3 Caps  Take 1 capsule by mouth daily.     latanoprost 0.005 % ophthalmic solution  Commonly known as:  XALATAN  Place 1 drop into both eyes at bedtime.     levofloxacin 500 MG tablet  Commonly known as:  LEVAQUIN  Take 1 tablet (500 mg total) by mouth daily.     levothyroxine 75 MCG tablet  Commonly known as:  SYNTHROID, LEVOTHROID  Take 1 tablet (75 mcg total) by mouth daily before breakfast.     LORazepam 0.5 MG tablet  Commonly known as:  ATIVAN  Take 1 mg by mouth at bedtime as needed for anxiety.     lovastatin 20 MG tablet  Commonly known as:  MEVACOR    Take 20 mg by mouth every evening.     NYQUIL PO  Take 30 mLs by mouth at bedtime as needed (flu-like symptoms).     pantoprazole 40 MG tablet  Commonly known as:  PROTONIX  Take 40 mg by mouth every other day.     QUEtiapine 25 MG tablet  Commonly known as:  SEROQUEL  Take 25 mg by mouth 2 (two) times daily.     risperiDONE 0.25 MG tablet  Commonly known as:  RISPERDAL  Take 0.25 mg  by mouth every evening.     sertraline 100 MG tablet  Commonly known as:  ZOLOFT  Take 100 mg by mouth daily.     SINE-OFF PO  Take 1 tablet by mouth every 4 (four) hours as needed (cold and sinus symptoms).     vitamin B-12 100 MCG tablet  Commonly known as:  CYANOCOBALAMIN  Take 100 mcg by mouth daily.       Follow-up Information   Follow up with MURPHY, TIMOTHY, D, MD In 2 weeks.   Specialty:  Orthopedic Surgery   Contact information:   [address] [place] [address] [phone]       Follow up with REED, TIFFANY, DO. Schedule an appointment as soon as possible for a visit in 1 week.   Specialty:  Geriatric Medicine   Contact information:   [address] [place] [address] [phone]       The results of significant diagnostics from this hospitalization (including imaging, microbiology, ancillary and laboratory) are listed below for reference.    Significant Diagnostic Studies: Dg Chest 1 View  01/26/2014   CLINICAL DATA:  Larey Seat.  Right hip fracture.  EXAM: CHEST - 1 VIEW  COMPARISON:  06/09/2010.  FINDINGS: The heart is mildly enlarged but stable. There is tortuosity and calcification of the thoracic aorta. There are underline emphysematous changes. Right basilar airspace process is likely pneumonia or aspiration. No definite rib fractures or pneumothorax.  IMPRESSION: Underline emphysematous changes with superimposed right lower lobe process, likely pneumonia or aspiration.   Electronically Signed   By: Loralie Champagne M.D.   On: 01/26/2014 17:03   Dg  Hip Complete Right  01/26/2014   CLINICAL DATA:  Larey Seat.  Right hip pain.  EXAM: RIGHT HIP - COMPLETE 2+ VIEW  COMPARISON:  None.  FINDINGS: Nondisplaced intertrochanteric fracture of the right hip is noted. The hip is normally located. The pubic symphysis and SI joints are grossly normal.  IMPRESSION: Nondisplaced intertrochanteric fracture of the right hip.   Electronically Signed   By: Loralie Champagne M.D.   On: 01/26/2014 17:01   Dg Hip Operative Right  01/27/2014   CLINICAL DATA:  Right femoral intra medullary nail placement  EXAM: DG OPERATIVE RIGHT HIP  TECHNIQUE: A single spot fluoroscopic AP image of the right hip is submitted.  COMPARISON:  None.  FINDINGS: Four intraoperative fluoroscopic spot images. Interval placement of a right femoral intra medullary nail with a interlocking cannulated femoral neck screw transfixing an intertrochanteric fracture in near anatomic alignment. No dislocation. There is peripheral vascular atherosclerotic disease.  IMPRESSION: Interval placement of a right femoral intramedullary nail transfixing a intertrochanteric fracture in near anatomic alignment. .   Electronically Signed   By: Elige Ko   On: 01/27/2014 14:43   Dg Pelvis Portable  01/27/2014   CLINICAL DATA:  Postop from repair of right hip fracture.  EXAM: PORTABLE PELVIS 1-2 VIEWS  COMPARISON:  None.  FINDINGS: Right femoral nail and into medullary rod are seen transfixing an intertrochanteric right hip fracture in anatomic alignment. No evidence of hip dislocation. Mild postop gas noted in the right hip and buttock soft tissues. Peripheral vascular calcification also noted.  IMPRESSION: Internal fixation of right hip fracture in anatomic alignment.   Electronically Signed   By: Myles Rosenthal M.D.   On: 01/27/2014 14:03   Dg Swallowing Func-speech Pathology  01/29/2014   Riley Nearing Deblois, CCC-SLP     01/29/2014  3:38 PM Objective  Swallowing Evaluation: Modified Barium Swallowing Study   Patient  Details  Name: Cleopatra Cedarnna Powless MRN: [id number] Date of Birth: 03/29/1925  Today's Date: 01/29/2014 Time: 1410-1440 SLP Time Calculation (min): 30 min  Past Medical History:  Past Medical History  Diagnosis Date  . Abdominal pain, epigastric   . Impacted cerumen   . Abnormality of gait   . Anal fissure   . Loss of weight   . Hyposmolality and/or hyponatremia   . Pain in limb   . Other vitamin B12 deficiency anemia   . Chest pain, unspecified   . Anemia, unspecified   . Anxiety state, unspecified   . Abdominal pain, right upper quadrant   . Other pulmonary embolism and infarction   . Intestinal or peritoneal adhesions with obstruction  (postoperative) (postinfection)   . Long term (current) use of anticoagulants   . Abdominal pain, right lower quadrant   . Essential and other specified forms of tremor   . Lumbago   . Unspecified hypothyroidism   . Major depressive disorder, single episode, unspecified   . Other and unspecified hyperlipidemia   . Macular degeneration (senile) of retina, unspecified   . Unspecified glaucoma   . Unspecified cataract   . Unspecified hearing loss   . Unspecified essential hypertension   . Osteoarthrosis, unspecified whether generalized or localized,  unspecified site   . Osteoporosis, unspecified   . Abnormal involuntary movements(781.0)   . Emphysema of lung    Past Surgical History:  Past Surgical History  Procedure Laterality Date  . Excisional hemorrhoidectomy  1980  . Bladder suspension  1993  . Back surgery  2003  . Abdominal hysterectomy    . Trigger finger release  2009    Dr Teressa SenterSypher  . Intramedullary (im) nail intertrochanteric Right 01/27/2014    Procedure: INTRAMEDULLARY (IM) NAIL INTERTROCHANTRIC;  Surgeon:  Sheral Apleyimothy D Murphy, MD;  Location: [facility] OR;  Service: Orthopedics;   Laterality: Right;   HPI:  80 y.o. female with a hx of osteoporosis, HTN, and depression  presented to the ED following a mechanical fall at the facility  and was apparently down for several hours before being found.  The  patient was brought to the ED where she complained of R hip pain.  Plain film xrays demonstrated a nondisplaced R hip fx. Orthopedic  surgery was consulted and the hospitalist service consulted for  admission.  Pt has had leukocytosis, low grade fever and possible  pna, concern for aspiration.      Assessment / Plan / Recommendation Clinical Impression  Dysphagia Diagnosis: Moderate pharyngeal phase dysphagia;Mild  oral phase dysphagia Clinical impression: Pt presetns with a mild oral phase dysphagia  with slow but complete posterior propulsion of solid boluses. The  oroph[facility] phase [facility], but as study progressed pt  continually had decreased airway closure with trace penetration  of residuals and then mild to moderate silent aspiration during  the swallow with straw and cup sips of thin liquids. Postures  were ineffective and pts mentation also seemed to decline  slightly during the test as well, making complex strategies  impossible. Overall pt will beneift from short term nectar thick  liquids and mech soft solids to aid in airway protection during  recovery period following hip fx. Expect upgrade as strength and  functional reserve improve. Pt may need repeat MBS for upgrade    Treatment Recommendation  Therapy as outlined in treatment plan below    Diet Recommendation Dysphagia 3 (Mechanical Soft);Nectar-thick  liquid  Liquid Administration via: Cup;No straw Medication Administration: Whole meds with liquid Supervision: Patient able to self feed Compensations: Slow rate;Small sips/bites;Clear throat  intermittently Postural Changes and/or Swallow Maneuvers: Seated upright 90  degrees    Other  Recommendations Oral Care Recommendations: Oral care BID Other Recommendations: Order thickener from pharmacy   Follow Up Recommendations  Skilled Nursing facility    Frequency and Duration min 2x/week  2 weeks   Pertinent Vitals/Pain NA    SLP Swallow Goals     General HPI: 80 y.o. female with a hx  of osteoporosis, HTN, and  depression presented to the ED following a mechanical fall at the  facility and was apparently down for several hours before being  found. The patient was brought to the ED where she complained of  R hip pain. Plain film xrays demonstrated a nondisplaced R hip  fx. Orthopedic surgery was consulted and the hospitalist service  consulted for admission.  Pt has had leukocytosis, low grade  fever and possible pna, concern for aspiration.  Type of Study: Modified Barium Swallowing Study Reason for Referral: Objectively evaluate swallowing function Previous Swallow Assessment: none in chart Diet Prior to this Study: Regular;Thin liquids Temperature Spikes Noted: Yes Respiratory Status: Nasal cannula History of Recent Intubation: No Behavior/Cognition: Alert;Cooperative;Pleasant mood Oral Cavity - Dentition: Dentures, top;Dentures, bottom Oral Motor / Sensory Function: Within functional limits Self-Feeding Abilities: Able to feed self Patient Positioning: Partially reclined Baseline Vocal Quality: Clear Volitional Cough: Congested Volitional Swallow: Able to elicit Anatomy: Within functional limits    Reason for Referral Objectively evaluate swallowing function   Oral Phase Oral Preparation/Oral Phase Oral Phase: Impaired Oral - Nectar Oral - Nectar Cup: Lingual pumping;Reduced posterior  propulsion;Delayed oral transit Oral - Thin Oral - Thin Cup: Lingual pumping;Reduced posterior  propulsion;Delayed oral transit Oral - Thin Straw: Lingual pumping;Reduced posterior  propulsion;Delayed oral transit Oral - Solids Oral - Puree: Lingual pumping;Reduced posterior  propulsion;Delayed oral transit Oral - Mechanical Soft: Lingual pumping;Reduced posterior  propulsion;Delayed oral transit Oral - Pill: Lingual pumping;Reduced posterior propulsion;Delayed  oral transit   Pharyngeal Phase Pharyngeal Phase Pharyngeal Phase: Impaired Pharyngeal - Nectar Pharyngeal - Nectar Cup: Delayed swallow initiation  Pharyngeal - Thin Pharyngeal - Thin Cup: Delayed swallow initiation;Reduced  airway/laryngeal closure;Penetration/Aspiration during  swallow;Penetration/Aspiration after swallow;Moderate  aspiration;Pharyngeal residue - valleculae Penetration/Aspiration details (thin cup): Material enters  airway, passes BELOW cords without attempt by patient to eject  out (silent aspiration);Material does not enter airway;Material  enters airway, CONTACTS cords and not ejected out Pharyngeal - Thin Straw: Delayed swallow initiation;Reduced  airway/laryngeal closure;Penetration/Aspiration during  swallow;Penetration/Aspiration after swallow;Moderate  aspiration;Pharyngeal residue - valleculae Penetration/Aspiration details (thin straw): Material enters  airway, passes BELOW cords without attempt by patient to eject  out (silent aspiration);Material does not enter airway;Material  enters airway, CONTACTS cords and not ejected out Pharyngeal - Solids Pharyngeal - Puree: Within functional limits Pharyngeal - Mechanical Soft: Within functional limits  Cervical Esophageal Phase    GO             Harlon Ditty, MA CCC-SLP [phone]  Claudine Mouton 01/29/2014, 3:37 PM    Labs: Basic Metabolic Panel:  Recent Labs Lab 01/26/14 1630 01/27/14 2040 01/29/14 0319 01/30/14 0330 01/31/14 0945  NA 138 138 141 144 145  K 3.7 3.9 4.2 4.1 3.9  CL 97 103 106 109 109  CO2 21 19 21 22 19   GLUCOSE 139* 140* 125* 121* 120*  BUN 22 32* 44* 50* 44*  CREATININE 0.79  1.05 1.21* 1.03 0.68  CALCIUM 9.6 8.2* 8.7 8.8 9.1   CBC:  Recent Labs Lab 01/26/14 1630 01/28/14 0256 01/29/14 0319 01/30/14 0330 01/31/14 0945  WBC 23.2* 13.8* 13.1* 11.3* 9.6  NEUTROABS 20.7*  --   --   --   --   HGB 12.9 9.5* 9.1* 8.8* 9.5*  HCT 37.1 28.2* 27.1* 26.4* 28.6*  MCV 88.1 91.6 91.9 91.0 91.7  PLT 374 270 254 263 301   Cardiac Enzymes:  Recent Labs Lab 01/26/14 1630 01/26/14 2038  CKTOTAL 400*  --   TROPONINI  --  <0.30      Signed:  Jetson Pickrel  [facility] 01/31/2014, 11:44 AM

## 2014-01-31 NOTE — Progress Notes (Signed)
Physical Therapy Treatment Patient Details Name: Gwendolynn Merkey MRN: [id number] DOB: November 24, 1924 Today's Date: 01/31/2014    History of Present Illness fall resulting in hip fx; pt is s/p IM nail     PT Comments    Expect pt to progress slowly.  She does not appear to have the initiative to move on her own and does not give a lot of effort doing exercise in therapy.   Follow Up Recommendations  SNF;Supervision/Assistance - 24 hour     Equipment Recommendations   (TBA at next venue)    Recommendations for Other Services       Precautions / Restrictions Restrictions LLE Weight Bearing: Weight bearing as tolerated    Mobility  Bed Mobility                  Transfers                    Ambulation/Gait                 Stairs            Wheelchair Mobility    Modified Rankin (Stroke Patients Only)       Balance                                    Cognition Arousal/Alertness: Awake/alert Behavior During Therapy: Anxious Overall Cognitive Status: Difficult to assess                      Exercises General Exercises - Lower Extremity Ankle Circles/Pumps: AROM;Both;20 reps Quad Sets: AROM;Both;10 reps;Supine Heel Slides: AROM;AAROM;Both;10 reps;Supine Hip ABduction/ADduction: AAROM;AROM;Both;10 reps;Supine Other Exercises Other Exercises: df and pf pressess bil x 10 reps Other Exercises: bicep/tricep presses x10 bilateral    General Comments        Pertinent Vitals/Pain     Home Living                      Prior Function            PT Goals (current goals can now be found in the care plan section) Acute Rehab PT Goals PT Goal Formulation: With patient Time For Goal Achievement: 02/11/14 Potential to Achieve Goals: Good Progress towards PT goals: Progressing toward goals    Frequency  Min 3X/week    PT Plan Current plan remains appropriate    End of Session   Activity Tolerance:  Patient limited by pain Patient left: in bed;with call bell/phone within reach     Time: 1435-1455 PT Time Calculation (min): 20 min  Charges:  $Therapeutic Exercise: 8-22 mins                    G Codes:      Hailea Eaglin, Eliseo Gum 01/31/2014, 2:46 PM 01/31/2014  [place] Bing, PT [phone]  (pager)

## 2014-01-31 NOTE — Progress Notes (Signed)
Called report to SNF, no answer.  Park Breed, RN

## 2014-01-31 NOTE — Clinical Social Work Placement (Signed)
Per MD patient ready to DC to [facility]. RN, patient, patient's daughter, and facility notified of DC. CSW has [facility]. RN given number for report. CSW has completed and placed DC packet on patient's shadow chart. Ambulance transport requested for patient. CSW signing off.  My Madariaga Patrick-Jefferson, LCSWA Weekend Clinical Social Worker [phone]

## 2014-02-01 DIAGNOSIS — J309 Allergic rhinitis, unspecified: Secondary | ICD-10-CM | POA: Diagnosis not present

## 2014-02-01 DIAGNOSIS — F3289 Other specified depressive episodes: Secondary | ICD-10-CM | POA: Diagnosis not present

## 2014-02-01 DIAGNOSIS — I10 Essential (primary) hypertension: Secondary | ICD-10-CM | POA: Diagnosis not present

## 2014-02-01 DIAGNOSIS — S72009A Fracture of unspecified part of neck of unspecified femur, initial encounter for closed fracture: Secondary | ICD-10-CM | POA: Diagnosis not present

## 2014-02-01 DIAGNOSIS — F329 Major depressive disorder, single episode, unspecified: Secondary | ICD-10-CM | POA: Diagnosis not present

## 2014-02-01 DIAGNOSIS — E039 Hypothyroidism, unspecified: Secondary | ICD-10-CM | POA: Diagnosis not present

## 2014-02-13 DIAGNOSIS — S7290XD Unspecified fracture of unspecified femur, subsequent encounter for closed fracture with routine healing: Secondary | ICD-10-CM | POA: Diagnosis not present

## 2014-02-16 DIAGNOSIS — N390 Urinary tract infection, site not specified: Secondary | ICD-10-CM | POA: Diagnosis not present

## 2014-02-16 DIAGNOSIS — B965 Pseudomonas (aeruginosa) (mallei) (pseudomallei) as the cause of diseases classified elsewhere: Secondary | ICD-10-CM | POA: Diagnosis not present

## 2014-02-16 DIAGNOSIS — G479 Sleep disorder, unspecified: Secondary | ICD-10-CM | POA: Diagnosis not present

## 2014-02-23 ENCOUNTER — Ambulatory Visit: Payer: Medicare Other | Admitting: Internal Medicine

## 2014-02-27 ENCOUNTER — Telehealth: Payer: Self-pay | Admitting: Internal Medicine

## 2014-02-27 NOTE — Telephone Encounter (Signed)
Called pt to scheduled Pharmacogenetice Testing Review, no ans.  Left a message... cyd

## 2014-03-05 ENCOUNTER — Ambulatory Visit: Payer: Medicare Other | Admitting: Internal Medicine

## 2014-03-09 ENCOUNTER — Ambulatory Visit: Payer: Medicare Other | Admitting: Podiatry

## 2014-03-12 ENCOUNTER — Encounter: Payer: Self-pay | Admitting: Internal Medicine

## 2014-03-12 DIAGNOSIS — M79609 Pain in unspecified limb: Secondary | ICD-10-CM | POA: Diagnosis not present

## 2014-03-16 DIAGNOSIS — S7290XD Unspecified fracture of unspecified femur, subsequent encounter for closed fracture with routine healing: Secondary | ICD-10-CM | POA: Diagnosis not present

## 2014-03-17 DIAGNOSIS — N760 Acute vaginitis: Secondary | ICD-10-CM | POA: Diagnosis not present

## 2014-03-20 DIAGNOSIS — N760 Acute vaginitis: Secondary | ICD-10-CM | POA: Diagnosis not present

## 2014-03-24 ENCOUNTER — Encounter: Payer: Self-pay | Admitting: Internal Medicine

## 2014-03-26 DIAGNOSIS — L8990 Pressure ulcer of unspecified site, unspecified stage: Secondary | ICD-10-CM | POA: Diagnosis not present

## 2014-03-26 DIAGNOSIS — L89309 Pressure ulcer of unspecified buttock, unspecified stage: Secondary | ICD-10-CM | POA: Diagnosis not present

## 2014-03-27 DIAGNOSIS — S7000XA Contusion of unspecified hip, initial encounter: Secondary | ICD-10-CM | POA: Diagnosis not present

## 2014-03-30 DIAGNOSIS — N760 Acute vaginitis: Secondary | ICD-10-CM | POA: Diagnosis not present

## 2014-04-01 DIAGNOSIS — F329 Major depressive disorder, single episode, unspecified: Secondary | ICD-10-CM

## 2014-04-01 DIAGNOSIS — M810 Age-related osteoporosis without current pathological fracture: Secondary | ICD-10-CM

## 2014-04-01 DIAGNOSIS — J438 Other emphysema: Secondary | ICD-10-CM | POA: Diagnosis not present

## 2014-04-01 DIAGNOSIS — S72009D Fracture of unspecified part of neck of unspecified femur, subsequent encounter for closed fracture with routine healing: Secondary | ICD-10-CM

## 2014-04-01 DIAGNOSIS — R262 Difficulty in walking, not elsewhere classified: Secondary | ICD-10-CM | POA: Diagnosis not present

## 2014-04-01 DIAGNOSIS — M069 Rheumatoid arthritis, unspecified: Secondary | ICD-10-CM

## 2014-04-01 DIAGNOSIS — F3289 Other specified depressive episodes: Secondary | ICD-10-CM

## 2014-04-02 DIAGNOSIS — M810 Age-related osteoporosis without current pathological fracture: Secondary | ICD-10-CM | POA: Diagnosis not present

## 2014-04-02 DIAGNOSIS — J438 Other emphysema: Secondary | ICD-10-CM | POA: Diagnosis not present

## 2014-04-02 DIAGNOSIS — F329 Major depressive disorder, single episode, unspecified: Secondary | ICD-10-CM | POA: Diagnosis not present

## 2014-04-02 DIAGNOSIS — F3289 Other specified depressive episodes: Secondary | ICD-10-CM | POA: Diagnosis not present

## 2014-04-02 DIAGNOSIS — S72009D Fracture of unspecified part of neck of unspecified femur, subsequent encounter for closed fracture with routine healing: Secondary | ICD-10-CM | POA: Diagnosis not present

## 2014-04-02 DIAGNOSIS — R262 Difficulty in walking, not elsewhere classified: Secondary | ICD-10-CM | POA: Diagnosis not present

## 2014-04-02 DIAGNOSIS — M069 Rheumatoid arthritis, unspecified: Secondary | ICD-10-CM | POA: Diagnosis not present

## 2014-04-06 DIAGNOSIS — R262 Difficulty in walking, not elsewhere classified: Secondary | ICD-10-CM | POA: Diagnosis not present

## 2014-04-06 DIAGNOSIS — J438 Other emphysema: Secondary | ICD-10-CM | POA: Diagnosis not present

## 2014-04-06 DIAGNOSIS — S72009D Fracture of unspecified part of neck of unspecified femur, subsequent encounter for closed fracture with routine healing: Secondary | ICD-10-CM | POA: Diagnosis not present

## 2014-04-06 DIAGNOSIS — F3289 Other specified depressive episodes: Secondary | ICD-10-CM | POA: Diagnosis not present

## 2014-04-06 DIAGNOSIS — M810 Age-related osteoporosis without current pathological fracture: Secondary | ICD-10-CM | POA: Diagnosis not present

## 2014-04-06 DIAGNOSIS — M069 Rheumatoid arthritis, unspecified: Secondary | ICD-10-CM | POA: Diagnosis not present

## 2014-04-06 DIAGNOSIS — F329 Major depressive disorder, single episode, unspecified: Secondary | ICD-10-CM | POA: Diagnosis not present

## 2014-04-09 DIAGNOSIS — S72009D Fracture of unspecified part of neck of unspecified femur, subsequent encounter for closed fracture with routine healing: Secondary | ICD-10-CM | POA: Diagnosis not present

## 2014-04-09 DIAGNOSIS — M069 Rheumatoid arthritis, unspecified: Secondary | ICD-10-CM | POA: Diagnosis not present

## 2014-04-09 DIAGNOSIS — R262 Difficulty in walking, not elsewhere classified: Secondary | ICD-10-CM | POA: Diagnosis not present

## 2014-04-09 DIAGNOSIS — F329 Major depressive disorder, single episode, unspecified: Secondary | ICD-10-CM | POA: Diagnosis not present

## 2014-04-09 DIAGNOSIS — M810 Age-related osteoporosis without current pathological fracture: Secondary | ICD-10-CM | POA: Diagnosis not present

## 2014-04-09 DIAGNOSIS — F3289 Other specified depressive episodes: Secondary | ICD-10-CM | POA: Diagnosis not present

## 2014-04-09 DIAGNOSIS — J438 Other emphysema: Secondary | ICD-10-CM | POA: Diagnosis not present

## 2014-04-13 ENCOUNTER — Encounter: Payer: Self-pay | Admitting: Internal Medicine

## 2014-04-13 ENCOUNTER — Ambulatory Visit (INDEPENDENT_AMBULATORY_CARE_PROVIDER_SITE_OTHER): Payer: Medicare Other | Admitting: Internal Medicine

## 2014-04-13 VITALS — BP 130/78 | HR 67 | Temp 98.1°F | Wt 108.0 lb

## 2014-04-13 DIAGNOSIS — J438 Other emphysema: Secondary | ICD-10-CM | POA: Diagnosis not present

## 2014-04-13 DIAGNOSIS — F329 Major depressive disorder, single episode, unspecified: Secondary | ICD-10-CM | POA: Diagnosis not present

## 2014-04-13 DIAGNOSIS — S01312A Laceration without foreign body of left ear, initial encounter: Secondary | ICD-10-CM

## 2014-04-13 DIAGNOSIS — L89159 Pressure ulcer of sacral region, unspecified stage: Secondary | ICD-10-CM

## 2014-04-13 DIAGNOSIS — K5909 Other constipation: Secondary | ICD-10-CM

## 2014-04-13 DIAGNOSIS — S72009A Fracture of unspecified part of neck of unspecified femur, initial encounter for closed fracture: Secondary | ICD-10-CM | POA: Diagnosis not present

## 2014-04-13 DIAGNOSIS — F3289 Other specified depressive episodes: Secondary | ICD-10-CM

## 2014-04-13 DIAGNOSIS — L8990 Pressure ulcer of unspecified site, unspecified stage: Secondary | ICD-10-CM

## 2014-04-13 DIAGNOSIS — B3731 Acute candidiasis of vulva and vagina: Secondary | ICD-10-CM | POA: Diagnosis not present

## 2014-04-13 DIAGNOSIS — J449 Chronic obstructive pulmonary disease, unspecified: Secondary | ICD-10-CM

## 2014-04-13 DIAGNOSIS — R262 Difficulty in walking, not elsewhere classified: Secondary | ICD-10-CM | POA: Diagnosis not present

## 2014-04-13 DIAGNOSIS — L89109 Pressure ulcer of unspecified part of back, unspecified stage: Secondary | ICD-10-CM

## 2014-04-13 DIAGNOSIS — M069 Rheumatoid arthritis, unspecified: Secondary | ICD-10-CM | POA: Diagnosis not present

## 2014-04-13 DIAGNOSIS — B373 Candidiasis of vulva and vagina: Secondary | ICD-10-CM

## 2014-04-13 DIAGNOSIS — E039 Hypothyroidism, unspecified: Secondary | ICD-10-CM | POA: Diagnosis not present

## 2014-04-13 DIAGNOSIS — J4489 Other specified chronic obstructive pulmonary disease: Secondary | ICD-10-CM

## 2014-04-13 DIAGNOSIS — M810 Age-related osteoporosis without current pathological fracture: Secondary | ICD-10-CM | POA: Diagnosis not present

## 2014-04-13 DIAGNOSIS — K5900 Constipation, unspecified: Secondary | ICD-10-CM

## 2014-04-13 DIAGNOSIS — S72009D Fracture of unspecified part of neck of unspecified femur, subsequent encounter for closed fracture with routine healing: Secondary | ICD-10-CM | POA: Diagnosis not present

## 2014-04-13 DIAGNOSIS — S72001A Fracture of unspecified part of neck of right femur, initial encounter for closed fracture: Secondary | ICD-10-CM

## 2014-04-13 DIAGNOSIS — F29 Unspecified psychosis not due to a substance or known physiological condition: Secondary | ICD-10-CM

## 2014-04-13 HISTORY — DX: Laceration without foreign body of left ear, initial encounter: S01.312A

## 2014-04-13 MED ORDER — LORAZEPAM 0.5 MG PO TABS: ORAL_TABLET | ORAL | Status: DC

## 2014-04-13 MED ORDER — AMLODIPINE BESYLATE 5 MG PO TABS: 5.0000 mg | ORAL_TABLET | Freq: Every day | ORAL | Status: DC

## 2014-04-13 MED ORDER — DOCUSATE SODIUM 100 MG PO CAPS: 100.0000 mg | ORAL_CAPSULE | Freq: Two times a day (BID) | ORAL | Status: DC

## 2014-04-13 MED ORDER — ALBUTEROL SULFATE HFA 108 (90 BASE) MCG/ACT IN AERS: 2.0000 | INHALATION_SPRAY | Freq: Four times a day (QID) | RESPIRATORY_TRACT | Status: DC | PRN

## 2014-04-13 MED ORDER — LISINOPRIL 40 MG PO TABS
40.0000 mg | ORAL_TABLET | Freq: Every day | ORAL | Status: DC
Start: 2014-04-13 — End: 2014-06-29

## 2014-04-13 MED ORDER — FLUCONAZOLE 150 MG PO TABS: 150.0000 mg | ORAL_TABLET | Freq: Once | ORAL | Status: DC

## 2014-04-13 MED ORDER — LOVASTATIN 20 MG PO TABS: 20.0000 mg | ORAL_TABLET | Freq: Every evening | ORAL | Status: DC

## 2014-04-13 MED ORDER — QUETIAPINE FUMARATE 25 MG PO TABS: 25.0000 mg | ORAL_TABLET | Freq: Every day | ORAL | Status: DC

## 2014-04-13 MED ORDER — LEVOTHYROXINE SODIUM 75 MCG PO TABS: 75.0000 ug | ORAL_TABLET | Freq: Every day | ORAL | Status: DC

## 2014-04-13 NOTE — Progress Notes (Signed)
Patient ID: Jane [name], female   DOB: Jul 08, 1925, 80 y.o.   MRN: [id number]   Location:  [facility]  Code Status: DNR   Allergies  Allergen Reactions  . Celebrex [Celecoxib]     unknown  . Chlorzoxazone     unknown    Chief Complaint  Patient presents with  . Follow-up    F/U from Rehab, patient feel and broke right hip  . Vaginitis    Vaginal Yeast Infection   . Ear Problem    Sore in left ear x 6 weeks- 2 months, unable to wear hearing aids  . Mass    Patient with lump on right buttock x 1 month or longer  . Bed sores    Bed sores open back up     HPI: Patient is a 80 y.o. white female seen in the office today for follow up after a hospitalization and rehab stay due to right hip fracture.    01/26/14 Had a very bad cold at the time and had seen Dr. Chilton Si.  She was using dayquil and nyquil.  Her daughter and SIL also had it.  Got up to use bathroom.  After going, tripped and fell--may have been on throw rug and she also had not been eating well.  Either couldn't get to the help string.  Neighbor heard her about 2 hrs later.  Had right hip fx.    At [facility], found to have pneumonia on right side--IV abx started.  Dr. Eulah Pont did rod and bolt. Then at [facility] until the 3/28 when transferred to [facility].  Was there for 2 mos.   Has a vaginal itch for about 6 wks.  Was getting premarin vaginal cream w/o benefit.    Bedsores on left side started in the hospital.  Rehab had it healed, but opened back up the week she was to be discharged.  [facility]--Lisa and Weston Elliana nurse  and Jae Dire is therapist.     Has a sore in her left ear that started 6-8 wks ago--is secreting something that gets on the hearing aide.  Only wearing her right one today.  Has another bump on the right buttock also.    Added zoloft 100mg  in the morning.  Takes one seroquel at suppertime.  Risperdal was stopped.  Also has that she was getting at  night while in rehab.  Not using the ambien.    Did not eat well in therapy--was encouraged to have boost and chicken salad.  Had fluid retention in middle of therapy, too.  Lost 10 lbs since 3/23.    Had been on oxygen temporarily during hospitalization and rehab, but was eventually tapered off.   Review of Systems:  Review of Systems  Constitutional: Positive for weight loss. Negative for fever.  HENT: Positive for hearing loss.        Sore in left ear  Eyes: Negative for blurred vision.  Respiratory: Negative for cough and shortness of breath.   Cardiovascular: Negative for chest pain and leg swelling.  Gastrointestinal: Positive for constipation, blood in stool and melena. Negative for heartburn and abdominal pain.  Genitourinary: Negative for dysuria.  Musculoskeletal: Positive for falls.       No additional falls since fracture  Skin: Negative for rash.       Sacral decubitus ulcer of left buttock, "bump on right butt"  Neurological: Positive for weakness. Negative for dizziness, loss of consciousness and  headaches.  Psychiatric/Behavioral: Positive for depression and memory loss.     Past Medical History  Diagnosis Date  . Abdominal pain, epigastric   . Impacted cerumen   . Abnormality of gait   . Anal fissure   . Loss of weight   . Hyposmolality and/or hyponatremia   . Pain in limb   . Other vitamin B12 deficiency anemia   . Chest pain, unspecified   . Anemia, unspecified   . Anxiety state, unspecified   . Abdominal pain, right upper quadrant   . Other pulmonary embolism and infarction   . Intestinal or peritoneal adhesions with obstruction (postoperative) (postinfection)   . Long term (current) use of anticoagulants   . Abdominal pain, right lower quadrant   . Essential and other specified forms of tremor   . Lumbago   . Unspecified hypothyroidism   . Major depressive disorder, single episode, unspecified   . Other and unspecified hyperlipidemia   . Macular  degeneration (senile) of retina, unspecified   . Unspecified glaucoma   . Unspecified cataract   . Unspecified hearing loss   . Unspecified essential hypertension   . Osteoarthrosis, unspecified whether generalized or localized, unspecified site   . Osteoporosis, unspecified   . Abnormal involuntary movements(781.0)   . Emphysema of lung     Past Surgical History  Procedure Laterality Date  . Excisional hemorrhoidectomy  1980  . Bladder suspension  1993  . Back surgery  2003  . Abdominal hysterectomy    . Trigger finger release  2009    Dr Teressa Senter  . Intramedullary (im) nail intertrochanteric Right 01/27/2014    Procedure: INTRAMEDULLARY (IM) NAIL INTERTROCHANTRIC;  Surgeon: Sheral Apley, MD;  Location: [facility] OR;  Service: Orthopedics;  Laterality: Right;    Social History:   reports that she has quit smoking. Her smoking use included Cigarettes. She smoked 0.00 packs per day for 40 years. She does not have any smokeless tobacco history on file. She reports that she does not drink alcohol or use illicit drugs.  Family History  Problem Relation Age of Onset  . Congestive Heart Failure Mother   . Macular degeneration Mother   . Heart disease Mother   . Cancer Sister     lung cancer  . Arthritis Daughter   . Heart disease Daughter   . Glaucoma Sister   . Cataracts Sister     Medications: Patient's Medications  New Prescriptions   No medications on file  Previous Medications   ACETAMINOPHEN (TYLENOL) 650 MG CR TABLET    Take 650 mg by mouth every 8 (eight) hours as needed for pain.   ALBUTEROL (PROVENTIL HFA;VENTOLIN HFA) 108 (90 BASE) MCG/ACT INHALER    Inhale 2 puffs into the lungs every 6 (six) hours as needed. Shortness of breath.   AMLODIPINE (NORVASC) 5 MG TABLET    Take 5 mg by mouth daily.   CALCIUM CARBONATE (TUMS - DOSED IN MG ELEMENTAL CALCIUM) 500 MG CHEWABLE TABLET    Chew 1 tablet by mouth daily as needed for indigestion or heartburn.   CALCIUM-VITAMIN D  (OSCAL WITH D) 500-200 MG-UNIT PER TABLET    Take 1 tablet by mouth daily.   CHLORPHEN-PSEUDOEPHED-APAP (SINE-OFF PO)    Take 1 tablet by mouth every 4 (four) hours as needed (cold and sinus symptoms).   DICYCLOMINE (BENTYL) 10 MG CAPSULE    Take 10 mg by mouth at bedtime.   DOCUSATE SODIUM (COLACE) 100 MG CAPSULE    Take 1  capsule (100 mg total) by mouth 2 (two) times daily. Continue this while taking narcotics to help with bowel movements   FERROUS SULFATE 325 (65 FE) MG TABLET    Take 325 mg by mouth every other day.   FEXOFENADINE (ALLEGRA) 180 MG TABLET    Take 180 mg by mouth daily as needed for allergies or rhinitis.   HYDROCORTISONE (ANUSOL-HC) 2.5 % RECTAL CREAM    Place 1 application rectally daily as needed.   LATANOPROST (XALATAN) 0.005 % OPHTHALMIC SOLUTION    Place 1 drop into both eyes at bedtime.   LEVOTHYROXINE (SYNTHROID, LEVOTHROID) 75 MCG TABLET    Take 1 tablet (75 mcg total) by mouth daily before breakfast.   LISINOPRIL (PRINIVIL,ZESTRIL) 40 MG TABLET    Take 40 mg by mouth daily.   LORAZEPAM (ATIVAN) 0.5 MG TABLET    Take 1 mg by mouth at bedtime as needed for anxiety.   LOVASTATIN (MEVACOR) 20 MG TABLET    Take 20 mg by mouth every evening.   MULTIPLE VITAMINS-MINERALS (ICAPS LUTEIN & OMEGA-3) CAPS    Take 1 capsule by mouth daily.   QUETIAPINE (SEROQUEL) 25 MG TABLET    Take 25 mg by mouth at bedtime.    SERTRALINE (ZOLOFT) 100 MG TABLET    Take 100 mg by mouth daily.   VITAMIN B-12 (CYANOCOBALAMIN) 100 MCG TABLET    Take 100 mcg by mouth daily.   ZOLPIDEM (AMBIEN) 5 MG TABLET    Take 5 mg by mouth at bedtime as needed for sleep.  Modified Medications   No medications on file  Discontinued Medications   ASPIRIN EC 325 MG TABLET    Take 1 tablet (325 mg total) by mouth daily.   HYDROCODONE-ACETAMINOPHEN (NORCO) 5-325 MG PER TABLET    Take 2 tablets by mouth every 4 (four) hours as needed.   LEVOFLOXACIN (LEVAQUIN) 500 MG TABLET    Take 1 tablet (500 mg total) by mouth  daily.   PANTOPRAZOLE (PROTONIX) 40 MG TABLET    Take 40 mg by mouth every other day.   PSEUDOEPH-DOXYLAMINE-DM-APAP (NYQUIL PO)    Take 30 mLs by mouth at bedtime as needed (flu-like symptoms).   PSEUDOEPHEDRINE-APAP-DM (DAYQUIL PO)    Take 2 tablets by mouth every 4 (four) hours as needed (flu-like symptoms).   RISPERIDONE (RISPERDAL) 0.25 MG TABLET    Take 0.25 mg by mouth every evening.     Physical Exam: Filed Vitals:   04/13/14 1548  BP: 130/78  Pulse: 67  Temp: 98.1 F (36.7 C)  TempSrc: Oral  Weight: 108 lb (48.988 kg)  SpO2: 91%  Physical Exam  Constitutional: No distress.  Frail white female ambulating with walker  HENT:  Head: Normocephalic and atraumatic.  Dentures loose;  Left ear canal with small excoriation, no visible drainage, small volume of cerumen present  Cardiovascular: Normal rate, regular rhythm, normal heart sounds and intact distal pulses.   Pulmonary/Chest: Effort normal and breath sounds normal. No respiratory distress.  Abdominal: Soft. Bowel sounds are normal. She exhibits no distension and no mass. There is no tenderness.  Neurological: She is alert. She displays normal reflexes. No cranial nerve deficit.  Oriented to person and place not time  Skin: Skin is warm and dry.  Has only an oblong erythematous place on her left medial buttock over the sacrum--no longer open (had a small cushioned dressing with tape holding it in place);  On right buttock has palpable scar tissue vs. Cartilage that is mobile,  nontender;  Right hip incision site healed (two small incisions)  Psychiatric: She has a normal mood and affect.    Labs reviewed: Basic Metabolic Panel:  Recent Labs  46/27/03 1023 11/20/13 0924  01/26/14 2038  01/29/14 0319 01/30/14 0330 01/31/14 0945  NA  --  142  < >  --   < > 141 144 145  K  --  4.4  < >  --   < > 4.2 4.1 3.9  CL  --  104  < >  --   < > 106 109 109  CO2  --  23  < >  --   < > 21 22 19   GLUCOSE  --  83  < >  --   < >  125* 121* 120*  BUN  --  18  < >  --   < > 44* 50* 44*  CREATININE  --  0.94  < >  --   < > 1.21* 1.03 0.68  CALCIUM  --  9.7  < >  --   < > 8.7 8.8 9.1  TSH 4.630* 6.250*  --  0.851  --   --   --   --   < > = values in this interval not displayed. Liver Function Tests:  Recent Labs  04/14/13 0956  AST 20  ALT 14  ALKPHOS 70  BILITOT 0.3  PROT 6.1  CBC:  Recent Labs  11/20/13 0924 01/26/14 1630  01/29/14 0319 01/30/14 0330 01/31/14 0945  WBC 5.2 23.2*  < > 13.1* 11.3* 9.6  NEUTROABS 3.3 20.7*  --   --   --   --   HGB 11.8 12.9  < > 9.1* 8.8* 9.5*  HCT 35.2 37.1  < > 27.1* 26.4* 28.6*  MCV 92 88.1  < > 91.9 91.0 91.7  PLT  --  374  < > 254 263 301  < > = values in this interval not displayed. Lipid Panel:  Recent Labs  11/20/13 0924  HDL 56  LDLCALC 77  TRIG 68  CHOLHDL 2.6   Assessment/Plan 1. Vaginal yeast infection - seems this has been ongoing, has been eating yogurt, was treated previously when very ill w/o success--will try again with 3 more diflucan - fluconazole (DIFLUCAN) 150 MG tablet; Take 1 tablet (150 mg total) by mouth once. Daily for 3 days  Dispense: 3 tablet; Refill: 1  2. Hip fracture, right -is recovering well from this (took a long time to recover though) -continues with therapy at home  3. Chronic obstructive asthma, unspecified - had pneumonia with copd exacerbation at hospital and this interfered with her recovery contributed to it  - albuterol (PROVENTIL HFA;VENTOLIN HFA) 108 (90 BASE) MCG/ACT inhaler; Inhale 2 puffs into the lungs every 6 (six) hours as needed. Shortness of breath.  Dispense: 3 Inhaler; Refill: 1  4. Hypothyroidism -cont current synthroid  5. Chronic constipation -regimen working well  6. Sacral decubitus ulcer -nearly resolved again--recommended use of barrier cream and continuing the cushioned type dressing for prevention, frequent positional changes, cont protein supplements and balanced diet  7. Depressive  disorder, not elsewhere classified -stable--improved with zoloft  8. Psychosis -stable with seroquel--was worse during hospitalization and rehab  Next appt:  2 mos

## 2014-04-14 ENCOUNTER — Other Ambulatory Visit: Payer: Self-pay | Admitting: Internal Medicine

## 2014-04-14 ENCOUNTER — Other Ambulatory Visit: Payer: Self-pay | Admitting: *Deleted

## 2014-04-14 DIAGNOSIS — J438 Other emphysema: Secondary | ICD-10-CM | POA: Diagnosis not present

## 2014-04-14 DIAGNOSIS — M069 Rheumatoid arthritis, unspecified: Secondary | ICD-10-CM | POA: Diagnosis not present

## 2014-04-14 DIAGNOSIS — F3289 Other specified depressive episodes: Secondary | ICD-10-CM | POA: Diagnosis not present

## 2014-04-14 DIAGNOSIS — R262 Difficulty in walking, not elsewhere classified: Secondary | ICD-10-CM | POA: Diagnosis not present

## 2014-04-14 DIAGNOSIS — F329 Major depressive disorder, single episode, unspecified: Secondary | ICD-10-CM | POA: Diagnosis not present

## 2014-04-14 DIAGNOSIS — M810 Age-related osteoporosis without current pathological fracture: Secondary | ICD-10-CM | POA: Diagnosis not present

## 2014-04-14 DIAGNOSIS — S72009D Fracture of unspecified part of neck of unspecified femur, subsequent encounter for closed fracture with routine healing: Secondary | ICD-10-CM | POA: Diagnosis not present

## 2014-04-14 MED ORDER — SERTRALINE HCL 100 MG PO TABS: ORAL_TABLET | ORAL | Status: DC

## 2014-04-17 DIAGNOSIS — M810 Age-related osteoporosis without current pathological fracture: Secondary | ICD-10-CM | POA: Diagnosis not present

## 2014-04-17 DIAGNOSIS — S72009D Fracture of unspecified part of neck of unspecified femur, subsequent encounter for closed fracture with routine healing: Secondary | ICD-10-CM | POA: Diagnosis not present

## 2014-04-17 DIAGNOSIS — R262 Difficulty in walking, not elsewhere classified: Secondary | ICD-10-CM | POA: Diagnosis not present

## 2014-04-17 DIAGNOSIS — M069 Rheumatoid arthritis, unspecified: Secondary | ICD-10-CM | POA: Diagnosis not present

## 2014-04-17 DIAGNOSIS — J438 Other emphysema: Secondary | ICD-10-CM | POA: Diagnosis not present

## 2014-04-17 DIAGNOSIS — F329 Major depressive disorder, single episode, unspecified: Secondary | ICD-10-CM | POA: Diagnosis not present

## 2014-04-17 DIAGNOSIS — F3289 Other specified depressive episodes: Secondary | ICD-10-CM | POA: Diagnosis not present

## 2014-04-18 DIAGNOSIS — J438 Other emphysema: Secondary | ICD-10-CM | POA: Diagnosis not present

## 2014-04-18 DIAGNOSIS — F3289 Other specified depressive episodes: Secondary | ICD-10-CM | POA: Diagnosis not present

## 2014-04-18 DIAGNOSIS — S72009D Fracture of unspecified part of neck of unspecified femur, subsequent encounter for closed fracture with routine healing: Secondary | ICD-10-CM | POA: Diagnosis not present

## 2014-04-18 DIAGNOSIS — R262 Difficulty in walking, not elsewhere classified: Secondary | ICD-10-CM | POA: Diagnosis not present

## 2014-04-18 DIAGNOSIS — M069 Rheumatoid arthritis, unspecified: Secondary | ICD-10-CM | POA: Diagnosis not present

## 2014-04-18 DIAGNOSIS — F329 Major depressive disorder, single episode, unspecified: Secondary | ICD-10-CM | POA: Diagnosis not present

## 2014-04-18 DIAGNOSIS — M810 Age-related osteoporosis without current pathological fracture: Secondary | ICD-10-CM | POA: Diagnosis not present

## 2014-04-20 DIAGNOSIS — F3289 Other specified depressive episodes: Secondary | ICD-10-CM | POA: Diagnosis not present

## 2014-04-20 DIAGNOSIS — M069 Rheumatoid arthritis, unspecified: Secondary | ICD-10-CM | POA: Diagnosis not present

## 2014-04-20 DIAGNOSIS — J438 Other emphysema: Secondary | ICD-10-CM | POA: Diagnosis not present

## 2014-04-20 DIAGNOSIS — M810 Age-related osteoporosis without current pathological fracture: Secondary | ICD-10-CM | POA: Diagnosis not present

## 2014-04-20 DIAGNOSIS — F329 Major depressive disorder, single episode, unspecified: Secondary | ICD-10-CM | POA: Diagnosis not present

## 2014-04-20 DIAGNOSIS — S72009D Fracture of unspecified part of neck of unspecified femur, subsequent encounter for closed fracture with routine healing: Secondary | ICD-10-CM | POA: Diagnosis not present

## 2014-04-20 DIAGNOSIS — R262 Difficulty in walking, not elsewhere classified: Secondary | ICD-10-CM | POA: Diagnosis not present

## 2014-04-23 DIAGNOSIS — S72009D Fracture of unspecified part of neck of unspecified femur, subsequent encounter for closed fracture with routine healing: Secondary | ICD-10-CM | POA: Diagnosis not present

## 2014-04-23 DIAGNOSIS — M069 Rheumatoid arthritis, unspecified: Secondary | ICD-10-CM | POA: Diagnosis not present

## 2014-04-23 DIAGNOSIS — M810 Age-related osteoporosis without current pathological fracture: Secondary | ICD-10-CM | POA: Diagnosis not present

## 2014-04-23 DIAGNOSIS — J438 Other emphysema: Secondary | ICD-10-CM | POA: Diagnosis not present

## 2014-04-23 DIAGNOSIS — R262 Difficulty in walking, not elsewhere classified: Secondary | ICD-10-CM | POA: Diagnosis not present

## 2014-04-23 DIAGNOSIS — F329 Major depressive disorder, single episode, unspecified: Secondary | ICD-10-CM | POA: Diagnosis not present

## 2014-04-23 DIAGNOSIS — F3289 Other specified depressive episodes: Secondary | ICD-10-CM | POA: Diagnosis not present

## 2014-04-24 ENCOUNTER — Telehealth: Payer: Self-pay | Admitting: *Deleted

## 2014-04-24 DIAGNOSIS — J438 Other emphysema: Secondary | ICD-10-CM | POA: Diagnosis not present

## 2014-04-24 DIAGNOSIS — F3289 Other specified depressive episodes: Secondary | ICD-10-CM | POA: Diagnosis not present

## 2014-04-24 DIAGNOSIS — M069 Rheumatoid arthritis, unspecified: Secondary | ICD-10-CM | POA: Diagnosis not present

## 2014-04-24 DIAGNOSIS — R262 Difficulty in walking, not elsewhere classified: Secondary | ICD-10-CM | POA: Diagnosis not present

## 2014-04-24 DIAGNOSIS — S72009D Fracture of unspecified part of neck of unspecified femur, subsequent encounter for closed fracture with routine healing: Secondary | ICD-10-CM | POA: Diagnosis not present

## 2014-04-24 DIAGNOSIS — F329 Major depressive disorder, single episode, unspecified: Secondary | ICD-10-CM | POA: Diagnosis not present

## 2014-04-24 DIAGNOSIS — M810 Age-related osteoporosis without current pathological fracture: Secondary | ICD-10-CM | POA: Diagnosis not present

## 2014-04-24 NOTE — Telephone Encounter (Signed)
[name] Notified.

## 2014-04-24 NOTE — Telephone Encounter (Signed)
[name] called and stated that patient's Yeast Infection is no better. Was seen on 04/13/2014 by Dr. Renato Gails and given Diflucan for 3 days. Still no better and wants to know if another coarse can be called into pharmacy. Please Advise.

## 2014-04-24 NOTE — Telephone Encounter (Signed)
May use OTC monostat- 3 day course should help if not better call next week to be seen

## 2014-04-27 DIAGNOSIS — S72009D Fracture of unspecified part of neck of unspecified femur, subsequent encounter for closed fracture with routine healing: Secondary | ICD-10-CM | POA: Diagnosis not present

## 2014-04-27 DIAGNOSIS — M069 Rheumatoid arthritis, unspecified: Secondary | ICD-10-CM | POA: Diagnosis not present

## 2014-04-27 DIAGNOSIS — F329 Major depressive disorder, single episode, unspecified: Secondary | ICD-10-CM | POA: Diagnosis not present

## 2014-04-27 DIAGNOSIS — J438 Other emphysema: Secondary | ICD-10-CM | POA: Diagnosis not present

## 2014-04-27 DIAGNOSIS — R262 Difficulty in walking, not elsewhere classified: Secondary | ICD-10-CM | POA: Diagnosis not present

## 2014-04-27 DIAGNOSIS — F3289 Other specified depressive episodes: Secondary | ICD-10-CM | POA: Diagnosis not present

## 2014-04-27 DIAGNOSIS — M810 Age-related osteoporosis without current pathological fracture: Secondary | ICD-10-CM | POA: Diagnosis not present

## 2014-04-28 DIAGNOSIS — M069 Rheumatoid arthritis, unspecified: Secondary | ICD-10-CM | POA: Diagnosis not present

## 2014-04-28 DIAGNOSIS — F3289 Other specified depressive episodes: Secondary | ICD-10-CM | POA: Diagnosis not present

## 2014-04-28 DIAGNOSIS — S72009D Fracture of unspecified part of neck of unspecified femur, subsequent encounter for closed fracture with routine healing: Secondary | ICD-10-CM | POA: Diagnosis not present

## 2014-04-28 DIAGNOSIS — J438 Other emphysema: Secondary | ICD-10-CM | POA: Diagnosis not present

## 2014-04-28 DIAGNOSIS — R262 Difficulty in walking, not elsewhere classified: Secondary | ICD-10-CM | POA: Diagnosis not present

## 2014-04-28 DIAGNOSIS — F329 Major depressive disorder, single episode, unspecified: Secondary | ICD-10-CM | POA: Diagnosis not present

## 2014-04-28 DIAGNOSIS — M810 Age-related osteoporosis without current pathological fracture: Secondary | ICD-10-CM | POA: Diagnosis not present

## 2014-04-30 DIAGNOSIS — F329 Major depressive disorder, single episode, unspecified: Secondary | ICD-10-CM | POA: Diagnosis not present

## 2014-04-30 DIAGNOSIS — M810 Age-related osteoporosis without current pathological fracture: Secondary | ICD-10-CM | POA: Diagnosis not present

## 2014-04-30 DIAGNOSIS — J438 Other emphysema: Secondary | ICD-10-CM | POA: Diagnosis not present

## 2014-04-30 DIAGNOSIS — R262 Difficulty in walking, not elsewhere classified: Secondary | ICD-10-CM | POA: Diagnosis not present

## 2014-04-30 DIAGNOSIS — S72009D Fracture of unspecified part of neck of unspecified femur, subsequent encounter for closed fracture with routine healing: Secondary | ICD-10-CM | POA: Diagnosis not present

## 2014-04-30 DIAGNOSIS — M069 Rheumatoid arthritis, unspecified: Secondary | ICD-10-CM | POA: Diagnosis not present

## 2014-04-30 DIAGNOSIS — F3289 Other specified depressive episodes: Secondary | ICD-10-CM | POA: Diagnosis not present

## 2014-05-04 DIAGNOSIS — F329 Major depressive disorder, single episode, unspecified: Secondary | ICD-10-CM | POA: Diagnosis not present

## 2014-05-04 DIAGNOSIS — M069 Rheumatoid arthritis, unspecified: Secondary | ICD-10-CM | POA: Diagnosis not present

## 2014-05-04 DIAGNOSIS — M810 Age-related osteoporosis without current pathological fracture: Secondary | ICD-10-CM | POA: Diagnosis not present

## 2014-05-04 DIAGNOSIS — F3289 Other specified depressive episodes: Secondary | ICD-10-CM | POA: Diagnosis not present

## 2014-05-04 DIAGNOSIS — R262 Difficulty in walking, not elsewhere classified: Secondary | ICD-10-CM | POA: Diagnosis not present

## 2014-05-04 DIAGNOSIS — J438 Other emphysema: Secondary | ICD-10-CM | POA: Diagnosis not present

## 2014-05-04 DIAGNOSIS — S72009D Fracture of unspecified part of neck of unspecified femur, subsequent encounter for closed fracture with routine healing: Secondary | ICD-10-CM | POA: Diagnosis not present

## 2014-05-06 DIAGNOSIS — M810 Age-related osteoporosis without current pathological fracture: Secondary | ICD-10-CM | POA: Diagnosis not present

## 2014-05-06 DIAGNOSIS — F329 Major depressive disorder, single episode, unspecified: Secondary | ICD-10-CM | POA: Diagnosis not present

## 2014-05-06 DIAGNOSIS — R262 Difficulty in walking, not elsewhere classified: Secondary | ICD-10-CM | POA: Diagnosis not present

## 2014-05-06 DIAGNOSIS — J438 Other emphysema: Secondary | ICD-10-CM | POA: Diagnosis not present

## 2014-05-06 DIAGNOSIS — M069 Rheumatoid arthritis, unspecified: Secondary | ICD-10-CM | POA: Diagnosis not present

## 2014-05-06 DIAGNOSIS — F3289 Other specified depressive episodes: Secondary | ICD-10-CM | POA: Diagnosis not present

## 2014-05-06 DIAGNOSIS — S72009D Fracture of unspecified part of neck of unspecified femur, subsequent encounter for closed fracture with routine healing: Secondary | ICD-10-CM | POA: Diagnosis not present

## 2014-05-11 DIAGNOSIS — J438 Other emphysema: Secondary | ICD-10-CM | POA: Diagnosis not present

## 2014-05-11 DIAGNOSIS — S72009D Fracture of unspecified part of neck of unspecified femur, subsequent encounter for closed fracture with routine healing: Secondary | ICD-10-CM | POA: Diagnosis not present

## 2014-05-11 DIAGNOSIS — R262 Difficulty in walking, not elsewhere classified: Secondary | ICD-10-CM | POA: Diagnosis not present

## 2014-05-11 DIAGNOSIS — M069 Rheumatoid arthritis, unspecified: Secondary | ICD-10-CM | POA: Diagnosis not present

## 2014-05-11 DIAGNOSIS — F329 Major depressive disorder, single episode, unspecified: Secondary | ICD-10-CM | POA: Diagnosis not present

## 2014-05-11 DIAGNOSIS — F3289 Other specified depressive episodes: Secondary | ICD-10-CM | POA: Diagnosis not present

## 2014-05-11 DIAGNOSIS — M810 Age-related osteoporosis without current pathological fracture: Secondary | ICD-10-CM | POA: Diagnosis not present

## 2014-05-12 DIAGNOSIS — M810 Age-related osteoporosis without current pathological fracture: Secondary | ICD-10-CM | POA: Diagnosis not present

## 2014-05-12 DIAGNOSIS — M069 Rheumatoid arthritis, unspecified: Secondary | ICD-10-CM | POA: Diagnosis not present

## 2014-05-12 DIAGNOSIS — J438 Other emphysema: Secondary | ICD-10-CM | POA: Diagnosis not present

## 2014-05-12 DIAGNOSIS — R262 Difficulty in walking, not elsewhere classified: Secondary | ICD-10-CM | POA: Diagnosis not present

## 2014-05-12 DIAGNOSIS — F3289 Other specified depressive episodes: Secondary | ICD-10-CM | POA: Diagnosis not present

## 2014-05-12 DIAGNOSIS — F329 Major depressive disorder, single episode, unspecified: Secondary | ICD-10-CM | POA: Diagnosis not present

## 2014-05-12 DIAGNOSIS — S72009D Fracture of unspecified part of neck of unspecified femur, subsequent encounter for closed fracture with routine healing: Secondary | ICD-10-CM | POA: Diagnosis not present

## 2014-05-15 DIAGNOSIS — F3289 Other specified depressive episodes: Secondary | ICD-10-CM | POA: Diagnosis not present

## 2014-05-15 DIAGNOSIS — R262 Difficulty in walking, not elsewhere classified: Secondary | ICD-10-CM | POA: Diagnosis not present

## 2014-05-15 DIAGNOSIS — M069 Rheumatoid arthritis, unspecified: Secondary | ICD-10-CM | POA: Diagnosis not present

## 2014-05-15 DIAGNOSIS — S72009D Fracture of unspecified part of neck of unspecified femur, subsequent encounter for closed fracture with routine healing: Secondary | ICD-10-CM | POA: Diagnosis not present

## 2014-05-15 DIAGNOSIS — F329 Major depressive disorder, single episode, unspecified: Secondary | ICD-10-CM | POA: Diagnosis not present

## 2014-05-15 DIAGNOSIS — M810 Age-related osteoporosis without current pathological fracture: Secondary | ICD-10-CM | POA: Diagnosis not present

## 2014-05-15 DIAGNOSIS — J438 Other emphysema: Secondary | ICD-10-CM | POA: Diagnosis not present

## 2014-05-18 DIAGNOSIS — R262 Difficulty in walking, not elsewhere classified: Secondary | ICD-10-CM | POA: Diagnosis not present

## 2014-05-18 DIAGNOSIS — F3289 Other specified depressive episodes: Secondary | ICD-10-CM | POA: Diagnosis not present

## 2014-05-18 DIAGNOSIS — M069 Rheumatoid arthritis, unspecified: Secondary | ICD-10-CM | POA: Diagnosis not present

## 2014-05-18 DIAGNOSIS — S72009D Fracture of unspecified part of neck of unspecified femur, subsequent encounter for closed fracture with routine healing: Secondary | ICD-10-CM | POA: Diagnosis not present

## 2014-05-18 DIAGNOSIS — J438 Other emphysema: Secondary | ICD-10-CM | POA: Diagnosis not present

## 2014-05-18 DIAGNOSIS — M810 Age-related osteoporosis without current pathological fracture: Secondary | ICD-10-CM | POA: Diagnosis not present

## 2014-05-18 DIAGNOSIS — F329 Major depressive disorder, single episode, unspecified: Secondary | ICD-10-CM | POA: Diagnosis not present

## 2014-05-21 DIAGNOSIS — F329 Major depressive disorder, single episode, unspecified: Secondary | ICD-10-CM | POA: Diagnosis not present

## 2014-05-21 DIAGNOSIS — J438 Other emphysema: Secondary | ICD-10-CM | POA: Diagnosis not present

## 2014-05-21 DIAGNOSIS — F3289 Other specified depressive episodes: Secondary | ICD-10-CM | POA: Diagnosis not present

## 2014-05-21 DIAGNOSIS — R262 Difficulty in walking, not elsewhere classified: Secondary | ICD-10-CM | POA: Diagnosis not present

## 2014-05-21 DIAGNOSIS — M069 Rheumatoid arthritis, unspecified: Secondary | ICD-10-CM | POA: Diagnosis not present

## 2014-05-21 DIAGNOSIS — M810 Age-related osteoporosis without current pathological fracture: Secondary | ICD-10-CM | POA: Diagnosis not present

## 2014-05-21 DIAGNOSIS — S72009D Fracture of unspecified part of neck of unspecified femur, subsequent encounter for closed fracture with routine healing: Secondary | ICD-10-CM | POA: Diagnosis not present

## 2014-05-22 DIAGNOSIS — M810 Age-related osteoporosis without current pathological fracture: Secondary | ICD-10-CM | POA: Diagnosis not present

## 2014-05-22 DIAGNOSIS — M069 Rheumatoid arthritis, unspecified: Secondary | ICD-10-CM | POA: Diagnosis not present

## 2014-05-22 DIAGNOSIS — J438 Other emphysema: Secondary | ICD-10-CM | POA: Diagnosis not present

## 2014-05-22 DIAGNOSIS — F329 Major depressive disorder, single episode, unspecified: Secondary | ICD-10-CM | POA: Diagnosis not present

## 2014-05-22 DIAGNOSIS — F3289 Other specified depressive episodes: Secondary | ICD-10-CM | POA: Diagnosis not present

## 2014-05-22 DIAGNOSIS — R262 Difficulty in walking, not elsewhere classified: Secondary | ICD-10-CM | POA: Diagnosis not present

## 2014-05-22 DIAGNOSIS — S72009D Fracture of unspecified part of neck of unspecified femur, subsequent encounter for closed fracture with routine healing: Secondary | ICD-10-CM | POA: Diagnosis not present

## 2014-05-26 DIAGNOSIS — M069 Rheumatoid arthritis, unspecified: Secondary | ICD-10-CM | POA: Diagnosis not present

## 2014-05-26 DIAGNOSIS — F3289 Other specified depressive episodes: Secondary | ICD-10-CM | POA: Diagnosis not present

## 2014-05-26 DIAGNOSIS — J438 Other emphysema: Secondary | ICD-10-CM | POA: Diagnosis not present

## 2014-05-26 DIAGNOSIS — R262 Difficulty in walking, not elsewhere classified: Secondary | ICD-10-CM | POA: Diagnosis not present

## 2014-05-26 DIAGNOSIS — M810 Age-related osteoporosis without current pathological fracture: Secondary | ICD-10-CM | POA: Diagnosis not present

## 2014-05-26 DIAGNOSIS — F329 Major depressive disorder, single episode, unspecified: Secondary | ICD-10-CM | POA: Diagnosis not present

## 2014-05-26 DIAGNOSIS — S72009D Fracture of unspecified part of neck of unspecified femur, subsequent encounter for closed fracture with routine healing: Secondary | ICD-10-CM | POA: Diagnosis not present

## 2014-05-29 DIAGNOSIS — S72009D Fracture of unspecified part of neck of unspecified femur, subsequent encounter for closed fracture with routine healing: Secondary | ICD-10-CM | POA: Diagnosis not present

## 2014-05-29 DIAGNOSIS — F3289 Other specified depressive episodes: Secondary | ICD-10-CM | POA: Diagnosis not present

## 2014-05-29 DIAGNOSIS — R262 Difficulty in walking, not elsewhere classified: Secondary | ICD-10-CM | POA: Diagnosis not present

## 2014-05-29 DIAGNOSIS — J438 Other emphysema: Secondary | ICD-10-CM | POA: Diagnosis not present

## 2014-05-29 DIAGNOSIS — M069 Rheumatoid arthritis, unspecified: Secondary | ICD-10-CM | POA: Diagnosis not present

## 2014-05-29 DIAGNOSIS — F329 Major depressive disorder, single episode, unspecified: Secondary | ICD-10-CM | POA: Diagnosis not present

## 2014-05-29 DIAGNOSIS — M810 Age-related osteoporosis without current pathological fracture: Secondary | ICD-10-CM | POA: Diagnosis not present

## 2014-06-05 ENCOUNTER — Other Ambulatory Visit: Payer: Self-pay | Admitting: Internal Medicine

## 2014-06-11 ENCOUNTER — Ambulatory Visit (INDEPENDENT_AMBULATORY_CARE_PROVIDER_SITE_OTHER): Payer: Medicare Other | Admitting: Internal Medicine

## 2014-06-11 ENCOUNTER — Encounter: Payer: Self-pay | Admitting: Internal Medicine

## 2014-06-11 VITALS — BP 150/70 | HR 74 | Temp 97.5°F | Resp 18 | Ht 63.0 in | Wt 109.0 lb

## 2014-06-11 DIAGNOSIS — L659 Nonscarring hair loss, unspecified: Secondary | ICD-10-CM | POA: Diagnosis not present

## 2014-06-11 DIAGNOSIS — R229 Localized swelling, mass and lump, unspecified: Secondary | ICD-10-CM | POA: Diagnosis not present

## 2014-06-11 DIAGNOSIS — E0789 Other specified disorders of thyroid: Secondary | ICD-10-CM | POA: Diagnosis not present

## 2014-06-11 DIAGNOSIS — E034 Atrophy of thyroid (acquired): Secondary | ICD-10-CM

## 2014-06-11 DIAGNOSIS — F3289 Other specified depressive episodes: Secondary | ICD-10-CM | POA: Diagnosis not present

## 2014-06-11 DIAGNOSIS — E038 Other specified hypothyroidism: Secondary | ICD-10-CM

## 2014-06-11 DIAGNOSIS — N899 Noninflammatory disorder of vagina, unspecified: Secondary | ICD-10-CM | POA: Diagnosis not present

## 2014-06-11 DIAGNOSIS — J449 Chronic obstructive pulmonary disease, unspecified: Secondary | ICD-10-CM | POA: Diagnosis not present

## 2014-06-11 DIAGNOSIS — F329 Major depressive disorder, single episode, unspecified: Secondary | ICD-10-CM

## 2014-06-11 DIAGNOSIS — J4489 Other specified chronic obstructive pulmonary disease: Secondary | ICD-10-CM | POA: Insufficient documentation

## 2014-06-11 DIAGNOSIS — E43 Unspecified severe protein-calorie malnutrition: Secondary | ICD-10-CM

## 2014-06-11 DIAGNOSIS — N898 Other specified noninflammatory disorders of vagina: Secondary | ICD-10-CM | POA: Insufficient documentation

## 2014-06-11 DIAGNOSIS — I10 Essential (primary) hypertension: Secondary | ICD-10-CM

## 2014-06-11 NOTE — Progress Notes (Signed)
Patient ID: Jane [name], female   DOB: September 12, 1925, 80 y.o.   MRN: [id number]   Location:  [facility] / [place] [facility]  Code Status: DNR, has living will and hcpoa  Allergies  Allergen Reactions  . Celebrex [Celecoxib]     unknown  . Chlorzoxazone     unknown    Chief Complaint  Patient presents with  . Medical Management of Chronic Issues    2 mo f/u, lump on rt buttock, vaginal itching,loss of hair?    HPI: Patient is a 80 y.o. white female seen in the office today for medical mgt of chronic diseases.  She has several concerns:  2 rounds of diflucan didn't help.  Stopped using other creams that shouldn't be used there.  Using vagisil cream and cleansers.  She is very concerned about the bump on her bottom.  Gained 1 lb since last week.  Has not been drinking her boost.    7/25 felt hair was falling out--stopped thyroid med for a week. Restarted it.    Has right inguinal hernia.  Review of Systems:  Review of Systems  Constitutional: Positive for malaise/fatigue. Negative for fever and chills.       Only gained 1 lb since last visit  HENT: Negative for congestion.   Eyes: Negative for blurred vision.  Respiratory: Negative for cough and shortness of breath.   Cardiovascular: Negative for chest pain and leg swelling.  Gastrointestinal: Negative for heartburn, abdominal pain, constipation, blood in stool and melena.  Genitourinary: Negative for dysuria, urgency and frequency.       Vaginal irritation  Musculoskeletal: Negative for falls and myalgias.  Skin:       Right buttock mass  Neurological: Positive for weakness. Negative for dizziness and loss of consciousness.  Endo/Heme/Allergies: Does not bruise/bleed easily.  Psychiatric/Behavioral: Positive for memory loss. Negative for depression. The patient is nervous/anxious.      Past Medical History  Diagnosis Date  . Abdominal pain, epigastric   . Impacted cerumen   . Abnormality of  gait   . Anal fissure   . Loss of weight   . Hyposmolality and/or hyponatremia   . Pain in limb   . Other vitamin B12 deficiency anemia   . Chest pain, unspecified   . Anemia, unspecified   . Anxiety state, unspecified   . Abdominal pain, right upper quadrant   . Other pulmonary embolism and infarction   . Intestinal or peritoneal adhesions with obstruction (postoperative) (postinfection)   . Long term (current) use of anticoagulants   . Abdominal pain, right lower quadrant   . Essential and other specified forms of tremor   . Lumbago   . Unspecified hypothyroidism   . Major depressive disorder, single episode, unspecified   . Other and unspecified hyperlipidemia   . Macular degeneration (senile) of retina, unspecified   . Unspecified glaucoma   . Unspecified cataract   . Unspecified hearing loss   . Unspecified essential hypertension   . Osteoarthrosis, unspecified whether generalized or localized, unspecified site   . Osteoporosis, unspecified   . Abnormal involuntary movements(781.0)   . Emphysema of lung     Past Surgical History  Procedure Laterality Date  . Excisional hemorrhoidectomy  1980  . Bladder suspension  1993  . Back surgery  2003  . Abdominal hysterectomy    . Trigger finger release  2009    Dr Teressa Senter  . Intramedullary (im) nail intertrochanteric Right 01/27/2014    Procedure: INTRAMEDULLARY (  IM) NAIL INTERTROCHANTRIC;  Surgeon: Sheral Apley, MD;  Location: [facility] OR;  Service: Orthopedics;  Laterality: Right;    Social History:   reports that she has quit smoking. Her smoking use included Cigarettes. She smoked 0.00 packs per day for 40 years. She does not have any smokeless tobacco history on file. She reports that she does not drink alcohol or use illicit drugs.  Family History  Problem Relation Age of Onset  . Congestive Heart Failure Mother   . Macular degeneration Mother   . Heart disease Mother   . Cancer Sister     lung cancer  . Arthritis  Daughter   . Heart disease Daughter   . Glaucoma Sister   . Cataracts Sister     Medications: Patient's Medications  New Prescriptions   No medications on file  Previous Medications   ACETAMINOPHEN (TYLENOL) 650 MG CR TABLET    Take 650 mg by mouth every 8 (eight) hours as needed for pain.   ALBUTEROL (PROVENTIL HFA;VENTOLIN HFA) 108 (90 BASE) MCG/ACT INHALER    Inhale 2 puffs into the lungs every 6 (six) hours as needed. Shortness of breath.   AMLODIPINE (NORVASC) 5 MG TABLET    Take 1 tablet (5 mg total) by mouth daily.   CALCIUM CARBONATE (TUMS - DOSED IN MG ELEMENTAL CALCIUM) 500 MG CHEWABLE TABLET    Chew 1 tablet by mouth daily as needed for indigestion or heartburn.   CALCIUM-VITAMIN D (OSCAL WITH D) 500-200 MG-UNIT PER TABLET    Take 1 tablet by mouth daily.   DICYCLOMINE (BENTYL) 10 MG CAPSULE    Take 10 mg by mouth at bedtime.   DOCUSATE SODIUM (COLACE) 100 MG CAPSULE    Take 1 capsule (100 mg total) by mouth 2 (two) times daily. Continue this while taking narcotics to help with bowel movements   FERROUS SULFATE 325 (65 FE) MG TABLET    Take 325 mg by mouth every other day.   FEXOFENADINE (ALLEGRA) 180 MG TABLET    Take 180 mg by mouth daily as needed for allergies or rhinitis.   FLUCONAZOLE (DIFLUCAN) 150 MG TABLET    Take 1 tablet (150 mg total) by mouth once. Daily for 3 days   HYDROCORTISONE (ANUSOL-HC) 2.5 % RECTAL CREAM    Place 1 application rectally daily as needed.   LATANOPROST (XALATAN) 0.005 % OPHTHALMIC SOLUTION    Place 1 drop into both eyes at bedtime.   LEVOTHYROXINE (SYNTHROID, LEVOTHROID) 75 MCG TABLET    Take 1 tablet (75 mcg total) by mouth daily before breakfast.   LISINOPRIL (PRINIVIL,ZESTRIL) 40 MG TABLET    Take 1 tablet (40 mg total) by mouth daily.   LORAZEPAM (ATIVAN) 0.5 MG TABLET    TAKE 1 TABLET BY MOUTH AT BEDTIME AS NEEDED FOR ANXIETY   LOVASTATIN (MEVACOR) 20 MG TABLET    Take 1 tablet (20 mg total) by mouth every evening.   MULTIPLE  VITAMINS-MINERALS (ICAPS LUTEIN & OMEGA-3) CAPS    Take 1 capsule by mouth daily.   QUETIAPINE (SEROQUEL) 25 MG TABLET    Take 1 tablet (25 mg total) by mouth at bedtime.   SERTRALINE (ZOLOFT) 100 MG TABLET    Take one tablet by mouth once daily   VITAMIN B-12 (CYANOCOBALAMIN) 100 MCG TABLET    Take 100 mcg by mouth daily.  Modified Medications   No medications on file  Discontinued Medications   No medications on file     Physical Exam: Ceasar Mons  Vitals:   06/11/14 1104  BP: 150/70  Pulse: 74  Temp: 97.5 F (36.4 C)  TempSrc: Oral  Resp: 18  Height: 5\' 3"  (1.6 m)  Weight: 109 lb (49.442 kg)  SpO2: 96%  Physical Exam  Constitutional: She is oriented to person, place, and time. No distress.  Frail white female ambulates with rolling walker  Cardiovascular: Normal rate, regular rhythm, normal heart sounds and intact distal pulses.   Pulmonary/Chest: Effort normal and breath sounds normal. No respiratory distress.  Abdominal: Soft. Bowel sounds are normal. She exhibits no distension and no mass. There is no tenderness.  Genitourinary: No vaginal discharge found.  Labia are drooping and erythematous, no drainage or thick white material this time, tender during exam  Musculoskeletal: Normal range of motion. She exhibits no edema and no tenderness.  Neurological: She is alert and oriented to person, place, and time.  Skin: Skin is warm and dry.  Slight superficial excoriation of right buttock, palpable medial mass of right buttock that is slightly tender   Psychiatric: She has a normal mood and affect.     Labs reviewed: Basic Metabolic Panel:  Recent Labs  1023 11/20/13 0924  01/26/14 2038  01/29/14 0319 01/30/14 0330 01/31/14 0945  NA  --  142  < >  --   < > 141 144 145  K  --  4.4  < >  --   < > 4.2 4.1 3.9  CL  --  104  < >  --   < > 106 109 109  CO2  --  23  < >  --   < > 21 22 19   GLUCOSE  --  83  < >  --   < > 125* 121* 120*  BUN  --  18  < >  --   < > 44*  50* 44*  CREATININE  --  0.94  < >  --   < > 1.21* 1.03 0.68  CALCIUM  --  9.7  < >  --   < > 8.7 8.8 9.1  TSH 4.630* 6.250*  --  0.851  --   --   --   --   < > = values in this interval not displayed. Liver Function Tests: No results found for this basename: AST, ALT, ALKPHOS, BILITOT, PROT, ALBUMIN,  in the last 8760 hours No results found for this basename: LIPASE, AMYLASE,  in the last 8760 hours No results found for this basename: AMMONIA,  in the last 8760 hours CBC:  Recent Labs  11/20/13 0924 01/26/14 1630  01/29/14 0319 01/30/14 0330 01/31/14 0945  WBC 5.2 23.2*  < > 13.1* 11.3* 9.6  NEUTROABS 3.3 20.7*  --   --   --   --   HGB 11.8 12.9  < > 9.1* 8.8* 9.5*  HCT 35.2 37.1  < > 27.1* 26.4* 28.6*  MCV 92 88.1  < > 91.9 91.0 91.7  PLT  --  374  < > 254 263 301  < > = values in this interval not displayed. Lipid Panel:  Recent Labs  11/20/13 0924  HDL 56  LDLCALC 77  TRIG 68  CHOLHDL 2.6   No results found for this basename: HGBA1C    Assessment/Plan 1. Vaginal irritation -persists despite treatment with diflucan for yeast infection (was present last visit) and with vagisil moisturizing cleanser and cream--no long appears dry and atrophied, but remains very tender and irritated--does have some urinary incontinence,  but I want to be sure nothing more serious is going on with her and to see if there are recommendations for comfort purposes  - Ambulatory referral to Gynecology  2. Localized skin mass, lump, or swelling -pt is very anxious and will not accept that this lump is likely cartilage tissue so will image with ultrasound to confirm this - US Pelvis Limited; Future  3. Chronic obstructive asthma, unspecified -doing well without oxygen therapy, no complaints  4. Depressive disorder, not elsewhere classified -mood stable, but does have anxiety since her hip fracture and rehab stay--no med changes made today  5. Hypothyroidism due to acquired atrophy of  thyroid - had missed a week of medication so will recheck tsh - TSH  6. Hair loss -suspect this is due to malnutrition (had stopped eating altogether at one point during her therapy, but is eating now)--encouraged resuming boost - TSH  7. Essential hypertension, benign -bp at goal for her age - Comprehensive metabolic panel  8. Protein-calorie malnutrition, severe -will reassess protein stores - Comprehensive metabolic panel - Prealbumin  Labs/tests ordered:   Orders Placed This Encounter  Procedures  . US Pelvis Limited    WT-109/NO PREV/PT USES WALKER/EPIC ORDER/[facility]    Standing Status: Future     Number of Occurrences:      Standing Expiration Date: 08/12/2015    Order Specific Question:  Reason for Exam (SYMPTOM  OR DIAGNOSIS REQUIRED)    Answer:  tender right buttock mass     Comments:  please ultrasound her right buttock    Order Specific Question:  Preferred imaging location?    Answer:  [facility]  . TSH  . Comprehensive metabolic panel  . Prealbumin  . Ambulatory referral to Gynecology    Referral Priority:  Routine    Referral Type:  Consultation    Referral Reason:  Specialty Services Required    Requested Specialty:  Gynecology    Number of Visits Requested:  1    Next appt:  2 mos

## 2014-06-12 LAB — COMPREHENSIVE METABOLIC PANEL
ALT: 7 IU/L (ref 0–32)
AST: 15 IU/L (ref 0–40)
Albumin/Globulin Ratio: 2.2 (ref 1.1–2.5)
Albumin: 4.6 g/dL (ref 3.5–4.7)
Alkaline Phosphatase: 73 IU/L (ref 39–117)
BUN/Creatinine Ratio: 28 — ABNORMAL HIGH (ref 11–26)
BUN: 23 mg/dL (ref 8–27)
CO2: 26 mmol/L (ref 18–29)
Calcium: 9.9 mg/dL (ref 8.7–10.3)
Chloride: 99 mmol/L (ref 97–108)
Creatinine, Ser: 0.81 mg/dL (ref 0.57–1.00)
GFR calc Af Amer: 76 mL/min/{1.73_m2} (ref >59)
GFR calc non Af Amer: 66 mL/min/{1.73_m2} (ref >59)
Globulin, Total: 2.1 g/dL (ref 1.5–4.5)
Glucose: 82 mg/dL (ref 65–99)
Potassium: 4.4 mmol/L (ref 3.5–5.2)
Sodium: 143 mmol/L (ref 134–144)
Total Bilirubin: 0.5 mg/dL (ref 0.0–1.2)
Total Protein: 6.7 g/dL (ref 6.0–8.5)

## 2014-06-12 LAB — TSH: TSH: 3.05 u[IU]/mL (ref 0.450–4.500)

## 2014-06-12 LAB — PREALBUMIN: Prealbumin: 23 mg/dL (ref 20–40)

## 2014-06-22 ENCOUNTER — Ambulatory Visit
Admission: RE | Admit: 2014-06-22 | Discharge: 2014-06-22 | Disposition: A | Discharge status: Home / Self Care | Payer: Medicare Other | Source: Ambulatory Visit | Attending: Internal Medicine | Admitting: Internal Medicine

## 2014-06-22 DIAGNOSIS — R229 Localized swelling, mass and lump, unspecified: Secondary | ICD-10-CM

## 2014-06-22 DIAGNOSIS — R1900 Intra-abdominal and pelvic swelling, mass and lump, unspecified site: Secondary | ICD-10-CM | POA: Diagnosis not present

## 2014-06-25 ENCOUNTER — Inpatient Hospital Stay (HOSPITAL_COMMUNITY)
Admission: EM | Admit: 2014-06-25 | Discharge: 2014-06-29 | DRG: 481 | Disposition: A | Discharge status: Skilled Nursing Facility | Payer: Medicare Other | Attending: Internal Medicine | Admitting: Internal Medicine

## 2014-06-25 ENCOUNTER — Emergency Department (HOSPITAL_COMMUNITY): Payer: Medicare Other

## 2014-06-25 ENCOUNTER — Encounter (HOSPITAL_COMMUNITY): Payer: Self-pay | Admitting: Emergency Medicine

## 2014-06-25 DIAGNOSIS — Z87891 Personal history of nicotine dependence: Secondary | ICD-10-CM

## 2014-06-25 DIAGNOSIS — F064 Anxiety disorder due to known physiological condition: Secondary | ICD-10-CM | POA: Diagnosis not present

## 2014-06-25 DIAGNOSIS — E038 Other specified hypothyroidism: Secondary | ICD-10-CM | POA: Diagnosis present

## 2014-06-25 DIAGNOSIS — M6281 Muscle weakness (generalized): Secondary | ICD-10-CM | POA: Diagnosis not present

## 2014-06-25 DIAGNOSIS — S72143A Displaced intertrochanteric fracture of unspecified femur, initial encounter for closed fracture: Principal | ICD-10-CM | POA: Diagnosis present

## 2014-06-25 DIAGNOSIS — Z7901 Long term (current) use of anticoagulants: Secondary | ICD-10-CM

## 2014-06-25 DIAGNOSIS — M25559 Pain in unspecified hip: Secondary | ICD-10-CM | POA: Diagnosis not present

## 2014-06-25 DIAGNOSIS — D518 Other vitamin B12 deficiency anemias: Secondary | ICD-10-CM | POA: Diagnosis not present

## 2014-06-25 DIAGNOSIS — J438 Other emphysema: Secondary | ICD-10-CM | POA: Diagnosis present

## 2014-06-25 DIAGNOSIS — R488 Other symbolic dysfunctions: Secondary | ICD-10-CM | POA: Diagnosis not present

## 2014-06-25 DIAGNOSIS — J4489 Other specified chronic obstructive pulmonary disease: Secondary | ICD-10-CM | POA: Diagnosis present

## 2014-06-25 DIAGNOSIS — R0902 Hypoxemia: Secondary | ICD-10-CM | POA: Diagnosis present

## 2014-06-25 DIAGNOSIS — H3530 Unspecified macular degeneration: Secondary | ICD-10-CM | POA: Diagnosis present

## 2014-06-25 DIAGNOSIS — M810 Age-related osteoporosis without current pathological fracture: Secondary | ICD-10-CM | POA: Diagnosis present

## 2014-06-25 DIAGNOSIS — S72002A Fracture of unspecified part of neck of left femur, initial encounter for closed fracture: Secondary | ICD-10-CM

## 2014-06-25 DIAGNOSIS — D62 Acute posthemorrhagic anemia: Secondary | ICD-10-CM | POA: Diagnosis not present

## 2014-06-25 DIAGNOSIS — S72109A Unspecified trochanteric fracture of unspecified femur, initial encounter for closed fracture: Secondary | ICD-10-CM | POA: Diagnosis not present

## 2014-06-25 DIAGNOSIS — S72009A Fracture of unspecified part of neck of unspecified femur, initial encounter for closed fracture: Secondary | ICD-10-CM | POA: Diagnosis present

## 2014-06-25 DIAGNOSIS — R52 Pain, unspecified: Secondary | ICD-10-CM | POA: Diagnosis not present

## 2014-06-25 DIAGNOSIS — H269 Unspecified cataract: Secondary | ICD-10-CM | POA: Diagnosis present

## 2014-06-25 DIAGNOSIS — S0993XA Unspecified injury of face, initial encounter: Secondary | ICD-10-CM | POA: Diagnosis not present

## 2014-06-25 DIAGNOSIS — I10 Essential (primary) hypertension: Secondary | ICD-10-CM | POA: Diagnosis present

## 2014-06-25 DIAGNOSIS — Z888 Allergy status to other drugs, medicaments and biological substances status: Secondary | ICD-10-CM

## 2014-06-25 DIAGNOSIS — N898 Other specified noninflammatory disorders of vagina: Secondary | ICD-10-CM | POA: Diagnosis present

## 2014-06-25 DIAGNOSIS — E785 Hyperlipidemia, unspecified: Secondary | ICD-10-CM | POA: Diagnosis present

## 2014-06-25 DIAGNOSIS — E039 Hypothyroidism, unspecified: Secondary | ICD-10-CM | POA: Diagnosis not present

## 2014-06-25 DIAGNOSIS — R000 Tachycardia, unspecified: Secondary | ICD-10-CM | POA: Diagnosis present

## 2014-06-25 DIAGNOSIS — J449 Chronic obstructive pulmonary disease, unspecified: Secondary | ICD-10-CM | POA: Diagnosis present

## 2014-06-25 DIAGNOSIS — M1990 Unspecified osteoarthritis, unspecified site: Secondary | ICD-10-CM | POA: Diagnosis present

## 2014-06-25 DIAGNOSIS — S298XXA Other specified injuries of thorax, initial encounter: Secondary | ICD-10-CM | POA: Diagnosis not present

## 2014-06-25 DIAGNOSIS — R079 Chest pain, unspecified: Secondary | ICD-10-CM | POA: Diagnosis not present

## 2014-06-25 DIAGNOSIS — G252 Other specified forms of tremor: Secondary | ICD-10-CM

## 2014-06-25 DIAGNOSIS — S72009S Fracture of unspecified part of neck of unspecified femur, sequela: Secondary | ICD-10-CM | POA: Diagnosis not present

## 2014-06-25 DIAGNOSIS — N899 Noninflammatory disorder of vagina, unspecified: Secondary | ICD-10-CM | POA: Diagnosis present

## 2014-06-25 DIAGNOSIS — I369 Nonrheumatic tricuspid valve disorder, unspecified: Secondary | ICD-10-CM | POA: Diagnosis not present

## 2014-06-25 DIAGNOSIS — S199XXA Unspecified injury of neck, initial encounter: Secondary | ICD-10-CM | POA: Diagnosis not present

## 2014-06-25 DIAGNOSIS — Z8249 Family history of ischemic heart disease and other diseases of the circulatory system: Secondary | ICD-10-CM | POA: Diagnosis not present

## 2014-06-25 DIAGNOSIS — M549 Dorsalgia, unspecified: Secondary | ICD-10-CM | POA: Diagnosis not present

## 2014-06-25 DIAGNOSIS — G250 Essential tremor: Secondary | ICD-10-CM | POA: Diagnosis present

## 2014-06-25 DIAGNOSIS — M545 Low back pain, unspecified: Secondary | ICD-10-CM | POA: Diagnosis not present

## 2014-06-25 DIAGNOSIS — R229 Localized swelling, mass and lump, unspecified: Secondary | ICD-10-CM | POA: Diagnosis present

## 2014-06-25 DIAGNOSIS — S79919A Unspecified injury of unspecified hip, initial encounter: Secondary | ICD-10-CM | POA: Diagnosis not present

## 2014-06-25 DIAGNOSIS — S8990XA Unspecified injury of unspecified lower leg, initial encounter: Secondary | ICD-10-CM | POA: Diagnosis not present

## 2014-06-25 DIAGNOSIS — H9190 Unspecified hearing loss, unspecified ear: Secondary | ICD-10-CM | POA: Diagnosis present

## 2014-06-25 DIAGNOSIS — Z66 Do not resuscitate: Secondary | ICD-10-CM | POA: Diagnosis present

## 2014-06-25 DIAGNOSIS — H409 Unspecified glaucoma: Secondary | ICD-10-CM | POA: Diagnosis present

## 2014-06-25 DIAGNOSIS — G4700 Insomnia, unspecified: Secondary | ICD-10-CM | POA: Diagnosis not present

## 2014-06-25 DIAGNOSIS — I959 Hypotension, unspecified: Secondary | ICD-10-CM | POA: Diagnosis not present

## 2014-06-25 DIAGNOSIS — IMO0002 Reserved for concepts with insufficient information to code with codable children: Secondary | ICD-10-CM | POA: Diagnosis not present

## 2014-06-25 DIAGNOSIS — S99929A Unspecified injury of unspecified foot, initial encounter: Secondary | ICD-10-CM | POA: Diagnosis not present

## 2014-06-25 DIAGNOSIS — W010XXA Fall on same level from slipping, tripping and stumbling without subsequent striking against object, initial encounter: Secondary | ICD-10-CM | POA: Diagnosis present

## 2014-06-25 DIAGNOSIS — D649 Anemia, unspecified: Secondary | ICD-10-CM | POA: Diagnosis not present

## 2014-06-25 DIAGNOSIS — R55 Syncope and collapse: Secondary | ICD-10-CM | POA: Diagnosis not present

## 2014-06-25 DIAGNOSIS — J3089 Other allergic rhinitis: Secondary | ICD-10-CM | POA: Diagnosis not present

## 2014-06-25 DIAGNOSIS — T148XXA Other injury of unspecified body region, initial encounter: Secondary | ICD-10-CM | POA: Diagnosis not present

## 2014-06-25 DIAGNOSIS — S72109B Unspecified trochanteric fracture of unspecified femur, initial encounter for open fracture type I or II: Secondary | ICD-10-CM | POA: Diagnosis not present

## 2014-06-25 DIAGNOSIS — W19XXXA Unspecified fall, initial encounter: Secondary | ICD-10-CM | POA: Diagnosis not present

## 2014-06-25 DIAGNOSIS — R443 Hallucinations, unspecified: Secondary | ICD-10-CM | POA: Diagnosis not present

## 2014-06-25 DIAGNOSIS — Z5189 Encounter for other specified aftercare: Secondary | ICD-10-CM | POA: Diagnosis not present

## 2014-06-25 DIAGNOSIS — E034 Atrophy of thyroid (acquired): Secondary | ICD-10-CM | POA: Diagnosis present

## 2014-06-25 DIAGNOSIS — K5900 Constipation, unspecified: Secondary | ICD-10-CM | POA: Diagnosis not present

## 2014-06-25 HISTORY — DX: Fracture of unspecified part of neck of left femur, initial encounter for closed fracture: S72.002A

## 2014-06-25 LAB — BASIC METABOLIC PANEL
Anion gap: 9 (ref 5–15)
BUN: 21 mg/dL (ref 6–23)
CALCIUM: 9.6 mg/dL (ref 8.4–10.5)
CO2: 29 meq/L (ref 19–32)
Chloride: 103 mEq/L (ref 96–112)
Creatinine, Ser: 0.74 mg/dL (ref 0.50–1.10)
GFR calc Af Amer: 87 mL/min — ABNORMAL LOW (ref >90)
GFR calc non Af Amer: 75 mL/min — ABNORMAL LOW (ref >90)
GLUCOSE: 116 mg/dL — AB (ref 70–99)
POTASSIUM: 4.4 meq/L (ref 3.7–5.3)
Sodium: 141 mEq/L (ref 137–147)

## 2014-06-25 LAB — CBC WITH DIFFERENTIAL/PLATELET
Basophils Absolute: 0 10*3/uL (ref 0.0–0.1)
Basophils Relative: 0 % (ref 0–1)
EOS PCT: 0 % (ref 0–5)
Eosinophils Absolute: 0.1 10*3/uL (ref 0.0–0.7)
HCT: 34.6 % — ABNORMAL LOW (ref 36.0–46.0)
HEMOGLOBIN: 11.7 g/dL — AB (ref 12.0–15.0)
LYMPHS ABS: 0.7 10*3/uL (ref 0.7–4.0)
LYMPHS PCT: 6 % — AB (ref 12–46)
MCH: 31 pg (ref 26.0–34.0)
MCHC: 33.8 g/dL (ref 30.0–36.0)
MCV: 91.5 fL (ref 78.0–100.0)
Monocytes Absolute: 0.6 10*3/uL (ref 0.1–1.0)
Monocytes Relative: 5 % (ref 3–12)
Neutro Abs: 11 10*3/uL — ABNORMAL HIGH (ref 1.7–7.7)
Neutrophils Relative %: 89 % — ABNORMAL HIGH (ref 43–77)
Platelets: 170 10*3/uL (ref 150–400)
RBC: 3.78 MIL/uL — AB (ref 3.87–5.11)
RDW: 13.8 % (ref 11.5–15.5)
WBC: 12.3 10*3/uL — ABNORMAL HIGH (ref 4.0–10.5)

## 2014-06-25 LAB — URINALYSIS, ROUTINE W REFLEX MICROSCOPIC
Bilirubin Urine: NEGATIVE
GLUCOSE, UA: NEGATIVE mg/dL
Ketones, ur: NEGATIVE mg/dL
Nitrite: NEGATIVE
PH: 7 (ref 5.0–8.0)
Protein, ur: NEGATIVE mg/dL
Specific Gravity, Urine: 1.015 (ref 1.005–1.030)
Urobilinogen, UA: 0.2 mg/dL (ref 0.0–1.0)

## 2014-06-25 LAB — URINE MICROSCOPIC-ADD ON

## 2014-06-25 MED ORDER — FERROUS SULFATE 325 (65 FE) MG PO TABS
325.0000 mg | ORAL_TABLET | ORAL | Status: DC
  Administered 2014-06-28: 325 mg via ORAL
  Filled 2014-06-25 (×2): qty 1

## 2014-06-25 MED ORDER — LEVOTHYROXINE SODIUM 75 MCG PO TABS
75.0000 ug | ORAL_TABLET | Freq: Every day | ORAL | Status: DC
  Administered 2014-06-27 – 2014-06-29 (×3): 75 ug via ORAL
  Filled 2014-06-25 (×5): qty 1

## 2014-06-25 MED ORDER — SIMVASTATIN 10 MG PO TABS
10.0000 mg | ORAL_TABLET | Freq: Every day | ORAL | Status: DC
  Administered 2014-06-25 – 2014-06-28 (×4): 10 mg via ORAL
  Filled 2014-06-25 (×5): qty 1

## 2014-06-25 MED ORDER — CHLORHEXIDINE GLUCONATE 4 % EX LIQD
60.0000 mL | Freq: Once | CUTANEOUS | Status: DC
  Filled 2014-06-25: qty 60

## 2014-06-25 MED ORDER — SERTRALINE HCL 100 MG PO TABS
100.0000 mg | ORAL_TABLET | Freq: Every day | ORAL | Status: DC
  Administered 2014-06-25 – 2014-06-29 (×4): 100 mg via ORAL
  Filled 2014-06-25 (×5): qty 1

## 2014-06-25 MED ORDER — HYDROMORPHONE HCL PF 1 MG/ML IJ SOLN
0.5000 mg | Freq: Once | INTRAMUSCULAR | Status: AC
  Administered 2014-06-25: 0.5 mg via INTRAVENOUS
  Filled 2014-06-25: qty 1

## 2014-06-25 MED ORDER — ONDANSETRON HCL 4 MG/2ML IJ SOLN
4.0000 mg | Freq: Once | INTRAMUSCULAR | Status: AC
  Administered 2014-06-25: 4 mg via INTRAVENOUS
  Filled 2014-06-25: qty 2

## 2014-06-25 MED ORDER — ALBUTEROL SULFATE (2.5 MG/3ML) 0.083% IN NEBU: 2.0000 mL | INHALATION_SOLUTION | Freq: Four times a day (QID) | RESPIRATORY_TRACT | Status: DC | PRN

## 2014-06-25 MED ORDER — METHOCARBAMOL 500 MG PO TABS: 500.0000 mg | ORAL_TABLET | Freq: Four times a day (QID) | ORAL | Status: DC | PRN

## 2014-06-25 MED ORDER — SODIUM CHLORIDE 0.9 % IV SOLN: INTRAVENOUS | Status: DC

## 2014-06-25 MED ORDER — LATANOPROST 0.005 % OP SOLN
1.0000 [drp] | Freq: Every day | OPHTHALMIC | Status: DC
  Administered 2014-06-25 – 2014-06-28 (×4): 1 [drp] via OPHTHALMIC
  Filled 2014-06-25: qty 2.5

## 2014-06-25 MED ORDER — AMLODIPINE BESYLATE 5 MG PO TABS
5.0000 mg | ORAL_TABLET | Freq: Every day | ORAL | Status: DC
  Administered 2014-06-25: 5 mg via ORAL
  Filled 2014-06-25 (×3): qty 1

## 2014-06-25 MED ORDER — DICYCLOMINE HCL 10 MG PO CAPS
10.0000 mg | ORAL_CAPSULE | Freq: Every day | ORAL | Status: DC
  Administered 2014-06-25 – 2014-06-28 (×4): 10 mg via ORAL
  Filled 2014-06-25 (×5): qty 1

## 2014-06-25 MED ORDER — ICAPS LUTEIN & OMEGA-3 PO CAPS: 1.0000 | ORAL_CAPSULE | Freq: Every day | ORAL | Status: DC

## 2014-06-25 MED ORDER — HEPARIN SODIUM (PORCINE) 5000 UNIT/ML IJ SOLN: 5000.0000 [IU] | Freq: Three times a day (TID) | INTRAMUSCULAR | Status: DC

## 2014-06-25 MED ORDER — HYDROCODONE-ACETAMINOPHEN 5-325 MG PO TABS: 1.0000 | ORAL_TABLET | Freq: Four times a day (QID) | ORAL | Status: DC | PRN

## 2014-06-25 MED ORDER — LORATADINE 10 MG PO TABS
10.0000 mg | ORAL_TABLET | Freq: Every day | ORAL | Status: DC
  Administered 2014-06-25 – 2014-06-29 (×4): 10 mg via ORAL
  Filled 2014-06-25 (×5): qty 1

## 2014-06-25 MED ORDER — CALCIUM CARBONATE ANTACID 500 MG PO CHEW
1.0000 | CHEWABLE_TABLET | Freq: Every day | ORAL | Status: DC | PRN
  Filled 2014-06-25: qty 1

## 2014-06-25 MED ORDER — OCUVITE-LUTEIN PO CAPS
1.0000 | ORAL_CAPSULE | Freq: Every day | ORAL | Status: DC
  Administered 2014-06-25 – 2014-06-29 (×4): 1 via ORAL
  Filled 2014-06-25 (×5): qty 1

## 2014-06-25 MED ORDER — LORAZEPAM 0.5 MG PO TABS
0.5000 mg | ORAL_TABLET | Freq: Every evening | ORAL | Status: DC | PRN
  Administered 2014-06-27: 0.5 mg via ORAL
  Filled 2014-06-25: qty 1

## 2014-06-25 MED ORDER — QUETIAPINE FUMARATE 100 MG PO TABS
100.0000 mg | ORAL_TABLET | Freq: Every day | ORAL | Status: DC
  Administered 2014-06-25 – 2014-06-28 (×4): 100 mg via ORAL
  Filled 2014-06-25 (×5): qty 1

## 2014-06-25 MED ORDER — MORPHINE SULFATE 2 MG/ML IJ SOLN
1.0000 mg | INTRAMUSCULAR | Status: DC | PRN
  Administered 2014-06-25 (×2): 1 mg via INTRAVENOUS
  Filled 2014-06-25 (×2): qty 1

## 2014-06-25 MED ORDER — HYDROCORTISONE 2.5 % RE CREA: 1.0000 "application " | TOPICAL_CREAM | Freq: Every day | RECTAL | Status: DC | PRN

## 2014-06-25 MED ORDER — CALCIUM CARBONATE-VITAMIN D 500-200 MG-UNIT PO TABS
1.0000 | ORAL_TABLET | Freq: Every day | ORAL | Status: DC
  Administered 2014-06-25 – 2014-06-29 (×4): 1 via ORAL
  Filled 2014-06-25 (×5): qty 1

## 2014-06-25 MED ORDER — LISINOPRIL 40 MG PO TABS
40.0000 mg | ORAL_TABLET | Freq: Every day | ORAL | Status: DC
  Administered 2014-06-25: 40 mg via ORAL
  Filled 2014-06-25 (×3): qty 1

## 2014-06-25 MED ORDER — SODIUM CHLORIDE 0.9 % IV SOLN
INTRAVENOUS | Status: AC
  Administered 2014-06-25: 19:00:00 via INTRAVENOUS

## 2014-06-25 MED ORDER — METHOCARBAMOL 1000 MG/10ML IJ SOLN
500.0000 mg | Freq: Four times a day (QID) | INTRAVENOUS | Status: DC | PRN
  Filled 2014-06-25: qty 5

## 2014-06-25 MED ORDER — ACETAMINOPHEN ER 650 MG PO TBCR: 650.0000 mg | EXTENDED_RELEASE_TABLET | Freq: Three times a day (TID) | ORAL | Status: DC | PRN

## 2014-06-25 MED ORDER — CEFAZOLIN SODIUM-DEXTROSE 2-3 GM-% IV SOLR
2.0000 g | INTRAVENOUS | Status: DC
  Filled 2014-06-25: qty 50

## 2014-06-25 MED ORDER — VITAMIN B-12 100 MCG PO TABS
100.0000 ug | ORAL_TABLET | Freq: Every day | ORAL | Status: DC
  Administered 2014-06-27 – 2014-06-29 (×3): 100 ug via ORAL
  Filled 2014-06-25 (×4): qty 1

## 2014-06-25 MED ORDER — ACETAMINOPHEN 325 MG PO TABS: 650.0000 mg | ORAL_TABLET | Freq: Three times a day (TID) | ORAL | Status: DC | PRN

## 2014-06-25 NOTE — ED Provider Notes (Signed)
CSN: [id number]     Arrival date & time 06/25/14  1108 History   First MD Initiated Contact with Patient 06/25/14 1116     Chief Complaint  Patient presents with  . Fall  . Hip Pain     (Consider location/radiation/quality/duration/timing/severity/associated sxs/prior Treatment) Patient is a 80 y.o. female presenting with fall and hip pain.  Fall This is a chronic problem. The current episode started less than 1 hour ago. The problem has not changed since onset.Pertinent negatives include no chest pain, no abdominal pain, no headaches and no shortness of breath. Nothing aggravates the symptoms. Nothing relieves the symptoms.  Hip Pain This is a new problem. The problem occurs constantly. The problem has not changed since onset.Pertinent negatives include no chest pain, no abdominal pain, no headaches and no shortness of breath. Exacerbated by: movement, palpation.    Past Medical History  Diagnosis Date  . Abdominal pain, epigastric   . Impacted cerumen   . Abnormality of gait   . Anal fissure   . Loss of weight   . Hyposmolality and/or hyponatremia   . Pain in limb   . Other vitamin B12 deficiency anemia   . Chest pain, unspecified   . Anemia, unspecified   . Anxiety state, unspecified   . Abdominal pain, right upper quadrant   . Other pulmonary embolism and infarction   . Intestinal or peritoneal adhesions with obstruction (postoperative) (postinfection)   . Long term (current) use of anticoagulants   . Abdominal pain, right lower quadrant   . Essential and other specified forms of tremor   . Lumbago   . Unspecified hypothyroidism   . Major depressive disorder, single episode, unspecified   . Other and unspecified hyperlipidemia   . Macular degeneration (senile) of retina, unspecified   . Unspecified glaucoma   . Unspecified cataract   . Unspecified hearing loss   . Unspecified essential hypertension   . Osteoarthrosis, unspecified whether generalized or localized,  unspecified site   . Osteoporosis, unspecified   . Abnormal involuntary movements(781.0)   . Emphysema of lung    Past Surgical History  Procedure Laterality Date  . Excisional hemorrhoidectomy  1980  . Bladder suspension  1993  . Back surgery  2003  . Abdominal hysterectomy    . Trigger finger release  2009    Dr Teressa Senter  . Intramedullary (im) nail intertrochanteric Right 01/27/2014    Procedure: INTRAMEDULLARY (IM) NAIL INTERTROCHANTRIC;  Surgeon: Sheral Apley, MD;  Location: [facility] OR;  Service: Orthopedics;  Laterality: Right;   Family History  Problem Relation Age of Onset  . Congestive Heart Failure Mother   . Macular degeneration Mother   . Heart disease Mother   . Cancer Sister     lung cancer  . Arthritis Daughter   . Heart disease Daughter   . Glaucoma Sister   . Cataracts Sister    History  Substance Use Topics  . Smoking status: Former Smoker -- 40 years    Types: Cigarettes  . Smokeless tobacco: Not on file  . Alcohol Use: No   OB History   Grav Para Term Preterm Abortions TAB SAB Ect Mult Living                 Review of Systems  Constitutional: Negative for fever and chills.  HENT: Negative for congestion, rhinorrhea and sore throat.   Eyes: Negative for photophobia and visual disturbance.  Respiratory: Negative for cough and shortness of breath.   Cardiovascular:  Negative for chest pain and leg swelling.  Gastrointestinal: Negative for nausea, vomiting, abdominal pain, diarrhea and constipation.  Endocrine: Negative for polyphagia and polyuria.  Genitourinary: Negative for dysuria, flank pain, vaginal bleeding, vaginal discharge and enuresis.  Musculoskeletal: Negative for back pain and gait problem.  Skin: Negative for color change and rash.  Neurological: Negative for dizziness, syncope, light-headedness, numbness and headaches.  Hematological: Negative for adenopathy. Does not bruise/bleed easily.  All other systems reviewed and are  negative.     Allergies  Celebrex and Chlorzoxazone  Home Medications   Prior to Admission medications   Medication Sig Start Date End Date Taking? Authorizing Provider  acetaminophen (TYLENOL) 650 MG CR tablet Take 650 mg by mouth every 8 (eight) hours as needed for pain.   Yes Historical Provider, MD  albuterol (PROVENTIL HFA;VENTOLIN HFA) 108 (90 BASE) MCG/ACT inhaler Inhale 2 puffs into the lungs every 6 (six) hours as needed. Shortness of breath. 04/13/14  Yes Tiffany L Reed, DO  amLODipine (NORVASC) 5 MG tablet Take 1 tablet (5 mg total) by mouth daily. 04/13/14  Yes Tiffany L Reed, DO  calcium carbonate (TUMS - DOSED IN MG ELEMENTAL CALCIUM) 500 MG chewable tablet Chew 1 tablet by mouth daily as needed for indigestion or heartburn.   Yes Historical Provider, MD  calcium-vitamin D (OSCAL WITH D) 500-200 MG-UNIT per tablet Take 1 tablet by mouth daily.   Yes Historical Provider, MD  dicyclomine (BENTYL) 10 MG capsule Take 10 mg by mouth at bedtime.   Yes Historical Provider, MD  ferrous sulfate 325 (65 FE) MG tablet Take 325 mg by mouth every other day.   Yes Historical Provider, MD  fexofenadine (ALLEGRA) 180 MG tablet Take 180 mg by mouth daily as needed for allergies or rhinitis.   Yes Historical Provider, MD  hydrocortisone (ANUSOL-HC) 2.5 % rectal cream Place 1 application rectally daily as needed.   Yes Historical Provider, MD  latanoprost (XALATAN) 0.005 % ophthalmic solution Place 1 drop into both eyes at bedtime.   Yes Historical Provider, MD  levothyroxine (SYNTHROID, LEVOTHROID) 75 MCG tablet Take 1 tablet (75 mcg total) by mouth daily before breakfast. 04/13/14  Yes Tiffany L Reed, DO  lisinopril (PRINIVIL,ZESTRIL) 40 MG tablet Take 1 tablet (40 mg total) by mouth daily. 04/13/14  Yes Tiffany L Reed, DO  LORazepam (ATIVAN) 0.5 MG tablet Take 0.5 mg by mouth at bedtime as needed for anxiety.   Yes Historical Provider, MD  lovastatin (MEVACOR) 20 MG tablet Take 1 tablet (20 mg total)  by mouth every evening. 04/13/14  Yes Tiffany L Reed, DO  Multiple Vitamins-Minerals (ICAPS LUTEIN & OMEGA-3) CAPS Take 1 capsule by mouth daily.   Yes Historical Provider, MD  QUEtiapine (SEROQUEL) 100 MG tablet Take 100 mg by mouth at bedtime.   Yes Historical Provider, MD  sertraline (ZOLOFT) 100 MG tablet Take one tablet by mouth once daily 04/14/14  Yes Mahima Pandey, MD  vitamin B-12 (CYANOCOBALAMIN) 100 MCG tablet Take 100 mcg by mouth daily.   Yes Historical Provider, MD  enoxaparin (LOVENOX) 40 MG/0.4ML injection Inject 0.4 mLs (40 mg total) into the skin daily. 06/26/14   Naiping Glee ArvinMichael Xu, MD  fluconazole (DIFLUCAN) 150 MG tablet Take 1 tablet (150 mg total) by mouth once. Daily for 3 days 04/13/14   Kermit Baloiffany L Reed, DO  HYDROcodone-acetaminophen (NORCO) 5-325 MG per tablet Take 1-2 tablets by mouth every 6 (six) hours as needed. 06/26/14   Naiping Glee ArvinMichael Xu, MD   BP  141/47  Pulse 81  Temp(Src) 98.6 F (37 C) (Oral)  Resp 14  Ht 5\' 3"  (1.6 m)  Wt 109 lb (49.442 kg)  BMI 19.31 kg/m2  SpO2 96% Physical Exam  Vitals reviewed. Constitutional: She is oriented to person, place, and time. She appears well-developed and well-nourished.  HENT:  Head: Normocephalic and atraumatic.  Right Ear: External ear normal.  Left Ear: External ear normal.  Eyes: Conjunctivae and EOM are normal. Pupils are equal, round, and reactive to light.  Neck: Normal range of motion. Neck supple.  Cardiovascular: Normal rate, regular rhythm, normal heart sounds and intact distal pulses.   Pulmonary/Chest: Effort normal and breath sounds normal.  Abdominal: Soft. Bowel sounds are normal. There is no tenderness.  Musculoskeletal: Normal range of motion.       Left hip: She exhibits tenderness and bony tenderness.       Right ankle: She exhibits normal pulse.       Left ankle: She exhibits normal pulse.  Neurological: She is alert and oriented to person, place, and time.  Skin: Skin is warm and dry.    ED  Course  Procedures (including critical care time) Labs Review Labs Reviewed  CBC WITH DIFFERENTIAL - Abnormal; Notable for the following:    WBC 12.3 (*)    RBC 3.78 (*)    Hemoglobin 11.7 (*)    HCT 34.6 (*)    Neutrophils Relative % 89 (*)    Neutro Abs 11.0 (*)    Lymphocytes Relative 6 (*)    All other components within normal limits  BASIC METABOLIC PANEL - Abnormal; Notable for the following:    Glucose, Bld 116 (*)    GFR calc non Af Amer 75 (*)    GFR calc Af Amer 87 (*)    All other components within normal limits  URINALYSIS, ROUTINE W REFLEX MICROSCOPIC - Abnormal; Notable for the following:    APPearance CLOUDY (*)    Hgb urine dipstick TRACE (*)    Leukocytes, UA SMALL (*)    All other components within normal limits  URINE MICROSCOPIC-ADD ON - Abnormal; Notable for the following:    Bacteria, UA FEW (*)    All other components within normal limits  CBC - Abnormal; Notable for the following:    RBC 3.00 (*)    Hemoglobin 9.0 (*)    HCT 27.3 (*)    All other components within normal limits  BASIC METABOLIC PANEL - Abnormal; Notable for the following:    Glucose, Bld 115 (*)    GFR calc non Af Amer 63 (*)    GFR calc Af Amer 73 (*)    All other components within normal limits  SURGICAL PCR SCREEN  TYPE AND SCREEN  PREPARE RBC (CROSSMATCH)  ABO/RH    Imaging Review Dg Chest 1 View  06/25/2014   CLINICAL DATA:  Fall, pain  EXAM: CHEST - 1 VIEW  COMPARISON:  01/26/2014  FINDINGS: Lungs are hyperinflated compatible with COPD/ emphysema. Heart is enlarged. No superimposed CHF. Resolved right lower lobe pneumonia compared to 01/26/2014. No current airspace process, edema, collapse or consolidation. No large effusion or pneumothorax. Atherosclerosis of the aorta. Right lower lobe granulomatous disease suspected.  IMPRESSION: COPD/ emphysema.  Hyperinflation.  Resolved right lower lobe pneumonia.   Electronically Signed   By: Ruel Favors M.D.   On: 06/25/2014 12:29    Dg Lumbar Spine Complete  06/25/2014   CLINICAL DATA:  Fall, left hip pain  EXAM:  LUMBAR SPINE - COMPLETE 4+ VIEW  COMPARISON:  Concurrently obtained radiographs of the pelvis and left femur  FINDINGS: The bones are osteopenic. There is multilevel degenerative disease throughout the lumbar spine. Degenerative levoconvex scoliosis centered at L2. Atherosclerotic vascular calcifications are present throughout the aortoiliac system.  No acute fracture or malalignment.  IMPRESSION: 1. No acute fracture or malalignment. 2. Aortoiliac atherosclerosis. 3. Osteopenia. 4. Multilevel degenerative disc disease with degenerative levoconvex scoliosis centered at L2.   Electronically Signed   By: Malachy Moan M.D.   On: 06/25/2014 12:48   Dg Pelvis 1-2 Views  06/25/2014   CLINICAL DATA:  Fall, left hip pain  EXAM: PELVIS - 1-2 VIEW  COMPARISON:  Concurrently obtained radiographs of the left femur; Prior radiographs of the pelvis 01/27/2014  FINDINGS: Subtle disruption of the cortical surface in the mid aspect of the lesser trochanter and also along the intertrochanteric line. Although no definite fracture is identified, a nondisplaced intertrochanteric fracture is strongly suspected. The remainder of the visualized bony pelvis appears intact. Surgical changes of prior ORIF of a now healed right intertrochanteric fracture. Multilevel degenerative change in the lumbar spine. The bones are osteopenic. Atherosclerotic vascular calcifications.  IMPRESSION: 1. Strongly suspect nondisplaced left intertrochanteric fracture. If further imaging is warranted for confirmation, MRI of the pelvis is recommended given the patient's underlying osteopenia. 2. Surgical changes of prior ORIF of a now healed right intertrochanteric fracture. 3. Atherosclerotic vascular calcifications.   Electronically Signed   By: Malachy Moan M.D.   On: 06/25/2014 12:44   Dg Hip Operative Left  06/26/2014   CLINICAL DATA:  Left hip fracture.   EXAM: OPERATIVE LEFT HIP  COMPARISON:  Pelvis radiograph 06/25/2014  FINDINGS: Four intraoperative spot fluoroscopic images of the left femur are provided. These demonstrate interval internal fixation of the previously described intertrochanteric fracture with an anterograde intramedullary nail and 2 proximal screws. The lesser trochanter fracture is mildly displaced medially. The femoral head remains approximated with the acetabulum. Vascular calcification is noted.  IMPRESSION: Intraoperative images during internal fixation of intertrochanteric fracture of the left femur.   Electronically Signed   By: Sebastian Ache   On: 06/26/2014 09:27   Dg Femur Left  06/25/2014   CLINICAL DATA:  Fall  EXAM: LEFT FEMUR - 2 VIEW  COMPARISON:  Concurrently obtained radiographs of the pelvis  FINDINGS: On the lateral view, there is a lucency in the intertrochanteric region of the femur. On the frontal view, the lucency is not well seen. I strongly suspect a nondisplaced and nearly occult intertrochanteric fracture. No knee joint effusion. Atherosclerotic calcifications present throughout the common femoral, superficial femoral and popliteal arteries.  IMPRESSION: 1. Suspect nearly occult, nondisplaced intertrochanteric fracture. A lucency can be seen within the intertrochanteric region on the cross-table lateral view. Recommend further evaluation with MRI of the pelvis for confirmation. 2. Atherosclerotic vascular calcifications.   Electronically Signed   By: Malachy Moan M.D.   On: 06/25/2014 12:41   Ct Head Wo Contrast  06/25/2014   CLINICAL DATA:  Fall  EXAM: CT HEAD WITHOUT CONTRAST  TECHNIQUE: Contiguous axial images were obtained from the base of the skull through the vertex without intravenous contrast.  COMPARISON:  None.  FINDINGS: Negative for acute intracranial hemorrhage, acute infarction, mass, mass effect, hydrocephalus or midline shift. Gray-white differentiation is preserved throughout. Advanced,  confluent periventricular and subcortical white matter hypoattenuation which is nonspecific but most suggestive of the sequela of longstanding microvascular ischemic white matter disease. Remote lacunar  infarct versus dilated perivascular space in the right subinsular cortex. No focal scalp hematoma or calvarial fracture. Bilateral lens extractions. Normal aeration of the mastoid air cells and visualized paranasal sinuses. Atherosclerotic calcifications noted in both cavernous carotid arteries.  IMPRESSION: 1. No acute intracranial abnormality. 2. Advanced sequelae of chronic microvascular ischemic white matter disease. 3. Remote lacunar infarct versus dilated perivascular space in the right subinsular cortex. 4. Intracranial atherosclerosis.   Electronically Signed   By: Malachy Moan M.D.   On: 06/25/2014 12:51   Ct Hip Left Wo Contrast  06/25/2014   CLINICAL DATA:  Severe left lateral hip pain secondary to a fall today.  EXAM: CT OF THE LEFT HIP WITHOUT CONTRAST  TECHNIQUE: Multidetector CT imaging was performed according to the standard protocol. Multiplanar CT image reconstructions were also generated.  COMPARISON:  Radiographs dated 06/25/2014  FINDINGS: There is a comminuted intertrochanteric fracture of the proximal left femur with slight impaction. The greater and lesser trochanters are fragmented. Femoral head is intact. No dislocation.  Pelvic bones are intact. Severe degenerative changes in the lower lumbar spine. Intra medullary rod and compression screw in the right hip. The fracture has healed on the right.  IMPRESSION: Comminuted intertrochanteric fracture of the proximal left femur as described.   Electronically Signed   By: Geanie Cooley M.D.   On: 06/25/2014 15:08     EKG Interpretation None      MDM   Final diagnoses:  Hip fracture, left, closed, initial encounter   80 y.o. female  with pertinent PMH of emphysema, chronic falls presents with recurrent falls morning. The patient  is in her normal state of health, denies fever, nausea, vomiting, chest pain, shortness of breath, or cough. She has a history of falls and does not use her walker she's instructed. She is out of her for that today. Patient states that she was in her closet tripped and fell landing on her left side. Her primary complaint today is left hip pain. Per EMS report there concerned the patient shortening of her left leg. On arrival vital signs and physical exam as above, significant for left hip pain and tenderness with limited range of motion secondary to pain, no deformity this time pulses 2+ bilaterally, and no shortening noted on my exam. Will obtain x-rays and CT scan. No other signs of trauma on my exam.  Labs and imaging as above reviewed. XR with occult hip fracture of L sided.  CT scan obtained and confirmed.  Consulted orthopedics and hospitalist for admission.   1. Hip fracture, left, closed, initial encounter         Mirian Mo, MD 06/26/14 1003

## 2014-06-25 NOTE — ED Notes (Signed)
Pt here from home, pt fell this morning landing on her left hip , pt does have some shortening to that leg , pt recently broke the right hip

## 2014-06-25 NOTE — H&P (Signed)
[facility]                                                                                    Patient Demographics  Jane [name], is a 80 y.o. female  MRN: [id number]   DOB - 08/07/1925  Admit Date - 06/25/2014  Outpatient Primary MD for the patient is REED, TIFFANY, DO   With History of -  Past Medical History  Diagnosis Date  . Abdominal pain, epigastric   . Impacted cerumen   . Abnormality of gait   . Anal fissure   . Loss of weight   . Hyposmolality and/or hyponatremia   . Pain in limb   . Other vitamin B12 deficiency anemia   . Chest pain, unspecified   . Anemia, unspecified   . Anxiety state, unspecified   . Abdominal pain, right upper quadrant   . Other pulmonary embolism and infarction   . Intestinal or peritoneal adhesions with obstruction (postoperative) (postinfection)   . Long term (current) use of anticoagulants   . Abdominal pain, right lower quadrant   . Essential and other specified forms of tremor   . Lumbago   . Unspecified hypothyroidism   . Major depressive disorder, single episode, unspecified   . Other and unspecified hyperlipidemia   . Macular degeneration (senile) of retina, unspecified   . Unspecified glaucoma   . Unspecified cataract   . Unspecified hearing loss   . Unspecified essential hypertension   . Osteoarthrosis, unspecified whether generalized or localized, unspecified site   . Osteoporosis, unspecified   . Abnormal involuntary movements(781.0)   . Emphysema of lung       Past Surgical History  Procedure Laterality Date  . Excisional hemorrhoidectomy  1980  . Bladder suspension  1993  . Back surgery  2003  . Abdominal hysterectomy    . Trigger finger release  2009    Dr Teressa Senter  . Intramedullary (im) nail intertrochanteric Right 01/27/2014    Procedure: INTRAMEDULLARY (IM) NAIL INTERTROCHANTRIC;  Surgeon: Sheral Apley, MD;  Location: [facility] OR;  Service: Orthopedics;  Laterality: Right;    in for   Chief Complaint   Patient presents with  . Fall  . Hip Pain     HPI  Jane [name]  is a 80 y.o. female, with a PMH significant for osteoporosis, HTN, COPD and right sided hip fx in March of 2015 who currently resides in Independent Living at [facility].  She tells me she has been dizzy intermittenly, but she has been feeling well recently. On the morning of admission, the patient felt dizzy and had a mechanical fall onto the ground after which she was unable to get up.  She did not lose consciousness but rather laid on the floor and screamed until someone heard her.  She denies recent illness, SOB, Vomiting, Diarrhea, HA or anorexia.  She reports occasional burning with urination and a vaginal yeast infection that she has had for months.  Her daughter is at bedside and helps with the history.  Her mother has had no cardiac issues.  She is not on oxygen at home and was walking with a walker.  The patient  denies any chest discomfort, shortness breath, nausea, vomiting, diarrhea. She has a history two abdominal surgeries for bowel obstruction (in 2006, and 2009).  After one of the surgeries she was believed to have a PE but was never on anticoagulation for it.  In the ER the patient is found to have a non-displaced fracture of her left hip. The EDP consulted Dr. Roda Shutters of orthopedic surgery, who has requested a CT of her pelvis.    Review of Systems    In addition to the HPI above, No Fever-chills, No Headache, No changes with hearing. No problems swallowing food or Liquids, No Chest pain, Cough or Shortness of Breath, No Abdominal pain, No Nausea or Vomiting, Bowel movements are regular, No Blood in stool or Urine, No dysuria, No new skin rashes or bruises, No new joints pains-aches,  No new weakness, tingling, numbness in any extremity, No recent weight gain or loss, No polyuria, polydypsia or polyphagia, No significant Mental Stressors.  A full 10 point Review of Systems was done, except as stated  above, all other Review of Systems were negative.   Social History History  Substance Use Topics  . Smoking status: Former Smoker -- 40 years    Types: Cigarettes  . Smokeless tobacco: Not on file  . Alcohol Use: No     Family History Family History  Problem Relation Age of Onset  . Congestive Heart Failure Mother   . Macular degeneration Mother   . Heart disease Mother   . Cancer Sister     lung cancer  . Arthritis Daughter   . Heart disease Daughter   . Glaucoma Sister   . Cataracts Sister   Father died of cerebral hemorrhage.   Prior to Admission medications   Medication Sig Start Date End Date Taking? Authorizing Provider  acetaminophen (TYLENOL) 650 MG CR tablet Take 650 mg by mouth every 8 (eight) hours as needed for pain.   Yes Historical Provider, MD  albuterol (PROVENTIL HFA;VENTOLIN HFA) 108 (90 BASE) MCG/ACT inhaler Inhale 2 puffs into the lungs every 6 (six) hours as needed. Shortness of breath. 04/13/14  Yes Tiffany L Reed, DO  amLODipine (NORVASC) 5 MG tablet Take 1 tablet (5 mg total) by mouth daily. 04/13/14  Yes Tiffany L Reed, DO  calcium carbonate (TUMS - DOSED IN MG ELEMENTAL CALCIUM) 500 MG chewable tablet Chew 1 tablet by mouth daily as needed for indigestion or heartburn.   Yes Historical Provider, MD  calcium-vitamin D (OSCAL WITH D) 500-200 MG-UNIT per tablet Take 1 tablet by mouth daily.   Yes Historical Provider, MD  dicyclomine (BENTYL) 10 MG capsule Take 10 mg by mouth at bedtime.   Yes Historical Provider, MD  ferrous sulfate 325 (65 FE) MG tablet Take 325 mg by mouth every other day.   Yes Historical Provider, MD  fexofenadine (ALLEGRA) 180 MG tablet Take 180 mg by mouth daily as needed for allergies or rhinitis.   Yes Historical Provider, MD  hydrocortisone (ANUSOL-HC) 2.5 % rectal cream Place 1 application rectally daily as needed.   Yes Historical Provider, MD  latanoprost (XALATAN) 0.005 % ophthalmic solution Place 1 drop into both eyes at  bedtime.   Yes Historical Provider, MD  levothyroxine (SYNTHROID, LEVOTHROID) 75 MCG tablet Take 1 tablet (75 mcg total) by mouth daily before breakfast. 04/13/14  Yes Tiffany L Reed, DO  lisinopril (PRINIVIL,ZESTRIL) 40 MG tablet Take 1 tablet (40 mg total) by mouth daily. 04/13/14  Yes Tiffany L Reed, DO  LORazepam (  ATIVAN) 0.5 MG tablet Take 0.5 mg by mouth at bedtime as needed for anxiety.   Yes Historical Provider, MD  lovastatin (MEVACOR) 20 MG tablet Take 1 tablet (20 mg total) by mouth every evening. 04/13/14  Yes Tiffany L Reed, DO  Multiple Vitamins-Minerals (ICAPS LUTEIN & OMEGA-3) CAPS Take 1 capsule by mouth daily.   Yes Historical Provider, MD  QUEtiapine (SEROQUEL) 100 MG tablet Take 100 mg by mouth at bedtime.   Yes Historical Provider, MD  sertraline (ZOLOFT) 100 MG tablet Take one tablet by mouth once daily 04/14/14  Yes Mahima Pandey, MD  vitamin B-12 (CYANOCOBALAMIN) 100 MCG tablet Take 100 mcg by mouth daily.   Yes Historical Provider, MD  fluconazole (DIFLUCAN) 150 MG tablet Take 1 tablet (150 mg total) by mouth once. Daily for 3 days 04/13/14   Kermit Balo, DO    Allergies  Allergen Reactions  . Celebrex [Celecoxib]     unknown  . Chlorzoxazone     unknown    Physical Exam  Vitals  Blood pressure 162/66, pulse 69, temperature 98.1 F (36.7 C), temperature source Oral, resp. rate 18, SpO2 97.00%.   General:  lying in bed in NAD, appears elderly, frail and pleasant.  dtr at bedside.  Psych:  Normal affect and insight, Not Suicidal or Homicidal, Awake Alert, Oriented X 3.  Neuro:   No F.N deficits, ALL C.Nerves Intact, Strength 5/5 all 4 extremities, Sensation intact all 4 extremities.  ENT:  Ears and Eyes appear Normal, Conjunctivae clear, PERRLA. Moist Oral Mucosa.  Neck:  Supple Neck, No JVD, No cervical lymphadenopathy appreciated, No Carotid Bruits.  Respiratory:  Symmetrical Chest wall movement, Good air movement bilaterally, CTAB.  Cardiac: slightly  irregular (occassional PVC?), No Gallops, Rubs or Murmurs, No Parasternal Heave.  Abdomen:  ++ Positive Bowel Sounds, Abdomen Soft, Non tender, No organomegaly appreciated.  Well healed central scar.  Skin:  No Cyanosis, Normal Skin Turgor, No Skin Rash or Bruise.  Extremities:  Good muscle tone,  joints appear normal , no effusions, Normal ROM.   Data Review  CBC  Recent Labs Lab 06/25/14 1145  WBC 12.3*  HGB 11.7*  HCT 34.6*  PLT 170  MCV 91.5  MCH 31.0  MCHC 33.8  RDW 13.8  LYMPHSABS 0.7  MONOABS 0.6  EOSABS 0.1  BASOSABS 0.0   ------------------------------------------------------------------------------------------------------------------  Chemistries   Recent Labs Lab 06/25/14 1145  NA 141  K 4.4  CL 103  CO2 29  GLUCOSE 116*  BUN 21  CREATININE 0.74  CALCIUM 9.6    Urinalysis    Component Value Date/Time   COLORURINE YELLOW 06/25/2014 1259   APPEARANCEUR CLOUDY* 06/25/2014 1259   LABSPEC 1.015 06/25/2014 1259   PHURINE 7.0 06/25/2014 1259   GLUCOSEU NEGATIVE 06/25/2014 1259   HGBUR TRACE* 06/25/2014 1259   BILIRUBINUR NEGATIVE 06/25/2014 1259   KETONESUR NEGATIVE 06/25/2014 1259   PROTEINUR NEGATIVE 06/25/2014 1259   UROBILINOGEN 0.2 06/25/2014 1259   NITRITE NEGATIVE 06/25/2014 1259   LEUKOCYTESUR SMALL* 06/25/2014 1259    ----------------------------------------------------------------------------------------------------------------  Imaging results:   Dg Chest 1 View  06/25/2014   CLINICAL DATA:  Fall, pain  EXAM: CHEST - 1 VIEW  COMPARISON:  01/26/2014  FINDINGS: Lungs are hyperinflated compatible with COPD/ emphysema. Heart is enlarged. No superimposed CHF. Resolved right lower lobe pneumonia compared to 01/26/2014. No current airspace process, edema, collapse or consolidation. No large effusion or pneumothorax. Atherosclerosis of the aorta. Right lower lobe granulomatous disease suspected.  IMPRESSION: COPD/  emphysema.  Hyperinflation.  Resolved  right lower lobe pneumonia.   Electronically Signed   By: Ruel Favors M.D.   On: 06/25/2014 12:29   Dg Lumbar Spine Complete  06/25/2014   CLINICAL DATA:  Fall, left hip pain  EXAM: LUMBAR SPINE - COMPLETE 4+ VIEW  COMPARISON:  Concurrently obtained radiographs of the pelvis and left femur  FINDINGS: The bones are osteopenic. There is multilevel degenerative disease throughout the lumbar spine. Degenerative levoconvex scoliosis centered at L2. Atherosclerotic vascular calcifications are present throughout the aortoiliac system.  No acute fracture or malalignment.  IMPRESSION: 1. No acute fracture or malalignment. 2. Aortoiliac atherosclerosis. 3. Osteopenia. 4. Multilevel degenerative disc disease with degenerative levoconvex scoliosis centered at L2.   Electronically Signed   By: Malachy Moan M.D.   On: 06/25/2014 12:48   Dg Pelvis 1-2 Views  06/25/2014   CLINICAL DATA:  Fall, left hip pain  EXAM: PELVIS - 1-2 VIEW  COMPARISON:  Concurrently obtained radiographs of the left femur; Prior radiographs of the pelvis 01/27/2014  FINDINGS: Subtle disruption of the cortical surface in the mid aspect of the lesser trochanter and also along the intertrochanteric line. Although no definite fracture is identified, a nondisplaced intertrochanteric fracture is strongly suspected. The remainder of the visualized bony pelvis appears intact. Surgical changes of prior ORIF of a now healed right intertrochanteric fracture. Multilevel degenerative change in the lumbar spine. The bones are osteopenic. Atherosclerotic vascular calcifications.  IMPRESSION: 1. Strongly suspect nondisplaced left intertrochanteric fracture. If further imaging is warranted for confirmation, MRI of the pelvis is recommended given the patient's underlying osteopenia. 2. Surgical changes of prior ORIF of a now healed right intertrochanteric fracture. 3. Atherosclerotic vascular calcifications.   Electronically Signed   By: Malachy Moan M.D.    On: 06/25/2014 12:44   Dg Femur Left  06/25/2014   CLINICAL DATA:  Fall  EXAM: LEFT FEMUR - 2 VIEW  COMPARISON:  Concurrently obtained radiographs of the pelvis  FINDINGS: On the lateral view, there is a lucency in the intertrochanteric region of the femur. On the frontal view, the lucency is not well seen. I strongly suspect a nondisplaced and nearly occult intertrochanteric fracture. No knee joint effusion. Atherosclerotic calcifications present throughout the common femoral, superficial femoral and popliteal arteries.  IMPRESSION: 1. Suspect nearly occult, nondisplaced intertrochanteric fracture. A lucency can be seen within the intertrochanteric region on the cross-table lateral view. Recommend further evaluation with MRI of the pelvis for confirmation. 2. Atherosclerotic vascular calcifications.   Electronically Signed   By: Malachy Moan M.D.   On: 06/25/2014 12:41   Ct Head Wo Contrast  06/25/2014   CLINICAL DATA:  Fall  EXAM: CT HEAD WITHOUT CONTRAST  TECHNIQUE: Contiguous axial images were obtained from the base of the skull through the vertex without intravenous contrast.  COMPARISON:  None.  FINDINGS: Negative for acute intracranial hemorrhage, acute infarction, mass, mass effect, hydrocephalus or midline shift. Gray-white differentiation is preserved throughout. Advanced, confluent periventricular and subcortical white matter hypoattenuation which is nonspecific but most suggestive of the sequela of longstanding microvascular ischemic white matter disease. Remote lacunar infarct versus dilated perivascular space in the right subinsular cortex. No focal scalp hematoma or calvarial fracture. Bilateral lens extractions. Normal aeration of the mastoid air cells and visualized paranasal sinuses. Atherosclerotic calcifications noted in both cavernous carotid arteries.  IMPRESSION: 1. No acute intracranial abnormality. 2. Advanced sequelae of chronic microvascular ischemic white matter disease. 3.  Remote lacunar infarct versus dilated perivascular space  in the right subinsular cortex. 4. Intracranial atherosclerosis.   Electronically Signed   By: Malachy Moan M.D.   On: 06/25/2014 12:51   US Pelvis Limited  06/22/2014   CLINICAL DATA:  Tender right buttock mass.  EXAM: US PELVIS LIMITED  TECHNIQUE: Ultrasound examination of the pelvic soft tissues was performed in the area of clinical concern.  COMPARISON:  None.  FINDINGS: There is a poorly marginated 4.2 x 5.1 x 1.7 cm inhomogeneous soft tissue mass in the subcutaneous fat of the right buttock just to the right of the sacrum. There is some blood flow within this area. The appearance is not consistent with a benign lipoma.  IMPRESSION: Poorly defined soft tissue mass in the right buttock. The appearance is nonspecific. I suspect this is a chronic inflammatory process. There is a subtle area of abnormality in the overlying skin suggesting there may have been a prior infection at this site. The patient denies having a known prior sacral ulcer.  The patient also reports that the size of the lesion fluctuates.  This could be further assessed and better characterized with soft tissue MRI with and without contrast if the lesion enlarges or persists.   Electronically Signed   By: Geanie Cooley M.D.   On: 06/22/2014 15:09    My personal review of EKG: LVH.      Assessment & Plan  Principal Problem:   Closed left hip fracture Active Problems:   Essential hypertension, benign   Tachycardia   Hypothyroidism due to acquired atrophy of thyroid   Chronic obstructive asthma, unspecified   Vaginal irritation   Localized skin mass, lump, or swelling    Left Hip Fracture. Non displaced. Dr. Roda Shutters consulted.  CT Pelvis is pending. Management per orthopedics. Will order SQ Heparin for DVT Prophylaxis and Low dose morphine for pain. Patient appears to have muscle spasm - will order PRN robaxin.  HTN Continue home HTN regimen. Norvasc, lisinopril   May adjust after checking orthostatic vitals   Dizziness with Fall Patient reports she is "always dizzy" daughter reports this is inaccurate. Orthostatic vital signs. Gentle IVF. 2D Echo ordered. PT / OT ordered.  Hypothyroidism Continue synthroid  Hyperlipidemia  Continue statin.  Skin mass on buttocks Being worked up outpatient Thought to be from previously healed sacral pressure ulceration  Vaginal irritation/vaginitis. Hold Diflucan. Continue outpatient work up.  COPD Mild hypoxia on admission (this may be her norm) Placed on o2 N/C PRN Appears stable. Will continue prn albuterol.    DVT Prophylaxis Heparin   AM Labs Ordered, also please review Full Orders  Family Communication:   Daughter at bedside   Code Status:  DNR  Likely DC to  SNF - prefers [facility].  Condition:  Guarded  Time spent in minutes : 60    York, Tora Kindred PA-C on 06/25/2014 at 3:09 PM  Between 7am to 7pm - Pager - [phone]  After 7pm go to [email or web address] - password [email or web address]  And look for the night coverage person covering me after hours  [facility] Office  [phone]  Attending  Patient was seen, examined,treatment plan was discussed with the Physician extender. I have directly reviewed the clinical findings, lab, imaging studies and management of this patient in detail. I have made the necessary changes to the above noted documentation, and agree with the documentation, as recorded by the Physician extender.  Catarina Hartshorn, DO [phone]

## 2014-06-25 NOTE — Consult Note (Signed)
ORTHOPAEDIC CONSULTATION  REQUESTING PHYSICIAN: Orson Eva, MD  Chief Complaint: left hip fx  HPI: Jane [name] is a 80 y.o. female who complains of left hip fx s/p mechanical fall at home.  Lives independently at assisted living facility.  Had hip fx in march 2015 fixed by Dr. Percell Miller.  Was doing well and back to her old routine when this happened.  Walks with walker at baseline.  Denies syncope, LOC, neck pain, abd pain.  Ortho consulted for hip fx.  Past Medical History  Diagnosis Date  . Abdominal pain, epigastric   . Impacted cerumen   . Abnormality of gait   . Anal fissure   . Loss of weight   . Hyposmolality and/or hyponatremia   . Pain in limb   . Other vitamin B12 deficiency anemia   . Chest pain, unspecified   . Anemia, unspecified   . Anxiety state, unspecified   . Abdominal pain, right upper quadrant   . Other pulmonary embolism and infarction   . Intestinal or peritoneal adhesions with obstruction (postoperative) (postinfection)   . Long term (current) use of anticoagulants   . Abdominal pain, right lower quadrant   . Essential and other specified forms of tremor   . Lumbago   . Unspecified hypothyroidism   . Major depressive disorder, single episode, unspecified   . Other and unspecified hyperlipidemia   . Macular degeneration (senile) of retina, unspecified   . Unspecified glaucoma   . Unspecified cataract   . Unspecified hearing loss   . Unspecified essential hypertension   . Osteoarthrosis, unspecified whether generalized or localized, unspecified site   . Osteoporosis, unspecified   . Abnormal involuntary movements(781.0)   . Emphysema of lung    Past Surgical History  Procedure Laterality Date  . Excisional hemorrhoidectomy  1980  . Bladder suspension  1993  . Back surgery  2003  . Abdominal hysterectomy    . Trigger finger release  2009    Dr Daylene Katayama  . Intramedullary (im) nail intertrochanteric Right 01/27/2014    Procedure: INTRAMEDULLARY (IM)  NAIL INTERTROCHANTRIC;  Surgeon: Renette Butters, MD;  Location: [facility];  Service: Orthopedics;  Laterality: Right;   History   Social History  . Marital Status: Married    Spouse Name: N/A    Number of Children: N/A  . Years of Education: N/A   Social History Main Topics  . Smoking status: Former Smoker -- 40 years    Types: Cigarettes  . Smokeless tobacco: None  . Alcohol Use: No  . Drug Use: No  . Sexual Activity: None   Other Topics Concern  . None   Social History Narrative  . None   Family History  Problem Relation Age of Onset  . Congestive Heart Failure Mother   . Macular degeneration Mother   . Heart disease Mother   . Cancer Sister     lung cancer  . Arthritis Daughter   . Heart disease Daughter   . Glaucoma Sister   . Cataracts Sister    Allergies  Allergen Reactions  . Celebrex [Celecoxib]     unknown  . Chlorzoxazone     unknown   Prior to Admission medications   Medication Sig Start Date End Date Taking? Authorizing Provider  acetaminophen (TYLENOL) 650 MG CR tablet Take 650 mg by mouth every 8 (eight) hours as needed for pain.   Yes Historical Provider, MD  albuterol (PROVENTIL HFA;VENTOLIN HFA) 108 (90 BASE) MCG/ACT inhaler Inhale 2 puffs  into the lungs every 6 (six) hours as needed. Shortness of breath. 04/13/14  Yes Tiffany L Reed, DO  amLODipine (NORVASC) 5 MG tablet Take 1 tablet (5 mg total) by mouth daily. 04/13/14  Yes Tiffany L Reed, DO  calcium carbonate (TUMS - DOSED IN MG ELEMENTAL CALCIUM) 500 MG chewable tablet Chew 1 tablet by mouth daily as needed for indigestion or heartburn.   Yes Historical Provider, MD  calcium-vitamin D (OSCAL WITH D) 500-200 MG-UNIT per tablet Take 1 tablet by mouth daily.   Yes Historical Provider, MD  dicyclomine (BENTYL) 10 MG capsule Take 10 mg by mouth at bedtime.   Yes Historical Provider, MD  ferrous sulfate 325 (65 FE) MG tablet Take 325 mg by mouth every other day.   Yes Historical Provider, MD    fexofenadine (ALLEGRA) 180 MG tablet Take 180 mg by mouth daily as needed for allergies or rhinitis.   Yes Historical Provider, MD  hydrocortisone (ANUSOL-HC) 2.5 % rectal cream Place 1 application rectally daily as needed.   Yes Historical Provider, MD  latanoprost (XALATAN) 0.005 % ophthalmic solution Place 1 drop into both eyes at bedtime.   Yes Historical Provider, MD  levothyroxine (SYNTHROID, LEVOTHROID) 75 MCG tablet Take 1 tablet (75 mcg total) by mouth daily before breakfast. 04/13/14  Yes Tiffany L Reed, DO  lisinopril (PRINIVIL,ZESTRIL) 40 MG tablet Take 1 tablet (40 mg total) by mouth daily. 04/13/14  Yes Tiffany L Reed, DO  LORazepam (ATIVAN) 0.5 MG tablet Take 0.5 mg by mouth at bedtime as needed for anxiety.   Yes Historical Provider, MD  lovastatin (MEVACOR) 20 MG tablet Take 1 tablet (20 mg total) by mouth every evening. 04/13/14  Yes Tiffany L Reed, DO  Multiple Vitamins-Minerals (ICAPS LUTEIN & OMEGA-3) CAPS Take 1 capsule by mouth daily.   Yes Historical Provider, MD  QUEtiapine (SEROQUEL) 100 MG tablet Take 100 mg by mouth at bedtime.   Yes Historical Provider, MD  sertraline (ZOLOFT) 100 MG tablet Take one tablet by mouth once daily 04/14/14  Yes Mahima Pandey, MD  vitamin B-12 (CYANOCOBALAMIN) 100 MCG tablet Take 100 mcg by mouth daily.   Yes Historical Provider, MD  fluconazole (DIFLUCAN) 150 MG tablet Take 1 tablet (150 mg total) by mouth once. Daily for 3 days 04/13/14   Gayland Curry, DO   Dg Chest 1 View  06/25/2014   CLINICAL DATA:  Fall, pain  EXAM: CHEST - 1 VIEW  COMPARISON:  01/26/2014  FINDINGS: Lungs are hyperinflated compatible with COPD/ emphysema. Heart is enlarged. No superimposed CHF. Resolved right lower lobe pneumonia compared to 01/26/2014. No current airspace process, edema, collapse or consolidation. No large effusion or pneumothorax. Atherosclerosis of the aorta. Right lower lobe granulomatous disease suspected.  IMPRESSION: COPD/ emphysema.  Hyperinflation.   Resolved right lower lobe pneumonia.   Electronically Signed   By: Daryll Brod M.D.   On: 06/25/2014 12:29   Dg Lumbar Spine Complete  06/25/2014   CLINICAL DATA:  Fall, left hip pain  EXAM: LUMBAR SPINE - COMPLETE 4+ VIEW  COMPARISON:  Concurrently obtained radiographs of the pelvis and left femur  FINDINGS: The bones are osteopenic. There is multilevel degenerative disease throughout the lumbar spine. Degenerative levoconvex scoliosis centered at L2. Atherosclerotic vascular calcifications are present throughout the aortoiliac system.  No acute fracture or malalignment.  IMPRESSION: 1. No acute fracture or malalignment. 2. Aortoiliac atherosclerosis. 3. Osteopenia. 4. Multilevel degenerative disc disease with degenerative levoconvex scoliosis centered at L2.   Electronically  Signed   By: Jacqulynn Cadet M.D.   On: 06/25/2014 12:48   Dg Pelvis 1-2 Views  06/25/2014   CLINICAL DATA:  Fall, left hip pain  EXAM: PELVIS - 1-2 VIEW  COMPARISON:  Concurrently obtained radiographs of the left femur; Prior radiographs of the pelvis 01/27/2014  FINDINGS: Subtle disruption of the cortical surface in the mid aspect of the lesser trochanter and also along the intertrochanteric line. Although no definite fracture is identified, a nondisplaced intertrochanteric fracture is strongly suspected. The remainder of the visualized bony pelvis appears intact. Surgical changes of prior ORIF of a now healed right intertrochanteric fracture. Multilevel degenerative change in the lumbar spine. The bones are osteopenic. Atherosclerotic vascular calcifications.  IMPRESSION: 1. Strongly suspect nondisplaced left intertrochanteric fracture. If further imaging is warranted for confirmation, MRI of the pelvis is recommended given the patient's underlying osteopenia. 2. Surgical changes of prior ORIF of a now healed right intertrochanteric fracture. 3. Atherosclerotic vascular calcifications.   Electronically Signed   By: Jacqulynn Cadet M.D.   On: 06/25/2014 12:44   Dg Femur Left  06/25/2014   CLINICAL DATA:  Fall  EXAM: LEFT FEMUR - 2 VIEW  COMPARISON:  Concurrently obtained radiographs of the pelvis  FINDINGS: On the lateral view, there is a lucency in the intertrochanteric region of the femur. On the frontal view, the lucency is not well seen. I strongly suspect a nondisplaced and nearly occult intertrochanteric fracture. No knee joint effusion. Atherosclerotic calcifications present throughout the common femoral, superficial femoral and popliteal arteries.  IMPRESSION: 1. Suspect nearly occult, nondisplaced intertrochanteric fracture. A lucency can be seen within the intertrochanteric region on the cross-table lateral view. Recommend further evaluation with MRI of the pelvis for confirmation. 2. Atherosclerotic vascular calcifications.   Electronically Signed   By: Jacqulynn Cadet M.D.   On: 06/25/2014 12:41   Ct Head Wo Contrast  06/25/2014   CLINICAL DATA:  Fall  EXAM: CT HEAD WITHOUT CONTRAST  TECHNIQUE: Contiguous axial images were obtained from the base of the skull through the vertex without intravenous contrast.  COMPARISON:  None.  FINDINGS: Negative for acute intracranial hemorrhage, acute infarction, mass, mass effect, hydrocephalus or midline shift. Gray-white differentiation is preserved throughout. Advanced, confluent periventricular and subcortical white matter hypoattenuation which is nonspecific but most suggestive of the sequela of longstanding microvascular ischemic white matter disease. Remote lacunar infarct versus dilated perivascular space in the right subinsular cortex. No focal scalp hematoma or calvarial fracture. Bilateral lens extractions. Normal aeration of the mastoid air cells and visualized paranasal sinuses. Atherosclerotic calcifications noted in both cavernous carotid arteries.  IMPRESSION: 1. No acute intracranial abnormality. 2. Advanced sequelae of chronic microvascular ischemic white  matter disease. 3. Remote lacunar infarct versus dilated perivascular space in the right subinsular cortex. 4. Intracranial atherosclerosis.   Electronically Signed   By: Jacqulynn Cadet M.D.   On: 06/25/2014 12:51   Ct Hip Left Wo Contrast  06/25/2014   CLINICAL DATA:  Severe left lateral hip pain secondary to a fall today.  EXAM: CT OF THE LEFT HIP WITHOUT CONTRAST  TECHNIQUE: Multidetector CT imaging was performed according to the standard protocol. Multiplanar CT image reconstructions were also generated.  COMPARISON:  Radiographs dated 06/25/2014  FINDINGS: There is a comminuted intertrochanteric fracture of the proximal left femur with slight impaction. The greater and lesser trochanters are fragmented. Femoral head is intact. No dislocation.  Pelvic bones are intact. Severe degenerative changes in the lower lumbar spine. Intra medullary rod  and compression screw in the right hip. The fracture has healed on the right.  IMPRESSION: Comminuted intertrochanteric fracture of the proximal left femur as described.   Electronically Signed   By: Rozetta Nunnery M.D.   On: 06/25/2014 15:08    Positive ROS: All other systems have been reviewed and were otherwise negative with the exception of those mentioned in the HPI and as above.  Physical Exam: General: Alert, no acute distress Cardiovascular: No pedal edema Respiratory: No cyanosis, no use of accessory musculature GI: No organomegaly, abdomen is soft and non-tender Skin: No lesions in the area of chief complaint Neurologic: Sensation intact distally Psychiatric: Patient is competent for consent with normal mood and affect Lymphatic: No axillary or cervical lymphadenopathy  MUSCULOSKELETAL:  - very painful ROM of left hip - NVI LLE - skin intact  Assessment: Left intertrochanteric hip fx  Plan: - NPO after midnight - will plan for surgery in the am - discussed r/b/a with patient and daughter and they wish to proceed - consent signed  -  Based on history and fracture pattern this likely represents a fragility fracture. - Fragility fractures affect up to one half of women and one third of men after age 65 years and occur in the setting of bone disorder such as osteoporosis or osteopenia and warrant appropriate work-up. - The following are general recommendations that may serve as an outline for an appropriate work-up:  1.) Obtain bone density measurement to confirm presumptive diagnosis, assess severity of osteoporosis and risk of future fracture, and use as baseline for monitoring treatment  2.) Obtain laboratory tests: CBC, ESR, serum calcium, creatinine, albumin,phosphate, alkaline phosphatase, liver transaminases, protein electrophoresis, urinalysis, 25-hydroxyvitamin D.  3.) Exclude secondary causes of low bone mass and skeletal fragility (eg,multiple myeloma, lymphoma) as indicated.  4.) Obtain radiograph of thoracic and lumbar spine, particularly among individuals with back pain or height loss to assess presence of vertebral fractures  5.) Intermittent administration of recombinant human parathyroid hormone  6.) Optimize nutritional status using nutritional supplementation.  7.) Patient/family education to prevent future falls.  8.) Early mobilization and exercise program - exercise decreases the rate of bone loss and has been associated with decreased rate of fragility fractures   Thank you for the consult and the opportunity to see Ms. Culliton  N. Eduard Roux, MD [place] 5:12 PM

## 2014-06-25 NOTE — ED Notes (Signed)
Pt returned from radiology.

## 2014-06-25 NOTE — ED Notes (Signed)
Patient transported to X-ray 

## 2014-06-25 NOTE — ED Notes (Signed)
Pt returned from CT scan in extreme pain, EDP aware and will order another dose of pain medicine.

## 2014-06-25 NOTE — ED Notes (Signed)
Admitting doctor at bedside 

## 2014-06-26 ENCOUNTER — Inpatient Hospital Stay (HOSPITAL_COMMUNITY): Payer: Medicare Other

## 2014-06-26 ENCOUNTER — Encounter (HOSPITAL_COMMUNITY): Payer: Self-pay | Admitting: Certified Registered"

## 2014-06-26 ENCOUNTER — Encounter (HOSPITAL_COMMUNITY): Payer: Medicare Other | Admitting: Anesthesiology

## 2014-06-26 ENCOUNTER — Encounter (HOSPITAL_COMMUNITY)
Admission: EM | Disposition: A | Discharge status: Skilled Nursing Facility | Payer: Self-pay | Source: Home / Self Care | Attending: Internal Medicine

## 2014-06-26 ENCOUNTER — Inpatient Hospital Stay (HOSPITAL_COMMUNITY): Payer: Medicare Other | Admitting: Anesthesiology

## 2014-06-26 DIAGNOSIS — S72009A Fracture of unspecified part of neck of unspecified femur, initial encounter for closed fracture: Secondary | ICD-10-CM

## 2014-06-26 HISTORY — PX: INTRAMEDULLARY (IM) NAIL INTERTROCHANTERIC: SHX5875

## 2014-06-26 LAB — BASIC METABOLIC PANEL
Anion gap: 8 (ref 5–15)
BUN: 20 mg/dL (ref 6–23)
CALCIUM: 9 mg/dL (ref 8.4–10.5)
CO2: 29 meq/L (ref 19–32)
Chloride: 104 mEq/L (ref 96–112)
Creatinine, Ser: 0.82 mg/dL (ref 0.50–1.10)
GFR calc Af Amer: 73 mL/min — ABNORMAL LOW (ref >90)
GFR calc non Af Amer: 63 mL/min — ABNORMAL LOW (ref >90)
GLUCOSE: 115 mg/dL — AB (ref 70–99)
Potassium: 4.1 mEq/L (ref 3.7–5.3)
SODIUM: 141 meq/L (ref 137–147)

## 2014-06-26 LAB — CBC
HCT: 27.3 % — ABNORMAL LOW (ref 36.0–46.0)
HEMOGLOBIN: 9 g/dL — AB (ref 12.0–15.0)
MCH: 30 pg (ref 26.0–34.0)
MCHC: 33 g/dL (ref 30.0–36.0)
MCV: 91 fL (ref 78.0–100.0)
Platelets: 165 10*3/uL (ref 150–400)
RBC: 3 MIL/uL — AB (ref 3.87–5.11)
RDW: 14 % (ref 11.5–15.5)
WBC: 7.5 10*3/uL (ref 4.0–10.5)

## 2014-06-26 LAB — SURGICAL PCR SCREEN
MRSA, PCR: NEGATIVE
STAPHYLOCOCCUS AUREUS: NEGATIVE

## 2014-06-26 LAB — PREPARE RBC (CROSSMATCH)

## 2014-06-26 LAB — ABO/RH: ABO/RH(D): O POS

## 2014-06-26 SURGERY — FIXATION, FRACTURE, INTERTROCHANTERIC, WITH INTRAMEDULLARY ROD
Anesthesia: General | Site: Hip | Laterality: Left

## 2014-06-26 MED ORDER — MEPERIDINE HCL 25 MG/ML IJ SOLN: 6.2500 mg | INTRAMUSCULAR | Status: DC | PRN

## 2014-06-26 MED ORDER — NEOSTIGMINE METHYLSULFATE 10 MG/10ML IV SOLN
INTRAVENOUS | Status: AC
  Filled 2014-06-26: qty 1

## 2014-06-26 MED ORDER — METOCLOPRAMIDE HCL 5 MG/ML IJ SOLN: 5.0000 mg | Freq: Three times a day (TID) | INTRAMUSCULAR | Status: DC | PRN

## 2014-06-26 MED ORDER — MENTHOL 3 MG MT LOZG: 1.0000 | LOZENGE | OROMUCOSAL | Status: DC | PRN

## 2014-06-26 MED ORDER — ACETAMINOPHEN 325 MG PO TABS
650.0000 mg | ORAL_TABLET | Freq: Four times a day (QID) | ORAL | Status: DC | PRN
  Administered 2014-06-29: 650 mg via ORAL
  Filled 2014-06-26: qty 2

## 2014-06-26 MED ORDER — LACTATED RINGERS IV SOLN
INTRAVENOUS | Status: DC | PRN
  Administered 2014-06-26: 08:00:00 via INTRAVENOUS

## 2014-06-26 MED ORDER — ONDANSETRON HCL 4 MG/2ML IJ SOLN: 4.0000 mg | Freq: Four times a day (QID) | INTRAMUSCULAR | Status: DC | PRN

## 2014-06-26 MED ORDER — DEXAMETHASONE SODIUM PHOSPHATE 4 MG/ML IJ SOLN
INTRAMUSCULAR | Status: DC | PRN
  Administered 2014-06-26: 4 mg via INTRAVENOUS

## 2014-06-26 MED ORDER — FENTANYL CITRATE 0.05 MG/ML IJ SOLN
INTRAMUSCULAR | Status: AC
  Filled 2014-06-26: qty 2

## 2014-06-26 MED ORDER — ROCURONIUM BROMIDE 100 MG/10ML IV SOLN
INTRAVENOUS | Status: DC | PRN
  Administered 2014-06-26: 25 mg via INTRAVENOUS

## 2014-06-26 MED ORDER — HYDROCODONE-ACETAMINOPHEN 5-325 MG PO TABS: 1.0000 | ORAL_TABLET | Freq: Four times a day (QID) | ORAL | Status: DC | PRN

## 2014-06-26 MED ORDER — PHENYLEPHRINE HCL 10 MG/ML IJ SOLN
INTRAMUSCULAR | Status: DC | PRN
  Administered 2014-06-26: 80 ug via INTRAVENOUS
  Administered 2014-06-26: 120 ug via INTRAVENOUS
  Administered 2014-06-26: 80 ug via INTRAVENOUS
  Administered 2014-06-26: 40 ug via INTRAVENOUS
  Administered 2014-06-26: 80 ug via INTRAVENOUS

## 2014-06-26 MED ORDER — ONDANSETRON HCL 4 MG/2ML IJ SOLN
INTRAMUSCULAR | Status: AC
  Filled 2014-06-26: qty 2

## 2014-06-26 MED ORDER — PHENYLEPHRINE 40 MCG/ML (10ML) SYRINGE FOR IV PUSH (FOR BLOOD PRESSURE SUPPORT)
PREFILLED_SYRINGE | INTRAVENOUS | Status: AC
  Filled 2014-06-26: qty 10

## 2014-06-26 MED ORDER — PROPOFOL 10 MG/ML IV BOLUS
INTRAVENOUS | Status: DC | PRN
  Administered 2014-06-26: 90 mg via INTRAVENOUS

## 2014-06-26 MED ORDER — METOCLOPRAMIDE HCL 10 MG PO TABS: 5.0000 mg | ORAL_TABLET | Freq: Three times a day (TID) | ORAL | Status: DC | PRN

## 2014-06-26 MED ORDER — LIDOCAINE HCL (CARDIAC) 20 MG/ML IV SOLN
INTRAVENOUS | Status: DC | PRN
  Administered 2014-06-26: 40 mg via INTRAVENOUS

## 2014-06-26 MED ORDER — PHENOL 1.4 % MT LIQD: 1.0000 | OROMUCOSAL | Status: DC | PRN

## 2014-06-26 MED ORDER — NEOSTIGMINE METHYLSULFATE 10 MG/10ML IV SOLN
INTRAVENOUS | Status: DC | PRN
  Administered 2014-06-26: 3 mg via INTRAVENOUS

## 2014-06-26 MED ORDER — DEXTROSE 5 % IV SOLN
500.0000 mg | Freq: Four times a day (QID) | INTRAVENOUS | Status: DC | PRN
  Filled 2014-06-26: qty 5

## 2014-06-26 MED ORDER — MORPHINE SULFATE 2 MG/ML IJ SOLN: 0.5000 mg | INTRAMUSCULAR | Status: DC | PRN

## 2014-06-26 MED ORDER — PROMETHAZINE HCL 25 MG/ML IJ SOLN: 6.2500 mg | INTRAMUSCULAR | Status: DC | PRN

## 2014-06-26 MED ORDER — ONDANSETRON HCL 4 MG PO TABS: 4.0000 mg | ORAL_TABLET | Freq: Four times a day (QID) | ORAL | Status: DC | PRN

## 2014-06-26 MED ORDER — MAGNESIUM CITRATE PO SOLN: 1.0000 | Freq: Once | ORAL | Status: AC | PRN

## 2014-06-26 MED ORDER — ENOXAPARIN SODIUM 40 MG/0.4ML ~~LOC~~ SOLN
40.0000 mg | SUBCUTANEOUS | Status: DC
  Filled 2014-06-26: qty 0.4

## 2014-06-26 MED ORDER — METHOCARBAMOL 500 MG PO TABS
500.0000 mg | ORAL_TABLET | Freq: Four times a day (QID) | ORAL | Status: DC | PRN
  Administered 2014-06-28 – 2014-06-29 (×4): 500 mg via ORAL
  Filled 2014-06-26 (×4): qty 1

## 2014-06-26 MED ORDER — BOOST / RESOURCE BREEZE PO LIQD
1.0000 | Freq: Three times a day (TID) | ORAL | Status: DC
  Administered 2014-06-26 – 2014-06-29 (×4): 1 via ORAL

## 2014-06-26 MED ORDER — EPHEDRINE SULFATE 50 MG/ML IJ SOLN
INTRAMUSCULAR | Status: AC
  Filled 2014-06-26: qty 1

## 2014-06-26 MED ORDER — FENTANYL CITRATE 0.05 MG/ML IJ SOLN
INTRAMUSCULAR | Status: AC
  Filled 2014-06-26: qty 5

## 2014-06-26 MED ORDER — POLYETHYLENE GLYCOL 3350 17 G PO PACK: 17.0000 g | PACK | Freq: Every day | ORAL | Status: DC | PRN

## 2014-06-26 MED ORDER — ONDANSETRON HCL 4 MG/2ML IJ SOLN
INTRAMUSCULAR | Status: DC | PRN
  Administered 2014-06-26: 4 mg via INTRAVENOUS

## 2014-06-26 MED ORDER — ACETAMINOPHEN 650 MG RE SUPP: 650.0000 mg | Freq: Four times a day (QID) | RECTAL | Status: DC | PRN

## 2014-06-26 MED ORDER — CEFAZOLIN SODIUM-DEXTROSE 2-3 GM-% IV SOLR
2.0000 g | Freq: Four times a day (QID) | INTRAVENOUS | Status: AC
  Administered 2014-06-26 – 2014-06-27 (×3): 2 g via INTRAVENOUS
  Filled 2014-06-26 (×3): qty 50

## 2014-06-26 MED ORDER — HYDROCODONE-ACETAMINOPHEN 5-325 MG PO TABS
1.0000 | ORAL_TABLET | Freq: Four times a day (QID) | ORAL | Status: DC | PRN
  Administered 2014-06-26 (×2): 1 via ORAL
  Administered 2014-06-27: 2 via ORAL
  Administered 2014-06-27: 1 via ORAL
  Administered 2014-06-28: 2 via ORAL
  Administered 2014-06-28 – 2014-06-29 (×2): 1 via ORAL
  Filled 2014-06-26: qty 1
  Filled 2014-06-26: qty 2
  Filled 2014-06-26 (×3): qty 1
  Filled 2014-06-26: qty 2
  Filled 2014-06-26: qty 1

## 2014-06-26 MED ORDER — FENTANYL CITRATE 0.05 MG/ML IJ SOLN
25.0000 ug | INTRAMUSCULAR | Status: DC | PRN
  Administered 2014-06-26 (×5): 25 ug via INTRAVENOUS

## 2014-06-26 MED ORDER — SENNA 8.6 MG PO TABS
1.0000 | ORAL_TABLET | Freq: Two times a day (BID) | ORAL | Status: DC
  Administered 2014-06-26 – 2014-06-29 (×6): 8.6 mg via ORAL
  Filled 2014-06-26 (×8): qty 1

## 2014-06-26 MED ORDER — 0.9 % SODIUM CHLORIDE (POUR BTL) OPTIME
TOPICAL | Status: DC | PRN
  Administered 2014-06-26: 1000 mL

## 2014-06-26 MED ORDER — LIDOCAINE HCL (CARDIAC) 20 MG/ML IV SOLN
INTRAVENOUS | Status: AC
  Filled 2014-06-26: qty 5

## 2014-06-26 MED ORDER — ENOXAPARIN SODIUM 40 MG/0.4ML ~~LOC~~ SOLN: 40.0000 mg | Freq: Every day | SUBCUTANEOUS | Status: DC

## 2014-06-26 MED ORDER — ROCURONIUM BROMIDE 50 MG/5ML IV SOLN
INTRAVENOUS | Status: AC
  Filled 2014-06-26: qty 1

## 2014-06-26 MED ORDER — PHENYLEPHRINE HCL 10 MG/ML IJ SOLN
10.0000 mg | INTRAVENOUS | Status: DC | PRN
  Administered 2014-06-26: 30 ug/min via INTRAVENOUS

## 2014-06-26 MED ORDER — GLYCOPYRROLATE 0.2 MG/ML IJ SOLN
INTRAMUSCULAR | Status: DC | PRN
  Administered 2014-06-26: 0.4 mg via INTRAVENOUS

## 2014-06-26 MED ORDER — GLYCOPYRROLATE 0.2 MG/ML IJ SOLN
INTRAMUSCULAR | Status: AC
Start: 2014-06-26 — End: 2014-06-26
  Filled 2014-06-26: qty 2

## 2014-06-26 MED ORDER — DEXAMETHASONE SODIUM PHOSPHATE 4 MG/ML IJ SOLN
INTRAMUSCULAR | Status: AC
  Filled 2014-06-26: qty 1

## 2014-06-26 MED ORDER — SORBITOL 70 % SOLN: 30.0000 mL | Freq: Every day | Status: DC | PRN

## 2014-06-26 MED ORDER — FENTANYL CITRATE 0.05 MG/ML IJ SOLN
INTRAMUSCULAR | Status: DC | PRN
  Administered 2014-06-26 (×2): 50 ug via INTRAVENOUS

## 2014-06-26 MED ORDER — SUCCINYLCHOLINE CHLORIDE 20 MG/ML IJ SOLN
INTRAMUSCULAR | Status: AC
  Filled 2014-06-26: qty 1

## 2014-06-26 MED ORDER — OXYCODONE HCL 5 MG PO TABS
5.0000 mg | ORAL_TABLET | ORAL | Status: DC | PRN
  Administered 2014-06-27: 10 mg via ORAL
  Filled 2014-06-26: qty 2

## 2014-06-26 MED ORDER — SODIUM CHLORIDE 0.9 % IJ SOLN
INTRAMUSCULAR | Status: AC
  Filled 2014-06-26: qty 10

## 2014-06-26 MED ORDER — PROPOFOL 10 MG/ML IV BOLUS
INTRAVENOUS | Status: AC
  Filled 2014-06-26: qty 20

## 2014-06-26 MED ORDER — SODIUM CHLORIDE 0.9 % IV SOLN
INTRAVENOUS | Status: DC
  Administered 2014-06-27: via INTRAVENOUS

## 2014-06-26 SURGICAL SUPPLY — 48 items
BLADE SURG 15 STRL LF DISP TIS (BLADE) ×1 IMPLANT
BLADE SURG 15 STRL SS (BLADE) ×3
BNDG COHESIVE 4X5 TAN NS LF (GAUZE/BANDAGES/DRESSINGS) ×1 IMPLANT
BNDG GAUZE ELAST 4 BULKY (GAUZE/BANDAGES/DRESSINGS) ×1 IMPLANT
COVER PERINEAL POST (MISCELLANEOUS) ×3 IMPLANT
COVER SURGICAL LIGHT HANDLE (MISCELLANEOUS) ×3 IMPLANT
DRAPE PROXIMA HALF (DRAPES) IMPLANT
DRAPE STERI IOBAN 125X83 (DRAPES) ×3 IMPLANT
DRSG MEPILEX BORDER 4X4 (GAUZE/BANDAGES/DRESSINGS) ×2 IMPLANT
DRSG MEPILEX BORDER 4X8 (GAUZE/BANDAGES/DRESSINGS) ×3 IMPLANT
DRSG PAD ABDOMINAL 8X10 ST (GAUZE/BANDAGES/DRESSINGS) ×2 IMPLANT
DURAPREP 26ML APPLICATOR (WOUND CARE) ×3 IMPLANT
ELECT CAUTERY BLADE 6.4 (BLADE) ×3 IMPLANT
ELECT REM PT RETURN 9FT ADLT (ELECTROSURGICAL) ×3
ELECTRODE REM PT RTRN 9FT ADLT (ELECTROSURGICAL) ×1 IMPLANT
FACESHIELD WRAPAROUND (MASK) ×3 IMPLANT
FACESHIELD WRAPAROUND OR TEAM (MASK) ×1 IMPLANT
GAUZE XEROFORM 5X9 LF (GAUZE/BANDAGES/DRESSINGS) ×1 IMPLANT
GLOVE BIOGEL PI IND STRL 7.0 (GLOVE) IMPLANT
GLOVE BIOGEL PI INDICATOR 7.0 (GLOVE) ×4
GLOVE SURG SS PI 7.0 STRL IVOR (GLOVE) ×4 IMPLANT
GLOVE SURG SYN 7.5  E (GLOVE) ×4
GLOVE SURG SYN 7.5 E (GLOVE) ×2 IMPLANT
GLOVE SURG SYN 7.5 PF PI (GLOVE) ×2 IMPLANT
GOWN STRL REIN XL XLG (GOWN DISPOSABLE) ×3 IMPLANT
GOWN STRL REUS W/ TWL LRG LVL3 (GOWN DISPOSABLE) ×1 IMPLANT
GOWN STRL REUS W/TWL LRG LVL3 (GOWN DISPOSABLE) ×6
GUIDE PIN 3.2MM (MISCELLANEOUS) ×3
GUIDE PIN ORTH 343X3.2XBRAD (MISCELLANEOUS) IMPLANT
KIT BASIN OR (CUSTOM PROCEDURE TRAY) ×3 IMPLANT
KIT ROOM TURNOVER OR (KITS) ×3 IMPLANT
LINER BOOT UNIVERSAL DISP (MISCELLANEOUS) ×3 IMPLANT
MANIFOLD NEPTUNE II (INSTRUMENTS) ×1 IMPLANT
NAIL LEFT 11.5X36 (Nail) ×2 IMPLANT
NS IRRIG 1000ML POUR BTL (IV SOLUTION) ×3 IMPLANT
PACK GENERAL/GYN (CUSTOM PROCEDURE TRAY) ×3 IMPLANT
PAD ARMBOARD 7.5X6 YLW CONV (MISCELLANEOUS) ×6 IMPLANT
PAD CAST 4YDX4 CTTN HI CHSV (CAST SUPPLIES) ×2 IMPLANT
PADDING CAST COTTON 4X4 STRL (CAST SUPPLIES) ×6
SCREW LAG COMPR KIT 85/80 (Screw) ×2 IMPLANT
STAPLER VISISTAT 35W (STAPLE) ×3 IMPLANT
SUT VIC AB 0 CT1 27 (SUTURE) ×6
SUT VIC AB 0 CT1 27XBRD ANBCTR (SUTURE) ×2 IMPLANT
SUT VIC AB 2-0 CT1 27 (SUTURE) ×6
SUT VIC AB 2-0 CT1 TAPERPNT 27 (SUTURE) ×2 IMPLANT
TOWEL OR 17X24 6PK STRL BLUE (TOWEL DISPOSABLE) ×3 IMPLANT
TOWEL OR 17X26 10 PK STRL BLUE (TOWEL DISPOSABLE) ×3 IMPLANT
WATER STERILE IRR 1000ML POUR (IV SOLUTION) ×1 IMPLANT

## 2014-06-26 NOTE — Anesthesia Procedure Notes (Signed)
Procedure Name: Intubation Date/Time: 06/26/2014 8:00 AM Performed by: Jerilee Hoh Pre-anesthesia Checklist: Patient identified, Emergency Drugs available, Suction available and Patient being monitored Patient Re-evaluated:Patient Re-evaluated prior to inductionOxygen Delivery Method: Circle system utilized Preoxygenation: Pre-oxygenation with 100% oxygen Intubation Type: IV induction Ventilation: Mask ventilation without difficulty Laryngoscope Size: Mac and 3 Grade View: Grade I Tube type: Oral Tube size: 7.0 mm Number of attempts: 1 Airway Equipment and Method: Stylet Placement Confirmation: ETT inserted through vocal cords under direct vision,  positive ETCO2 and breath sounds checked- equal and bilateral Secured at: 21 cm Tube secured with: Tape Dental Injury: Teeth and Oropharynx as per pre-operative assessment

## 2014-06-26 NOTE — Progress Notes (Signed)
PT Cancellation Note  Patient Details Name: Taje Tondreau MRN: [id number] DOB: 1925/07/25   Cancelled Treatment:    Reason Eval/Treat Not Completed: Patient at procedure or test/unavailable.  Pt to OR today.  Will need new orders post-op.     Mecca Barga, Alison Murray 06/26/2014, 6:43 AM

## 2014-06-26 NOTE — Clinical Documentation Improvement (Signed)
Possible Clinical Conditions?   Expected Acute Blood Loss Anemia  Acute Blood Loss Anemia  Acute on chronic blood loss anemia  Chronic blood loss anemia  Precipitous drop in Hematocrit  Other Condition  Cannot Clinically Determine   Supporting Information: Pt had surgical repair of hip fx   Diagnostics: Component     Latest Ref Rng 06/25/2014 06/26/2014  WBC     4.0 - 10.5 K/uL 12.3 (H) 7.5  RBC     3.87 - 5.11 MIL/uL 3.78 (L) 3.00 (L)  Hemoglobin     12.0 - 15.0 g/dL 33.3 (L) 9.0 (L)  HCT     36.0 - 46.0 % 34.6 (L) 27.3 (L)  RDW     11.5 - 15.5 % 13.8 14.0  Platelets     150 - 400 K/uL 170 165   Thank You, Nevin Bloodgood, RN, BSN, CCDS,Clinical Documentation Specialist:  [phone]  [phone]=[facility] [place]- Health Information Management

## 2014-06-26 NOTE — Progress Notes (Signed)
Utilization review completed.  

## 2014-06-26 NOTE — Anesthesia Postprocedure Evaluation (Signed)
  Anesthesia Post-op Note  Patient: Jane [name]  Procedure(s) Performed: Procedure(s): LEFT HIP INTRAMEDULLARY (IM) NAIL (Left)  Patient Location: PACU  Anesthesia Type:General  Level of Consciousness: alert , oriented and sedated  Airway and Oxygen Therapy: Patient Spontanous Breathing and Patient connected to nasal cannula oxygen  Post-op Pain: mild  Post-op Assessment: Post-op Vital signs reviewed, Patient's Cardiovascular Status Stable, Respiratory Function Stable, No signs of Nausea or vomiting and Pain level controlled  Post-op Vital Signs: Reviewed and stable  Last Vitals:  Filed Vitals:   06/26/14 0945  BP: 137/46  Pulse: 81  Temp:   Resp: 14    Complications: No apparent anesthesia complications

## 2014-06-26 NOTE — Care Management Note (Signed)
CARE MANAGEMENT NOTE 06/26/2014  Patient:  Jane [name], Jane [name]   Account Number:  [id number]  Date Initiated:  06/26/2014  Documentation initiated by:  Vance Peper  Subjective/Objective Assessment:   80 yr old female admitted s/p fall with left hip fracture. S/p left hip IM Nailing.  Patient is from Independent living facility.     Action/Plan:   PT/OT eval.  Patient will need shortterm rehab at SNF. Has been to [facility] and wants to go there now.Social worker is aware.   Anticipated DC Date:  06/27/2014   Anticipated DC Plan:  SKILLED NURSING FACILITY  In-house referral  Clinical Social Worker      DC Planning Services  CM consult      Choice offered to / List presented to:          [facility] arranged  NA      Status of service:  In process, will continue to follow Medicare Important Message given?   (If response is "NO", the following Medicare IM given date fields will be blank) Date Medicare IM given:   Medicare IM given by:   Date Additional Medicare IM given:   Additional Medicare IM given by:    Discharge Disposition:    Per UR Regulation:  Reviewed for med. necessity/level of care/duration of stay

## 2014-06-26 NOTE — Progress Notes (Signed)
INITIAL NUTRITION ASSESSMENT  DOCUMENTATION CODES Per approved criteria  -Not Applicable   INTERVENTION: Provide Resource Breeze po TID, each supplement provides 250 kcal and 9 grams of protein  Encourage PO intake.  NUTRITION DIAGNOSIS: Increased nutrient needs related to s/p surgery as evidenced by estimated nutrition needs.   Goal: Pt to meet >/= 90% of their estimated nutrition needs   Monitor:  PO intake, weight trends, labs, I/O's  Reason for Assessment: Consult for nutrition requirements/status  80 y.o. female  Admitting Dx: Closed left hip fracture  ASSESSMENT: PMH significant for osteoporosis, HTN, COPD and right sided hip fx in March of 2015 who currently resides in Independent Living at [facility]. On the morning of admission, the patient felt dizzy and had a mechanical fall onto the ground after which she was unable to get up. Pt is found to have a non-displaced fracture of her left hip.  PROCEDURE (8/21): Treatment of intertrochanteric fracture with intramedullary implant  Pt was a bit confused during time of visit. Pt reports she has a good appetite. Pt reports at home she has been eating well (3 full meals a day) with no difficulties. Pt reports her usual body weight is around 110 lbs, however per Epic records, pt has had a 7% weight loss in 5 months. Noticed pt without dentures. Pt was offered to have her diet down graded for easier consumption, but pt refused. Pt is willing to try [employer]. Will order.   Nutrition Focused Physical Exam:  Subcutaneous Fat:  Orbital Region: N/A Upper Arm Region: WNL Thoracic and Lumbar Region: N/A (unable to assess as pt just came back from surgery and was in pain- will reassess during next visit)  Muscle:  Temple Region: Moderate depletion Clavicle Bone Region: Severe depletion  Clavicle and Acromion Bone Region: Severe depletion Scapular Bone Region: N/A Dorsal Hand: Severe depletion Patellar Region: Severe  depletion Anterior Thigh Region: Severe depletion Posterior Calf Region: Severe depletion  Edema: none  Labs: High glucose (115 mg/dL). Low GFR.  Height: Ht Readings from Last 1 Encounters:  06/25/14 5\' 3"  (1.6 m)    Weight: Wt Readings from Last 1 Encounters:  06/25/14 109 lb (49.442 kg)    Ideal Body Weight: 115 lbs  % Ideal Body Weight: 95%  Wt Readings from Last 10 Encounters:  06/25/14 109 lb (49.442 kg)  06/25/14 109 lb (49.442 kg)  06/11/14 109 lb (49.442 kg)  04/13/14 108 lb (48.988 kg)  01/26/14 117 lb 15.1 oz (53.5 kg)  01/26/14 117 lb 15.1 oz (53.5 kg)  01/13/14 115 lb (52.164 kg)  11/24/13 116 lb (52.617 kg)  08/18/13 114 lb (51.71 kg)  06/19/13 110 lb 3.2 oz (49.986 kg)    Usual Body Weight: 110 lbs  % Usual Body Weight: 99%  BMI:  Body mass index is 19.31 kg/(m^2).  Estimated Nutritional Needs: Kcal: 1500-1700 Protein: 70-80 grams Fluid: 1.5 - 1.7 L/day  Skin: Incision left hip, stage I pressure ulcer on left buttocks  Diet Order: Clear Liquid  EDUCATION NEEDS: -No education needs identified at this time   Intake/Output Summary (Last 24 hours) at 06/26/14 1304 Last data filed at 06/26/14 0955  Gross per 24 hour  Intake   1925 ml  Output    775 ml  Net   1150 ml    Last BM: 8/19   Labs:   Recent Labs Lab 06/25/14 1145 06/26/14 0500  NA 141 141  K 4.4 4.1  CL 103 104  CO2 29  29  BUN 21 20  CREATININE 0.74 0.82  CALCIUM 9.6 9.0  GLUCOSE 116* 115*    CBG (last 3)  No results found for this basename: GLUCAP,  in the last 72 hours  Scheduled Meds: . amLODipine  5 mg Oral Daily  . calcium-vitamin D  1 tablet Oral Daily  .  ceFAZolin (ANCEF) IV  2 g Intravenous Q6H  . dicyclomine  10 mg Oral QHS  . [START ON 06/27/2014] enoxaparin (LOVENOX) injection  40 mg Subcutaneous Q24H  . fentaNYL      . fentaNYL      . ferrous sulfate  325 mg Oral QODAY  . latanoprost  1 drop Both Eyes QHS  . levothyroxine  75 mcg Oral QAC  breakfast  . lisinopril  40 mg Oral Daily  . loratadine  10 mg Oral Daily  . multivitamin-lutein  1 capsule Oral Daily  . QUEtiapine  100 mg Oral QHS  . senna  1 tablet Oral BID  . sertraline  100 mg Oral Daily  . simvastatin  10 mg Oral q1800  . vitamin B-12  100 mcg Oral Daily    Continuous Infusions: . sodium chloride 125 mL/hr at 06/26/14 1015    Past Medical History  Diagnosis Date  . Abdominal pain, epigastric   . Impacted cerumen   . Abnormality of gait   . Anal fissure   . Loss of weight   . Hyposmolality and/or hyponatremia   . Pain in limb   . Other vitamin B12 deficiency anemia   . Chest pain, unspecified   . Anemia, unspecified   . Anxiety state, unspecified   . Abdominal pain, right upper quadrant   . Other pulmonary embolism and infarction   . Intestinal or peritoneal adhesions with obstruction (postoperative) (postinfection)   . Long term (current) use of anticoagulants   . Abdominal pain, right lower quadrant   . Essential and other specified forms of tremor   . Lumbago   . Unspecified hypothyroidism   . Major depressive disorder, single episode, unspecified   . Other and unspecified hyperlipidemia   . Macular degeneration (senile) of retina, unspecified   . Unspecified glaucoma   . Unspecified cataract   . Unspecified hearing loss   . Unspecified essential hypertension   . Osteoarthrosis, unspecified whether generalized or localized, unspecified site   . Osteoporosis, unspecified   . Abnormal involuntary movements(781.0)   . Emphysema of lung     Past Surgical History  Procedure Laterality Date  . Excisional hemorrhoidectomy  1980  . Bladder suspension  1993  . Back surgery  2003  . Abdominal hysterectomy    . Trigger finger release  2009    Dr Teressa Senter  . Intramedullary (im) nail intertrochanteric Right 01/27/2014    Procedure: INTRAMEDULLARY (IM) NAIL INTERTROCHANTRIC;  Surgeon: Sheral Apley, MD;  Location: [facility] OR;  Service: Orthopedics;   Laterality: Right;    Marijean Niemann, MS, Provisional LDN Pager # [phone] After hours/ weekend pager # [phone]

## 2014-06-26 NOTE — Progress Notes (Signed)
Note and chart reviewed. Agree with assessment/intervention. Ian Malkin RD, LDN Inpatient Clinical Dietitian Pager: [phone] After Hours Pager: [phone]

## 2014-06-26 NOTE — Op Note (Signed)
   Date of Surgery: 06/26/2014  INDICATIONS: [name] is a 80 y.o.-year-old female who was involved in a mechanical fall and sustained a left hip fracture (intertrochanteric-type). The risks and benefits of the procedure discussed with the patient and family prior to the procedure and all questions were answered; consent was obtained.  PREOPERATIVE DIAGNOSIS: left hip fracture (intertrochanteric-type)   POSTOPERATIVE DIAGNOSIS: Same   PROCEDURE: Treatment of intertrochanteric fracture with intramedullary implant. CPT [phone]   SURGEON: N. Glee Arvin, M.D.   ANESTHESIA: general   IV FLUIDS AND URINE: See anesthesia record   ESTIMATED BLOOD LOSS: 200 cc  IMPLANTS: [employer] InterTAN 11.5 x 36   DRAINS: None.   COMPLICATIONS: None.   DESCRIPTION OF PROCEDURE: The patient was brought to the operating room and placed supine on the operating table. The patient's leg had been signed prior to the procedure. The patient had the anesthesia placed by the anesthesiologist. The prep verification and incision time-outs were performed to confirm that this was the correct patient, site, side and location. The patient had an SCD on the opposite lower extremity. The patient did receive antibiotics prior to the incision and was re-dosed during the procedure as needed at indicated intervals. The patient was positioned on the fracture table with the table in traction and internal rotation to reduce the hip. The well leg was placed in a hemi-lithotomy position and all bony prominences were well-padded. The patient had the lower extremity prepped and draped in the standard surgical fashion. The incision was made 4 finger breadths superior to the greater trochanter. A guide pin was inserted into the tip of the greater trochanter under fluoroscopic guidance. An opening reamer was used to gain access to the femoral canal. The nail length was measured and inserted down the femoral canal to its proper depth.  The appropriate version of insertion for the lag screw was found under fluoroscopy. A bone hook was used to maintain the reduction.  A pin was inserted up the femoral neck through the jig. Then, a second antirotation pin was inserted inferior to the first pin. The length of the lag screw was then measured. The lag screw was inserted as near to center-center in the head as possible. The antirotation pin was then taken out and an interdigitating compression screw was placed in its place. The leg was taken out of traction, then the interdigitating compression screw was used to compress across the fracture. Compression was visualized on serial xrays. The wound was copiously irrigated with saline and the subcutaneous layer closed with 2.0 vicryl and the skin was reapproximated with staples. The wounds were cleaned and dried a final time and a sterile dressing was placed. The hip was taken through a range of motion at the end of the case under fluoroscopic imaging to visualize the approach-withdraw phenomenon and confirm implant length in the head. The patient was then awakened from anesthesia and taken to the recovery room in stable condition. All counts were correct at the end of the case.   POSTOPERATIVE PLAN: The patient will be weight bearing as tolerated and will return in 2 weeks for staple removal and the patient will receive DVT prophylaxis based on other medications, activity level, and risk ratio of bleeding to thrombosis.   Jane Reel, MD [facility] [phone] 7:55 AM

## 2014-06-26 NOTE — Anesthesia Preprocedure Evaluation (Addendum)
Anesthesia Evaluation   Patient awake and Patient confused    Reviewed: Allergy & Precautions  Airway Mallampati: II      Dental   Pulmonary former smoker,  breath sounds clear to auscultation        Cardiovascular hypertension, Pt. on medications Rhythm:Regular Rate:Abnormal     Neuro/Psych    GI/Hepatic   Endo/Other    Renal/GU      Musculoskeletal   Abdominal (+)  Abdomen: soft.    Peds  Hematology   Anesthesia Other Findings   Reproductive/Obstetrics                          Anesthesia Physical Anesthesia Plan  ASA: III  Anesthesia Plan: General   Post-op Pain Management:    Induction: Intravenous  Airway Management Planned: Oral ETT  Additional Equipment:   Intra-op Plan:   Post-operative Plan: Extubation in OR  Informed Consent: I have reviewed the patients History and Physical, chart, labs and discussed the procedure including the risks, benefits and alternatives for the proposed anesthesia with the patient or authorized representative who has indicated his/her understanding and acceptance.     Plan Discussed with:   Anesthesia Plan Comments:         Anesthesia Quick Evaluation

## 2014-06-26 NOTE — Progress Notes (Signed)
[facility] PROGRESS NOTE  Jane [name] JOI:[id number] DOB: September 08, 1925 DOA: 06/25/2014 PCP: Bufford Spikes, DO  Assessment/Plan: Principal Problem:   Closed left hip fracture Active Problems:   Essential hypertension, benign   Hip fracture   Tachycardia   Hypothyroidism due to acquired atrophy of thyroid   Chronic obstructive asthma, unspecified   Vaginal irritation   Localized skin mass, lump, or swelling    Left Hip Fracture.  Non displaced. Status post surgery 8/21 Dr. Roda Shutters consulted. CT Pelvis shows Comminuted intertrochanteric fracture of the proximal left femur  Management per orthopedics.  Continue Lovenox for DVT prophylaxis  When necessary morphine, Robaxin, Vicodin for pain control Will need an outpatient bone density scan Check vitamin D. levels  Back pain As per orthopedic recommendations will obtain x-rays of the thoracic and lumbar spine  Anemia, acute blood loss anemia Transfuse if hemoglobin drops below 8.0 Recheck CBC daily  HTN  Continue home HTN regimen.  Norvasc, lisinopril  May adjust after checking orthostatic vitals    Dizziness with Fall  Patient reports she is "always dizzy" daughter reports this is inaccurate.  Orthostatic vital signs.  Gentle IVF.  2D Echo pending PT / OT ordered.   Hypothyroidism  Continue synthroid , TSH and 8/6 was 3.05  Hyperlipidemia  Continue statin.   Skin mass on buttocks  Being worked up outpatient  Thought to be from previously healed sacral pressure ulceration   Vaginal irritation/vaginitis.  Hold Diflucan.  Continue outpatient work up.   COPD  Mild hypoxia on admission (this may be her norm)  Placed on o2 N/C PRN  Appears stable.  Will continue prn albuterol.       Code Status: full Family Communication: family updated about patient's clinical progress Disposition Plan:  As above    Brief narrative: Jane [name] is a 80 y.o. female who complains of left hip fx s/p mechanical fall  at home. Lives independently at assisted living facility. Had hip fx in march 2015 fixed by Dr. Eulah Pont. Was doing well and back to her old routine when this happened. Walks with walker at baseline. Denies syncope, LOC, neck pain, abd pain. Ortho consulted for hip fx.   Consultants:  Orthopedics  Procedures: intertrochanteric fracture with intramedullary implant   Antibiotics:  None  HPI/Subjective: Patient seen postoperatively, appears to be in pain  Objective: Filed Vitals:   06/26/14 0930 06/26/14 0945 06/26/14 0955 06/26/14 1013  BP: 144/50 137/46 141/47 142/56  Pulse: 81 81  80  Temp:   98.6 F (37 C) 98.4 F (36.9 C)  TempSrc:    Oral  Resp: 11 14  15   Height:      Weight:      SpO2: 98% 96%  98%    Intake/Output Summary (Last 24 hours) at 06/26/14 1210 Last data filed at 06/26/14 0955  Gross per 24 hour  Intake   1925 ml  Output    775 ml  Net   1150 ml    Exam:  General: alert & oriented x 3 In NAD  Cardiovascular: RRR, nl S1 s2  Respiratory: Decreased breath sounds at the bases, scattered rhonchi, no crackles  Abdomen: soft +BS NT/ND, no masses palpable  Extremities: No cyanosis and no edema      Data Reviewed: Basic Metabolic Panel:  Recent Labs Lab 06/25/14 1145 06/26/14 0500  NA 141 141  K 4.4 4.1  CL 103 104  CO2 29 29  GLUCOSE 116* 115*  BUN 21 20  CREATININE 0.74  0.82  CALCIUM 9.6 9.0    Liver Function Tests: No results found for this basename: AST, ALT, ALKPHOS, BILITOT, PROT, ALBUMIN,  in the last 168 hours No results found for this basename: LIPASE, AMYLASE,  in the last 168 hours No results found for this basename: AMMONIA,  in the last 168 hours  CBC:  Recent Labs Lab 06/25/14 1145 06/26/14 0500  WBC 12.3* 7.5  NEUTROABS 11.0*  --   HGB 11.7* 9.0*  HCT 34.6* 27.3*  MCV 91.5 91.0  PLT 170 165    Cardiac Enzymes: No results found for this basename: CKTOTAL, CKMB, CKMBINDEX, TROPONINI,  in the last 168  hours BNP (last 3 results) No results found for this basename: PROBNP,  in the last 8760 hours   CBG: No results found for this basename: GLUCAP,  in the last 168 hours  Recent Results (from the past 240 hour(s))  SURGICAL PCR SCREEN     Status: None   Collection Time    06/26/14  5:15 AM      Result Value Ref Range Status   MRSA, PCR NEGATIVE  NEGATIVE Final   Staphylococcus aureus NEGATIVE  NEGATIVE Final   Comment:            The Xpert SA Assay (FDA     approved for NASAL specimens     in patients over 18 years of age),     is one component of     a comprehensive surveillance     program.  Test performance has     been validated by [employer] for patients greater     than or equal to 73 year old.     It is not intended     to diagnose infection nor to     guide or monitor treatment.     Studies: Dg Chest 1 View  06/25/2014   CLINICAL DATA:  Fall, pain  EXAM: CHEST - 1 VIEW  COMPARISON:  01/26/2014  FINDINGS: Lungs are hyperinflated compatible with COPD/ emphysema. Heart is enlarged. No superimposed CHF. Resolved right lower lobe pneumonia compared to 01/26/2014. No current airspace process, edema, collapse or consolidation. No large effusion or pneumothorax. Atherosclerosis of the aorta. Right lower lobe granulomatous disease suspected.  IMPRESSION: COPD/ emphysema.  Hyperinflation.  Resolved right lower lobe pneumonia.   Electronically Signed   By: Ruel Favors M.D.   On: 06/25/2014 12:29   Dg Lumbar Spine Complete  06/25/2014   CLINICAL DATA:  Fall, left hip pain  EXAM: LUMBAR SPINE - COMPLETE 4+ VIEW  COMPARISON:  Concurrently obtained radiographs of the pelvis and left femur  FINDINGS: The bones are osteopenic. There is multilevel degenerative disease throughout the lumbar spine. Degenerative levoconvex scoliosis centered at L2. Atherosclerotic vascular calcifications are present throughout the aortoiliac system.  No acute fracture or malalignment.  IMPRESSION: 1. No  acute fracture or malalignment. 2. Aortoiliac atherosclerosis. 3. Osteopenia. 4. Multilevel degenerative disc disease with degenerative levoconvex scoliosis centered at L2.   Electronically Signed   By: Malachy Moan M.D.   On: 06/25/2014 12:48   Dg Pelvis 1-2 Views  06/25/2014   CLINICAL DATA:  Fall, left hip pain  EXAM: PELVIS - 1-2 VIEW  COMPARISON:  Concurrently obtained radiographs of the left femur; Prior radiographs of the pelvis 01/27/2014  FINDINGS: Subtle disruption of the cortical surface in the mid aspect of the lesser trochanter and also along the intertrochanteric line. Although no definite fracture is identified,  a nondisplaced intertrochanteric fracture is strongly suspected. The remainder of the visualized bony pelvis appears intact. Surgical changes of prior ORIF of a now healed right intertrochanteric fracture. Multilevel degenerative change in the lumbar spine. The bones are osteopenic. Atherosclerotic vascular calcifications.  IMPRESSION: 1. Strongly suspect nondisplaced left intertrochanteric fracture. If further imaging is warranted for confirmation, MRI of the pelvis is recommended given the patient's underlying osteopenia. 2. Surgical changes of prior ORIF of a now healed right intertrochanteric fracture. 3. Atherosclerotic vascular calcifications.   Electronically Signed   By: Malachy MoanHeath  McCullough M.D.   On: 06/25/2014 12:44   Dg Hip Operative Left  06/26/2014   CLINICAL DATA:  Left hip fracture.  EXAM: OPERATIVE LEFT HIP  COMPARISON:  Pelvis radiograph 06/25/2014  FINDINGS: Four intraoperative spot fluoroscopic images of the left femur are provided. These demonstrate interval internal fixation of the previously described intertrochanteric fracture with an anterograde intramedullary nail and 2 proximal screws. The lesser trochanter fracture is mildly displaced medially. The femoral head remains approximated with the acetabulum. Vascular calcification is noted.  IMPRESSION:  Intraoperative images during internal fixation of intertrochanteric fracture of the left femur.   Electronically Signed   By: Sebastian AcheAllen  Grady   On: 06/26/2014 09:27   Dg Femur Left  06/25/2014   CLINICAL DATA:  Fall  EXAM: LEFT FEMUR - 2 VIEW  COMPARISON:  Concurrently obtained radiographs of the pelvis  FINDINGS: On the lateral view, there is a lucency in the intertrochanteric region of the femur. On the frontal view, the lucency is not well seen. I strongly suspect a nondisplaced and nearly occult intertrochanteric fracture. No knee joint effusion. Atherosclerotic calcifications present throughout the common femoral, superficial femoral and popliteal arteries.  IMPRESSION: 1. Suspect nearly occult, nondisplaced intertrochanteric fracture. A lucency can be seen within the intertrochanteric region on the cross-table lateral view. Recommend further evaluation with MRI of the pelvis for confirmation. 2. Atherosclerotic vascular calcifications.   Electronically Signed   By: Malachy MoanHeath  McCullough M.D.   On: 06/25/2014 12:41   Ct Head Wo Contrast  06/25/2014   CLINICAL DATA:  Fall  EXAM: CT HEAD WITHOUT CONTRAST  TECHNIQUE: Contiguous axial images were obtained from the base of the skull through the vertex without intravenous contrast.  COMPARISON:  None.  FINDINGS: Negative for acute intracranial hemorrhage, acute infarction, mass, mass effect, hydrocephalus or midline shift. Gray-white differentiation is preserved throughout. Advanced, confluent periventricular and subcortical white matter hypoattenuation which is nonspecific but most suggestive of the sequela of longstanding microvascular ischemic white matter disease. Remote lacunar infarct versus dilated perivascular space in the right subinsular cortex. No focal scalp hematoma or calvarial fracture. Bilateral lens extractions. Normal aeration of the mastoid air cells and visualized paranasal sinuses. Atherosclerotic calcifications noted in both cavernous carotid  arteries.  IMPRESSION: 1. No acute intracranial abnormality. 2. Advanced sequelae of chronic microvascular ischemic white matter disease. 3. Remote lacunar infarct versus dilated perivascular space in the right subinsular cortex. 4. Intracranial atherosclerosis.   Electronically Signed   By: Malachy MoanHeath  McCullough M.D.   On: 06/25/2014 12:51   [place] Pelvis Limited  06/22/2014   CLINICAL DATA:  Tender right buttock mass.  EXAM: US PELVIS LIMITED  TECHNIQUE: Ultrasound examination of the pelvic soft tissues was performed in the area of clinical concern.  COMPARISON:  None.  FINDINGS: There is a poorly marginated 4.2 x 5.1 x 1.7 cm inhomogeneous soft tissue mass in the subcutaneous fat of the right buttock just to the right of the sacrum.  There is some blood flow within this area. The appearance is not consistent with a benign lipoma.  IMPRESSION: Poorly defined soft tissue mass in the right buttock. The appearance is nonspecific. I suspect this is a chronic inflammatory process. There is a subtle area of abnormality in the overlying skin suggesting there may have been a prior infection at this site. The patient denies having a known prior sacral ulcer.  The patient also reports that the size of the lesion fluctuates.  This could be further assessed and better characterized with soft tissue MRI with and without contrast if the lesion enlarges or persists.   Electronically Signed   By: Geanie Cooley M.D.   On: 06/22/2014 15:09   Ct Hip Left Wo Contrast  06/25/2014   CLINICAL DATA:  Severe left lateral hip pain secondary to a fall today.  EXAM: CT OF THE LEFT HIP WITHOUT CONTRAST  TECHNIQUE: Multidetector CT imaging was performed according to the standard protocol. Multiplanar CT image reconstructions were also generated.  COMPARISON:  Radiographs dated 06/25/2014  FINDINGS: There is a comminuted intertrochanteric fracture of the proximal left femur with slight impaction. The greater and lesser trochanters are fragmented.  Femoral head is intact. No dislocation.  Pelvic bones are intact. Severe degenerative changes in the lower lumbar spine. Intra medullary rod and compression screw in the right hip. The fracture has healed on the right.  IMPRESSION: Comminuted intertrochanteric fracture of the proximal left femur as described.   Electronically Signed   By: Geanie Cooley M.D.   On: 06/25/2014 15:08    Scheduled Meds: . amLODipine  5 mg Oral Daily  . calcium-vitamin D  1 tablet Oral Daily  .  ceFAZolin (ANCEF) IV  2 g Intravenous Q6H  . dicyclomine  10 mg Oral QHS  . [START ON 06/27/2014] enoxaparin (LOVENOX) injection  40 mg Subcutaneous Q24H  . fentaNYL      . fentaNYL      . ferrous sulfate  325 mg Oral QODAY  . latanoprost  1 drop Both Eyes QHS  . levothyroxine  75 mcg Oral QAC breakfast  . lisinopril  40 mg Oral Daily  . loratadine  10 mg Oral Daily  . multivitamin-lutein  1 capsule Oral Daily  . QUEtiapine  100 mg Oral QHS  . senna  1 tablet Oral BID  . sertraline  100 mg Oral Daily  . simvastatin  10 mg Oral q1800  . vitamin B-12  100 mcg Oral Daily   Continuous Infusions: . sodium chloride 125 mL/hr at 06/26/14 1015    Principal Problem:   Closed left hip fracture Active Problems:   Essential hypertension, benign   Hip fracture   Tachycardia   Hypothyroidism due to acquired atrophy of thyroid   Chronic obstructive asthma, unspecified   Vaginal irritation   Localized skin mass, lump, or swelling    Time spent: 40 minutes   [facility]  [facility] Pager [phone]. If 7PM-7AM, please contact night-coverage at [email or web address], password [facility] 06/26/2014, 12:10 PM  LOS: 1 day

## 2014-06-26 NOTE — Transfer of Care (Signed)
Immediate Anesthesia Transfer of Care Note  Patient: Jane [name]  Procedure(s) Performed: Procedure(s): LEFT HIP INTRAMEDULLARY (IM) NAIL (Left)  Patient Location: PACU  Anesthesia Type:General  Level of Consciousness: awake, alert  and patient cooperative  Airway & Oxygen Therapy: Patient Spontanous Breathing and Patient connected to nasal cannula oxygen  Post-op Assessment: Report given to PACU RN, Post -op Vital signs reviewed and stable and Patient moving all extremities  Post vital signs: Reviewed and stable  Complications: No apparent anesthesia complications

## 2014-06-26 NOTE — Progress Notes (Signed)
OT Cancellation Note  Patient Details Name: Jane [name] MRN: [id number] DOB: [birth year]   Cancelled Treatment:    Reason Eval/Treat Not Completed: Patient at procedure or test/ unavailable.Pt to OR today. Will need new orders post-op.    Evette Georges [phone] 06/26/2014, 7:32 AM

## 2014-06-26 NOTE — Discharge Instructions (Signed)
1. Change dressings as needed 2. May get incision wet in 10 days 3. Take lovenox to prevent blood clots

## 2014-06-27 ENCOUNTER — Inpatient Hospital Stay (HOSPITAL_COMMUNITY): Payer: Medicare Other

## 2014-06-27 DIAGNOSIS — I369 Nonrheumatic tricuspid valve disorder, unspecified: Secondary | ICD-10-CM

## 2014-06-27 DIAGNOSIS — R55 Syncope and collapse: Secondary | ICD-10-CM

## 2014-06-27 LAB — COMPREHENSIVE METABOLIC PANEL
ALBUMIN: 2.6 g/dL — AB (ref 3.5–5.2)
ALK PHOS: 45 U/L (ref 39–117)
ALT: 6 U/L (ref 0–35)
AST: 13 U/L (ref 0–37)
Anion gap: 10 (ref 5–15)
BUN: 17 mg/dL (ref 6–23)
CALCIUM: 8 mg/dL — AB (ref 8.4–10.5)
CO2: 25 mEq/L (ref 19–32)
Chloride: 105 mEq/L (ref 96–112)
Creatinine, Ser: 0.79 mg/dL (ref 0.50–1.10)
GFR calc Af Amer: 85 mL/min — ABNORMAL LOW (ref >90)
GFR calc non Af Amer: 73 mL/min — ABNORMAL LOW (ref >90)
GLUCOSE: 109 mg/dL — AB (ref 70–99)
POTASSIUM: 4.1 meq/L (ref 3.7–5.3)
SODIUM: 140 meq/L (ref 137–147)
TOTAL PROTEIN: 4.7 g/dL — AB (ref 6.0–8.3)
Total Bilirubin: 0.3 mg/dL (ref 0.3–1.2)

## 2014-06-27 LAB — CBC
HCT: 19.2 % — ABNORMAL LOW (ref 36.0–46.0)
HEMATOCRIT: 18.9 % — AB (ref 36.0–46.0)
HEMOGLOBIN: 6.3 g/dL — AB (ref 12.0–15.0)
Hemoglobin: 6.3 g/dL — CL (ref 12.0–15.0)
MCH: 29.9 pg (ref 26.0–34.0)
MCH: 30.6 pg (ref 26.0–34.0)
MCHC: 32.8 g/dL (ref 30.0–36.0)
MCHC: 33.3 g/dL (ref 30.0–36.0)
MCV: 91 fL (ref 78.0–100.0)
MCV: 91.7 fL (ref 78.0–100.0)
PLATELETS: 121 10*3/uL — AB (ref 150–400)
Platelets: 99 10*3/uL — ABNORMAL LOW (ref 150–400)
RBC: 2.11 MIL/uL — ABNORMAL LOW (ref 3.87–5.11)
RBC: 2.06 MIL/uL — AB (ref 3.87–5.11)
RDW: 14.2 % (ref 11.5–15.5)
RDW: 14.1 % (ref 11.5–15.5)
WBC: 5.7 10*3/uL (ref 4.0–10.5)
WBC: 5.6 10*3/uL (ref 4.0–10.5)

## 2014-06-27 LAB — VITAMIN D 25 HYDROXY (VIT D DEFICIENCY, FRACTURES): VIT D 25 HYDROXY: 48 ng/mL (ref 30–89)

## 2014-06-27 MED ORDER — SODIUM CHLORIDE 0.9 % IV BOLUS (SEPSIS)
500.0000 mL | Freq: Once | INTRAVENOUS | Status: AC
  Administered 2014-06-27: 500 mL via INTRAVENOUS

## 2014-06-27 MED ORDER — FUROSEMIDE 10 MG/ML IJ SOLN
20.0000 mg | Freq: Once | INTRAMUSCULAR | Status: AC
  Administered 2014-06-27: 20 mg via INTRAVENOUS
  Filled 2014-06-27: qty 2

## 2014-06-27 MED ORDER — SODIUM CHLORIDE 0.9 % IV SOLN
INTRAVENOUS | Status: AC
  Administered 2014-06-27: 11:00:00 via INTRAVENOUS

## 2014-06-27 MED ORDER — SODIUM CHLORIDE 0.9 % IV SOLN
Freq: Once | INTRAVENOUS | Status: AC
  Administered 2014-06-27: 12:00:00 via INTRAVENOUS

## 2014-06-27 NOTE — Progress Notes (Signed)
PT Cancellation Note  Patient Details Name: Jane [name] MRN: [id number] DOB: Feb 18, 1925   Cancelled Treatment:    Reason Eval/Treat Not Completed: Patient not medically ready Patient not medically ready for physical therapy evaluation at this time. Hgb last recorded at 6.3 with blood pressure of 86/43.  Will follow up for evaluation at a later time when medically ready.  [address] [place], [place] [phone]   Berton Mount 06/27/2014, 11:52 AM

## 2014-06-27 NOTE — Progress Notes (Signed)
OT Cancellation Note  Patient Details Name: Marilyn Nihiser MRN: [id number] DOB: Mar 04, 1925   Cancelled Treatment:    Reason Eval/Treat Not Completed: Patient not medically ready. Patient not medically ready for occupational therapy evaluation at this time. Hgb last recorded at 6.3 with blood pressure of 86/43   Earlie Raveling OTR/L [phone] 06/27/2014, 12:03 PM

## 2014-06-27 NOTE — Progress Notes (Signed)
No s/s of reaction to 1st unit of PRBCs at this time.

## 2014-06-27 NOTE — Progress Notes (Signed)
Subjective: Pt stable - reports left leg pain   Objective: Vital signs in last 24 hours: Temp:  [98.1 F (36.7 C)-98.6 F (37 C)] 98.1 F (36.7 C) (08/22 0623) Pulse Rate:  [80-109] 85 (08/22 0623) Resp:  [11-16] 16 (08/22 0824) BP: (102-162)/(45-60) 125/52 mmHg (08/22 0623) SpO2:  [92 %-100 %] 99 % (08/22 0824)  Intake/Output from previous day: 08/21 0701 - 08/22 0700 In: 1768.8 [I.V.:1768.8] Out: 1125 [Urine:1050; Blood:75] Intake/Output this shift: Total I/O In: 150 [I.V.:150] Out: -   Exam:  Dorsiflexion/Plantar flexion intact  Labs:  Recent Labs  06/25/14 1145 06/26/14 0500 06/27/14 0655  HGB 11.7* 9.0* 6.3*    Recent Labs  06/26/14 0500 06/27/14 0655  WBC 7.5 5.7  RBC 3.00* 2.11*  HCT 27.3* 19.2*  PLT 165 121*    Recent Labs  06/26/14 0500 06/27/14 0655  NA 141 140  K 4.1 4.1  CL 104 105  CO2 29 25  BUN 20 17  CREATININE 0.82 0.79  GLUCOSE 115* 109*  CALCIUM 9.0 8.0*   No results found for this basename: LABPT, INR,  in the last 72 hours  Assessment/Plan: Plan to check hgb today - low on finger stick hgb per nurse - placement pending   DEAN,GREGORY SCOTT 06/27/2014, 9:08 AM

## 2014-06-27 NOTE — Progress Notes (Signed)
[facility] PROGRESS NOTE  Jane [name] IWP:[id number] DOB: 1924/12/05 DOA: 06/25/2014 PCP: Bufford Spikes, DO  Assessment/Plan: Principal Problem:   Closed left hip fracture Active Problems:   Essential hypertension, benign   Hip fracture   Tachycardia   Hypothyroidism due to acquired atrophy of thyroid   Chronic obstructive asthma, unspecified   Vaginal irritation   Localized skin mass, lump, or swelling     Left Hip Fracture.  Non displaced. Status post surgery 8/21  Dr. Roda Shutters consulted. CT Pelvis shows Comminuted intertrochanteric fracture of the proximal left femur  Management per orthopedics.  Discontinued Lovenox for DVT prophylaxis because of anemia, placed on SCDs When necessary morphine, Robaxin, Vicodin for pain control  Will need an outpatient bone density scan  Vitamin D. level is normal   Back pain  As per orthopedic recommendations will obtain x-rays of the thoracic and lumbar spine   Anemia, acute blood loss anemia  Significant drop in hemoglobin from 11.7-6.3 Repeating CBC Will need to transfuse 2 units of packed red blood cells if hemoglobin less than 7.0  Lovenox discontinued  HTN currently hypotensive Discontinued lisinopril and Norvasc Will give bolus normal saline 500 cc x1 Minimize narcotics   Dizziness with Fall  Patient reports she is "always dizzy" daughter reports this is inaccurate.  Orthostatic vital signs.  Gentle IVF.  2D Echo pending  PT / OT ordered.   Hypothyroidism  Continue synthroid , TSH and 8/6 was 3.05   Hyperlipidemia  Continue statin.   Skin mass on buttocks  Being worked up outpatient  Thought to be from previously healed sacral pressure ulceration   Vaginal irritation/vaginitis.  Hold Diflucan.  Continue outpatient work up.   COPD  Mild hypoxia on admission (this may be her norm)  Placed on o2 N/C PRN  Appears stable.  Will continue prn albuterol.    Code Status: full  Family Communication: family  updated about patient's clinical progress  Disposition Plan: As above   Brief narrative:  Jane [name] is a 80 y.o. female who complains of left hip fx s/p mechanical fall at home. Lives independently at assisted living facility. Had hip fx in march 2015 fixed by Dr. Eulah Pont. Was doing well and back to her old routine when this happened. Walks with walker at baseline. Denies syncope, LOC, neck pain, abd pain. Ortho consulted for hip fx.  Consultants:  Orthopedics Procedures:  intertrochanteric fracture with intramedullary implant  Antibiotics:  None     HPI/Subjective: Extremely hard of hearing, comfortable, hypotensive but asymptomatic  Objective: Filed Vitals:   06/27/14 0020 06/27/14 0623 06/27/14 0824 06/27/14 0957  BP: 136/54 125/52  86/43  Pulse: 86 85  93  Temp: 98.2 F (36.8 C) 98.1 F (36.7 C)    TempSrc: Oral     Resp: 16 16 16 18   Height:      Weight:      SpO2: 100% 100% 99% 98%    Intake/Output Summary (Last 24 hours) at 06/27/14 1021 Last data filed at 06/27/14 0857  Gross per 24 hour  Intake 1118.75 ml  Output    800 ml  Net 318.75 ml    Exam:  General: alert & oriented x 3 In NAD  Cardiovascular: RRR, nl S1 s2  Respiratory: Decreased breath sounds at the bases, scattered rhonchi, no crackles  Abdomen: soft +BS NT/ND, no masses palpable  Extremities: No cyanosis and no edema      Data Reviewed: Basic Metabolic Panel:  Recent Labs Lab 06/25/14 1145  06/26/14 0500 06/27/14 0655  NA 141 141 140  K 4.4 4.1 4.1  CL 103 104 105  CO2 29 29 25   GLUCOSE 116* 115* 109*  BUN 21 20 17   CREATININE 0.74 0.82 0.79  CALCIUM 9.6 9.0 8.0*    Liver Function Tests:  Recent Labs Lab 06/27/14 0655  AST 13  ALT 6  ALKPHOS 45  BILITOT 0.3  PROT 4.7*  ALBUMIN 2.6*   No results found for this basename: LIPASE, AMYLASE,  in the last 168 hours No results found for this basename: AMMONIA,  in the last 168 hours  CBC:  Recent Labs Lab  06/25/14 1145 06/26/14 0500 06/27/14 0655  WBC 12.3* 7.5 5.7  NEUTROABS 11.0*  --   --   HGB 11.7* 9.0* 6.3*  HCT 34.6* 27.3* 19.2*  MCV 91.5 91.0 91.0  PLT 170 165 121*    Cardiac Enzymes: No results found for this basename: CKTOTAL, CKMB, CKMBINDEX, TROPONINI,  in the last 168 hours BNP (last 3 results) No results found for this basename: PROBNP,  in the last 8760 hours   CBG: No results found for this basename: GLUCAP,  in the last 168 hours  Recent Results (from the past 240 hour(s))  SURGICAL PCR SCREEN     Status: None   Collection Time    06/26/14  5:15 AM      Result Value Ref Range Status   MRSA, PCR NEGATIVE  NEGATIVE Final   Staphylococcus aureus NEGATIVE  NEGATIVE Final   Comment:            The Xpert SA Assay (FDA     approved for NASAL specimens     in patients over 15 years of age),     is one component of     a comprehensive surveillance     program.  Test performance has     been validated by [employer] for patients greater     than or equal to 43 year old.     It is not intended     to diagnose infection nor to     guide or monitor treatment.     Studies: Dg Chest 1 View  06/25/2014   CLINICAL DATA:  Fall, pain  EXAM: CHEST - 1 VIEW  COMPARISON:  01/26/2014  FINDINGS: Lungs are hyperinflated compatible with COPD/ emphysema. Heart is enlarged. No superimposed CHF. Resolved right lower lobe pneumonia compared to 01/26/2014. No current airspace process, edema, collapse or consolidation. No large effusion or pneumothorax. Atherosclerosis of the aorta. Right lower lobe granulomatous disease suspected.  IMPRESSION: COPD/ emphysema.  Hyperinflation.  Resolved right lower lobe pneumonia.   Electronically Signed   By: Ruel Favors M.D.   On: 06/25/2014 12:29   Dg Lumbar Spine Complete  06/25/2014   CLINICAL DATA:  Fall, left hip pain  EXAM: LUMBAR SPINE - COMPLETE 4+ VIEW  COMPARISON:  Concurrently obtained radiographs of the pelvis and left femur   FINDINGS: The bones are osteopenic. There is multilevel degenerative disease throughout the lumbar spine. Degenerative levoconvex scoliosis centered at L2. Atherosclerotic vascular calcifications are present throughout the aortoiliac system.  No acute fracture or malalignment.  IMPRESSION: 1. No acute fracture or malalignment. 2. Aortoiliac atherosclerosis. 3. Osteopenia. 4. Multilevel degenerative disc disease with degenerative levoconvex scoliosis centered at L2.   Electronically Signed   By: Malachy Moan M.D.   On: 06/25/2014 12:48   Dg Pelvis 1-2 Views  06/25/2014  CLINICAL DATA:  Fall, left hip pain  EXAM: PELVIS - 1-2 VIEW  COMPARISON:  Concurrently obtained radiographs of the left femur; Prior radiographs of the pelvis 01/27/2014  FINDINGS: Subtle disruption of the cortical surface in the mid aspect of the lesser trochanter and also along the intertrochanteric line. Although no definite fracture is identified, a nondisplaced intertrochanteric fracture is strongly suspected. The remainder of the visualized bony pelvis appears intact. Surgical changes of prior ORIF of a now healed right intertrochanteric fracture. Multilevel degenerative change in the lumbar spine. The bones are osteopenic. Atherosclerotic vascular calcifications.  IMPRESSION: 1. Strongly suspect nondisplaced left intertrochanteric fracture. If further imaging is warranted for confirmation, MRI of the pelvis is recommended given the patient's underlying osteopenia. 2. Surgical changes of prior ORIF of a now healed right intertrochanteric fracture. 3. Atherosclerotic vascular calcifications.   Electronically Signed   By: Malachy Moan M.D.   On: 06/25/2014 12:44   Dg Hip Operative Left  06/26/2014   CLINICAL DATA:  Left hip fracture.  EXAM: OPERATIVE LEFT HIP  COMPARISON:  Pelvis radiograph 06/25/2014  FINDINGS: Four intraoperative spot fluoroscopic images of the left femur are provided. These demonstrate interval internal  fixation of the previously described intertrochanteric fracture with an anterograde intramedullary nail and 2 proximal screws. The lesser trochanter fracture is mildly displaced medially. The femoral head remains approximated with the acetabulum. Vascular calcification is noted.  IMPRESSION: Intraoperative images during internal fixation of intertrochanteric fracture of the left femur.   Electronically Signed   By: Sebastian Ache   On: 06/26/2014 09:27   Dg Femur Left  06/25/2014   CLINICAL DATA:  Fall  EXAM: LEFT FEMUR - 2 VIEW  COMPARISON:  Concurrently obtained radiographs of the pelvis  FINDINGS: On the lateral view, there is a lucency in the intertrochanteric region of the femur. On the frontal view, the lucency is not well seen. I strongly suspect a nondisplaced and nearly occult intertrochanteric fracture. No knee joint effusion. Atherosclerotic calcifications present throughout the common femoral, superficial femoral and popliteal arteries.  IMPRESSION: 1. Suspect nearly occult, nondisplaced intertrochanteric fracture. A lucency can be seen within the intertrochanteric region on the cross-table lateral view. Recommend further evaluation with MRI of the pelvis for confirmation. 2. Atherosclerotic vascular calcifications.   Electronically Signed   By: Malachy Moan M.D.   On: 06/25/2014 12:41   Ct Head Wo Contrast  06/25/2014   CLINICAL DATA:  Fall  EXAM: CT HEAD WITHOUT CONTRAST  TECHNIQUE: Contiguous axial images were obtained from the base of the skull through the vertex without intravenous contrast.  COMPARISON:  None.  FINDINGS: Negative for acute intracranial hemorrhage, acute infarction, mass, mass effect, hydrocephalus or midline shift. Gray-white differentiation is preserved throughout. Advanced, confluent periventricular and subcortical white matter hypoattenuation which is nonspecific but most suggestive of the sequela of longstanding microvascular ischemic white matter disease. Remote  lacunar infarct versus dilated perivascular space in the right subinsular cortex. No focal scalp hematoma or calvarial fracture. Bilateral lens extractions. Normal aeration of the mastoid air cells and visualized paranasal sinuses. Atherosclerotic calcifications noted in both cavernous carotid arteries.  IMPRESSION: 1. No acute intracranial abnormality. 2. Advanced sequelae of chronic microvascular ischemic white matter disease. 3. Remote lacunar infarct versus dilated perivascular space in the right subinsular cortex. 4. Intracranial atherosclerosis.   Electronically Signed   By: Malachy Moan M.D.   On: 06/25/2014 12:51   US Pelvis Limited  06/22/2014   CLINICAL DATA:  Tender right buttock mass.  EXAM: US PELVIS LIMITED  TECHNIQUE: Ultrasound examination of the pelvic soft tissues was performed in the area of clinical concern.  COMPARISON:  None.  FINDINGS: There is a poorly marginated 4.2 x 5.1 x 1.7 cm inhomogeneous soft tissue mass in the subcutaneous fat of the right buttock just to the right of the sacrum. There is some blood flow within this area. The appearance is not consistent with a benign lipoma.  IMPRESSION: Poorly defined soft tissue mass in the right buttock. The appearance is nonspecific. I suspect this is a chronic inflammatory process. There is a subtle area of abnormality in the overlying skin suggesting there may have been a prior infection at this site. The patient denies having a known prior sacral ulcer.  The patient also reports that the size of the lesion fluctuates.  This could be further assessed and better characterized with soft tissue MRI with and without contrast if the lesion enlarges or persists.   Electronically Signed   By: Geanie Cooley M.D.   On: 06/22/2014 15:09   Ct Hip Left Wo Contrast  06/25/2014   CLINICAL DATA:  Severe left lateral hip pain secondary to a fall today.  EXAM: CT OF THE LEFT HIP WITHOUT CONTRAST  TECHNIQUE: Multidetector CT imaging was performed  according to the standard protocol. Multiplanar CT image reconstructions were also generated.  COMPARISON:  Radiographs dated 06/25/2014  FINDINGS: There is a comminuted intertrochanteric fracture of the proximal left femur with slight impaction. The greater and lesser trochanters are fragmented. Femoral head is intact. No dislocation.  Pelvic bones are intact. Severe degenerative changes in the lower lumbar spine. Intra medullary rod and compression screw in the right hip. The fracture has healed on the right.  IMPRESSION: Comminuted intertrochanteric fracture of the proximal left femur as described.   Electronically Signed   By: Geanie Cooley M.D.   On: 06/25/2014 15:08    Scheduled Meds: . sodium chloride   Intravenous Once  . calcium-vitamin D  1 tablet Oral Daily  . dicyclomine  10 mg Oral QHS  . enoxaparin (LOVENOX) injection  40 mg Subcutaneous Q24H  . feeding supplement (RESOURCE BREEZE)  1 Container Oral TID BM  . ferrous sulfate  325 mg Oral QODAY  . latanoprost  1 drop Both Eyes QHS  . levothyroxine  75 mcg Oral QAC breakfast  . loratadine  10 mg Oral Daily  . multivitamin-lutein  1 capsule Oral Daily  . QUEtiapine  100 mg Oral QHS  . senna  1 tablet Oral BID  . sertraline  100 mg Oral Daily  . simvastatin  10 mg Oral q1800  . sodium chloride  500 mL Intravenous Once  . vitamin B-12  100 mcg Oral Daily   Continuous Infusions: . sodium chloride Stopped (06/27/14 0857)    Principal Problem:   Closed left hip fracture Active Problems:   Essential hypertension, benign   Hip fracture   Tachycardia   Hypothyroidism due to acquired atrophy of thyroid   Chronic obstructive asthma, unspecified   Vaginal irritation   Localized skin mass, lump, or swelling    Time spent: 40 minutes   [facility]  [facility] Pager [phone]. If 7PM-7AM, please contact night-coverage at [email or web address], password [email or web address] 06/27/2014, 10:21 AM  LOS: 2 days

## 2014-06-27 NOTE — Progress Notes (Signed)
Echo Lab  2D Echocardiogram completed.  Erleen Egner L Marialice Newkirk, RDCS 06/27/2014 1:23 PM

## 2014-06-27 NOTE — Progress Notes (Signed)
CRITICAL VALUE ALERT  Critical value received:  Hgb  Date of notification: 06/27/14  Time of notification:  0855  Critical value read back: yes  Nurse who received alert:  Lyanne Co RN  MD notified (1st page):  Dr. August Saucer  Time of first page:  0900  MD notified (2nd page):  Time of second page:  Responding MD:  Dr. August Saucer  Time MD responded:  0900  Orders: Repeat CBC, if Hgb results 7.0 or less, transfuse 1 unit of PRBCs

## 2014-06-28 LAB — TYPE AND SCREEN
ABO/RH(D): O POS
Antibody Screen: NEGATIVE
Unit division: 0
Unit division: 0

## 2014-06-28 LAB — CBC
HCT: 31.4 % — ABNORMAL LOW (ref 36.0–46.0)
Hemoglobin: 10.6 g/dL — ABNORMAL LOW (ref 12.0–15.0)
MCH: 28.8 pg (ref 26.0–34.0)
MCHC: 33.8 g/dL (ref 30.0–36.0)
MCV: 85.3 fL (ref 78.0–100.0)
PLATELETS: 129 10*3/uL — AB (ref 150–400)
RBC: 3.68 MIL/uL — AB (ref 3.87–5.11)
RDW: 17.3 % — AB (ref 11.5–15.5)
WBC: 6.9 10*3/uL (ref 4.0–10.5)

## 2014-06-28 LAB — COMPREHENSIVE METABOLIC PANEL
AST: 16 U/L (ref 0–37)
Albumin: 2.8 g/dL — ABNORMAL LOW (ref 3.5–5.2)
Alkaline Phosphatase: 47 U/L (ref 39–117)
Anion gap: 11 (ref 5–15)
BUN: 14 mg/dL (ref 6–23)
CALCIUM: 8.3 mg/dL — AB (ref 8.4–10.5)
CO2: 25 mEq/L (ref 19–32)
Chloride: 103 mEq/L (ref 96–112)
Creatinine, Ser: 0.71 mg/dL (ref 0.50–1.10)
GFR calc non Af Amer: 76 mL/min — ABNORMAL LOW (ref >90)
GFR, EST AFRICAN AMERICAN: 88 mL/min — AB (ref >90)
GLUCOSE: 98 mg/dL (ref 70–99)
POTASSIUM: 3.5 meq/L — AB (ref 3.7–5.3)
Sodium: 139 mEq/L (ref 137–147)
Total Bilirubin: 0.5 mg/dL (ref 0.3–1.2)
Total Protein: 5 g/dL — ABNORMAL LOW (ref 6.0–8.3)

## 2014-06-28 MED ORDER — AMLODIPINE BESYLATE 5 MG PO TABS
5.0000 mg | ORAL_TABLET | Freq: Every day | ORAL | Status: DC
  Administered 2014-06-28 – 2014-06-29 (×2): 5 mg via ORAL
  Filled 2014-06-28 (×2): qty 1

## 2014-06-28 MED ORDER — POTASSIUM CHLORIDE CRYS ER 20 MEQ PO TBCR
40.0000 meq | EXTENDED_RELEASE_TABLET | Freq: Once | ORAL | Status: AC
  Administered 2014-06-28: 40 meq via ORAL
  Filled 2014-06-28: qty 2

## 2014-06-28 NOTE — Progress Notes (Deleted)
OT Cancellation Note  Patient Details Name: Jane [name] MRN: [id number] DOB: 02-01-1925   Cancelled Treatment:    Reason Eval/Treat Not Completed: OT screened, no needs identified, will sign off Pt is Medicare and current D/C plan is SNF. No apparent immediate acute care OT needs, therefore will defer OT to SNF. If OT eval is needed please call Acute Rehab Dept. at [phone] or text page OT at [phone].   Earlie Raveling OTR/L [phone] 06/28/2014, 10:59 AM

## 2014-06-28 NOTE — Progress Notes (Signed)
Rehab Admissions Coordinator Note:  Patient was screened by Clois Dupes for appropriateness for an Inpatient Acute Rehab Consult per PT and OT recommendations. Noted family requesting [facility] which she received therapy after last fracture 01/2014. Noted inpt rehab consult pending. We will follow up on Monday to determine rehab MD and family recommendations for therapy.  Clois Dupes 06/28/2014, 11:34 AM  I can be reached at [phone].

## 2014-06-28 NOTE — Progress Notes (Signed)
Subjective: Pt stable - 2 u prbc yesterday labs pending   Objective: Vital signs in last 24 hours: Temp:  [97.7 F (36.5 C)-99.8 F (37.7 C)] 99.1 F (37.3 C) (08/23 8329) Pulse Rate:  [84-103] 103 (08/23 0632) Resp:  [16-18] 16 (08/23 0832) BP: (86-145)/(32-76) 140/57 mmHg (08/23 0632) SpO2:  [95 %-99 %] 95 % (08/23 0832)  Intake/Output from previous day: 08/22 0701 - 08/23 0700 In: 2038.8 [I.V.:1263.8; Blood:775] Out: 300 [Urine:300] Intake/Output this shift:    Exam:  No cellulitis present  Labs:  Recent Labs  06/25/14 1145 06/26/14 0500 06/27/14 0655 06/27/14 1145  HGB 11.7* 9.0* 6.3* 6.3*    Recent Labs  06/27/14 0655 06/27/14 1145  WBC 5.7 5.6  RBC 2.11* 2.06*  HCT 19.2* 18.9*  PLT 121* 99*    Recent Labs  06/27/14 0655 06/28/14 0404  NA 140 139  K 4.1 3.5*  CL 105 103  CO2 25 25  BUN 17 14  CREATININE 0.79 0.71  GLUCOSE 109* 98  CALCIUM 8.0* 8.3*   No results found for this basename: LABPT, INR,  in the last 72 hours  Assessment/Plan: Plan to be oob to chair today - may need heel lift left for ambulation when more mobile - repeat hct pending   Jane [name] SCOTT 06/28/2014, 9:04 AM

## 2014-06-28 NOTE — Progress Notes (Signed)
Clinical Social Work Department BRIEF PSYCHOSOCIAL ASSESSMENT 06/28/2014  Patient:  Jane [name], Jane [name]     Account Number:  [id number]     Admit date:  06/25/2014  Clinical Social Worker:  Rolinda Roan  Date/Time:  06/28/2014 08:15 PM  Referred by:  Physician  Date Referred:  06/26/2014 Referred for  SNF Placement   Other Referral:   Interview type:  Family Other interview type:    PSYCHOSOCIAL DATA Living Status:  FACILITY Admitted from facility:   Level of care:  Independent Living Primary support name:  Otila Kluver [phone] Primary support relationship to patient:  CHILD, ADULT Degree of support available:   Strong support at bedside.    CURRENT CONCERNS  Other Concerns:    SOCIAL WORK ASSESSMENT / PLAN Clinical Social Worker (CSW) met with patient to discuss D/C plan. Patient's daughter Otila Kluver and HPOA was at bedside. Daughter reported that she would like for patient to go to [facility] for rehab. Daughter reported that patient lives in independent living at [facility] in [place]. CSW completed FL2 and initiated SNF search.   Assessment/plan status:  Psychosocial Support/Ongoing Assessment of Needs Other assessment/ plan:   Information/referral to community resources:    PATIENT'S/FAMILY'S RESPONSE TO PLAN OF CARE: Daughter thanked CSW for visit and assisting with placement process.

## 2014-06-28 NOTE — Progress Notes (Signed)
[facility] PROGRESS NOTE  Jane [name] ZOX:[id number] DOB: Dec 14, 1924 DOA: 06/25/2014 PCP: Bufford Spikes, DO  Assessment/Plan: Principal Problem:   Closed left hip fracture Active Problems:   Essential hypertension, benign   Hip fracture   Tachycardia   Hypothyroidism due to acquired atrophy of thyroid   Chronic obstructive asthma, unspecified   Vaginal irritation   Localized skin mass, lump, or swelling    Left Hip Fracture.  Non displaced. Status post surgery 8/21  Dr. Roda Shutters consulted. CT Pelvis shows Comminuted intertrochanteric fracture of the proximal left femur  Management per orthopedics.  Discontinued Lovenox for DVT prophylaxis because of anemia, placed on SCDs  When necessary morphine, Robaxin, Vicodin for pain control  Will need an outpatient bone density scan  Vitamin D. level is normal Possible discharged to CIR-consult requested    Back pain  As per orthopedic recommendations will obtain x-rays of the thoracic and lumbar spine    Anemia, acute blood loss anemia  Significant drop in hemoglobin from 11.7-6.3 , hemoglobin 10.6 after 2 units on 8/22 Repeating CBC  Lovenox discontinued    HTN currently hypotensive  Discontinued lisinopril and Norvasc yesterday because of hypotension Can restart her Norvasc today Minimize narcotics   Dizziness with Fall  Patient reports she is "always dizzy" daughter reports this is inaccurate.  Orthostatic vital signs.  Gentle IVF.  2D Echo shows EF of 50-60%, LV normal in size PT / OT ordered.   Hypothyroidism  Continue synthroid , TSH and 8/6 was 3.05   Hyperlipidemia  Continue statin. Next   Skin mass on buttocks  Being worked up outpatient  Thought to be from previously healed sacral pressure ulceration X.   Vaginal irritation/vaginitis.  Hold Diflucan.  Continue outpatient work up. Next   COPD  Mild hypoxia on admission (this may be her norm)  Placed on o2 N/C PRN  Appears stable.  Will  continue prn albuterol.    Code Status: full  Family Communication: family updated about patient's clinical progress  Disposition Plan: As above   Brief narrative:  Jane [name] is a 80 y.o. female who complains of left hip fx s/p mechanical fall at home. Lives independently at assisted living facility. Had hip fx in march 2015 fixed by Dr. Eulah Pont. Was doing well and back to her old routine when this happened. Walks with walker at baseline. Denies syncope, LOC, neck pain, abd pain. Ortho consulted for hip fx.  Consultants:  Orthopedics Procedures:  intertrochanteric fracture with intramedullary implant  Antibiotics:  None       HPI/Subjective: Blood pressure stable overnight, hemodynamically stable, still feels weak  Objective: Filed Vitals:   06/27/14 1835 06/27/14 2111 06/28/14 0632 06/28/14 0832  BP: 110/76 145/55 140/57   Pulse: 93 100 103   Temp: 98.4 F (36.9 C) 97.7 F (36.5 C) 99.1 F (37.3 C)   TempSrc: Oral Oral Oral   Resp: Height:      Weight:      SpO2: 95% 95% 95% 95%    Intake/Output Summary (Last 24 hours) at 06/28/14 1108 Last data filed at 06/27/14 1850  Gross per 24 hour  Intake 1888.75 ml  Output    300 ml  Net 1588.75 ml    Exam:  General: alert & oriented x 3 In NAD  Cardiovascular: RRR, nl S1 s2  Respiratory: Decreased breath sounds at the bases, scattered rhonchi, no crackles  Abdomen: soft +BS NT/ND, no masses palpable  Extremities: No cyanosis and  no edema      Data Reviewed: Basic Metabolic Panel:  Recent Labs Lab 06/25/14 1145 06/26/14 0500 06/27/14 0655 06/28/14 0404  NA 141 141 140 139  K 4.4 4.1 4.1 3.5*  CL 103 104 105 103  CO2 29 29 25 25   GLUCOSE 116* 115* 109* 98  BUN 21 20 17 14   CREATININE 0.74 0.82 0.79 0.71  CALCIUM 9.6 9.0 8.0* 8.3*    Liver Function Tests:  Recent Labs Lab 06/27/14 0655 06/28/14 0404  AST 13 16  ALT 6 <5  ALKPHOS 45 47  BILITOT 0.3 0.5  PROT 4.7* 5.0*   ALBUMIN 2.6* 2.8*   No results found for this basename: LIPASE, AMYLASE,  in the last 168 hours No results found for this basename: AMMONIA,  in the last 168 hours  CBC:  Recent Labs Lab 06/25/14 1145 06/26/14 0500 06/27/14 0655 06/27/14 1145 06/28/14 0836  WBC 12.3* 7.5 5.7 5.6 6.9  NEUTROABS 11.0*  --   --   --   --   HGB 11.7* 9.0* 6.3* 6.3* 10.6*  HCT 34.6* 27.3* 19.2* 18.9* 31.4*  MCV 91.5 91.0 91.0 91.7 85.3  PLT 170 165 121* 99* 129*    Cardiac Enzymes: No results found for this basename: CKTOTAL, CKMB, CKMBINDEX, TROPONINI,  in the last 168 hours BNP (last 3 results) No results found for this basename: PROBNP,  in the last 8760 hours   CBG: No results found for this basename: GLUCAP,  in the last 168 hours  Recent Results (from the past 240 hour(s))  SURGICAL PCR SCREEN     Status: None   Collection Time    06/26/14  5:15 AM      Result Value Ref Range Status   MRSA, PCR NEGATIVE  NEGATIVE Final   Staphylococcus aureus NEGATIVE  NEGATIVE Final   Comment:            The Xpert SA Assay (FDA     approved for NASAL specimens     in patients over 99 years of age),     is one component of     a comprehensive surveillance     program.  Test performance has     been validated by [employer] for patients greater     than or equal to 69 year old.     It is not intended     to diagnose infection nor to     guide or monitor treatment.     Studies: Dg Chest 1 View  06/25/2014   CLINICAL DATA:  Fall, pain  EXAM: CHEST - 1 VIEW  COMPARISON:  01/26/2014  FINDINGS: Lungs are hyperinflated compatible with COPD/ emphysema. Heart is enlarged. No superimposed CHF. Resolved right lower lobe pneumonia compared to 01/26/2014. No current airspace process, edema, collapse or consolidation. No large effusion or pneumothorax. Atherosclerosis of the aorta. Right lower lobe granulomatous disease suspected.  IMPRESSION: COPD/ emphysema.  Hyperinflation.  Resolved right lower  lobe pneumonia.   Electronically Signed   By: Ruel Favors M.D.   On: 06/25/2014 12:29   Dg Thoracic Spine W/swimmers  06/27/2014   CLINICAL DATA:  80 year old female status post fall with recent left hip fracture. Pain. Initial encounter.  EXAM: THORACIC SPINE - 2 VIEW + SWIMMERS  COMPARISON:  Lumbar series from the same day reported separately. Chest CT 03/25/2008.  FINDINGS: Extensive calcified atherosclerosis of the aorta. Osteopenia. Normal thoracic segmentation. Levo convex thoracolumbar scoliosis. Allowing for the  scoliosis on the cross-table lateral view, no thoracic compression deformity identified. Thoracic vertebral height and alignment appears stable. Cervicothoracic junction alignment is within normal limits.  IMPRESSION: No definite thoracic spine fracture; osteopenia and scoliosis. If occult fracture is suspected, thoracic MRI or whole-body bone scan would be most sensitive.   Electronically Signed   By: Augusto Gamble M.D.   On: 06/27/2014 20:17   Dg Lumbar Spine Complete  06/27/2014   CLINICAL DATA:  80 year old female with back pain. Recent fall with left hip fracture. Initial encounter.  EXAM: LUMBAR SPINE - COMPLETE 4+ VIEW  COMPARISON:  06/25/2014 lumbar radiographs and earlier.  FINDINGS: Extensive Aortoiliac calcified atherosclerosis noted. Lumbar vertebral height and alignment are stable, with levoconvex scoliosis again noted. Staple line now overlies the S1 level on the lateral view. Grossly intact visualized lower thoracic levels. Facet degeneration. Osteopenia. Partially visible proximal left femur hardware. Grossly intact sacrum.  IMPRESSION: No acute fracture or listhesis identified in the lumbar spine. If there is suspicion for occult fracture, lumbar MRI or nuclear medicine whole-body bone scan would be most sensitive.   Electronically Signed   By: Augusto Gamble M.D.   On: 06/27/2014 20:12   Dg Lumbar Spine Complete  06/25/2014   CLINICAL DATA:  Fall, left hip pain  EXAM: LUMBAR SPINE  - COMPLETE 4+ VIEW  COMPARISON:  Concurrently obtained radiographs of the pelvis and left femur  FINDINGS: The bones are osteopenic. There is multilevel degenerative disease throughout the lumbar spine. Degenerative levoconvex scoliosis centered at L2. Atherosclerotic vascular calcifications are present throughout the aortoiliac system.  No acute fracture or malalignment.  IMPRESSION: 1. No acute fracture or malalignment. 2. Aortoiliac atherosclerosis. 3. Osteopenia. 4. Multilevel degenerative disc disease with degenerative levoconvex scoliosis centered at L2.   Electronically Signed   By: Malachy Moan M.D.   On: 06/25/2014 12:48   Dg Pelvis 1-2 Views  06/25/2014   CLINICAL DATA:  Fall, left hip pain  EXAM: PELVIS - 1-2 VIEW  COMPARISON:  Concurrently obtained radiographs of the left femur; Prior radiographs of the pelvis 01/27/2014  FINDINGS: Subtle disruption of the cortical surface in the mid aspect of the lesser trochanter and also along the intertrochanteric line. Although no definite fracture is identified, a nondisplaced intertrochanteric fracture is strongly suspected. The remainder of the visualized bony pelvis appears intact. Surgical changes of prior ORIF of a now healed right intertrochanteric fracture. Multilevel degenerative change in the lumbar spine. The bones are osteopenic. Atherosclerotic vascular calcifications.  IMPRESSION: 1. Strongly suspect nondisplaced left intertrochanteric fracture. If further imaging is warranted for confirmation, MRI of the pelvis is recommended given the patient's underlying osteopenia. 2. Surgical changes of prior ORIF of a now healed right intertrochanteric fracture. 3. Atherosclerotic vascular calcifications.   Electronically Signed   By: Malachy Moan M.D.   On: 06/25/2014 12:44   Dg Hip Operative Left  06/26/2014   CLINICAL DATA:  Left hip fracture.  EXAM: OPERATIVE LEFT HIP  COMPARISON:  Pelvis radiograph 06/25/2014  FINDINGS: Four intraoperative  spot fluoroscopic images of the left femur are provided. These demonstrate interval internal fixation of the previously described intertrochanteric fracture with an anterograde intramedullary nail and 2 proximal screws. The lesser trochanter fracture is mildly displaced medially. The femoral head remains approximated with the acetabulum. Vascular calcification is noted.  IMPRESSION: Intraoperative images during internal fixation of intertrochanteric fracture of the left femur.   Electronically Signed   By: Sebastian Ache   On: 06/26/2014 09:27   Dg  Femur Left  06/25/2014   CLINICAL DATA:  Fall  EXAM: LEFT FEMUR - 2 VIEW  COMPARISON:  Concurrently obtained radiographs of the pelvis  FINDINGS: On the lateral view, there is a lucency in the intertrochanteric region of the femur. On the frontal view, the lucency is not well seen. I strongly suspect a nondisplaced and nearly occult intertrochanteric fracture. No knee joint effusion. Atherosclerotic calcifications present throughout the common femoral, superficial femoral and popliteal arteries.  IMPRESSION: 1. Suspect nearly occult, nondisplaced intertrochanteric fracture. A lucency can be seen within the intertrochanteric region on the cross-table lateral view. Recommend further evaluation with MRI of the pelvis for confirmation. 2. Atherosclerotic vascular calcifications.   Electronically Signed   By: Malachy Moan M.D.   On: 06/25/2014 12:41   Ct Head Wo Contrast  06/25/2014   CLINICAL DATA:  Fall  EXAM: CT HEAD WITHOUT CONTRAST  TECHNIQUE: Contiguous axial images were obtained from the base of the skull through the vertex without intravenous contrast.  COMPARISON:  None.  FINDINGS: Negative for acute intracranial hemorrhage, acute infarction, mass, mass effect, hydrocephalus or midline shift. Gray-white differentiation is preserved throughout. Advanced, confluent periventricular and subcortical white matter hypoattenuation which is nonspecific but most  suggestive of the sequela of longstanding microvascular ischemic white matter disease. Remote lacunar infarct versus dilated perivascular space in the right subinsular cortex. No focal scalp hematoma or calvarial fracture. Bilateral lens extractions. Normal aeration of the mastoid air cells and visualized paranasal sinuses. Atherosclerotic calcifications noted in both cavernous carotid arteries.  IMPRESSION: 1. No acute intracranial abnormality. 2. Advanced sequelae of chronic microvascular ischemic white matter disease. 3. Remote lacunar infarct versus dilated perivascular space in the right subinsular cortex. 4. Intracranial atherosclerosis.   Electronically Signed   By: Malachy Moan M.D.   On: 06/25/2014 12:51   US Pelvis Limited  06/22/2014   CLINICAL DATA:  Tender right buttock mass.  EXAM: US PELVIS LIMITED  TECHNIQUE: Ultrasound examination of the pelvic soft tissues was performed in the area of clinical concern.  COMPARISON:  None.  FINDINGS: There is a poorly marginated 4.2 x 5.1 x 1.7 cm inhomogeneous soft tissue mass in the subcutaneous fat of the right buttock just to the right of the sacrum. There is some blood flow within this area. The appearance is not consistent with a benign lipoma.  IMPRESSION: Poorly defined soft tissue mass in the right buttock. The appearance is nonspecific. I suspect this is a chronic inflammatory process. There is a subtle area of abnormality in the overlying skin suggesting there may have been a prior infection at this site. The patient denies having a known prior sacral ulcer.  The patient also reports that the size of the lesion fluctuates.  This could be further assessed and better characterized with soft tissue MRI with and without contrast if the lesion enlarges or persists.   Electronically Signed   By: Geanie Cooley M.D.   On: 06/22/2014 15:09   Ct Hip Left Wo Contrast  06/25/2014   CLINICAL DATA:  Severe left lateral hip pain secondary to a fall today.   EXAM: CT OF THE LEFT HIP WITHOUT CONTRAST  TECHNIQUE: Multidetector CT imaging was performed according to the standard protocol. Multiplanar CT image reconstructions were also generated.  COMPARISON:  Radiographs dated 06/25/2014  FINDINGS: There is a comminuted intertrochanteric fracture of the proximal left femur with slight impaction. The greater and lesser trochanters are fragmented. Femoral head is intact. No dislocation.  Pelvic bones are intact.  Severe degenerative changes in the lower lumbar spine. Intra medullary rod and compression screw in the right hip. The fracture has healed on the right.  IMPRESSION: Comminuted intertrochanteric fracture of the proximal left femur as described.   Electronically Signed   By: Geanie Cooley M.D.   On: 06/25/2014 15:08    Scheduled Meds: . calcium-vitamin D  1 tablet Oral Daily  . dicyclomine  10 mg Oral QHS  . feeding supplement (RESOURCE BREEZE)  1 Container Oral TID BM  . ferrous sulfate  325 mg Oral QODAY  . latanoprost  1 drop Both Eyes QHS  . levothyroxine  75 mcg Oral QAC breakfast  . loratadine  10 mg Oral Daily  . multivitamin-lutein  1 capsule Oral Daily  . QUEtiapine  100 mg Oral QHS  . senna  1 tablet Oral BID  . sertraline  100 mg Oral Daily  . simvastatin  10 mg Oral q1800  . vitamin B-12  100 mcg Oral Daily   Continuous Infusions:   Principal Problem:   Closed left hip fracture Active Problems:   Essential hypertension, benign   Hip fracture   Tachycardia   Hypothyroidism due to acquired atrophy of thyroid   Chronic obstructive asthma, unspecified   Vaginal irritation   Localized skin mass, lump, or swelling    Time spent: 40 minutes   [facility]  [facility] Pager [phone]. If 7PM-7AM, please contact night-coverage at [email or web address], password [facility] 06/28/2014, 11:08 AM  LOS: 3 days

## 2014-06-28 NOTE — Evaluation (Signed)
Physical Therapy Evaluation Patient Details Name: Jane [name] MRN: [id number] DOB: 18-Nov-1924 Today's Date: 06/28/2014   History of Present Illness  80 y.o. female s/p intertrochanteric fracture with intramedullary implant.  Clinical Impression  Patient is seen following the above procedure and presents with functional limitations due to the deficits listed below (see PT Problem List). Per patient she was independent with an assistive device prior to this fall while living in an independent living facility. Will require continued therapy to return to prior level of function. Patient will benefit from skilled PT to increase their independence and safety with mobility to allow discharge to the venue listed below.       Follow Up Recommendations CIR    Equipment Recommendations  3in1 (PT)    Recommendations for Other Services       Precautions / Restrictions Precautions Precautions: Fall Restrictions Weight Bearing Restrictions: Yes LLE Weight Bearing: Weight bearing as tolerated      Mobility  Bed Mobility Overal bed mobility: Needs Assistance Bed Mobility: Supine to Sit     Supine to sit: Mod assist;HOB elevated     General bed mobility comments: Mod assist for LLE support and use of bed pad to scoot patient to edge of bed. Heavy use of bed rail and able to push trunk to seated position.  Transfers Overall transfer level: Needs assistance Equipment used: Rolling walker (2 wheeled) Transfers: Sit to/from [employer] Sit to Stand: Mod assist Stand pivot transfers: Mod assist       General transfer comment: Mod assist for boost x2 from lowest bed setting. VC for hand placement and forward weight shift. Assist needed for RW control with education for safe use. able to take small steps for pivot with VC for placement. Poor control with descent requiring assistance.  Ambulation/Gait                Stairs            Wheelchair Mobility     Modified Rankin (Stroke Patients Only)       Balance Overall balance assessment: Needs assistance;History of Falls Sitting-balance support: Feet supported;Single extremity supported Sitting balance-Leahy Scale: Poor   Postural control: Right lateral lean Standing balance support: Bilateral upper extremity supported Standing balance-Leahy Scale: Poor                               Pertinent Vitals/Pain Pain Assessment: 0-10 Pain Score: 5  Pain Location: left hip Pain Intervention(s): Limited activity within patient's tolerance;Monitored during session;Repositioned    Home Living Family/patient expects to be discharged to:: Unsure Living Arrangements: Alone Available Help at Discharge: Other (Comment) (lives alone) Type of Home: Independent living facility         Home Equipment: Dan Humphreys - 2 wheels;Shower seat      Prior Function Level of Independence: Independent with assistive device(s)         Comments: Used walker for ambulation. Independent with ADLs     Hand Dominance   Dominant Hand: Right    Extremity/Trunk Assessment   Upper Extremity Assessment: Defer to OT evaluation           Lower Extremity Assessment: LLE deficits/detail   LLE Deficits / Details: decreased strength and ROM as expected post op.     Communication   Communication: HOH  Cognition Arousal/Alertness: Awake/alert Behavior During Therapy: Restless;Agitated Overall Cognitive Status: No family/caregiver present to determine baseline cognitive functioning  General Comments General comments (skin integrity, edema, etc.): Pt alert and oriented x3 however speaking to herself often on subjects irrelevant to tasks at hand.    Exercises Total Joint Exercises Ankle Circles/Pumps: AROM;Both;10 reps;Seated Heel Slides: Both;10 reps;Seated;Strengthening      Assessment/Plan    PT Assessment Patient needs continued PT services  PT  Diagnosis Difficulty walking;Abnormality of gait;Acute pain   PT Problem List Decreased strength;Decreased range of motion;Decreased activity tolerance;Decreased balance;Decreased mobility;Decreased cognition;Decreased knowledge of use of DME;Decreased safety awareness;Pain  PT Treatment Interventions DME instruction;Gait training;Functional mobility training;Therapeutic activities;Therapeutic exercise;Balance training;Neuromuscular re-education;Cognitive remediation;Patient/family education;Modalities   PT Goals (Current goals can be found in the Care Plan section) Acute Rehab PT Goals Patient Stated Goal: None stated PT Goal Formulation: With patient Time For Goal Achievement: 07/05/14 Potential to Achieve Goals: Good    Frequency Min 5X/week   Barriers to discharge Decreased caregiver support lives alone    Co-evaluation               End of Session   Activity Tolerance: Patient limited by pain Patient left: in chair;with call bell/phone within reach;with chair alarm set Nurse Communication: Mobility status         Time: 3888-7579 PT Time Calculation (min): 35 min   Charges:   PT Evaluation $Initial PT Evaluation Tier I: 1 Procedure PT Treatments $Therapeutic Activity: 23-37 mins   PT G Codes:         Charlsie Merles, [place] [phone]  [place] 06/28/2014, 11:00 AM

## 2014-06-28 NOTE — Progress Notes (Signed)
Clinical Social Work Department CLINICAL SOCIAL WORK PLACEMENT NOTE 06/28/2014  Patient:  JULEE, STOLL  Account Number:  [id number] Admit date:  06/25/2014  Clinical Social Worker:  Jetta Lout, Theresia Majors  Date/time:  06/28/2014 08:19 PM  Clinical Social Work is seeking post-discharge placement for this patient at the following level of care:   SKILLED NURSING   (*CSW will update this form in Epic as items are completed)   06/28/2014  Patient/family provided with [facility] Department of Clinical Social Work's list of facilities offering this level of care within the geographic area requested by the patient (or if unable, by the patient's family).  06/28/2014  Patient/family informed of their freedom to choose among providers that offer the needed level of care, that participate in Medicare, Medicaid or managed care program needed by the patient, have an available bed and are willing to accept the patient.  06/28/2014  Patient/family informed of [facility]' ownership interest in [facility], as well as of the fact that they are under no obligation to receive care at this facility.  PASARR submitted to EDS on  PASARR number received on   FL2 transmitted to all facilities in geographic area requested by pt/family on  06/28/2014 FL2 transmitted to all facilities within larger geographic area on   Patient informed that his/her managed care company has contracts with or will negotiate with  certain facilities, including the following:     Patient/family informed of bed offers received:   Patient chooses bed at  Physician recommends and patient chooses bed at    Patient to be transferred to  on   Patient to be transferred to facility by  Patient and family notified of transfer on  Name of family member notified:    The following physician request were entered in Epic:   Additional Comments: Patient has existing PASARR.

## 2014-06-29 ENCOUNTER — Encounter (HOSPITAL_COMMUNITY): Payer: Self-pay | Admitting: Orthopaedic Surgery

## 2014-06-29 ENCOUNTER — Telehealth: Payer: Self-pay | Admitting: *Deleted

## 2014-06-29 DIAGNOSIS — S72145S Nondisplaced intertrochanteric fracture of left femur, sequela: Secondary | ICD-10-CM | POA: Diagnosis not present

## 2014-06-29 DIAGNOSIS — I959 Hypotension, unspecified: Secondary | ICD-10-CM | POA: Diagnosis not present

## 2014-06-29 DIAGNOSIS — F0390 Unspecified dementia without behavioral disturbance: Secondary | ICD-10-CM | POA: Diagnosis not present

## 2014-06-29 DIAGNOSIS — M25559 Pain in unspecified hip: Secondary | ICD-10-CM | POA: Diagnosis not present

## 2014-06-29 DIAGNOSIS — G4700 Insomnia, unspecified: Secondary | ICD-10-CM | POA: Diagnosis not present

## 2014-06-29 DIAGNOSIS — S72143A Displaced intertrochanteric fracture of unspecified femur, initial encounter for closed fracture: Secondary | ICD-10-CM | POA: Diagnosis not present

## 2014-06-29 DIAGNOSIS — Z5189 Encounter for other specified aftercare: Secondary | ICD-10-CM | POA: Diagnosis not present

## 2014-06-29 DIAGNOSIS — I10 Essential (primary) hypertension: Secondary | ICD-10-CM | POA: Diagnosis not present

## 2014-06-29 DIAGNOSIS — S72009S Fracture of unspecified part of neck of unspecified femur, sequela: Secondary | ICD-10-CM | POA: Diagnosis not present

## 2014-06-29 DIAGNOSIS — R109 Unspecified abdominal pain: Secondary | ICD-10-CM | POA: Diagnosis not present

## 2014-06-29 DIAGNOSIS — J441 Chronic obstructive pulmonary disease with (acute) exacerbation: Secondary | ICD-10-CM | POA: Diagnosis not present

## 2014-06-29 DIAGNOSIS — F419 Anxiety disorder, unspecified: Secondary | ICD-10-CM | POA: Diagnosis not present

## 2014-06-29 DIAGNOSIS — S72009D Fracture of unspecified part of neck of unspecified femur, subsequent encounter for closed fracture with routine healing: Secondary | ICD-10-CM | POA: Diagnosis not present

## 2014-06-29 DIAGNOSIS — R52 Pain, unspecified: Secondary | ICD-10-CM | POA: Diagnosis not present

## 2014-06-29 DIAGNOSIS — S72109A Unspecified trochanteric fracture of unspecified femur, initial encounter for closed fracture: Secondary | ICD-10-CM | POA: Diagnosis not present

## 2014-06-29 DIAGNOSIS — R488 Other symbolic dysfunctions: Secondary | ICD-10-CM | POA: Diagnosis not present

## 2014-06-29 DIAGNOSIS — W19XXXA Unspecified fall, initial encounter: Secondary | ICD-10-CM | POA: Diagnosis not present

## 2014-06-29 DIAGNOSIS — R443 Hallucinations, unspecified: Secondary | ICD-10-CM | POA: Diagnosis not present

## 2014-06-29 DIAGNOSIS — S72146B Nondisplaced intertrochanteric fracture of unspecified femur, initial encounter for open fracture type I or II: Secondary | ICD-10-CM | POA: Diagnosis not present

## 2014-06-29 DIAGNOSIS — K5909 Other constipation: Secondary | ICD-10-CM | POA: Diagnosis not present

## 2014-06-29 DIAGNOSIS — S72145A Nondisplaced intertrochanteric fracture of left femur, initial encounter for closed fracture: Secondary | ICD-10-CM | POA: Diagnosis not present

## 2014-06-29 DIAGNOSIS — N760 Acute vaginitis: Secondary | ICD-10-CM | POA: Diagnosis not present

## 2014-06-29 DIAGNOSIS — S72146A Nondisplaced intertrochanteric fracture of unspecified femur, initial encounter for closed fracture: Secondary | ICD-10-CM | POA: Diagnosis not present

## 2014-06-29 DIAGNOSIS — M6281 Muscle weakness (generalized): Secondary | ICD-10-CM | POA: Diagnosis not present

## 2014-06-29 DIAGNOSIS — F064 Anxiety disorder due to known physiological condition: Secondary | ICD-10-CM | POA: Diagnosis not present

## 2014-06-29 DIAGNOSIS — J449 Chronic obstructive pulmonary disease, unspecified: Secondary | ICD-10-CM | POA: Diagnosis not present

## 2014-06-29 DIAGNOSIS — R000 Tachycardia, unspecified: Secondary | ICD-10-CM | POA: Diagnosis not present

## 2014-06-29 DIAGNOSIS — J3089 Other allergic rhinitis: Secondary | ICD-10-CM | POA: Diagnosis not present

## 2014-06-29 DIAGNOSIS — E039 Hypothyroidism, unspecified: Secondary | ICD-10-CM | POA: Diagnosis not present

## 2014-06-29 DIAGNOSIS — K5900 Constipation, unspecified: Secondary | ICD-10-CM | POA: Diagnosis not present

## 2014-06-29 DIAGNOSIS — S72009A Fracture of unspecified part of neck of unspecified femur, initial encounter for closed fracture: Secondary | ICD-10-CM | POA: Diagnosis not present

## 2014-06-29 DIAGNOSIS — D649 Anemia, unspecified: Secondary | ICD-10-CM | POA: Diagnosis not present

## 2014-06-29 DIAGNOSIS — S72142D Displaced intertrochanteric fracture of left femur, subsequent encounter for closed fracture with routine healing: Secondary | ICD-10-CM | POA: Diagnosis not present

## 2014-06-29 LAB — CBC
HEMATOCRIT: 25.3 % — AB (ref 36.0–46.0)
HEMOGLOBIN: 8.6 g/dL — AB (ref 12.0–15.0)
MCH: 29.3 pg (ref 26.0–34.0)
MCHC: 34 g/dL (ref 30.0–36.0)
MCV: 86.1 fL (ref 78.0–100.0)
Platelets: 146 10*3/uL — ABNORMAL LOW (ref 150–400)
RBC: 2.94 MIL/uL — ABNORMAL LOW (ref 3.87–5.11)
RDW: 16.6 % — AB (ref 11.5–15.5)
WBC: 5.7 10*3/uL (ref 4.0–10.5)

## 2014-06-29 MED ORDER — POLYETHYLENE GLYCOL 3350 17 G PO PACK: 17.0000 g | PACK | Freq: Every day | ORAL | Status: DC | PRN

## 2014-06-29 MED ORDER — ENOXAPARIN SODIUM 40 MG/0.4ML ~~LOC~~ SOLN: 40.0000 mg | Freq: Every day | SUBCUTANEOUS | Status: DC

## 2014-06-29 NOTE — Progress Notes (Signed)
Physical Therapy Treatment Patient Details Name: Dnyla Antonetti MRN: [id number] DOB: 04/22/1925 Today's Date: 06/29/2014    History of Present Illness 80 y.o. female s/p intertrochanteric fracture with intramedullary implant.    PT Comments    Patients daughter is pursuing SNF at this time. Patient moving slowly and is limited by pain. Patient was able to transfer recliner <> bsc and take pivotal steps. Will conitinue to follow  Follow Up Recommendations  SNF     Equipment Recommendations  3in1 (PT)    Recommendations for Other Services       Precautions / Restrictions Precautions Precautions: Fall Restrictions LLE Weight Bearing: Weight bearing as tolerated    Mobility  Bed Mobility               General bed mobility comments: Patient sitting up in recliner before and after session  Transfers Overall transfer level: Needs assistance Equipment used: Rolling walker (2 wheeled)   Sit to Stand: Mod assist;+2 physical assistance Stand pivot transfers: Mod assist;+2 physical assistance       General transfer comment: Mod A to power up from lower surface of recliner. A to ensure balance as patient can jerk at times without warning. Cues for technique and hand placement. patient SPT from recliner <>BSC. Able to take some small pivotal steps with transfer.   Ambulation/Gait                 Stairs            Wheelchair Mobility    Modified Rankin (Stroke Patients Only)       Balance                                    Cognition Arousal/Alertness: Awake/alert Behavior During Therapy: Restless Overall Cognitive Status: History of cognitive impairments - at baseline                      Exercises      General Comments        Pertinent Vitals/Pain Pain Assessment: Faces Faces Pain Scale: Hurts whole lot Pain Location: Left hip. Grimace with some movements Pain Intervention(s): Limited activity within patient's  tolerance;Monitored during session;Patient requesting pain meds-RN notified    Home Living                      Prior Function            PT Goals (current goals can now be found in the care plan section) Progress towards PT goals: Progressing toward goals    Frequency  Min 3X/week    PT Plan Discharge plan needs to be updated    Co-evaluation             End of Session Equipment Utilized During Treatment: Gait belt Activity Tolerance: Patient limited by pain Patient left: in chair;with call bell/phone within reach     Time: 1341-1410 PT Time Calculation (min): 29 min  Charges:  $Therapeutic Activity: 23-37 mins                    G Codes:      Fredrich Birks 06/29/2014, 2:38 PM 06/29/2014 Fredrich Birks PTA [phone] pager [phone] office

## 2014-06-29 NOTE — Telephone Encounter (Signed)
Patient daughter, [name], called and just wanted to let you know that her mother fell again on 06/25/2014 and broke her Left Hip and will be in Rehab.

## 2014-06-29 NOTE — Progress Notes (Signed)
   Subjective:  Patient confused this am.  Objective:   VITALS:   Filed Vitals:   06/28/14 1507 06/28/14 1600 06/28/14 2152 06/29/14 0604  BP: 117/55  135/46 127/49  Pulse: 100  89 90  Temp: 97.9 F (36.6 C)  98.1 F (36.7 C) 98 F (36.7 C)  TempSrc: Oral  Oral Oral  Resp: 18 16 16 16   Height:      Weight:      SpO2: 100% 98% 100% 95%    Dressings c/d/i Left thigh with appropriate amount of postop swelling Compartments soft NVI LLE   Lab Results  Component Value Date   WBC 5.7 06/29/2014   HGB 8.6* 06/29/2014   HCT 25.3* 06/29/2014   MCV 86.1 06/29/2014   PLT 146* 06/29/2014     Assessment/Plan:  3 Days Post-Op   - Expected postop acute blood loss anemia - will monitor for symptoms - Up with PT/OT - CIR - DVT ppx - SCDs, ambulation, lovenox d/c'ed due to continued anemia - WBAT left lower extremity - no signs of hematoma or unexpected postop bleeding  07/01/2014 06/29/2014, 7:00 AM [phone]

## 2014-06-29 NOTE — Progress Notes (Signed)
Rehab admissions - Evaluated for possible admission.  Please see rehab MD consult recommending SNF placement.  Please see social worker note.  Agree with SNF placement.  Call me for questions.  #[phone]

## 2014-06-29 NOTE — Progress Notes (Signed)
OT Cancellation Note  Patient Details Name: Jane [name] MRN: [id number] DOB: April 30, 1925   Cancelled Treatment:    Reason Eval/Treat Not Completed: Other (comment).Pt is Medicare and current D/C plan is SNF. No apparent immediate acute care OT needs, therefore will defer OT to SNF. If OT eval is needed please call Acute Rehab Dept. at [phone] or text page OT at [phone].    Evette Georges [phone] 06/29/2014, 8:45 AM

## 2014-06-29 NOTE — Telephone Encounter (Signed)
I have seen the notes.  I'm so sorry to hear that.

## 2014-06-29 NOTE — Consult Note (Signed)
Physical Medicine and Rehabilitation Consult Reason for Consult: Left hip fracture Referring Physician: [facility]   HPI: Jane [name] is a 80 y.o. right-handed female with history of hypertension, COPD as well as right hip fracture March of 2015 and currently resides independent living facility at [facility]. Admitted 06/25/2014 after mechanical fall without loss of consciousness sustaining a left intertrochanteric hip fracture. Patient was independent prior to admission with assistive device. Cranial CT scan negative for acute changes. Underwent intramedullary nail implant 06/26/2014 per Dr.XU. Weightbearing as tolerated left lower extremity. Hospital course pain management as well as bouts of confusion. Substance Lovenox for DVT prophylaxis. Acute blood loss anemia 6.3 transfused with latest hemoglobin 8.6. Lovenox later discontinued secondary to anemia. Bouts of dizziness with mobility with Echocardiogram completed for workup of dizziness showing ejection fraction of 60% and no wall motion abnormalities. Physical therapy evaluation completed 06/28/2014 with recommendations of physical medicine rehabilitation consult.   Review of Systems  Constitutional: Positive for weight loss.  Gastrointestinal: Positive for constipation.  Musculoskeletal: Positive for falls, joint pain and myalgias.  Psychiatric/Behavioral:       Anxiety  All other systems reviewed and are negative.  Past Medical History  Diagnosis Date  . Abdominal pain, epigastric   . Impacted cerumen   . Abnormality of gait   . Anal fissure   . Loss of weight   . Hyposmolality and/or hyponatremia   . Pain in limb   . Other vitamin B12 deficiency anemia   . Chest pain, unspecified   . Anemia, unspecified   . Anxiety state, unspecified   . Abdominal pain, right upper quadrant   . Other pulmonary embolism and infarction   . Intestinal or peritoneal adhesions with obstruction (postoperative) (postinfection)   . Long  term (current) use of anticoagulants   . Abdominal pain, right lower quadrant   . Essential and other specified forms of tremor   . Lumbago   . Unspecified hypothyroidism   . Major depressive disorder, single episode, unspecified   . Other and unspecified hyperlipidemia   . Macular degeneration (senile) of retina, unspecified   . Unspecified glaucoma   . Unspecified cataract   . Unspecified hearing loss   . Unspecified essential hypertension   . Osteoarthrosis, unspecified whether generalized or localized, unspecified site   . Osteoporosis, unspecified   . Abnormal involuntary movements(781.0)   . Emphysema of lung    Past Surgical History  Procedure Laterality Date  . Excisional hemorrhoidectomy  1980  . Bladder suspension  1993  . Back surgery  2003  . Abdominal hysterectomy    . Trigger finger release  2009    Dr Teressa Senter  . Intramedullary (im) nail intertrochanteric Right 01/27/2014    Procedure: INTRAMEDULLARY (IM) NAIL INTERTROCHANTRIC;  Surgeon: Sheral Apley, MD;  Location: [facility] OR;  Service: Orthopedics;  Laterality: Right;   Family History  Problem Relation Age of Onset  . Congestive Heart Failure Mother   . Macular degeneration Mother   . Heart disease Mother   . Cancer Sister     lung cancer  . Arthritis Daughter   . Heart disease Daughter   . Glaucoma Sister   . Cataracts Sister    Social History:  reports that she has quit smoking. Her smoking use included Cigarettes. She smoked 0.00 packs per day for 40 years. She does not have any smokeless tobacco history on file. She reports that she does not drink alcohol or use illicit drugs. Allergies:  Allergies  Allergen Reactions  . Celebrex [Celecoxib]     unknown  . Chlorzoxazone     unknown   Medications Prior to Admission  Medication Sig Dispense Refill  . acetaminophen (TYLENOL) 650 MG CR tablet Take 650 mg by mouth every 8 (eight) hours as needed for pain.      Marland Kitchen albuterol (PROVENTIL HFA;VENTOLIN HFA)  108 (90 BASE) MCG/ACT inhaler Inhale 2 puffs into the lungs every 6 (six) hours as needed. Shortness of breath.  3 Inhaler  1  . amLODipine (NORVASC) 5 MG tablet Take 1 tablet (5 mg total) by mouth daily.  90 tablet  1  . calcium carbonate (TUMS - DOSED IN MG ELEMENTAL CALCIUM) 500 MG chewable tablet Chew 1 tablet by mouth daily as needed for indigestion or heartburn.      . calcium-vitamin D (OSCAL WITH D) 500-200 MG-UNIT per tablet Take 1 tablet by mouth daily.      Marland Kitchen dicyclomine (BENTYL) 10 MG capsule Take 10 mg by mouth at bedtime.      . ferrous sulfate 325 (65 FE) MG tablet Take 325 mg by mouth every other day.      . fexofenadine (ALLEGRA) 180 MG tablet Take 180 mg by mouth daily as needed for allergies or rhinitis.      . hydrocortisone (ANUSOL-HC) 2.5 % rectal cream Place 1 application rectally daily as needed.      . latanoprost (XALATAN) 0.005 % ophthalmic solution Place 1 drop into both eyes at bedtime.      Marland Kitchen levothyroxine (SYNTHROID, LEVOTHROID) 75 MCG tablet Take 1 tablet (75 mcg total) by mouth daily before breakfast.  90 tablet  1  . lisinopril (PRINIVIL,ZESTRIL) 40 MG tablet Take 1 tablet (40 mg total) by mouth daily.  90 tablet  1  . LORazepam (ATIVAN) 0.5 MG tablet Take 0.5 mg by mouth at bedtime as needed for anxiety.      . lovastatin (MEVACOR) 20 MG tablet Take 1 tablet (20 mg total) by mouth every evening.  90 tablet  1  . Multiple Vitamins-Minerals (ICAPS LUTEIN & OMEGA-3) CAPS Take 1 capsule by mouth daily.      . QUEtiapine (SEROQUEL) 100 MG tablet Take 100 mg by mouth at bedtime.      . sertraline (ZOLOFT) 100 MG tablet Take one tablet by mouth once daily  90 tablet  3  . vitamin B-12 (CYANOCOBALAMIN) 100 MCG tablet Take 100 mcg by mouth daily.      . fluconazole (DIFLUCAN) 150 MG tablet Take 1 tablet (150 mg total) by mouth once. Daily for 3 days  3 tablet  1    Home: Home Living Family/patient expects to be discharged to:: Unsure Living Arrangements:  Alone Available Help at Discharge: Other (Comment) (lives alone) Type of Home: Independent living facility Home Equipment: Dan Humphreys - 2 wheels;Shower seat  Functional History: Prior Function Level of Independence: Independent with assistive device(s) Comments: Used walker for ambulation. Independent with ADLs Functional Status:  Mobility: Bed Mobility Overal bed mobility: Needs Assistance Bed Mobility: Supine to Sit Supine to sit: Mod assist;HOB elevated General bed mobility comments: Mod assist for LLE support and use of bed pad to scoot patient to edge of bed. Heavy use of bed rail and able to push trunk to seated position. Transfers Overall transfer level: Needs assistance Equipment used: Rolling walker (2 wheeled) Transfers: Sit to/from [employer] Sit to Stand: Mod assist Stand pivot transfers: Mod assist General transfer comment: Mod assist for boost  x2 from lowest bed setting. VC for hand placement and forward weight shift. Assist needed for RW control with education for safe use. able to take small steps for pivot with VC for placement. Poor control with descent requiring assistance.      ADL:    Cognition: Cognition Overall Cognitive Status: No family/caregiver present to determine baseline cognitive functioning Orientation Level: Oriented to person;Disoriented to place;Disoriented to time;Disoriented to situation Cognition Arousal/Alertness: Awake/alert Behavior During Therapy: Restless;Agitated Overall Cognitive Status: No family/caregiver present to determine baseline cognitive functioning  Blood pressure 127/49, pulse 90, temperature 98 F (36.7 C), temperature source Oral, resp. rate 16, height 5\' 3"  (1.6 m), weight 49.442 kg (109 lb), SpO2 95.00%. Physical Exam  Vitals reviewed. Constitutional:  80 year old female  HENT:  Head: Normocephalic.  Eyes: EOM are normal.  Neck: Normal range of motion. Neck supple. No thyromegaly present.   Cardiovascular: Normal rate and regular rhythm.   Respiratory: Effort normal and breath sounds normal. No respiratory distress.  GI: Soft. Bowel sounds are normal. She exhibits no distension.  Neurological: She is alert.  Patient makes good eye contact with examiner. She needed multiple cues to name place and very limited medical historian. She was able to provide her age and date of birth. Poor attention. Confused. Limited movement left lower extremity  Skin:  Hip incision clean and dry appropriately tender    Results for orders placed during the hospital encounter of 06/25/14 (from the past 24 hour(s))  CBC     Status: Abnormal   Collection Time    06/28/14  8:36 AM      Result Value Ref Range   WBC 6.9  4.0 - 10.5 K/uL   RBC 3.68 (*) 3.87 - 5.11 MIL/uL   Hemoglobin 10.6 (*) 12.0 - 15.0 g/dL   HCT 06/30/14 (*) 21.2 - 24.8 %   MCV 85.3  78.0 - 100.0 fL   MCH 28.8  26.0 - 34.0 pg   MCHC 33.8  30.0 - 36.0 g/dL   RDW 25.0 (*) 03.7 - 04.8 %   Platelets 129 (*) 150 - 400 K/uL  CBC     Status: Abnormal   Collection Time    06/29/14  5:12 AM      Result Value Ref Range   WBC 5.7  4.0 - 10.5 K/uL   RBC 2.94 (*) 3.87 - 5.11 MIL/uL   Hemoglobin 8.6 (*) 12.0 - 15.0 g/dL   HCT 07/01/14 (*) 16.9 - 45.0 %   MCV 86.1  78.0 - 100.0 fL   MCH 29.3  26.0 - 34.0 pg   MCHC 34.0  30.0 - 36.0 g/dL   RDW 38.8 (*) 82.8 - 00.3 %   Platelets 146 (*) 150 - 400 K/uL   Dg Thoracic Spine W/swimmers  06/27/2014   CLINICAL DATA:  80 year old female status post fall with recent left hip fracture. Pain. Initial encounter.  EXAM: THORACIC SPINE - 2 VIEW + SWIMMERS  COMPARISON:  Lumbar series from the same day reported separately. Chest CT 03/25/2008.  FINDINGS: Extensive calcified atherosclerosis of the aorta. Osteopenia. Normal thoracic segmentation. Levo convex thoracolumbar scoliosis. Allowing for the scoliosis on the cross-table lateral view, no thoracic compression deformity identified. Thoracic vertebral height  and alignment appears stable. Cervicothoracic junction alignment is within normal limits.  IMPRESSION: No definite thoracic spine fracture; osteopenia and scoliosis. If occult fracture is suspected, thoracic MRI or whole-body bone scan would be most sensitive.   Electronically Signed   By: 03/27/2008  Margo Aye M.D.   On: 06/27/2014 20:17   Dg Lumbar Spine Complete  06/27/2014   CLINICAL DATA:  80 year old female with back pain. Recent fall with left hip fracture. Initial encounter.  EXAM: LUMBAR SPINE - COMPLETE 4+ VIEW  COMPARISON:  06/25/2014 lumbar radiographs and earlier.  FINDINGS: Extensive Aortoiliac calcified atherosclerosis noted. Lumbar vertebral height and alignment are stable, with levoconvex scoliosis again noted. Staple line now overlies the S1 level on the lateral view. Grossly intact visualized lower thoracic levels. Facet degeneration. Osteopenia. Partially visible proximal left femur hardware. Grossly intact sacrum.  IMPRESSION: No acute fracture or listhesis identified in the lumbar spine. If there is suspicion for occult fracture, lumbar MRI or nuclear medicine whole-body bone scan would be most sensitive.   Electronically Signed   By: Augusto Gamble M.D.   On: 06/27/2014 20:12    Assessment/Plan: 1. Diagnosis: left IT fracture 2. Does the need for close, 24 hr/day medical supervision in concert with the patient's rehab needs make it unreasonable for this patient to be served in a less intensive setting? No 3. Co-Morbidities requiring supervision/potential complications: tachycardia 4. Due to bladder management, bowel management, safety, skin/wound care, disease management, medication administration, pain management and patient education, does the patient require 24 hr/day rehab nursing? No 5. Does the patient require coordinated care of a physician, rehab nurse, PT, OT,  to address physical and functional deficits in the context of the above medical diagnosis(es)? N/A Addressing deficits in the  following areas: balance, endurance, locomotion, strength, transferring, bowel/bladder control, bathing and dressing 6. Can the patient actively participate in an intensive therapy program of at least 3 hrs of therapy per day at least 5 days per week? No 7. The potential for patient to make measurable gains while on inpatient rehab is excellent 8. Anticipated functional outcomes upon discharge from inpatient rehab are n/a  with PT, n/a with OT, n/a with SLP. 9. Estimated rehab length of stay to reach the above functional goals is:   10. Does the patient have adequate social supports to accommodate these discharge functional goals? No 11. Anticipated D/C setting: Other 12. Anticipated post D/C treatments: N/A 13. Overall Rehab/Functional Prognosis: fair  RECOMMENDATIONS: This patient's condition is appropriate for continued rehabilitative care in the following setting: SNF Patient has agreed to participate in recommended program. Yes Note that insurance prior authorization may be required for reimbursement for recommended care.  Comment: no dispo, confused---SNF  Ranelle Oyster, MD, [facility] Physical Medicine & Rehabilitation     06/29/2014

## 2014-06-29 NOTE — Progress Notes (Signed)
Pt d/c to [facility] via [facility].  Pt and her daughter informed and agreeable to tx.

## 2014-06-29 NOTE — Discharge Summary (Signed)
Physician Discharge Summary  Jane [name] MRN: [id number] DOB/AGE: [birth year] 80 y.o.  PCP: Hollace Kinnier, DO   Admit date: 06/25/2014 Discharge date: 06/29/2014  Discharge Diagnoses:  Acute blood loss anemia   Closed left hip fracture Active Problems:   Essential hypertension, benign   Hip fracture   Tachycardia   Hypothyroidism due to acquired atrophy of thyroid   Chronic obstructive asthma, unspecified   Vaginal irritation   Localized skin mass, lump, or swelling  Followup recommendations Follow up with PCP in 5-7 days follow up with orthopedics as recommended Follow up CBC and BMP in one week      Medication List    STOP taking these medications       fluconazole 150 MG tablet  Commonly known as:  DIFLUCAN     lisinopril 40 MG tablet  Commonly known as:  PRINIVIL,ZESTRIL      TAKE these medications       acetaminophen 650 MG CR tablet  Commonly known as:  TYLENOL  Take 650 mg by mouth every 8 (eight) hours as needed for pain.     albuterol 108 (90 BASE) MCG/ACT inhaler  Commonly known as:  PROVENTIL HFA;VENTOLIN HFA  Inhale 2 puffs into the lungs every 6 (six) hours as needed. Shortness of breath.     amLODipine 5 MG tablet  Commonly known as:  NORVASC  Take 1 tablet (5 mg total) by mouth daily.     calcium carbonate 500 MG chewable tablet  Commonly known as:  TUMS - dosed in mg elemental calcium  Chew 1 tablet by mouth daily as needed for indigestion or heartburn.     calcium-vitamin D 500-200 MG-UNIT per tablet  Commonly known as:  OSCAL WITH D  Take 1 tablet by mouth daily.     dicyclomine 10 MG capsule  Commonly known as:  BENTYL  Take 10 mg by mouth at bedtime.     enoxaparin 40 MG/0.4ML injection  Commonly known as:  LOVENOX  Inject 0.4 mLs (40 mg total) into the skin daily.     ferrous sulfate 325 (65 FE) MG tablet  Take 325 mg by mouth every other day.     fexofenadine 180 MG tablet  Commonly known as:  ALLEGRA  Take 180 mg  by mouth daily as needed for allergies or rhinitis.     HYDROcodone-acetaminophen 5-325 MG per tablet  Commonly known as:  NORCO  Take 1-2 tablets by mouth every 6 (six) hours as needed.     hydrocortisone 2.5 % rectal cream  Commonly known as:  ANUSOL-HC  Place 1 application rectally daily as needed.     ICAPS LUTEIN & OMEGA-3 Caps  Take 1 capsule by mouth daily.     latanoprost 0.005 % ophthalmic solution  Commonly known as:  XALATAN  Place 1 drop into both eyes at bedtime.     levothyroxine 75 MCG tablet  Commonly known as:  SYNTHROID, LEVOTHROID  Take 1 tablet (75 mcg total) by mouth daily before breakfast.     LORazepam 0.5 MG tablet  Commonly known as:  ATIVAN  Take 0.5 mg by mouth at bedtime as needed for anxiety.     lovastatin 20 MG tablet  Commonly known as:  MEVACOR  Take 1 tablet (20 mg total) by mouth every evening.     polyethylene glycol packet  Commonly known as:  MIRALAX / GLYCOLAX  Take 17 g by mouth daily as needed for mild constipation.  QUEtiapine 100 MG tablet  Commonly known as:  SEROQUEL  Take 100 mg by mouth at bedtime.     sertraline 100 MG tablet  Commonly known as:  ZOLOFT  Take one tablet by mouth once daily     vitamin B-12 100 MCG tablet  Commonly known as:  CYANOCOBALAMIN  Take 100 mcg by mouth daily.        Discharge Condition:  Stable  Disposition: 03-Skilled Nursing Facility   Consults: Orthopedics  Significant Diagnostic Studies: Dg Chest 1 View  06/25/2014   CLINICAL DATA:  Fall, pain  EXAM: CHEST - 1 VIEW  COMPARISON:  01/26/2014  FINDINGS: Lungs are hyperinflated compatible with COPD/ emphysema. Heart is enlarged. No superimposed CHF. Resolved right lower lobe pneumonia compared to 01/26/2014. No current airspace process, edema, collapse or consolidation. No large effusion or pneumothorax. Atherosclerosis of the aorta. Right lower lobe granulomatous disease suspected.  IMPRESSION: COPD/ emphysema.  Hyperinflation.   Resolved right lower lobe pneumonia.   Electronically Signed   By: Daryll Brod M.D.   On: 06/25/2014 12:29   Dg Thoracic Spine W/swimmers  06/27/2014   CLINICAL DATA:  80 year old female status post fall with recent left hip fracture. Pain. Initial encounter.  EXAM: THORACIC SPINE - 2 VIEW + SWIMMERS  COMPARISON:  Lumbar series from the same day reported separately. Chest CT 03/25/2008.  FINDINGS: Extensive calcified atherosclerosis of the aorta. Osteopenia. Normal thoracic segmentation. Levo convex thoracolumbar scoliosis. Allowing for the scoliosis on the cross-table lateral view, no thoracic compression deformity identified. Thoracic vertebral height and alignment appears stable. Cervicothoracic junction alignment is within normal limits.  IMPRESSION: No definite thoracic spine fracture; osteopenia and scoliosis. If occult fracture is suspected, thoracic MRI or whole-body bone scan would be most sensitive.   Electronically Signed   By: Lars Pinks M.D.   On: 06/27/2014 20:17   Dg Lumbar Spine Complete  06/27/2014   CLINICAL DATA:  80 year old female with back pain. Recent fall with left hip fracture. Initial encounter.  EXAM: LUMBAR SPINE - COMPLETE 4+ VIEW  COMPARISON:  06/25/2014 lumbar radiographs and earlier.  FINDINGS: Extensive Aortoiliac calcified atherosclerosis noted. Lumbar vertebral height and alignment are stable, with levoconvex scoliosis again noted. Staple line now overlies the S1 level on the lateral view. Grossly intact visualized lower thoracic levels. Facet degeneration. Osteopenia. Partially visible proximal left femur hardware. Grossly intact sacrum.  IMPRESSION: No acute fracture or listhesis identified in the lumbar spine. If there is suspicion for occult fracture, lumbar MRI or nuclear medicine whole-body bone scan would be most sensitive.   Electronically Signed   By: Lars Pinks M.D.   On: 06/27/2014 20:12   Dg Lumbar Spine Complete  06/25/2014   CLINICAL DATA:  Fall, left hip  pain  EXAM: LUMBAR SPINE - COMPLETE 4+ VIEW  COMPARISON:  Concurrently obtained radiographs of the pelvis and left femur  FINDINGS: The bones are osteopenic. There is multilevel degenerative disease throughout the lumbar spine. Degenerative levoconvex scoliosis centered at L2. Atherosclerotic vascular calcifications are present throughout the aortoiliac system.  No acute fracture or malalignment.  IMPRESSION: 1. No acute fracture or malalignment. 2. Aortoiliac atherosclerosis. 3. Osteopenia. 4. Multilevel degenerative disc disease with degenerative levoconvex scoliosis centered at L2.   Electronically Signed   By: Jacqulynn Cadet M.D.   On: 06/25/2014 12:48   Dg Pelvis 1-2 Views  06/25/2014   CLINICAL DATA:  Fall, left hip pain  EXAM: PELVIS - 1-2 VIEW  COMPARISON:  Concurrently obtained radiographs of  the left femur; Prior radiographs of the pelvis 01/27/2014  FINDINGS: Subtle disruption of the cortical surface in the mid aspect of the lesser trochanter and also along the intertrochanteric line. Although no definite fracture is identified, a nondisplaced intertrochanteric fracture is strongly suspected. The remainder of the visualized bony pelvis appears intact. Surgical changes of prior ORIF of a now healed right intertrochanteric fracture. Multilevel degenerative change in the lumbar spine. The bones are osteopenic. Atherosclerotic vascular calcifications.  IMPRESSION: 1. Strongly suspect nondisplaced left intertrochanteric fracture. If further imaging is warranted for confirmation, MRI of the pelvis is recommended given the patient's underlying osteopenia. 2. Surgical changes of prior ORIF of a now healed right intertrochanteric fracture. 3. Atherosclerotic vascular calcifications.   Electronically Signed   By: Jacqulynn Cadet M.D.   On: 06/25/2014 12:44   Dg Hip Operative Left  06/26/2014   CLINICAL DATA:  Left hip fracture.  EXAM: OPERATIVE LEFT HIP  COMPARISON:  Pelvis radiograph 06/25/2014   FINDINGS: Four intraoperative spot fluoroscopic images of the left femur are provided. These demonstrate interval internal fixation of the previously described intertrochanteric fracture with an anterograde intramedullary nail and 2 proximal screws. The lesser trochanter fracture is mildly displaced medially. The femoral head remains approximated with the acetabulum. Vascular calcification is noted.  IMPRESSION: Intraoperative images during internal fixation of intertrochanteric fracture of the left femur.   Electronically Signed   By: Logan Bores   On: 06/26/2014 09:27   Dg Femur Left  06/25/2014   CLINICAL DATA:  Fall  EXAM: LEFT FEMUR - 2 VIEW  COMPARISON:  Concurrently obtained radiographs of the pelvis  FINDINGS: On the lateral view, there is a lucency in the intertrochanteric region of the femur. On the frontal view, the lucency is not well seen. I strongly suspect a nondisplaced and nearly occult intertrochanteric fracture. No knee joint effusion. Atherosclerotic calcifications present throughout the common femoral, superficial femoral and popliteal arteries.  IMPRESSION: 1. Suspect nearly occult, nondisplaced intertrochanteric fracture. A lucency can be seen within the intertrochanteric region on the cross-table lateral view. Recommend further evaluation with MRI of the pelvis for confirmation. 2. Atherosclerotic vascular calcifications.   Electronically Signed   By: Jacqulynn Cadet M.D.   On: 06/25/2014 12:41   Ct Head Wo Contrast  06/25/2014   CLINICAL DATA:  Fall  EXAM: CT HEAD WITHOUT CONTRAST  TECHNIQUE: Contiguous axial images were obtained from the base of the skull through the vertex without intravenous contrast.  COMPARISON:  None.  FINDINGS: Negative for acute intracranial hemorrhage, acute infarction, mass, mass effect, hydrocephalus or midline shift. Gray-white differentiation is preserved throughout. Advanced, confluent periventricular and subcortical white matter hypoattenuation which  is nonspecific but most suggestive of the sequela of longstanding microvascular ischemic white matter disease. Remote lacunar infarct versus dilated perivascular space in the right subinsular cortex. No focal scalp hematoma or calvarial fracture. Bilateral lens extractions. Normal aeration of the mastoid air cells and visualized paranasal sinuses. Atherosclerotic calcifications noted in both cavernous carotid arteries.  IMPRESSION: 1. No acute intracranial abnormality. 2. Advanced sequelae of chronic microvascular ischemic white matter disease. 3. Remote lacunar infarct versus dilated perivascular space in the right subinsular cortex. 4. Intracranial atherosclerosis.   Electronically Signed   By: Jacqulynn Cadet M.D.   On: 06/25/2014 12:51   US Pelvis Limited  06/22/2014   CLINICAL DATA:  Tender right buttock mass.  EXAM: US PELVIS LIMITED  TECHNIQUE: Ultrasound examination of the pelvic soft tissues was performed in the area of clinical  concern.  COMPARISON:  None.  FINDINGS: There is a poorly marginated 4.2 x 5.1 x 1.7 cm inhomogeneous soft tissue mass in the subcutaneous fat of the right buttock just to the right of the sacrum. There is some blood flow within this area. The appearance is not consistent with a benign lipoma.  IMPRESSION: Poorly defined soft tissue mass in the right buttock. The appearance is nonspecific. I suspect this is a chronic inflammatory process. There is a subtle area of abnormality in the overlying skin suggesting there may have been a prior infection at this site. The patient denies having a known prior sacral ulcer.  The patient also reports that the size of the lesion fluctuates.  This could be further assessed and better characterized with soft tissue MRI with and without contrast if the lesion enlarges or persists.   Electronically Signed   By: Rozetta Nunnery M.D.   On: 06/22/2014 15:09   Ct Hip Left Wo Contrast  06/25/2014   CLINICAL DATA:  Severe left lateral hip pain  secondary to a fall today.  EXAM: CT OF THE LEFT HIP WITHOUT CONTRAST  TECHNIQUE: Multidetector CT imaging was performed according to the standard protocol. Multiplanar CT image reconstructions were also generated.  COMPARISON:  Radiographs dated 06/25/2014  FINDINGS: There is a comminuted intertrochanteric fracture of the proximal left femur with slight impaction. The greater and lesser trochanters are fragmented. Femoral head is intact. No dislocation.  Pelvic bones are intact. Severe degenerative changes in the lower lumbar spine. Intra medullary rod and compression screw in the right hip. The fracture has healed on the right.  IMPRESSION: Comminuted intertrochanteric fracture of the proximal left femur as described.   Electronically Signed   By: Rozetta Nunnery M.D.   On: 06/25/2014 15:08    2-D echo LV EF: 55% - 60%  ------------------------------------------------------------------- Indications: Syncope 780.2.  ------------------------------------------------------------------- History: Risk factors: Hypertension.  ------------------------------------------------------------------- Study Conclusions  - Left ventricle: The cavity size was normal. There was mild focal basal hypertrophy of the septum. Systolic function was normal. The estimated ejection fraction was in the range of 55% to 60%.     Microbiology: Recent Results (from the past 240 hour(s))  SURGICAL PCR SCREEN     Status: None   Collection Time    06/26/14  5:15 AM      Result Value Ref Range Status   MRSA, PCR NEGATIVE  NEGATIVE Final   Staphylococcus aureus NEGATIVE  NEGATIVE Final   Comment:            The Xpert SA Assay (FDA     approved for NASAL specimens     in patients over 91 years of age),     is one component of     a comprehensive surveillance     program.  Test performance has     been validated by [employer] for patients greater     than or equal to 78 year old.     It is not intended     to  diagnose infection nor to     guide or monitor treatment.     Labs: Results for orders placed during the hospital encounter of 06/25/14 (from the past 48 hour(s))  COMPREHENSIVE METABOLIC PANEL     Status: Abnormal   Collection Time    06/28/14  4:04 AM      Result Value Ref Range   Sodium 139  137 - 147 mEq/L   Potassium  3.5 (*) 3.7 - 5.3 mEq/L   Chloride 103  96 - 112 mEq/L   CO2 25  19 - 32 mEq/L   Glucose, Bld 98  70 - 99 mg/dL   BUN 14  6 - 23 mg/dL   Creatinine, Ser 0.71  0.50 - 1.10 mg/dL   Calcium 8.3 (*) 8.4 - 10.5 mg/dL   Total Protein 5.0 (*) 6.0 - 8.3 g/dL   Albumin 2.8 (*) 3.5 - 5.2 g/dL   AST 16  0 - 37 U/L   ALT <5  0 - 35 U/L   Alkaline Phosphatase 47  39 - 117 U/L   Total Bilirubin 0.5  0.3 - 1.2 mg/dL   GFR calc non Af Amer 76 (*) >90 mL/min   GFR calc Af Amer 88 (*) >90 mL/min   Comment: (NOTE)     The eGFR has been calculated using the CKD EPI equation.     This calculation has not been validated in all clinical situations.     eGFR's persistently <90 mL/min signify possible Chronic Kidney     Disease.   Anion gap 11  5 - 15  CBC     Status: Abnormal   Collection Time    06/28/14  8:36 AM      Result Value Ref Range   WBC 6.9  4.0 - 10.5 K/uL   RBC 3.68 (*) 3.87 - 5.11 MIL/uL   Hemoglobin 10.6 (*) 12.0 - 15.0 g/dL   Comment: POST TRANSFUSION SPECIMEN   HCT 31.4 (*) 36.0 - 46.0 %   MCV 85.3  78.0 - 100.0 fL   Comment: DELTA CHECK NOTED     REPEATED TO VERIFY     VALLED TO TONYA BEAL RN AT [id number] 06/28/14 BY WOOLLENK   MCH 28.8  26.0 - 34.0 pg   MCHC 33.8  30.0 - 36.0 g/dL   RDW 17.3 (*) 11.5 - 15.5 %   Platelets 129 (*) 150 - 400 K/uL   Comment: REPEATED TO VERIFY  CBC     Status: Abnormal   Collection Time    06/29/14  5:12 AM      Result Value Ref Range   WBC 5.7  4.0 - 10.5 K/uL   RBC 2.94 (*) 3.87 - 5.11 MIL/uL   Hemoglobin 8.6 (*) 12.0 - 15.0 g/dL   Comment: REPEATED TO VERIFY     SPECIMEN CHECKED FOR CLOTS   HCT 25.3 (*) 36.0 - 46.0 %    MCV 86.1  78.0 - 100.0 fL   MCH 29.3  26.0 - 34.0 pg   MCHC 34.0  30.0 - 36.0 g/dL   RDW 16.6 (*) 11.5 - 15.5 %   Platelets 146 (*) 150 - 400 K/uL     HPI : Jane [name] is a 80 y.o. female who complains of left hip fx s/p mechanical fall at home. Lives independently at assisted living facility. Had hip fx in march 2015 fixed by Dr. Percell Miller. Was doing well and back to her old routine when this happened. Walks with walker at baseline. Denies syncope, LOC, neck pain, abd pain. Ortho consulted for hip fx  HOSPITAL COURSE: * Left Hip Fracture.  Non displaced. Status post surgery 8/21  Dr. Erlinda Hong consulted. CT Pelvis shows Comminuted intertrochanteric fracture of the proximal left femur  Management per orthopedics.  Discontinued Lovenox for DVT prophylaxis because of anemia, placed on SCDs but restarted for 14 more days WBAT left lower extremity  - no signs of hematoma  or unexpected postop bleeding Vicodin for pain control Will need an outpatient bone density scan  Vitamin D. level is normal  Possible discharged to CIR-versus SNF discharge  Back pain  X-rays of the thoracic and the lumbar spine do not show any fracture did show osteopenia and scoliosis  Anemia, acute blood loss anemia  Significant drop in hemoglobin from 11.7-6.3 , hemoglobin 10.6 after 2 units on 8/22  Have again trended down to 8.6 Repeat CBC in one week Lovenox resumed cautiously for another 2 weeks for DVT prophylaxis  HTN blood pressure dropped postoperatively Discontinued lisinopril , continue Norvasc Can resume lisinopril if her blood pressure continues to start increasing Minimize narcotics  Dizziness with Fall  Patient reports she is "always dizzy" daughter reports this is inaccurate.  Orthostatic vital signs negative.  2D Echo shows EF of 50-60%, LV normal in size  PT / OT recommends SNF.   Hypothyroidism  Continue synthroid , TSH and 8/6 was 3.05   Hyperlipidemia  Continue statin.   Skin mass on  buttocks  Being worked up outpatient  Thought to be from previously healed sacral pressure ulceration   Vaginal irritation/vaginitis.  Hold Diflucan.  Continue outpatient work up.   COPD  Mild hypoxia on admission (this may be her norm)  Placed on o2 N/C PRN  Appears stable.  Will continue prn albuterol.         Discharge Exam  Blood pressure 127/49, pulse 90, temperature 98 F (36.7 C), temperature source Oral, resp. rate 18, height _0  (1.6 m), weight 49.442 kg (109 lb), SpO2 96.00%. General: alert & oriented x 3 In NAD  Cardiovascular: RRR, nl S1 s2  Respiratory: Decreased breath sounds at the bases, scattered rhonchi, no crackles  Abdomen: soft +BS NT/ND, no masses palpable  Extremities: No cyanosis and no edema         Discharge Instructions   Diet - low sodium heart healthy    Complete by:  As directed      Increase activity slowly    Complete by:  As directed      Weight bearing as tolerated    Complete by:  As directed            Follow-up Information   Follow up with Marianna Payment, MD In 2 weeks. (For suture removal, For wound re-check)    Specialty:  Orthopedic Surgery   Contact information:   [address] [place] [id number] [phone]       Follow up with REED, TIFFANY, DO. Schedule an appointment as soon as possible for a visit in 1 week.   Specialty:  Geriatric Medicine   Contact information:   [address] [place] [address] [phone]       Signed: Reyne Dumas 06/29/2014, 12:58 PM

## 2014-06-29 NOTE — Care Management Note (Signed)
CARE MANAGEMENT NOTE 06/29/2014  Patient:  Jane [name], Jane [name]   Account Number:  [id number]  Date Initiated:  06/29/2014  Documentation initiated by:  Vance Peper  Subjective/Objective Assessment:   80 yr old female admitted with left hip fracture, s/p left hip ORIF.i     Action/Plan:   Patient lives at [facility], will go to [facility] for shortterm rehab. Social worker is aware.   Anticipated DC Date:  06/29/2014   Anticipated DC Plan:  SKILLED NURSING FACILITY  In-house referral  Clinical Social Worker      DC Planning Services  CM consult      [facility] Choice  NA   Choice offered to / List presented to:     DME arranged  NA        HH arranged  NA      Status of service:  Completed, signed off Medicare Important Message given?  YES (If response is "NO", the following Medicare IM given date fields will be blank) Date Medicare IM given:  06/29/2014 Medicare IM given by:  Vance Peper Date Additional Medicare IM given:   Additional Medicare IM given by:    Discharge Disposition:  SKILLED NURSING FACILITY  Per UR Regulation:  Reviewed for med. necessity/level of care/duration of stay

## 2014-07-01 DIAGNOSIS — E039 Hypothyroidism, unspecified: Secondary | ICD-10-CM | POA: Diagnosis not present

## 2014-07-01 DIAGNOSIS — J449 Chronic obstructive pulmonary disease, unspecified: Secondary | ICD-10-CM | POA: Diagnosis not present

## 2014-07-01 DIAGNOSIS — S72009D Fracture of unspecified part of neck of unspecified femur, subsequent encounter for closed fracture with routine healing: Secondary | ICD-10-CM | POA: Diagnosis not present

## 2014-07-01 DIAGNOSIS — I10 Essential (primary) hypertension: Secondary | ICD-10-CM | POA: Diagnosis not present

## 2014-07-01 DIAGNOSIS — F0390 Unspecified dementia without behavioral disturbance: Secondary | ICD-10-CM | POA: Diagnosis not present

## 2014-07-05 DIAGNOSIS — J441 Chronic obstructive pulmonary disease with (acute) exacerbation: Secondary | ICD-10-CM | POA: Diagnosis not present

## 2014-07-05 DIAGNOSIS — E039 Hypothyroidism, unspecified: Secondary | ICD-10-CM | POA: Diagnosis not present

## 2014-07-16 DIAGNOSIS — S72143A Displaced intertrochanteric fracture of unspecified femur, initial encounter for closed fracture: Secondary | ICD-10-CM | POA: Diagnosis not present

## 2014-07-17 NOTE — Progress Notes (Signed)
I have reviewed the following note and agree with the updated plan of care.  [address] [place], [place] [phone]

## 2014-07-20 DIAGNOSIS — N760 Acute vaginitis: Secondary | ICD-10-CM | POA: Diagnosis not present

## 2014-07-20 DIAGNOSIS — R109 Unspecified abdominal pain: Secondary | ICD-10-CM | POA: Diagnosis not present

## 2014-08-13 DIAGNOSIS — S72142D Displaced intertrochanteric fracture of left femur, subsequent encounter for closed fracture with routine healing: Secondary | ICD-10-CM | POA: Diagnosis not present

## 2014-08-17 ENCOUNTER — Ambulatory Visit: Payer: Medicare Other | Admitting: Internal Medicine

## 2014-08-24 ENCOUNTER — Other Ambulatory Visit: Payer: Self-pay | Admitting: Internal Medicine

## 2014-08-24 NOTE — Telephone Encounter (Signed)
[facility]

## 2014-08-25 DIAGNOSIS — Z9181 History of falling: Secondary | ICD-10-CM

## 2014-08-25 DIAGNOSIS — R2681 Unsteadiness on feet: Secondary | ICD-10-CM

## 2014-08-25 DIAGNOSIS — S72041S Displaced fracture of base of neck of right femur, sequela: Secondary | ICD-10-CM

## 2014-08-25 DIAGNOSIS — I10 Essential (primary) hypertension: Secondary | ICD-10-CM

## 2014-08-25 DIAGNOSIS — S72042D Displaced fracture of base of neck of left femur, subsequent encounter for closed fracture with routine healing: Secondary | ICD-10-CM

## 2014-08-25 DIAGNOSIS — J449 Chronic obstructive pulmonary disease, unspecified: Secondary | ICD-10-CM

## 2014-08-27 ENCOUNTER — Telehealth: Payer: Self-pay | Admitting: *Deleted

## 2014-08-27 DIAGNOSIS — R2681 Unsteadiness on feet: Secondary | ICD-10-CM | POA: Diagnosis not present

## 2014-08-27 DIAGNOSIS — I10 Essential (primary) hypertension: Secondary | ICD-10-CM | POA: Diagnosis not present

## 2014-08-27 DIAGNOSIS — S72042D Displaced fracture of base of neck of left femur, subsequent encounter for closed fracture with routine healing: Secondary | ICD-10-CM | POA: Diagnosis not present

## 2014-08-27 DIAGNOSIS — S72041S Displaced fracture of base of neck of right femur, sequela: Secondary | ICD-10-CM | POA: Diagnosis not present

## 2014-08-27 DIAGNOSIS — Z9181 History of falling: Secondary | ICD-10-CM | POA: Diagnosis not present

## 2014-08-27 DIAGNOSIS — J449 Chronic obstructive pulmonary disease, unspecified: Secondary | ICD-10-CM | POA: Diagnosis not present

## 2014-08-27 NOTE — Telephone Encounter (Signed)
Jane [name] with [name] called and stated that patient was released from the Hospital after Hip Fracture. Patient to continue following up with [place]. He is doing PT with patient and needed verbal orders to continue. Given.

## 2014-08-28 DIAGNOSIS — Z9181 History of falling: Secondary | ICD-10-CM | POA: Diagnosis not present

## 2014-08-28 DIAGNOSIS — S72041S Displaced fracture of base of neck of right femur, sequela: Secondary | ICD-10-CM | POA: Diagnosis not present

## 2014-08-28 DIAGNOSIS — S72042D Displaced fracture of base of neck of left femur, subsequent encounter for closed fracture with routine healing: Secondary | ICD-10-CM | POA: Diagnosis not present

## 2014-08-28 DIAGNOSIS — J449 Chronic obstructive pulmonary disease, unspecified: Secondary | ICD-10-CM | POA: Diagnosis not present

## 2014-08-28 DIAGNOSIS — I10 Essential (primary) hypertension: Secondary | ICD-10-CM | POA: Diagnosis not present

## 2014-08-28 DIAGNOSIS — R2681 Unsteadiness on feet: Secondary | ICD-10-CM | POA: Diagnosis not present

## 2014-09-01 DIAGNOSIS — S72041S Displaced fracture of base of neck of right femur, sequela: Secondary | ICD-10-CM | POA: Diagnosis not present

## 2014-09-01 DIAGNOSIS — S72042D Displaced fracture of base of neck of left femur, subsequent encounter for closed fracture with routine healing: Secondary | ICD-10-CM | POA: Diagnosis not present

## 2014-09-01 DIAGNOSIS — I10 Essential (primary) hypertension: Secondary | ICD-10-CM | POA: Diagnosis not present

## 2014-09-01 DIAGNOSIS — J449 Chronic obstructive pulmonary disease, unspecified: Secondary | ICD-10-CM | POA: Diagnosis not present

## 2014-09-01 DIAGNOSIS — Z9181 History of falling: Secondary | ICD-10-CM | POA: Diagnosis not present

## 2014-09-01 DIAGNOSIS — R2681 Unsteadiness on feet: Secondary | ICD-10-CM | POA: Diagnosis not present

## 2014-09-02 DIAGNOSIS — I10 Essential (primary) hypertension: Secondary | ICD-10-CM | POA: Diagnosis not present

## 2014-09-02 DIAGNOSIS — S72041S Displaced fracture of base of neck of right femur, sequela: Secondary | ICD-10-CM | POA: Diagnosis not present

## 2014-09-02 DIAGNOSIS — R2681 Unsteadiness on feet: Secondary | ICD-10-CM | POA: Diagnosis not present

## 2014-09-02 DIAGNOSIS — J449 Chronic obstructive pulmonary disease, unspecified: Secondary | ICD-10-CM | POA: Diagnosis not present

## 2014-09-02 DIAGNOSIS — Z9181 History of falling: Secondary | ICD-10-CM | POA: Diagnosis not present

## 2014-09-02 DIAGNOSIS — S72042D Displaced fracture of base of neck of left femur, subsequent encounter for closed fracture with routine healing: Secondary | ICD-10-CM | POA: Diagnosis not present

## 2014-09-03 DIAGNOSIS — S72041S Displaced fracture of base of neck of right femur, sequela: Secondary | ICD-10-CM | POA: Diagnosis not present

## 2014-09-03 DIAGNOSIS — J449 Chronic obstructive pulmonary disease, unspecified: Secondary | ICD-10-CM | POA: Diagnosis not present

## 2014-09-03 DIAGNOSIS — R2681 Unsteadiness on feet: Secondary | ICD-10-CM | POA: Diagnosis not present

## 2014-09-03 DIAGNOSIS — I10 Essential (primary) hypertension: Secondary | ICD-10-CM | POA: Diagnosis not present

## 2014-09-03 DIAGNOSIS — Z9181 History of falling: Secondary | ICD-10-CM | POA: Diagnosis not present

## 2014-09-03 DIAGNOSIS — S72042D Displaced fracture of base of neck of left femur, subsequent encounter for closed fracture with routine healing: Secondary | ICD-10-CM | POA: Diagnosis not present

## 2014-09-04 DIAGNOSIS — S72042D Displaced fracture of base of neck of left femur, subsequent encounter for closed fracture with routine healing: Secondary | ICD-10-CM | POA: Diagnosis not present

## 2014-09-04 DIAGNOSIS — S72041S Displaced fracture of base of neck of right femur, sequela: Secondary | ICD-10-CM | POA: Diagnosis not present

## 2014-09-04 DIAGNOSIS — Z9181 History of falling: Secondary | ICD-10-CM | POA: Diagnosis not present

## 2014-09-04 DIAGNOSIS — J449 Chronic obstructive pulmonary disease, unspecified: Secondary | ICD-10-CM | POA: Diagnosis not present

## 2014-09-04 DIAGNOSIS — I10 Essential (primary) hypertension: Secondary | ICD-10-CM | POA: Diagnosis not present

## 2014-09-04 DIAGNOSIS — R2681 Unsteadiness on feet: Secondary | ICD-10-CM | POA: Diagnosis not present

## 2014-09-08 DIAGNOSIS — S72042D Displaced fracture of base of neck of left femur, subsequent encounter for closed fracture with routine healing: Secondary | ICD-10-CM | POA: Diagnosis not present

## 2014-09-08 DIAGNOSIS — S72041S Displaced fracture of base of neck of right femur, sequela: Secondary | ICD-10-CM | POA: Diagnosis not present

## 2014-09-08 DIAGNOSIS — Z9181 History of falling: Secondary | ICD-10-CM | POA: Diagnosis not present

## 2014-09-08 DIAGNOSIS — I10 Essential (primary) hypertension: Secondary | ICD-10-CM | POA: Diagnosis not present

## 2014-09-08 DIAGNOSIS — R2681 Unsteadiness on feet: Secondary | ICD-10-CM | POA: Diagnosis not present

## 2014-09-08 DIAGNOSIS — J449 Chronic obstructive pulmonary disease, unspecified: Secondary | ICD-10-CM | POA: Diagnosis not present

## 2014-09-10 DIAGNOSIS — S72042D Displaced fracture of base of neck of left femur, subsequent encounter for closed fracture with routine healing: Secondary | ICD-10-CM | POA: Diagnosis not present

## 2014-09-10 DIAGNOSIS — I10 Essential (primary) hypertension: Secondary | ICD-10-CM | POA: Diagnosis not present

## 2014-09-10 DIAGNOSIS — R2681 Unsteadiness on feet: Secondary | ICD-10-CM | POA: Diagnosis not present

## 2014-09-10 DIAGNOSIS — Z9181 History of falling: Secondary | ICD-10-CM | POA: Diagnosis not present

## 2014-09-10 DIAGNOSIS — S72041S Displaced fracture of base of neck of right femur, sequela: Secondary | ICD-10-CM | POA: Diagnosis not present

## 2014-09-10 DIAGNOSIS — J449 Chronic obstructive pulmonary disease, unspecified: Secondary | ICD-10-CM | POA: Diagnosis not present

## 2014-09-11 DIAGNOSIS — Z9181 History of falling: Secondary | ICD-10-CM | POA: Diagnosis not present

## 2014-09-11 DIAGNOSIS — S72042D Displaced fracture of base of neck of left femur, subsequent encounter for closed fracture with routine healing: Secondary | ICD-10-CM | POA: Diagnosis not present

## 2014-09-11 DIAGNOSIS — I10 Essential (primary) hypertension: Secondary | ICD-10-CM | POA: Diagnosis not present

## 2014-09-11 DIAGNOSIS — J449 Chronic obstructive pulmonary disease, unspecified: Secondary | ICD-10-CM | POA: Diagnosis not present

## 2014-09-11 DIAGNOSIS — R2681 Unsteadiness on feet: Secondary | ICD-10-CM | POA: Diagnosis not present

## 2014-09-11 DIAGNOSIS — S72041S Displaced fracture of base of neck of right femur, sequela: Secondary | ICD-10-CM | POA: Diagnosis not present

## 2014-09-14 DIAGNOSIS — J449 Chronic obstructive pulmonary disease, unspecified: Secondary | ICD-10-CM | POA: Diagnosis not present

## 2014-09-14 DIAGNOSIS — I10 Essential (primary) hypertension: Secondary | ICD-10-CM | POA: Diagnosis not present

## 2014-09-14 DIAGNOSIS — S72042D Displaced fracture of base of neck of left femur, subsequent encounter for closed fracture with routine healing: Secondary | ICD-10-CM | POA: Diagnosis not present

## 2014-09-14 DIAGNOSIS — R2681 Unsteadiness on feet: Secondary | ICD-10-CM | POA: Diagnosis not present

## 2014-09-14 DIAGNOSIS — S72041S Displaced fracture of base of neck of right femur, sequela: Secondary | ICD-10-CM | POA: Diagnosis not present

## 2014-09-14 DIAGNOSIS — Z9181 History of falling: Secondary | ICD-10-CM | POA: Diagnosis not present

## 2014-09-15 ENCOUNTER — Ambulatory Visit (INDEPENDENT_AMBULATORY_CARE_PROVIDER_SITE_OTHER): Payer: Medicare Other | Admitting: Internal Medicine

## 2014-09-15 ENCOUNTER — Encounter: Payer: Self-pay | Admitting: Internal Medicine

## 2014-09-15 VITALS — BP 130/70 | HR 81 | Temp 97.2°F | Ht 63.0 in | Wt 106.4 lb

## 2014-09-15 DIAGNOSIS — E038 Other specified hypothyroidism: Secondary | ICD-10-CM

## 2014-09-15 DIAGNOSIS — I10 Essential (primary) hypertension: Secondary | ICD-10-CM

## 2014-09-15 DIAGNOSIS — F418 Other specified anxiety disorders: Secondary | ICD-10-CM | POA: Diagnosis not present

## 2014-09-15 DIAGNOSIS — N289 Disorder of kidney and ureter, unspecified: Secondary | ICD-10-CM

## 2014-09-15 DIAGNOSIS — J449 Chronic obstructive pulmonary disease, unspecified: Secondary | ICD-10-CM | POA: Diagnosis not present

## 2014-09-15 DIAGNOSIS — S72142S Displaced intertrochanteric fracture of left femur, sequela: Secondary | ICD-10-CM | POA: Diagnosis not present

## 2014-09-15 DIAGNOSIS — D62 Acute posthemorrhagic anemia: Secondary | ICD-10-CM

## 2014-09-15 MED ORDER — LOVASTATIN 20 MG PO TABS: 20.0000 mg | ORAL_TABLET | Freq: Every evening | ORAL | Status: DC

## 2014-09-15 MED ORDER — LORAZEPAM 0.5 MG PO TABS: ORAL_TABLET | ORAL | Status: DC

## 2014-09-15 MED ORDER — SERTRALINE HCL 100 MG PO TABS: ORAL_TABLET | ORAL | Status: DC

## 2014-09-15 MED ORDER — AMLODIPINE BESYLATE 5 MG PO TABS: 5.0000 mg | ORAL_TABLET | Freq: Every day | ORAL | Status: DC

## 2014-09-15 MED ORDER — HYDROCODONE-ACETAMINOPHEN 5-325 MG PO TABS: 1.0000 | ORAL_TABLET | Freq: Four times a day (QID) | ORAL | Status: DC | PRN

## 2014-09-15 MED ORDER — LEVOTHYROXINE SODIUM 75 MCG PO TABS: 75.0000 ug | ORAL_TABLET | Freq: Every day | ORAL | Status: DC

## 2014-09-15 MED ORDER — VITAMIN B-12 100 MCG PO TABS: 100.0000 ug | ORAL_TABLET | Freq: Every day | ORAL | Status: DC

## 2014-09-15 MED ORDER — QUETIAPINE FUMARATE 100 MG PO TABS: 100.0000 mg | ORAL_TABLET | Freq: Every day | ORAL | Status: DC

## 2014-09-15 NOTE — Progress Notes (Signed)
Patient ID: Jane [name], female   DOB: [birth year], 80 y.o.   MRN: [id number]    Chief Complaint  Patient presents with  . Follow-up    2 month Follow up   Allergies  Allergen Reactions  . Celebrex [Celecoxib]     unknown  . Chlorzoxazone     unknown   HPI 80 y/o female patient here for post hospital and SNF follow up. She normally sees Dr Renato Gails. She was in the hospital from 06/25/14-06/29/13 with closed left hip fracture. She underwent ORIF and required 2 u prbc transfusion and was then sent to [name] for STR. She was discharged from SNF on 08/20/14 with home PT/OT. She resides at [facility] in independent living in [place] and gets supplement service from [facility]. She gets this care 7 days a week, has a fall alarm. She has followed with Dr Roda Shutters from ortho and has had her staples removed and incision has healed fine. She is currently using a rollator walker all the time. No falls reported at home for now. She still has pain in her left leg 6/10 when worse esp post activities. Bowel movement is regular. No other concerns She has PMH of HTN, anxiety, asthma among others  Review of Systems  Constitutional: Negative for fever, chills, weight loss, diaphoresis.  HENT: Negative for congestion. Wears hearing aid  Eyes: Negative for blurred vision, double vision and discharge. has glasses, macular degeneration Respiratory: Negative for cough, sputum production, shortness of breath and wheezing.   Cardiovascular: Negative for chest pain, palpitations, orthopnea and leg swelling.  Gastrointestinal: Negative for heartburn, nausea, vomiting, abdominal pain, diarrhea and constipation.  Genitourinary: Negative for dysuria Musculoskeletal: Negative for back pain, falls Skin: Negative for itching and rash.  Neurological: Negative for dizziness, tingling, focal weakness and headaches.  Psychiatric/Behavioral: Negative for depression     Past Medical History  Diagnosis Date  . Abdominal  pain, epigastric   . Impacted cerumen   . Abnormality of gait   . Anal fissure   . Loss of weight   . Hyposmolality and/or hyponatremia   . Pain in limb   . Other vitamin B12 deficiency anemia   . Chest pain, unspecified   . Anemia, unspecified   . Anxiety state, unspecified   . Abdominal pain, right upper quadrant   . Other pulmonary embolism and infarction   . Intestinal or peritoneal adhesions with obstruction (postoperative) (postinfection)   . Long term (current) use of anticoagulants   . Abdominal pain, right lower quadrant   . Essential and other specified forms of tremor   . Lumbago   . Unspecified hypothyroidism   . Major depressive disorder, single episode, unspecified   . Other and unspecified hyperlipidemia   . Macular degeneration (senile) of retina, unspecified   . Unspecified glaucoma   . Unspecified cataract   . Unspecified hearing loss   . Unspecified essential hypertension   . Osteoarthrosis, unspecified whether generalized or localized, unspecified site   . Osteoporosis, unspecified   . Abnormal involuntary movements(781.0)   . Emphysema of lung    Past Surgical History  Procedure Laterality Date  . Excisional hemorrhoidectomy  1980  . Bladder suspension  1993  . Back surgery  2003  . Abdominal hysterectomy    . Trigger finger release  2009    Dr Teressa Senter  . Intramedullary (im) nail intertrochanteric Right 01/27/2014    Procedure: INTRAMEDULLARY (IM) NAIL INTERTROCHANTRIC;  Surgeon: Sheral Apley, MD;  Location: [facility] OR;  Service: Orthopedics;  Laterality: Right;  . Intramedullary (im) nail intertrochanteric Left 06/26/2014    Procedure: LEFT HIP INTRAMEDULLARY (IM) NAIL;  Surgeon: Cheral Almas, MD;  Location: [facility] OR;  Service: Orthopedics;  Laterality: Left;   Current Outpatient Prescriptions on File Prior to Visit  Medication Sig Dispense Refill  . acetaminophen (TYLENOL) 650 MG CR tablet Take 650 mg by mouth every 8 (eight) hours as needed for  pain.    Marland Kitchen albuterol (PROVENTIL HFA;VENTOLIN HFA) 108 (90 BASE) MCG/ACT inhaler Inhale 2 puffs into the lungs every 6 (six) hours as needed. Shortness of breath. 3 Inhaler 1  . calcium carbonate (TUMS - DOSED IN MG ELEMENTAL CALCIUM) 500 MG chewable tablet Chew 1 tablet by mouth daily as needed for indigestion or heartburn.    . calcium-vitamin D (OSCAL WITH D) 500-200 MG-UNIT per tablet Take 1 tablet by mouth daily.    Marland Kitchen dicyclomine (BENTYL) 10 MG capsule Take 10 mg by mouth at bedtime.    . fexofenadine (ALLEGRA) 180 MG tablet Take 180 mg by mouth daily as needed for allergies or rhinitis.    . hydrocortisone (ANUSOL-HC) 2.5 % rectal cream Place 1 application rectally daily as needed.    . latanoprost (XALATAN) 0.005 % ophthalmic solution Place 1 drop into both eyes at bedtime.    . Multiple Vitamins-Minerals (ICAPS LUTEIN & OMEGA-3) CAPS Take 1 capsule by mouth daily.    . polyethylene glycol (MIRALAX / GLYCOLAX) packet Take 17 g by mouth daily as needed for mild constipation. 14 each 0   No current facility-administered medications on file prior to visit.   Family History  Problem Relation Age of Onset  . Congestive Heart Failure Mother   . Macular degeneration Mother   . Heart disease Mother   . Cancer Sister     lung cancer  . Arthritis Daughter   . Heart disease Daughter   . Glaucoma Sister   . Cataracts Sister    History   Social History  . Marital Status: Married    Spouse Name: N/A    Number of Children: N/A  . Years of Education: N/A   Occupational History  . Not on file.   Social History Main Topics  . Smoking status: Former Smoker -- 40 years    Types: Cigarettes  . Smokeless tobacco: Not on file  . Alcohol Use: No  . Drug Use: No  . Sexual Activity: Not on file   Other Topics Concern  . Not on file   Social History Narrative   Physical exam BP 130/70 mmHg  Pulse 81  Temp(Src) 97.2 F (36.2 C) (Oral)  Ht  (1.6 m)  Wt 106 lb 6.4 oz (48.263 kg)   BMI 18.85 kg/m2  SpO2 93%  General- elderly female in no acute distress Head- atraumatic, normocephalic Eyes- no pallor, no icterus Neck- no lymphadenopathy Cardiovascular- normal s1,s2, no murmurs Respiratory- bilateral clear to auscultation, no wheeze, no rhonchi, no crackles Abdomen- bowel sounds present, soft, non tender Musculoskeletal- able to move all 4 extremities, unsteady gait with limping in left leg. Uses rollator walker  Neurological- no focal deficit, tremor present Psychiatry- alert and oriented, normal mood and affect  Imaging Dg Chest 1 View  06/25/2014   CLINICAL DATA:  Fall, pain  EXAM: CHEST - 1 VIEW  COMPARISON:  01/26/2014  FINDINGS: Lungs are hyperinflated compatible with COPD/ emphysema. Heart is enlarged. No superimposed CHF. Resolved right lower lobe pneumonia compared to 01/26/2014. No current airspace process,  edema, collapse or consolidation. No large effusion or pneumothorax. Atherosclerosis of the aorta. Right lower lobe granulomatous disease suspected.  IMPRESSION: COPD/ emphysema.  Hyperinflation.  Resolved right lower lobe pneumonia.   Electronically Signed   By: Ruel Favors M.D.   On: 06/25/2014 12:29   Dg Thoracic Spine W/swimmers  06/27/2014   CLINICAL DATA:  80 year old female status post fall with recent left hip fracture. Pain. Initial encounter.  EXAM: THORACIC SPINE - 2 VIEW + SWIMMERS  COMPARISON:  Lumbar series from the same day reported separately. Chest CT 03/25/2008.  FINDINGS: Extensive calcified atherosclerosis of the aorta. Osteopenia. Normal thoracic segmentation. Levo convex thoracolumbar scoliosis. Allowing for the scoliosis on the cross-table lateral view, no thoracic compression deformity identified. Thoracic vertebral height and alignment appears stable. Cervicothoracic junction alignment is within normal limits.  IMPRESSION: No definite thoracic spine fracture; osteopenia and scoliosis. If occult fracture is suspected, thoracic MRI or  whole-body bone scan would be most sensitive.   Electronically Signed   By: Augusto Gamble M.D.   On: 06/27/2014 20:17   Dg Lumbar Spine Complete  06/27/2014   CLINICAL DATA:  80 year old female with back pain. Recent fall with left hip fracture. Initial encounter.  EXAM: LUMBAR SPINE - COMPLETE 4+ VIEW  COMPARISON:  06/25/2014 lumbar radiographs and earlier.  FINDINGS: Extensive Aortoiliac calcified atherosclerosis noted. Lumbar vertebral height and alignment are stable, with levoconvex scoliosis again noted. Staple line now overlies the S1 level on the lateral view. Grossly intact visualized lower thoracic levels. Facet degeneration. Osteopenia. Partially visible proximal left femur hardware. Grossly intact sacrum.  IMPRESSION: No acute fracture or listhesis identified in the lumbar spine. If there is suspicion for occult fracture, lumbar MRI or nuclear medicine whole-body bone scan would be most sensitive.   Electronically Signed   By: Augusto Gamble M.D.   On: 06/27/2014 20:12   Dg Lumbar Spine Complete  06/25/2014   CLINICAL DATA:  Fall, left hip pain  EXAM: LUMBAR SPINE - COMPLETE 4+ VIEW  COMPARISON:  Concurrently obtained radiographs of the pelvis and left femur  FINDINGS: The bones are osteopenic. There is multilevel degenerative disease throughout the lumbar spine. Degenerative levoconvex scoliosis centered at L2. Atherosclerotic vascular calcifications are present throughout the aortoiliac system.  No acute fracture or malalignment.  IMPRESSION: 1. No acute fracture or malalignment. 2. Aortoiliac atherosclerosis. 3. Osteopenia. 4. Multilevel degenerative disc disease with degenerative levoconvex scoliosis centered at L2.   Electronically Signed   By: Malachy Moan M.D.   On: 06/25/2014 12:48   Dg Pelvis 1-2 Views  06/25/2014   CLINICAL DATA:  Fall, left hip pain  EXAM: PELVIS - 1-2 VIEW  COMPARISON:  Concurrently obtained radiographs of the left femur; Prior radiographs of the pelvis 01/27/2014   FINDINGS: Subtle disruption of the cortical surface in the mid aspect of the lesser trochanter and also along the intertrochanteric line. Although no definite fracture is identified, a nondisplaced intertrochanteric fracture is strongly suspected. The remainder of the visualized bony pelvis appears intact. Surgical changes of prior ORIF of a now healed right intertrochanteric fracture. Multilevel degenerative change in the lumbar spine. The bones are osteopenic. Atherosclerotic vascular calcifications.  IMPRESSION: 1. Strongly suspect nondisplaced left intertrochanteric fracture. If further imaging is warranted for confirmation, MRI of the pelvis is recommended given the patient's underlying osteopenia. 2. Surgical changes of prior ORIF of a now healed right intertrochanteric fracture. 3. Atherosclerotic vascular calcifications.   Electronically Signed   By: Isac Caddy.D.  On: 06/25/2014 12:44   Dg Hip Operative Left  06/26/2014   CLINICAL DATA:  Left hip fracture.  EXAM: OPERATIVE LEFT HIP  COMPARISON:  Pelvis radiograph 06/25/2014  FINDINGS: Four intraoperative spot fluoroscopic images of the left femur are provided. These demonstrate interval internal fixation of the previously described intertrochanteric fracture with an anterograde intramedullary nail and 2 proximal screws. The lesser trochanter fracture is mildly displaced medially. The femoral head remains approximated with the acetabulum. Vascular calcification is noted.  IMPRESSION: Intraoperative images during internal fixation of intertrochanteric fracture of the left femur.   Electronically Signed   By: Sebastian Ache   On: 06/26/2014 09:27   Dg Femur Left  06/25/2014   CLINICAL DATA:  Fall  EXAM: LEFT FEMUR - 2 VIEW  COMPARISON:  Concurrently obtained radiographs of the pelvis  FINDINGS: On the lateral view, there is a lucency in the intertrochanteric region of the femur. On the frontal view, the lucency is not well seen. I strongly  suspect a nondisplaced and nearly occult intertrochanteric fracture. No knee joint effusion. Atherosclerotic calcifications present throughout the common femoral, superficial femoral and popliteal arteries.  IMPRESSION: 1. Suspect nearly occult, nondisplaced intertrochanteric fracture. A lucency can be seen within the intertrochanteric region on the cross-table lateral view. Recommend further evaluation with MRI of the pelvis for confirmation. 2. Atherosclerotic vascular calcifications.   Electronically Signed   By: Malachy Moan M.D.   On: 06/25/2014 12:41   Ct Head Wo Contrast  06/25/2014   CLINICAL DATA:  Fall  EXAM: CT HEAD WITHOUT CONTRAST  TECHNIQUE: Contiguous axial images were obtained from the base of the skull through the vertex without intravenous contrast.  COMPARISON:  None.  FINDINGS: Negative for acute intracranial hemorrhage, acute infarction, mass, mass effect, hydrocephalus or midline shift. Gray-white differentiation is preserved throughout. Advanced, confluent periventricular and subcortical white matter hypoattenuation which is nonspecific but most suggestive of the sequela of longstanding microvascular ischemic white matter disease. Remote lacunar infarct versus dilated perivascular space in the right subinsular cortex. No focal scalp hematoma or calvarial fracture. Bilateral lens extractions. Normal aeration of the mastoid air cells and visualized paranasal sinuses. Atherosclerotic calcifications noted in both cavernous carotid arteries.  IMPRESSION: 1. No acute intracranial abnormality. 2. Advanced sequelae of chronic microvascular ischemic white matter disease. 3. Remote lacunar infarct versus dilated perivascular space in the right subinsular cortex. 4. Intracranial atherosclerosis.   Electronically Signed   By: Malachy Moan M.D.   On: 06/25/2014 12:51   US Pelvis Limited  06/22/2014   CLINICAL DATA:  Tender right buttock mass.  EXAM: US PELVIS LIMITED  TECHNIQUE: Ultrasound  examination of the pelvic soft tissues was performed in the area of clinical concern.  COMPARISON:  None.  FINDINGS: There is a poorly marginated 4.2 x 5.1 x 1.7 cm inhomogeneous soft tissue mass in the subcutaneous fat of the right buttock just to the right of the sacrum. There is some blood flow within this area. The appearance is not consistent with a benign lipoma.  IMPRESSION: Poorly defined soft tissue mass in the right buttock. The appearance is nonspecific. I suspect this is a chronic inflammatory process. There is a subtle area of abnormality in the overlying skin suggesting there may have been a prior infection at this site. The patient denies having a known prior sacral ulcer.  The patient also reports that the size of the lesion fluctuates.  This could be further assessed and better characterized with soft tissue MRI with and without  contrast if the lesion enlarges or persists.   Electronically Signed   By: Geanie Cooley M.D.   On: 06/22/2014 15:09   Ct Hip Left Wo Contrast  06/25/2014   CLINICAL DATA:  Severe left lateral hip pain secondary to a fall today.  EXAM: CT OF THE LEFT HIP WITHOUT CONTRAST  TECHNIQUE: Multidetector CT imaging was performed according to the standard protocol. Multiplanar CT image reconstructions were also generated.  COMPARISON:  Radiographs dated 06/25/2014  FINDINGS: There is a comminuted intertrochanteric fracture of the proximal left femur with slight impaction. The greater and lesser trochanters are fragmented. Femoral head is intact. No dislocation.  Pelvic bones are intact. Severe degenerative changes in the lower lumbar spine. Intra medullary rod and compression screw in the right hip. The fracture has healed on the right.  IMPRESSION: Comminuted intertrochanteric fracture of the proximal left femur as described.   Electronically Signed   By: Geanie Cooley M.D.   On: 06/25/2014 15:08     Labs- Lab Results  Component Value Date   WBC 5.7 06/29/2014   HGB 8.6*  06/29/2014   HCT 25.3* 06/29/2014   MCV 86.1 06/29/2014   PLT 146* 06/29/2014   CMP     Component Value Date/Time   NA 139 06/28/2014 0404   NA 143 06/11/2014 1204   K 3.5* 06/28/2014 0404   CL 103 06/28/2014 0404   CO2 25 06/28/2014 0404   GLUCOSE 98 06/28/2014 0404   GLUCOSE 82 06/11/2014 1204   BUN 14 06/28/2014 0404   BUN 23 06/11/2014 1204   CREATININE 0.71 06/28/2014 0404   CALCIUM 8.3* 06/28/2014 0404   PROT 5.0* 06/28/2014 0404   PROT 6.7 06/11/2014 1204   ALBUMIN 2.8* 06/28/2014 0404   AST 16 06/28/2014 0404   ALT <5 06/28/2014 0404   ALKPHOS 47 06/28/2014 0404   BILITOT 0.5 06/28/2014 0404   GFRNONAA 76* 06/28/2014 0404   GFRAA 88* 06/28/2014 0404    Assessment/plan  1. Intertrochanteric fracture of left hip, sequela S/p ORIF. Continue hydrocodone-apap 5-325 1-2 tab q6h prn for pain. Has f/u with orthopedic, continue PT/OT. Refill on pain med provided. Continue oscal  2. Acute blood loss anemia S/p 2 u transfusion in hospital. Off iron supplement at present. Check cbc today - CBC with Differential  3. Renal impairment Her ACEI was held in hospital with impaired renal function. Recheck renal function - CMP  4. Other specified hypothyroidism Continue levothyroxine 75 mcg daily  5. Chronic obstructive airway disease with asthma Stable on her prn albuterol  6. Essential hypertension, benign Stable, continue norvasc 5 mg daily for now  7. Depression with anxiety Continue seroquel and zoloft, refill provided. Continue ativan  Spent more than 40 minutes in patient care

## 2014-09-16 DIAGNOSIS — S72041S Displaced fracture of base of neck of right femur, sequela: Secondary | ICD-10-CM | POA: Diagnosis not present

## 2014-09-16 DIAGNOSIS — R2681 Unsteadiness on feet: Secondary | ICD-10-CM | POA: Diagnosis not present

## 2014-09-16 DIAGNOSIS — Z9181 History of falling: Secondary | ICD-10-CM | POA: Diagnosis not present

## 2014-09-16 DIAGNOSIS — J449 Chronic obstructive pulmonary disease, unspecified: Secondary | ICD-10-CM | POA: Diagnosis not present

## 2014-09-16 DIAGNOSIS — S72042D Displaced fracture of base of neck of left femur, subsequent encounter for closed fracture with routine healing: Secondary | ICD-10-CM | POA: Diagnosis not present

## 2014-09-16 DIAGNOSIS — I10 Essential (primary) hypertension: Secondary | ICD-10-CM | POA: Diagnosis not present

## 2014-09-16 LAB — CBC WITH DIFFERENTIAL/PLATELET
BASOS: 0 %
Basophils Absolute: 0 10*3/uL (ref 0.0–0.2)
EOS ABS: 0.1 10*3/uL (ref 0.0–0.4)
EOS: 2 %
HCT: 39.7 % (ref 34.0–46.6)
HEMOGLOBIN: 12.8 g/dL (ref 11.1–15.9)
Immature Grans (Abs): 0 10*3/uL (ref 0.0–0.1)
Immature Granulocytes: 0 %
LYMPHS: 22 %
Lymphocytes Absolute: 1.3 10*3/uL (ref 0.7–3.1)
MCH: 30.1 pg (ref 26.6–33.0)
MCHC: 32.2 g/dL (ref 31.5–35.7)
MCV: 93 fL (ref 79–97)
MONOS ABS: 0.6 10*3/uL (ref 0.1–0.9)
Monocytes: 10 %
NEUTROS ABS: 3.8 10*3/uL (ref 1.4–7.0)
Neutrophils Relative %: 66 %
RBC: 4.25 x10E6/uL (ref 3.77–5.28)
RDW: 15.5 % — ABNORMAL HIGH (ref 12.3–15.4)
WBC: 5.8 10*3/uL (ref 3.4–10.8)

## 2014-09-16 LAB — COMPREHENSIVE METABOLIC PANEL
ALT: 11 IU/L (ref 0–32)
AST: 17 IU/L (ref 0–40)
Albumin/Globulin Ratio: 2.3 (ref 1.1–2.5)
Albumin: 4.6 g/dL (ref 3.5–4.7)
Alkaline Phosphatase: 87 IU/L (ref 39–117)
BILIRUBIN TOTAL: 0.3 mg/dL (ref 0.0–1.2)
BUN/Creatinine Ratio: 20 (ref 11–26)
BUN: 16 mg/dL (ref 8–27)
CO2: 26 mmol/L (ref 18–29)
CREATININE: 0.81 mg/dL (ref 0.57–1.00)
Calcium: 9.4 mg/dL (ref 8.7–10.3)
Chloride: 100 mmol/L (ref 97–108)
GFR, EST AFRICAN AMERICAN: 76 mL/min/{1.73_m2} (ref >59)
GFR, EST NON AFRICAN AMERICAN: 66 mL/min/{1.73_m2} (ref >59)
GLUCOSE: 102 mg/dL — AB (ref 65–99)
Globulin, Total: 2 g/dL (ref 1.5–4.5)
POTASSIUM: 4.4 mmol/L (ref 3.5–5.2)
Sodium: 141 mmol/L (ref 134–144)
Total Protein: 6.6 g/dL (ref 6.0–8.5)

## 2014-09-17 ENCOUNTER — Ambulatory Visit: Payer: Medicare Other | Admitting: Internal Medicine

## 2014-09-18 DIAGNOSIS — J449 Chronic obstructive pulmonary disease, unspecified: Secondary | ICD-10-CM | POA: Diagnosis not present

## 2014-09-18 DIAGNOSIS — I10 Essential (primary) hypertension: Secondary | ICD-10-CM | POA: Diagnosis not present

## 2014-09-18 DIAGNOSIS — R2681 Unsteadiness on feet: Secondary | ICD-10-CM | POA: Diagnosis not present

## 2014-09-18 DIAGNOSIS — Z9181 History of falling: Secondary | ICD-10-CM | POA: Diagnosis not present

## 2014-09-18 DIAGNOSIS — S72042D Displaced fracture of base of neck of left femur, subsequent encounter for closed fracture with routine healing: Secondary | ICD-10-CM | POA: Diagnosis not present

## 2014-09-18 DIAGNOSIS — S72041S Displaced fracture of base of neck of right femur, sequela: Secondary | ICD-10-CM | POA: Diagnosis not present

## 2014-09-21 DIAGNOSIS — H4011X2 Primary open-angle glaucoma, moderate stage: Secondary | ICD-10-CM | POA: Diagnosis not present

## 2014-09-22 ENCOUNTER — Encounter: Payer: Self-pay | Admitting: *Deleted

## 2014-09-22 DIAGNOSIS — J449 Chronic obstructive pulmonary disease, unspecified: Secondary | ICD-10-CM | POA: Diagnosis not present

## 2014-09-22 DIAGNOSIS — R2681 Unsteadiness on feet: Secondary | ICD-10-CM | POA: Diagnosis not present

## 2014-09-22 DIAGNOSIS — S72041S Displaced fracture of base of neck of right femur, sequela: Secondary | ICD-10-CM | POA: Diagnosis not present

## 2014-09-22 DIAGNOSIS — Z9181 History of falling: Secondary | ICD-10-CM | POA: Diagnosis not present

## 2014-09-22 DIAGNOSIS — I10 Essential (primary) hypertension: Secondary | ICD-10-CM | POA: Diagnosis not present

## 2014-09-22 DIAGNOSIS — S72042D Displaced fracture of base of neck of left femur, subsequent encounter for closed fracture with routine healing: Secondary | ICD-10-CM | POA: Diagnosis not present

## 2014-09-24 DIAGNOSIS — I10 Essential (primary) hypertension: Secondary | ICD-10-CM | POA: Diagnosis not present

## 2014-09-24 DIAGNOSIS — S72042D Displaced fracture of base of neck of left femur, subsequent encounter for closed fracture with routine healing: Secondary | ICD-10-CM | POA: Diagnosis not present

## 2014-09-24 DIAGNOSIS — R2681 Unsteadiness on feet: Secondary | ICD-10-CM | POA: Diagnosis not present

## 2014-09-24 DIAGNOSIS — S72041S Displaced fracture of base of neck of right femur, sequela: Secondary | ICD-10-CM | POA: Diagnosis not present

## 2014-09-24 DIAGNOSIS — Z9181 History of falling: Secondary | ICD-10-CM | POA: Diagnosis not present

## 2014-09-24 DIAGNOSIS — J449 Chronic obstructive pulmonary disease, unspecified: Secondary | ICD-10-CM | POA: Diagnosis not present

## 2014-09-24 DIAGNOSIS — S72142D Displaced intertrochanteric fracture of left femur, subsequent encounter for closed fracture with routine healing: Secondary | ICD-10-CM | POA: Diagnosis not present

## 2014-10-12 ENCOUNTER — Ambulatory Visit: Payer: Medicare Other | Admitting: Internal Medicine

## 2014-10-14 ENCOUNTER — Other Ambulatory Visit: Payer: Self-pay | Admitting: *Deleted

## 2014-10-14 MED ORDER — LORAZEPAM 0.5 MG PO TABS: ORAL_TABLET | ORAL | Status: DC

## 2014-10-14 NOTE — Telephone Encounter (Signed)
Pharmacy Requested 

## 2014-10-16 ENCOUNTER — Telehealth: Payer: Self-pay | Admitting: Internal Medicine

## 2014-10-16 ENCOUNTER — Telehealth: Payer: Self-pay

## 2014-10-16 MED ORDER — QUETIAPINE FUMARATE 25 MG PO TABS: ORAL_TABLET | ORAL | Status: DC

## 2014-10-16 NOTE — Telephone Encounter (Signed)
Spoke Dr. Renato Gails, request Serquil medication discontinue, says if things change to please call back.  Left a message on [facility] line at [phone] with these intructions...cdavis

## 2014-10-16 NOTE — Telephone Encounter (Signed)
Baird Lyons called back from [facility], she spoke with patient's daughter she feels that she needs something for depression around this time of the year. In the past she has take Seroquel 25mg , doesn't want anything stronger. This was ok with Dr. . Fax order to [facility] at [phone]

## 2014-10-16 NOTE — Telephone Encounter (Signed)
[facility] called.  Says a error was made in the packaging of her medication and the pt has not been on Seroqul for over a mth.  The Resident Care Director, Baird Lyons  Wants  to make MD aware and ask if patient needs to continue to be on the 100 mg or reduce the dosage.   Call back # [phone].

## 2014-11-05 ENCOUNTER — Other Ambulatory Visit: Payer: Self-pay | Admitting: Internal Medicine

## 2014-11-09 ENCOUNTER — Encounter: Payer: Self-pay | Admitting: *Deleted

## 2014-11-16 ENCOUNTER — Ambulatory Visit (INDEPENDENT_AMBULATORY_CARE_PROVIDER_SITE_OTHER): Payer: Medicare Other | Admitting: Internal Medicine

## 2014-11-16 ENCOUNTER — Encounter: Payer: Self-pay | Admitting: Internal Medicine

## 2014-11-16 VITALS — BP 132/70 | HR 85 | Temp 97.8°F | Resp 18 | Ht 63.0 in | Wt 104.2 lb

## 2014-11-16 DIAGNOSIS — M5442 Lumbago with sciatica, left side: Secondary | ICD-10-CM | POA: Diagnosis not present

## 2014-11-16 DIAGNOSIS — S72142S Displaced intertrochanteric fracture of left femur, sequela: Secondary | ICD-10-CM

## 2014-11-16 DIAGNOSIS — I10 Essential (primary) hypertension: Secondary | ICD-10-CM

## 2014-11-16 DIAGNOSIS — R350 Frequency of micturition: Secondary | ICD-10-CM

## 2014-11-16 MED ORDER — HYDROCODONE-ACETAMINOPHEN 5-325 MG PO TABS: 1.0000 | ORAL_TABLET | Freq: Four times a day (QID) | ORAL | Status: DC | PRN

## 2014-11-16 MED ORDER — TRAMADOL HCL 50 MG PO TABS: 50.0000 mg | ORAL_TABLET | Freq: Four times a day (QID) | ORAL | Status: DC

## 2014-11-16 MED ORDER — MIRABEGRON ER 25 MG PO TB24: 25.0000 mg | ORAL_TABLET | Freq: Every day | ORAL | Status: DC

## 2014-11-16 NOTE — Patient Instructions (Signed)
Stop hydrocodone and tylenol Start tramadol 4x daily (last at bedtime)  We will send your urine off for culture--if it is negative, you can try myrbetriq for your bladder.

## 2014-11-16 NOTE — Progress Notes (Signed)
Patient ID: Jane [name], female   DOB: Apr 03, 1925, 82 y.o.   MRN: [id number]   Location:  [facility] [place] [facility]  Code Status: DNR  Allergies  Allergen Reactions  . Celebrex [Celecoxib]     unknown  . Chlorzoxazone     unknown    Chief Complaint  Patient presents with  . Medical Management of Chronic Issues    HPI: Patient is a 81 y.o. white female seen in the office today for med mgt of chronic diseases.    Has chronic left hip pain since 8/15 fx.  (see Dr. Volney Presser note)  Now lives at [facility].  [facility] visits her every morning.  They give the morning and hs pills.    Is alternating 1 tylenol arthritis in morning, hydrocodone supper, 1 tylenol arthritis in evening and hydrocodone at bedtime.  Says tylenol does not help at all.  Has never tried tramadol.   Bowels are moving well at this point.  Apparently has some family members that work in the facility that keep a special close eye on her which helps her daughter Jane [name] to some degree.    Review of Systems:  Review of Systems  Constitutional: Negative for fever, chills and weight loss.       Gained 4 lbs  HENT: Negative for congestion.   Respiratory: Negative for shortness of breath.   Cardiovascular: Negative for chest pain and leg swelling.  Gastrointestinal: Negative for abdominal pain, constipation, blood in stool and melena.  Genitourinary: Positive for urgency and frequency. Negative for dysuria and hematuria.  Musculoskeletal: Positive for back pain and joint pain.  Skin: Negative for rash.  Neurological: Positive for tremors. Negative for dizziness and loss of consciousness.       Rapid resting tremor  Psychiatric/Behavioral: Positive for depression and memory loss. The patient is nervous/anxious.        Well controlled at present     Past Medical History  Diagnosis Date  . Abdominal pain, epigastric   . Impacted cerumen   . Abnormality of gait   . Anal  fissure   . Loss of weight   . Hyposmolality and/or hyponatremia   . Pain in limb   . Other vitamin B12 deficiency anemia   . Chest pain, unspecified   . Anemia, unspecified   . Anxiety state, unspecified   . Abdominal pain, right upper quadrant   . Other pulmonary embolism and infarction   . Intestinal or peritoneal adhesions with obstruction (postoperative) (postinfection)   . Long term (current) use of anticoagulants   . Abdominal pain, right lower quadrant   . Essential and other specified forms of tremor   . Lumbago   . Unspecified hypothyroidism   . Major depressive disorder, single episode, unspecified   . Other and unspecified hyperlipidemia   . Macular degeneration (senile) of retina, unspecified   . Unspecified glaucoma   . Unspecified cataract   . Unspecified hearing loss   . Unspecified essential hypertension   . Osteoarthrosis, unspecified whether generalized or localized, unspecified site   . Osteoporosis, unspecified   . Abnormal involuntary movements(781.0)   . Emphysema of lung     Past Surgical History  Procedure Laterality Date  . Excisional hemorrhoidectomy  1980  . Bladder suspension  1993  . Back surgery  2003  . Abdominal hysterectomy    . Trigger finger release  2009    Dr Teressa Senter  . Intramedullary (im) nail intertrochanteric Right  01/27/2014    Procedure: INTRAMEDULLARY (IM) NAIL INTERTROCHANTRIC;  Surgeon: Sheral Apley, MD;  Location: [facility] OR;  Service: Orthopedics;  Laterality: Right;  . Intramedullary (im) nail intertrochanteric Left 06/26/2014    Procedure: LEFT HIP INTRAMEDULLARY (IM) NAIL;  Surgeon: Cheral Almas, MD;  Location: [facility] OR;  Service: Orthopedics;  Laterality: Left;    Social History:   reports that she has quit smoking. Her smoking use included Cigarettes. She smoked 0.00 packs per day for 40 years. She does not have any smokeless tobacco history on file. She reports that she does not drink alcohol or use illicit  drugs.  Family History  Problem Relation Age of Onset  . Congestive Heart Failure Mother   . Macular degeneration Mother   . Heart disease Mother   . Cancer Sister     lung cancer  . Arthritis Daughter   . Heart disease Daughter   . Glaucoma Sister   . Cataracts Sister     Medications: Patient's Medications  New Prescriptions   No medications on file  Previous Medications   ACETAMINOPHEN (TYLENOL) 650 MG CR TABLET    Take 650 mg by mouth every 8 (eight) hours as needed for pain.   ALBUTEROL (PROVENTIL HFA;VENTOLIN HFA) 108 (90 BASE) MCG/ACT INHALER    Inhale 2 puffs into the lungs every 6 (six) hours as needed. Shortness of breath.   AMLODIPINE (NORVASC) 5 MG TABLET    Take 1 tablet (5 mg total) by mouth daily.   CALCIUM CARBONATE (TUMS - DOSED IN MG ELEMENTAL CALCIUM) 500 MG CHEWABLE TABLET    Chew 1 tablet by mouth daily as needed for indigestion or heartburn.   CALCIUM-VITAMIN D (OSCAL WITH D) 500-200 MG-UNIT PER TABLET    Take 1 tablet by mouth daily.   DICYCLOMINE (BENTYL) 10 MG CAPSULE    Take 10 mg by mouth at bedtime.   FEXOFENADINE (ALLEGRA) 180 MG TABLET    Take 180 mg by mouth daily as needed for allergies or rhinitis.   HYDROCORTISONE (ANUSOL-HC) 2.5 % RECTAL CREAM    Place 1 application rectally daily as needed.   LATANOPROST (XALATAN) 0.005 % OPHTHALMIC SOLUTION    Place 1 drop into both eyes at bedtime.   LEVOTHYROXINE (SYNTHROID, LEVOTHROID) 75 MCG TABLET    Take 1 tablet (75 mcg total) by mouth daily before breakfast.   LORAZEPAM (ATIVAN) 0.5 MG TABLET    TAKE TWO TABLETS BY MOUTH AT BEDTIME TO HELP REST   LOVASTATIN (MEVACOR) 20 MG TABLET    Take 1 tablet (20 mg total) by mouth every evening.   MULTIPLE VITAMINS-MINERALS (ICAPS LUTEIN & OMEGA-3) CAPS    Take 1 capsule by mouth daily.   POLYETHYLENE GLYCOL (MIRALAX / GLYCOLAX) PACKET    Take 17 g by mouth daily as needed for mild constipation.   QUETIAPINE (SEROQUEL) 25 MG TABLET    Take one tablet by mouth daily  for depression   SERTRALINE (ZOLOFT) 100 MG TABLET    Take one tablet by mouth once daily   VITAMIN B-12 (CYANOCOBALAMIN) 100 MCG TABLET    Take 1 tablet (100 mcg total) by mouth daily.  Modified Medications   Modified Medication Previous Medication   HYDROCODONE-ACETAMINOPHEN (NORCO) 5-325 MG PER TABLET HYDROcodone-acetaminophen (NORCO) 5-325 MG per tablet      Take 1-2 tablets by mouth every 6 (six) hours as needed.    Take 1-2 tablets by mouth every 6 (six) hours as needed.  Discontinued Medications   No  medications on file     Physical Exam: Filed Vitals:   11/16/14 1319  BP: 132/70  Pulse: 85  Temp: 97.8 F (36.6 C)  TempSrc: Oral  Resp: 18  Height: 5\' 3"  (1.6 m)  Weight: 104 lb 3.2 oz (47.265 kg)  SpO2: 92%  Physical Exam  Constitutional:  Frail white female, ambulates with rollator walker  Eyes:  glasses  Cardiovascular: Normal rate, regular rhythm, normal heart sounds and intact distal pulses.   Pulmonary/Chest: Effort normal and breath sounds normal. No respiratory distress.  Abdominal: Soft. Bowel sounds are normal. She exhibits no distension and no mass. There is no tenderness.  Musculoskeletal: Normal range of motion.  Pain radiating from left lower back down left leg and across knee  Neurological: She is alert.     Labs reviewed: Basic Metabolic Panel:  Recent Labs  0924  01/26/14 2038  06/11/14 1204  06/27/14 0655 06/28/14 0404 09/15/14 1410  NA 142  < >  --   < > 143  < > 140 139 141  K 4.4  < >  --   < > 4.4  < > 4.1 3.5* 4.4  CL 104  < >  --   < > 99  < > 105 103 100  CO2 23  < >  --   < > 26  < > 25 25 26   GLUCOSE 83  < >  --   < > 82  < > 109* 98 102*  BUN 18  < >  --   < > 23  < > 17 14 16   CREATININE 0.94  < >  --   < > 0.81  < > 0.79 0.71 0.81  CALCIUM 9.7  < >  --   < > 9.9  < > 8.0* 8.3* 9.4  TSH 6.250*  --  0.851  --  3.050  --   --   --   --   < > = values in this interval not displayed. Liver Function Tests:  Recent  Labs  06/27/14 0655 06/28/14 0404 09/15/14 1410  AST 13 16 17   ALT 6 <5 11  ALKPHOS 45 47 87  BILITOT 0.3 0.5 0.3  PROT 4.7* 5.0* 6.6  ALBUMIN 2.6* 2.8*  --    No results for input(s): LIPASE, AMYLASE in the last 8760 hours. No results for input(s): AMMONIA in the last 8760 hours. CBC:  Recent Labs  01/26/14 1630  06/25/14 1145  06/27/14 1145 06/28/14 0836 06/29/14 0512 09/15/14 1410  WBC 23.2*  < > 12.3*  < > 5.6 6.9 5.7 5.8  NEUTROABS 20.7*  --  11.0*  --   --   --   --  3.8  HGB 12.9  < > 11.7*  < > 6.3* 10.6* 8.6* 12.8  HCT 37.1  < > 34.6*  < > 18.9* 31.4* 25.3* 39.7  MCV 88.1  < > 91.5  < > 91.7 85.3 86.1 93  PLT 374  < > 170  < > 99* 129* 146*  --   < > = values in this interval not displayed. Lipid Panel:  Recent Labs  11/20/13 0924  HDL 56  LDLCALC 77  TRIG 68  CHOLHDL 2.6     Assessment/Plan 1. Intertrochanteric fracture of left hip, sequela -has had pain in left lower back and leg since her last fall with this fracture -keep f/u with orthopedics -cont use of walker and therapy -will try tramadol 4x daily  for pain--if this is ineffective, would like to add lyrica for the neuropathic nature of the pain - traMADol (ULTRAM) 50 MG tablet; Take 1 tablet (50 mg total) by mouth 4 (four) times daily.  Dispense: 30 tablet; Refill: 0 -still has occasional pain in groin on left also due to pelvic fx that could not be repaired  2. Urinary frequency - will r/o UTI and if UA negative, may start myrbetriq for this, also does have occasional urge incontinence - mirabegron ER (MYRBETRIQ) 25 MG TB24 tablet; Take 1 tablet (25 mg total) by mouth daily.  Dispense: 30 tablet; Refill: 0 - Urinalysis with Reflex Microscopic - Urine culture  3. Left-sided low back pain with left-sided sciatica -seems this developed after her last fall -again, will try tramadol, if ineffective would favor adding lyrica for neuropathic pain, but, of note, hydrocodone is very effective--I am  concerned she will get too confused if she takes too much of it; however. - traMADol (ULTRAM) 50 MG tablet; Take 1 tablet (50 mg total) by mouth 4 (four) times daily.  Dispense: 30 tablet; Refill: 0  4. Essential hypertension, benign -bp at goal with norvasc 5mg  only, goal <150/90 with her degree of frailty and fall risk  Labs/tests ordered:   Orders Placed This Encounter  Procedures  . Urine culture  . Urinalysis with Reflex Microscopic   Next appt:  3 mos   Janisha Bueso L. Sherill Mangen, D.O. [facility] [address], [place] Cell Phone (Mon-Fri 8am-5pm):  [phone] On Call:  [phone] & follow prompts after 5pm & weekends Office Phone:  [phone] Office Fax:  [phone]

## 2014-11-17 ENCOUNTER — Telehealth: Payer: Self-pay | Admitting: *Deleted

## 2014-11-17 LAB — URINALYSIS, ROUTINE W REFLEX MICROSCOPIC
Bilirubin, UA: NEGATIVE
Glucose, UA: NEGATIVE
Ketones, UA: NEGATIVE
Leukocytes, UA: NEGATIVE
Nitrite, UA: NEGATIVE
RBC, UA: NEGATIVE
Specific Gravity, UA: >=1.03 — AB (ref 1.005–1.030)
Urobilinogen, Ur: 0.2 mg/dL (ref 0.2–1.0)
pH, UA: 6 (ref 5.0–7.5)

## 2014-11-17 MED ORDER — HYDROCODONE-ACETAMINOPHEN 5-325 MG PO TABS: ORAL_TABLET | ORAL | Status: DC

## 2014-11-17 NOTE — Telephone Encounter (Signed)
[name] Notified and agreed. Rx printed for pickup

## 2014-11-17 NOTE — Telephone Encounter (Signed)
Ok, d/c tramadol.  Begin hydrocodone 5/325mg  one tablet by mouth at meals and at bedtime.  If this makes her too sleepy or confused, I may add lyrica low dose and decrease the frequency of the hydrocodone.  This would help with nerve pain down her leg from her back.

## 2014-11-17 NOTE — Telephone Encounter (Signed)
Patient daughter, Jane [name] called and stated that the Tramadol did not work. Patient was up all night last night in pain. Patient stated that she is not going to take the Tramadol anymore and went back to taking the Hydrocodone today. Feels like this manages her pain better. Wants a Rx for this instead. Please Advise.

## 2014-11-18 ENCOUNTER — Telehealth: Payer: Self-pay | Admitting: *Deleted

## 2014-11-18 NOTE — Telephone Encounter (Signed)
I don't really want her to take oxybutynin.   She has already fallen and broken two hips.  Will they cover it with a peer to peer or some prior auth paperwork?  She really can't take any of the old bladder medications safely.

## 2014-11-18 NOTE — Telephone Encounter (Signed)
Jane [name] with [facility] called and stated that patient's insurance will not cover Myrebetriq. Will cover Oxybutnin ER Preferred. Please Advise.

## 2014-11-18 NOTE — Telephone Encounter (Signed)
Pharmacy going to fax prior Auth for Myrebetriq.

## 2014-11-19 LAB — URINE CULTURE

## 2014-11-19 NOTE — Telephone Encounter (Signed)
Spoke with Florentina Addison at [facility] and medication was approved through 11/06/2015. [facility] Notified.

## 2014-12-01 ENCOUNTER — Other Ambulatory Visit: Payer: Self-pay | Admitting: Nurse Practitioner

## 2014-12-07 ENCOUNTER — Other Ambulatory Visit: Payer: Self-pay | Admitting: Internal Medicine

## 2014-12-14 ENCOUNTER — Telehealth: Payer: Self-pay | Admitting: *Deleted

## 2014-12-14 NOTE — Telephone Encounter (Signed)
Ok, let's increase to 50mg  daily.  If we have samples, may come pick some up.

## 2014-12-14 NOTE — Telephone Encounter (Signed)
Patient daughter, Jane [name] called and stated that the Myrebetriq is helping but thinks it needs to be increased to 50mg . Please Advise.

## 2014-12-15 MED ORDER — MIRABEGRON ER 50 MG PO TB24: ORAL_TABLET | ORAL | Status: DC

## 2014-12-15 MED ORDER — QUETIAPINE FUMARATE 25 MG PO TABS: ORAL_TABLET | ORAL | Status: DC

## 2014-12-15 NOTE — Telephone Encounter (Signed)
A month worth of Samples given. [name], daughter Notified and agreed.

## 2014-12-15 NOTE — Addendum Note (Signed)
Addended by: Nelda Severe A on: 12/15/2014 03:25 PM   Modules accepted: Orders

## 2014-12-15 NOTE — Telephone Encounter (Signed)
Baird Lyons from [facility] needs a Rx faxed to her for the Myrbetriq so she can give to patient. Rx printed and Dr. Chilton Si signed and faxed to [phone]

## 2014-12-17 ENCOUNTER — Other Ambulatory Visit: Payer: Self-pay | Admitting: *Deleted

## 2014-12-17 MED ORDER — HYDROCODONE-ACETAMINOPHEN 5-325 MG PO TABS: ORAL_TABLET | ORAL | Status: DC

## 2014-12-17 NOTE — Telephone Encounter (Signed)
Daughter Requested and will pick up

## 2014-12-22 DIAGNOSIS — M25552 Pain in left hip: Secondary | ICD-10-CM | POA: Diagnosis not present

## 2014-12-29 ENCOUNTER — Other Ambulatory Visit: Payer: Self-pay | Admitting: Internal Medicine

## 2015-01-14 ENCOUNTER — Other Ambulatory Visit: Payer: Self-pay | Admitting: *Deleted

## 2015-01-14 MED ORDER — MIRABEGRON ER 50 MG PO TB24: ORAL_TABLET | ORAL | Status: DC

## 2015-01-14 NOTE — Telephone Encounter (Signed)
Jane [name] daughter requested and i faxed to pharmacy. Daughter stated that the results from the medication was good.

## 2015-01-22 ENCOUNTER — Other Ambulatory Visit: Payer: Self-pay | Admitting: Internal Medicine

## 2015-02-01 DIAGNOSIS — H4011X2 Primary open-angle glaucoma, moderate stage: Secondary | ICD-10-CM | POA: Diagnosis not present

## 2015-02-01 DIAGNOSIS — Z961 Presence of intraocular lens: Secondary | ICD-10-CM | POA: Diagnosis not present

## 2015-02-01 DIAGNOSIS — H43813 Vitreous degeneration, bilateral: Secondary | ICD-10-CM | POA: Diagnosis not present

## 2015-02-01 DIAGNOSIS — H3531 Nonexudative age-related macular degeneration: Secondary | ICD-10-CM | POA: Diagnosis not present

## 2015-02-15 ENCOUNTER — Encounter: Payer: Self-pay | Admitting: *Deleted

## 2015-02-22 ENCOUNTER — Ambulatory Visit (INDEPENDENT_AMBULATORY_CARE_PROVIDER_SITE_OTHER): Payer: Medicare Other | Admitting: Internal Medicine

## 2015-02-22 ENCOUNTER — Encounter: Payer: Self-pay | Admitting: Internal Medicine

## 2015-02-22 VITALS — BP 128/66 | HR 84 | Temp 98.0°F | Ht 63.0 in | Wt 111.0 lb

## 2015-02-22 DIAGNOSIS — F329 Major depressive disorder, single episode, unspecified: Secondary | ICD-10-CM | POA: Diagnosis not present

## 2015-02-22 DIAGNOSIS — F32A Depression, unspecified: Secondary | ICD-10-CM

## 2015-02-22 DIAGNOSIS — G8929 Other chronic pain: Secondary | ICD-10-CM

## 2015-02-22 DIAGNOSIS — R1031 Right lower quadrant pain: Secondary | ICD-10-CM

## 2015-02-22 DIAGNOSIS — I10 Essential (primary) hypertension: Secondary | ICD-10-CM

## 2015-02-22 DIAGNOSIS — Z23 Encounter for immunization: Secondary | ICD-10-CM

## 2015-02-22 DIAGNOSIS — R739 Hyperglycemia, unspecified: Secondary | ICD-10-CM

## 2015-02-22 DIAGNOSIS — S20211A Contusion of right front wall of thorax, initial encounter: Secondary | ICD-10-CM

## 2015-02-22 DIAGNOSIS — R7309 Other abnormal glucose: Secondary | ICD-10-CM | POA: Diagnosis not present

## 2015-02-22 DIAGNOSIS — E039 Hypothyroidism, unspecified: Secondary | ICD-10-CM | POA: Diagnosis not present

## 2015-02-22 DIAGNOSIS — R350 Frequency of micturition: Secondary | ICD-10-CM

## 2015-02-22 MED ORDER — QUETIAPINE FUMARATE 50 MG PO TABS: 50.0000 mg | ORAL_TABLET | Freq: Every day | ORAL | Status: DC

## 2015-02-22 MED ORDER — ZOSTER VACCINE LIVE 19400 UNT/0.65ML ~~LOC~~ SOLR: 0.6500 mL | Freq: Once | SUBCUTANEOUS | Status: DC

## 2015-02-22 MED ORDER — HYDROCODONE-ACETAMINOPHEN 5-325 MG PO TABS: ORAL_TABLET | ORAL | Status: DC

## 2015-02-22 NOTE — Progress Notes (Signed)
Patient ID: Jane [name], female   DOB: November 20, 1924, 81 y.o.   MRN: [id number]   Location:  [facility]  Code Status: DNR Goals of Care: Advanced Directive information Does patient have an advance directive?: Yes, Type of Advance Directive: Healthcare Power of [place];Out of facility DNR (pink MOST or yellow form), Pre-existing out of facility DNR order (yellow form or pink MOST form): Pink MOST form placed in chart (order not valid for inpatient use), Does patient want to make changes to advanced directive?: No - Patient declined   Allergies  Allergen Reactions  . Celebrex [Celecoxib]     unknown  . Chlorzoxazone     unknown    Chief Complaint  Patient presents with  . Medical Management of Chronic Issues    3 month follow-up. Pain in RLQ x 2 weeks, pain radiates into back.   . Depression    Patient is depressed, bored, and over-reacting     HPI: Patient is a 81 y.o.  seen in the office today for med mgt of chronic diseases.    Right lower quadrant and in back sore.  Slid out of the bed and landed on her knees.  Left knee still hurts some.  Happened about 3 weeks ago.  Abd pain and back pain wakes her up at night.  Takes a pain pill--helps some.  Not able to go back to sleep at 1am.  Sometimes has dysuria.  Does go pretty often, and if doesn't has incontinence.  Is going more often than usual.    Says her mood is not good, she is very bored.  Says there is not much going on.  Goes to bingo twice a week.  Says there are not enough activities.  Took a walk yesterday.  Admits she's been crying.  A lot.  No SI.  Has thought about running away.  Does have a granddaughter who works at her facility.   Her granddaughter calls her nightly and visits at least once a week.    Just had hearing aides adjusted.  Did lose some more hearing. Gained some weight back so helped a little bit.   Still HOH, but better after second adjustment.    Has cut back on  pain pills which has been an improvement.    Review of Systems:  Review of Systems  Constitutional: Negative for fever and chills.  HENT: Positive for hearing loss. Negative for congestion.   Eyes: Positive for blurred vision.  Respiratory: Negative for shortness of breath.   Cardiovascular: Negative for chest pain and leg swelling.  Gastrointestinal: Negative for abdominal pain.  Genitourinary: Positive for frequency and flank pain. Negative for dysuria, urgency and hematuria.  Musculoskeletal: Negative for myalgias and falls.  Skin: Negative for rash.  Neurological: Negative for dizziness.  Psychiatric/Behavioral: Positive for depression and memory loss. Negative for hallucinations. The patient is nervous/anxious and has insomnia.      Past Medical History  Diagnosis Date  . Abdominal pain, epigastric   . Impacted cerumen   . Abnormality of gait   . Anal fissure   . Loss of weight   . Hyposmolality and/or hyponatremia   . Pain in limb   . Other vitamin B12 deficiency anemia   . Chest pain, unspecified   . Anemia, unspecified   . Anxiety state, unspecified   . Abdominal pain, right upper quadrant   . Other pulmonary embolism and infarction   . Intestinal or peritoneal adhesions with  obstruction (postoperative) (postinfection)   . Long term (current) use of anticoagulants   . Abdominal pain, right lower quadrant   . Essential and other specified forms of tremor   . Lumbago   . Unspecified hypothyroidism   . Major depressive disorder, single episode, unspecified   . Other and unspecified hyperlipidemia   . Macular degeneration (senile) of retina, unspecified   . Unspecified glaucoma   . Unspecified cataract   . Unspecified hearing loss   . Unspecified essential hypertension   . Osteoarthrosis, unspecified whether generalized or localized, unspecified site   . Osteoporosis, unspecified   . Abnormal involuntary movements(781.0)   . Emphysema of lung     Past Surgical  History  Procedure Laterality Date  . Excisional hemorrhoidectomy  1980  . Bladder suspension  1993  . Back surgery  2003  . Abdominal hysterectomy    . Trigger finger release  2009    Dr Teressa Senter  . Intramedullary (im) nail intertrochanteric Right 01/27/2014    Procedure: INTRAMEDULLARY (IM) NAIL INTERTROCHANTRIC;  Surgeon: Sheral Apley, MD;  Location: [facility] OR;  Service: Orthopedics;  Laterality: Right;  . Intramedullary (im) nail intertrochanteric Left 06/26/2014    Procedure: LEFT HIP INTRAMEDULLARY (IM) NAIL;  Surgeon: Cheral Almas, MD;  Location: [facility] OR;  Service: Orthopedics;  Laterality: Left;    Social History:   reports that she has quit smoking. Her smoking use included Cigarettes. She quit after 40 years of use. She does not have any smokeless tobacco history on file. She reports that she does not drink alcohol or use illicit drugs.  Family History  Problem Relation Age of Onset  . Congestive Heart Failure Mother   . Macular degeneration Mother   . Heart disease Mother   . Cancer Sister     lung cancer  . Arthritis Daughter   . Heart disease Daughter   . Glaucoma Sister   . Cataracts Sister     Medications: Patient's Medications  New Prescriptions   No medications on file  Previous Medications   ALBUTEROL (PROVENTIL HFA;VENTOLIN HFA) 108 (90 BASE) MCG/ACT INHALER    Inhale 2 puffs into the lungs every 6 (six) hours as needed. Shortness of breath.   AMLODIPINE (NORVASC) 5 MG TABLET    Take 1 tablet (5 mg total) by mouth daily.   CALCIUM CARBONATE (TUMS - DOSED IN MG ELEMENTAL CALCIUM) 500 MG CHEWABLE TABLET    Chew 1 tablet by mouth daily as needed for indigestion or heartburn.   CALCIUM-VITAMIN D (OSCAL WITH D) 500-200 MG-UNIT PER TABLET    Take 1 tablet by mouth daily.   DICYCLOMINE (BENTYL) 10 MG CAPSULE    Take 10 mg by mouth at bedtime.   FEXOFENADINE (ALLEGRA) 180 MG TABLET    Take 180 mg by mouth daily as needed for allergies or rhinitis.   HYDROCORTISONE  (ANUSOL-HC) 2.5 % RECTAL CREAM    Place 1 application rectally daily as needed.   LATANOPROST (XALATAN) 0.005 % OPHTHALMIC SOLUTION    Place 1 drop into both eyes at bedtime.   LEVOTHYROXINE (SYNTHROID, LEVOTHROID) 75 MCG TABLET    Take 1 tablet (75 mcg total) by mouth daily before breakfast.   LORAZEPAM (ATIVAN) 0.5 MG TABLET    TAKE TWO TABLETS BY MOUTH AT BEDTIME TO HELP REST   LOVASTATIN (MEVACOR) 20 MG TABLET    Take 1 tablet (20 mg total) by mouth every evening.   MIRABEGRON ER (MYRBETRIQ) 50 MG TB24 TABLET  Take one tablet by mouth once daily for bladder   MULTIPLE VITAMINS-MINERALS (ICAPS LUTEIN & OMEGA-3) CAPS    Take 1 capsule by mouth daily.   POLYETHYLENE GLYCOL (MIRALAX / GLYCOLAX) PACKET    Take 17 g by mouth daily as needed for mild constipation.   QUETIAPINE (SEROQUEL) 25 MG TABLET    TAKE ONE TABLET BY MOUTH AT BEDTIME FOR DEPRESSION   SERTRALINE (ZOLOFT) 100 MG TABLET    Take one tablet by mouth once daily   VITAMIN B-12 (CYANOCOBALAMIN) 100 MCG TABLET    Take 1 tablet (100 mcg total) by mouth daily.  Modified Medications   Modified Medication Previous Medication   HYDROCODONE-ACETAMINOPHEN (NORCO/VICODIN) 5-325 MG PER TABLET HYDROcodone-acetaminophen (NORCO/VICODIN) 5-325 MG per tablet      Take one tablet by mouth three times daily with meals and One tablet at bedtime for pain    Take one tablet by mouth three times daily with meals and One tablet at bedtime for pain   ZOSTER VACCINE LIVE, PF, (ZOSTAVAX) 82423 UNT/0.65ML INJECTION zoster vaccine live, PF, (ZOSTAVAX) 53614 UNT/0.65ML injection      Inject 19,400 Units into the skin once.    Inject 0.65 mLs into the skin once.  Discontinued Medications   No medications on file     Physical Exam: Filed Vitals:   02/22/15 1328  BP: 128/66  Pulse: 84  Temp: 98 F (36.7 C)  TempSrc: Oral  Height: 5\' 3"  (1.6 m)  Weight: 111 lb (50.349 kg)  SpO2: 94%  Physical Exam  Constitutional:  Frail white female     Cardiovascular: Normal rate, regular rhythm, normal heart sounds and intact distal pulses.   Pulmonary/Chest: Effort normal and breath sounds normal. No respiratory distress. She has no rales.  Abdominal: Soft. Bowel sounds are normal. She exhibits no distension and no mass. There is tenderness. There is no rebound and no guarding. A hernia is present.  RLQ  Musculoskeletal: Normal range of motion.  Neurological: She is alert.  Oriented to person and place, not time, repeats self and cannot provide history without help of her daughter  Skin: Skin is warm and dry. There is pallor.  Psychiatric: She has a normal mood and affect.    Labs reviewed: Basic Metabolic Panel:  Recent Labs  1204  06/27/14 0655 06/28/14 0404 09/15/14 1410  NA 143  < > 140 139 141  K 4.4  < > 4.1 3.5* 4.4  CL 99  < > 105 103 100  CO2 26  < > 25 25 26   GLUCOSE 82  < > 109* 98 102*  BUN 23  < > 17 14 16   CREATININE 0.81  < > 0.79 0.71 0.81  CALCIUM 9.9  < > 8.0* 8.3* 9.4  TSH 3.050  --   --   --   --   < > = values in this interval not displayed. Liver Function Tests:  Recent Labs  06/27/14 0655 06/28/14 0404 09/15/14 1410  AST 13 16 17   ALT 6 <5 11  ALKPHOS 45 47 87  BILITOT 0.3 0.5 0.3  PROT 4.7* 5.0* 6.6  ALBUMIN 2.6* 2.8*  --    No results for input(s): LIPASE, AMYLASE in the last 8760 hours. No results for input(s): AMMONIA in the last 8760 hours. CBC:  Recent Labs  06/25/14 1145  06/27/14 1145 06/28/14 0836 06/29/14 0512 09/15/14 1410  WBC 12.3*  < > 5.6 6.9 5.7 5.8  NEUTROABS 11.0*  --   --   --   --  3.8  HGB 11.7*  < > 6.3* 10.6* 8.6* 12.8  HCT 34.6*  < > 18.9* 31.4* 25.3* 39.7  MCV 91.5  < > 91.7 85.3 86.1 93  PLT 170  < > 99* 129* 146*  --   < > = values in this interval not displayed. Lipid Panel: No results for input(s): CHOL, HDL, LDLCALC, TRIG, CHOLHDL, LDLDIRECT in the last 8760 hours. No results found for: HGBA1C   Assessment/Plan 1. Depression - had  been doing better for some time, but now admits to feelings of hopelessness, boredom, tearfulness, and  Scored 6 on phq-9 today -had been on 100mg  of seroquel in past (had been for delirium, but has been adjunct for her depression) and [name] feels like her moods were better then--it was reduced during her rehab stay post hospitalization and hadn't needed to be increased until now (pt finally admitted to her symptoms) - QUEtiapine (SEROQUEL) 50 MG tablet; Take 1 tablet (50 mg total) by mouth daily.  Dispense: 30 tablet; Refill: 3  2. Urinary frequency - combined with the RLQ abdominal pain and flank pain so will r/o UTI and see where glucose levels have been running - Hemoglobin A1c - Urinalysis with Reflex Microscopic - Urine culture  3. Abdominal pain, chronic, right lower quadrant -unusual, but may be from bladder vs. Radiating from her back/ribs due to fall she had - CBC with Differential/Platelet - Comprehensive metabolic panel - Urinalysis with Reflex Microscopic - Urine culture -if urine unremarkable, will need to do CT abd/pelvis to assess   4. Rib contusion, right, initial encounter -ribs tender on posterior right--says she slid down side of bed striking ribs, no bruising, but they are tender (vs. Flank pain)--hard to tell here  5. Hypothyroidism, unspecified hypothyroidism type -f/u labs, cont synthroid - TSH  6. Essential hypertension, benign -bp at goal, no changes - CBC with Differential/Platelet - Comprehensive metabolic panel - Hemoglobin A1c  7. Hyperglycemia -historically--felt to be related to antipsychotic  - Hemoglobin A1c  8. Need for vaccination with 13-polyvalent pneumococcal conjugate vaccine -prevnar given - Pneumococcal conjugate vaccine 13-valent   Labs/tests ordered:   Orders Placed This Encounter  Procedures  . Urine culture  . Pneumococcal conjugate vaccine 13-valent  . CBC with Differential/Platelet  . Comprehensive metabolic panel  .  Hemoglobin A1c  . TSH  . Urinalysis with Reflex Microscopic    Next appt:  3 mos  Jolly Carlini L. Calypso Hagarty, D.O. Geriatrics [facility] [address], [place] [address] Cell Phone (Mon-Fri 8am-5pm):  [phone] On Call:  [phone] & follow prompts after 5pm & weekends Office Phone:  [phone] Office Fax:  [phone]

## 2015-02-23 LAB — CBC WITH DIFFERENTIAL/PLATELET
Basophils Absolute: 0 10*3/uL (ref 0.0–0.2)
Basos: 0 %
Eos: 1 %
Eosinophils Absolute: 0.1 10*3/uL (ref 0.0–0.4)
HCT: 35.8 % (ref 34.0–46.6)
Hemoglobin: 12 g/dL (ref 11.1–15.9)
Immature Grans (Abs): 0 10*3/uL (ref 0.0–0.1)
Immature Granulocytes: 0 %
Lymphocytes Absolute: 1.2 10*3/uL (ref 0.7–3.1)
Lymphs: 18 %
MCH: 30.8 pg (ref 26.6–33.0)
MCHC: 33.5 g/dL (ref 31.5–35.7)
MCV: 92 fL (ref 79–97)
Monocytes Absolute: 0.4 10*3/uL (ref 0.1–0.9)
Monocytes: 6 %
Neutrophils Absolute: 4.9 10*3/uL (ref 1.4–7.0)
Neutrophils Relative %: 75 %
Platelets: 207 10*3/uL (ref 150–379)
RBC: 3.89 x10E6/uL (ref 3.77–5.28)
RDW: 13.9 % (ref 12.3–15.4)
WBC: 6.5 10*3/uL (ref 3.4–10.8)

## 2015-02-23 LAB — HEMOGLOBIN A1C
Est. average glucose Bld gHb Est-mCnc: 120 mg/dL
Hgb A1c MFr Bld: 5.8 % — ABNORMAL HIGH (ref 4.8–5.6)

## 2015-02-23 LAB — COMPREHENSIVE METABOLIC PANEL
ALT: 10 IU/L (ref 0–32)
AST: 17 IU/L (ref 0–40)
Albumin/Globulin Ratio: 2.5 (ref 1.1–2.5)
Albumin: 4.5 g/dL (ref 3.5–4.7)
Alkaline Phosphatase: 70 IU/L (ref 39–117)
BUN/Creatinine Ratio: 24 (ref 11–26)
BUN: 18 mg/dL (ref 8–27)
Bilirubin Total: 0.4 mg/dL (ref 0.0–1.2)
CO2: 26 mmol/L (ref 18–29)
Calcium: 9.6 mg/dL (ref 8.7–10.3)
Chloride: 99 mmol/L (ref 97–108)
Creatinine, Ser: 0.75 mg/dL (ref 0.57–1.00)
GFR calc Af Amer: 83 mL/min/{1.73_m2} (ref >59)
GFR calc non Af Amer: 72 mL/min/{1.73_m2} (ref >59)
Globulin, Total: 1.8 g/dL (ref 1.5–4.5)
Glucose: 114 mg/dL — ABNORMAL HIGH (ref 65–99)
Potassium: 4.4 mmol/L (ref 3.5–5.2)
Sodium: 139 mmol/L (ref 134–144)
Total Protein: 6.3 g/dL (ref 6.0–8.5)

## 2015-02-23 LAB — TSH: TSH: 15.73 u[IU]/mL — ABNORMAL HIGH (ref 0.450–4.500)

## 2015-02-24 ENCOUNTER — Other Ambulatory Visit: Payer: Self-pay | Admitting: *Deleted

## 2015-02-24 ENCOUNTER — Other Ambulatory Visit: Payer: Self-pay | Admitting: Internal Medicine

## 2015-02-24 DIAGNOSIS — E038 Other specified hypothyroidism: Secondary | ICD-10-CM

## 2015-02-24 MED ORDER — LEVOTHYROXINE SODIUM 88 MCG PO TABS: ORAL_TABLET | ORAL | Status: DC

## 2015-02-24 NOTE — Telephone Encounter (Signed)
Patient Notified of labs and medication faxed to pharmacy and order placed and appointment scheduled for labs

## 2015-02-25 LAB — URINE CULTURE

## 2015-02-26 ENCOUNTER — Emergency Department (HOSPITAL_COMMUNITY): Payer: Medicare Other

## 2015-02-26 ENCOUNTER — Encounter (HOSPITAL_COMMUNITY): Payer: Self-pay

## 2015-02-26 ENCOUNTER — Inpatient Hospital Stay (HOSPITAL_COMMUNITY)
Admission: EM | Admit: 2015-02-26 | Discharge: 2015-03-01 | DRG: 690 | Disposition: A | Discharge status: Nursing Home (Includes ICF) | Payer: Medicare Other | Attending: Internal Medicine | Admitting: Internal Medicine

## 2015-02-26 DIAGNOSIS — J45909 Unspecified asthma, uncomplicated: Secondary | ICD-10-CM | POA: Diagnosis present

## 2015-02-26 DIAGNOSIS — R55 Syncope and collapse: Secondary | ICD-10-CM | POA: Diagnosis not present

## 2015-02-26 DIAGNOSIS — R42 Dizziness and giddiness: Secondary | ICD-10-CM

## 2015-02-26 DIAGNOSIS — Z888 Allergy status to other drugs, medicaments and biological substances status: Secondary | ICD-10-CM | POA: Diagnosis not present

## 2015-02-26 DIAGNOSIS — M810 Age-related osteoporosis without current pathological fracture: Secondary | ICD-10-CM | POA: Diagnosis present

## 2015-02-26 DIAGNOSIS — M1990 Unspecified osteoarthritis, unspecified site: Secondary | ICD-10-CM | POA: Diagnosis present

## 2015-02-26 DIAGNOSIS — E039 Hypothyroidism, unspecified: Secondary | ICD-10-CM | POA: Diagnosis present

## 2015-02-26 DIAGNOSIS — H269 Unspecified cataract: Secondary | ICD-10-CM | POA: Diagnosis present

## 2015-02-26 DIAGNOSIS — H9190 Unspecified hearing loss, unspecified ear: Secondary | ICD-10-CM | POA: Diagnosis present

## 2015-02-26 DIAGNOSIS — H409 Unspecified glaucoma: Secondary | ICD-10-CM | POA: Diagnosis present

## 2015-02-26 DIAGNOSIS — B3731 Acute candidiasis of vulva and vagina: Secondary | ICD-10-CM | POA: Diagnosis present

## 2015-02-26 DIAGNOSIS — I10 Essential (primary) hypertension: Secondary | ICD-10-CM | POA: Diagnosis not present

## 2015-02-26 DIAGNOSIS — Z801 Family history of malignant neoplasm of trachea, bronchus and lung: Secondary | ICD-10-CM

## 2015-02-26 DIAGNOSIS — B952 Enterococcus as the cause of diseases classified elsewhere: Secondary | ICD-10-CM | POA: Diagnosis present

## 2015-02-26 DIAGNOSIS — Z86711 Personal history of pulmonary embolism: Secondary | ICD-10-CM | POA: Diagnosis not present

## 2015-02-26 DIAGNOSIS — F29 Unspecified psychosis not due to a substance or known physiological condition: Secondary | ICD-10-CM | POA: Diagnosis present

## 2015-02-26 DIAGNOSIS — Z66 Do not resuscitate: Secondary | ICD-10-CM | POA: Diagnosis present

## 2015-02-26 DIAGNOSIS — Z8261 Family history of arthritis: Secondary | ICD-10-CM

## 2015-02-26 DIAGNOSIS — Z7901 Long term (current) use of anticoagulants: Secondary | ICD-10-CM

## 2015-02-26 DIAGNOSIS — Z83518 Family history of other specified eye disorder: Secondary | ICD-10-CM

## 2015-02-26 DIAGNOSIS — R410 Disorientation, unspecified: Secondary | ICD-10-CM | POA: Diagnosis present

## 2015-02-26 DIAGNOSIS — F329 Major depressive disorder, single episode, unspecified: Secondary | ICD-10-CM | POA: Diagnosis present

## 2015-02-26 DIAGNOSIS — Z79899 Other long term (current) drug therapy: Secondary | ICD-10-CM | POA: Diagnosis not present

## 2015-02-26 DIAGNOSIS — Z8249 Family history of ischemic heart disease and other diseases of the circulatory system: Secondary | ICD-10-CM | POA: Diagnosis not present

## 2015-02-26 DIAGNOSIS — B373 Candidiasis of vulva and vagina: Secondary | ICD-10-CM | POA: Diagnosis present

## 2015-02-26 DIAGNOSIS — R531 Weakness: Secondary | ICD-10-CM

## 2015-02-26 DIAGNOSIS — Z87891 Personal history of nicotine dependence: Secondary | ICD-10-CM | POA: Diagnosis not present

## 2015-02-26 DIAGNOSIS — R51 Headache: Secondary | ICD-10-CM | POA: Diagnosis not present

## 2015-02-26 DIAGNOSIS — N390 Urinary tract infection, site not specified: Principal | ICD-10-CM | POA: Diagnosis present

## 2015-02-26 DIAGNOSIS — J449 Chronic obstructive pulmonary disease, unspecified: Secondary | ICD-10-CM | POA: Diagnosis present

## 2015-02-26 DIAGNOSIS — E538 Deficiency of other specified B group vitamins: Secondary | ICD-10-CM | POA: Diagnosis present

## 2015-02-26 DIAGNOSIS — J4489 Other specified chronic obstructive pulmonary disease: Secondary | ICD-10-CM | POA: Diagnosis present

## 2015-02-26 DIAGNOSIS — H3530 Unspecified macular degeneration: Secondary | ICD-10-CM | POA: Diagnosis present

## 2015-02-26 DIAGNOSIS — R404 Transient alteration of awareness: Secondary | ICD-10-CM | POA: Diagnosis not present

## 2015-02-26 DIAGNOSIS — D509 Iron deficiency anemia, unspecified: Secondary | ICD-10-CM | POA: Diagnosis not present

## 2015-02-26 DIAGNOSIS — Z79891 Long term (current) use of opiate analgesic: Secondary | ICD-10-CM

## 2015-02-26 DIAGNOSIS — G319 Degenerative disease of nervous system, unspecified: Secondary | ICD-10-CM | POA: Diagnosis present

## 2015-02-26 DIAGNOSIS — R9082 White matter disease, unspecified: Secondary | ICD-10-CM | POA: Diagnosis present

## 2015-02-26 DIAGNOSIS — J9811 Atelectasis: Secondary | ICD-10-CM | POA: Diagnosis not present

## 2015-02-26 DIAGNOSIS — E785 Hyperlipidemia, unspecified: Secondary | ICD-10-CM

## 2015-02-26 DIAGNOSIS — J90 Pleural effusion, not elsewhere classified: Secondary | ICD-10-CM | POA: Diagnosis not present

## 2015-02-26 DIAGNOSIS — F32A Depression, unspecified: Secondary | ICD-10-CM | POA: Diagnosis present

## 2015-02-26 HISTORY — DX: Essential (primary) hypertension: I10

## 2015-02-26 HISTORY — DX: Laceration without foreign body of left ear, initial encounter: S01.312A

## 2015-02-26 HISTORY — DX: Fracture of unspecified part of neck of left femur, initial encounter for closed fracture: S72.002A

## 2015-02-26 HISTORY — DX: Hyperlipidemia, unspecified: E78.5

## 2015-02-26 HISTORY — DX: Pneumonia, unspecified organism: J18.9

## 2015-02-26 HISTORY — DX: Gastritis, unspecified, without bleeding: K29.70

## 2015-02-26 HISTORY — DX: Acute posthemorrhagic anemia: D62

## 2015-02-26 LAB — COMPREHENSIVE METABOLIC PANEL
ALT: 13 U/L (ref 0–35)
AST: 17 U/L (ref 0–37)
Albumin: 4.4 g/dL (ref 3.5–5.2)
Alkaline Phosphatase: 70 U/L (ref 39–117)
Anion gap: 7 (ref 5–15)
BUN: 21 mg/dL (ref 6–23)
CO2: 28 mmol/L (ref 19–32)
CREATININE: 0.76 mg/dL (ref 0.50–1.10)
Calcium: 9.5 mg/dL (ref 8.4–10.5)
Chloride: 106 mmol/L (ref 96–112)
GFR, EST AFRICAN AMERICAN: 85 mL/min — AB (ref >90)
GFR, EST NON AFRICAN AMERICAN: 74 mL/min — AB (ref >90)
Glucose, Bld: 101 mg/dL — ABNORMAL HIGH (ref 70–99)
Potassium: 4.1 mmol/L (ref 3.5–5.1)
Sodium: 141 mmol/L (ref 135–145)
TOTAL PROTEIN: 6.3 g/dL (ref 6.0–8.3)
Total Bilirubin: 0.5 mg/dL (ref 0.3–1.2)

## 2015-02-26 LAB — CBC
HEMATOCRIT: 34.7 % — AB (ref 36.0–46.0)
Hemoglobin: 11.6 g/dL — ABNORMAL LOW (ref 12.0–15.0)
MCH: 31.3 pg (ref 26.0–34.0)
MCHC: 33.4 g/dL (ref 30.0–36.0)
MCV: 93.5 fL (ref 78.0–100.0)
PLATELETS: 181 10*3/uL (ref 150–400)
RBC: 3.71 MIL/uL — ABNORMAL LOW (ref 3.87–5.11)
RDW: 13.5 % (ref 11.5–15.5)
WBC: 5.2 10*3/uL (ref 4.0–10.5)

## 2015-02-26 LAB — I-STAT CG4 LACTIC ACID, ED: Lactic Acid, Venous: 0.39 mmol/L — ABNORMAL LOW (ref 0.5–2.0)

## 2015-02-26 LAB — URINALYSIS, ROUTINE W REFLEX MICROSCOPIC
Bilirubin Urine: NEGATIVE
Glucose, UA: NEGATIVE mg/dL
Hgb urine dipstick: NEGATIVE
Ketones, ur: NEGATIVE mg/dL
LEUKOCYTES UA: NEGATIVE
NITRITE: NEGATIVE
PH: 7 (ref 5.0–8.0)
Protein, ur: NEGATIVE mg/dL
UROBILINOGEN UA: 0.2 mg/dL (ref 0.0–1.0)

## 2015-02-26 LAB — I-STAT TROPONIN, ED: Troponin i, poc: 0.01 ng/mL (ref 0.00–0.08)

## 2015-02-26 NOTE — ED Provider Notes (Signed)
CSN: [id number]     Arrival date & time 02/26/15  2135 History   First MD Initiated Contact with Patient 02/26/15 2147     Chief Complaint  Patient presents with  . Weakness     (Consider location/radiation/quality/duration/timing/severity/associated sxs/prior Treatment) Patient is a 81 y.o. female presenting with weakness. The history is provided by the patient.  Weakness This is a new problem. The current episode started 3 to 5 hours ago. The problem occurs constantly. The problem has been gradually improving. Pertinent negatives include no chest pain, no abdominal pain and no shortness of breath. Nothing aggravates the symptoms. Nothing relieves the symptoms.    Past Medical History  Diagnosis Date  . Abdominal pain, epigastric   . Impacted cerumen   . Abnormality of gait   . Anal fissure   . Loss of weight   . Hyposmolality and/or hyponatremia   . Pain in limb   . Other vitamin B12 deficiency anemia   . Chest pain, unspecified   . Anemia, unspecified   . Anxiety state, unspecified   . Abdominal pain, right upper quadrant   . Other pulmonary embolism and infarction   . Intestinal or peritoneal adhesions with obstruction (postoperative) (postinfection)   . Long term (current) use of anticoagulants   . Abdominal pain, right lower quadrant   . Essential and other specified forms of tremor   . Lumbago   . Unspecified hypothyroidism   . Major depressive disorder, single episode, unspecified   . Other and unspecified hyperlipidemia   . Macular degeneration (senile) of retina, unspecified   . Unspecified glaucoma   . Unspecified cataract   . Unspecified hearing loss   . Unspecified essential hypertension   . Osteoarthrosis, unspecified whether generalized or localized, unspecified site   . Osteoporosis, unspecified   . Abnormal involuntary movements(781.0)   . Emphysema of lung    Past Surgical History  Procedure Laterality Date  . Excisional hemorrhoidectomy  1980  .  Bladder suspension  1993  . Back surgery  2003  . Abdominal hysterectomy    . Trigger finger release  2009    Dr Teressa Senter  . Intramedullary (im) nail intertrochanteric Right 01/27/2014    Procedure: INTRAMEDULLARY (IM) NAIL INTERTROCHANTRIC;  Surgeon: Sheral Apley, MD;  Location: [facility] OR;  Service: Orthopedics;  Laterality: Right;  . Intramedullary (im) nail intertrochanteric Left 06/26/2014    Procedure: LEFT HIP INTRAMEDULLARY (IM) NAIL;  Surgeon: Cheral Almas, MD;  Location: [facility] OR;  Service: Orthopedics;  Laterality: Left;   Family History  Problem Relation Age of Onset  . Congestive Heart Failure Mother   . Macular degeneration Mother   . Heart disease Mother   . Cancer Sister     lung cancer  . Arthritis Daughter   . Heart disease Daughter   . Glaucoma Sister   . Cataracts Sister    History  Substance Use Topics  . Smoking status: Former Smoker -- 40 years    Types: Cigarettes  . Smokeless tobacco: Not on file  . Alcohol Use: No   OB History    No data available     Review of Systems  Constitutional: Negative for fever.  Respiratory: Negative for cough and shortness of breath.   Cardiovascular: Negative for chest pain.  Gastrointestinal: Negative for vomiting and abdominal pain.  Neurological: Positive for dizziness and weakness.  All other systems reviewed and are negative.     Allergies  Celebrex and Chlorzoxazone  Home Medications  Prior to Admission medications   Medication Sig Start Date End Date Taking? Authorizing Provider  amLODipine (NORVASC) 5 MG tablet Take 1 tablet (5 mg total) by mouth daily. 09/15/14  Yes Mahima Glade Lloyd, MD  calcium-vitamin D (OSCAL WITH D) 500-200 MG-UNIT per tablet Take 1 tablet by mouth daily.   Yes Historical Provider, MD  dicyclomine (BENTYL) 10 MG capsule Take 10 mg by mouth at bedtime.   Yes Historical Provider, MD  fexofenadine (ALLEGRA) 180 MG tablet Take 180 mg by mouth daily as needed for allergies or rhinitis.    Yes Historical Provider, MD  HYDROcodone-acetaminophen (NORCO/VICODIN) 5-325 MG per tablet Take one tablet by mouth three times daily with meals and One tablet at bedtime for pain Patient taking differently: Take 1 tablet by mouth every 6 (six) hours as needed for moderate pain. Take one tablet by mouth three times daily with meals and One tablet at bedtime for pain 02/22/15  Yes Tiffany L Reed, DO  latanoprost (XALATAN) 0.005 % ophthalmic solution Place 1 drop into both eyes at bedtime.   Yes Historical Provider, MD  levothyroxine (SYNTHROID, LEVOTHROID) 88 MCG tablet Take one tablet by mouth 30 minutes before breakfast for thyroid 02/24/15  Yes Tiffany L Reed, DO  LORazepam (ATIVAN) 0.5 MG tablet TAKE TWO TABLETS BY MOUTH AT BEDTIME TO HELP REST 02/24/15  Yes Kimber Relic, MD  lovastatin (MEVACOR) 20 MG tablet Take 1 tablet (20 mg total) by mouth every evening. 09/15/14  Yes Mahima Glade Lloyd, MD  mirabegron ER (MYRBETRIQ) 50 MG TB24 tablet Take one tablet by mouth once daily for bladder 01/14/15  Yes Tiffany L Reed, DO  Multiple Vitamins-Minerals (ICAPS LUTEIN & OMEGA-3) CAPS Take 1 capsule by mouth daily.   Yes Historical Provider, MD  polyethylene glycol (MIRALAX / GLYCOLAX) packet Take 17 g by mouth daily as needed for mild constipation. Patient taking differently: Take 17 g by mouth daily.  06/29/14  Yes Richarda Overlie, MD  QUEtiapine (SEROQUEL) 50 MG tablet Take 1 tablet (50 mg total) by mouth daily. 02/22/15  Yes Tiffany L Reed, DO  sertraline (ZOLOFT) 100 MG tablet Take one tablet by mouth once daily 09/15/14  Yes Mahima Pandey, MD  vitamin B-12 (CYANOCOBALAMIN) 100 MCG tablet Take 1 tablet (100 mcg total) by mouth daily. 09/15/14  Yes Mahima Pandey, MD  albuterol (PROVENTIL HFA;VENTOLIN HFA) 108 (90 BASE) MCG/ACT inhaler Inhale 2 puffs into the lungs every 6 (six) hours as needed. Shortness of breath. 04/13/14   Tiffany L Reed, DO  calcium carbonate (TUMS - DOSED IN MG ELEMENTAL CALCIUM) 500 MG  chewable tablet Chew 1 tablet by mouth daily as needed for indigestion or heartburn.    Historical Provider, MD  hydrocortisone (ANUSOL-HC) 2.5 % rectal cream Place 1 application rectally daily as needed.    Historical Provider, MD  zoster vaccine live, PF, (ZOSTAVAX) 04888 UNT/0.65ML injection Inject 19,400 Units into the skin once. Patient not taking: Reported on 02/26/2015 02/22/15   Tiffany L Reed, DO   BP 176/74 mmHg  Pulse 79  Temp(Src) 97.8 F (36.6 C) (Oral)  Resp 18  SpO2 94% Physical Exam  Constitutional: She is oriented to person, place, and time. She appears well-developed and well-nourished. No distress.  HENT:  Head: Normocephalic and atraumatic.  Mouth/Throat: Oropharynx is clear and moist.  Eyes: EOM are normal. Pupils are equal, round, and reactive to light.  Neck: Normal range of motion. Neck supple.  Cardiovascular: Normal rate and regular rhythm.  Exam reveals no friction  rub.   No murmur heard. Pulmonary/Chest: Effort normal and breath sounds normal. No respiratory distress. She has no wheezes. She has no rales.  Abdominal: Soft. She exhibits no distension. There is no tenderness. There is no rebound.  Musculoskeletal: Normal range of motion. She exhibits no edema.  Neurological: She is alert and oriented to person, place, and time. No cranial nerve deficit. She exhibits normal muscle tone. Coordination normal.  Skin: No rash noted. She is not diaphoretic.  Nursing note and vitals reviewed.   ED Course  Procedures (including critical care time) Labs Review Labs Reviewed  CBC - Abnormal; Notable for the following:    RBC 3.71 (*)    Hemoglobin 11.6 (*)    HCT 34.7 (*)    All other components within normal limits  COMPREHENSIVE METABOLIC PANEL - Abnormal; Notable for the following:    Glucose, Bld 101 (*)    GFR calc non Af Amer 74 (*)    GFR calc Af Amer 85 (*)    All other components within normal limits  URINALYSIS, ROUTINE W REFLEX MICROSCOPIC -  Abnormal; Notable for the following:    Specific Gravity, Urine <1.005 (*)    All other components within normal limits  I-STAT CG4 LACTIC ACID, ED - Abnormal; Notable for the following:    Lactic Acid, Venous 0.39 (*)    All other components within normal limits  I-STAT TROPOININ, ED    Imaging Review Dg Chest 2 View  02/26/2015   CLINICAL DATA:  Weakness.  Dizziness and headache.  EXAM: CHEST  2 VIEW  COMPARISON:  Chest x-rays dated 06/25/2014 and 03/25/2008  FINDINGS: Heart size is normal. Vascularity is slightly prominent. Tiny bilateral effusions. Minimal atelectasis at the right lung base. No acute osseous abnormality.  IMPRESSION: Tiny bilateral effusions with slight right base atelectasis.   Electronically Signed   By: Francene Boyers M.D.   On: 02/26/2015 23:11     EKG Interpretation   Date/Time:  Friday February 26 2015 22:18:05 EDT Ventricular Rate:  78 PR Interval:  175 QRS Duration: 94 QT Interval:  414 QTC Calculation: 472 R Axis:   76 Text Interpretation:  Sinus rhythm Left ventricular hypertrophy No  significant change since last tracing Confirmed by Gwendolyn Grant  MD, Kumari Sculley  ([id number]) on 02/26/2015 10:37:30 PM      MDM   Final diagnoses:  Weakness  Weakness    81 year old female here with acute onset of dizziness. Began all at rest. She was allegedly hypertensive at that time. He should also complained of headache. Here symptoms have improved. Vitals are stable. She is relaxing comfortably. She has a nonfocal neuro exam. She denies any chest pain, short of breath, fever, belly pain, vomiting, diarrhea, dysuria. EKG normal. Belly exam benign. Lungs are clear. Chest x-ray shows tiny bilateral effusions but no pneumonia. CT head is normal. Admitted for near-syncope.  Elwin Mocha, MD 02/27/15 [phone]

## 2015-02-26 NOTE — ED Notes (Signed)
Bed: WA19 Expected date:  Expected time:  Means of arrival:  Comments: EMS 

## 2015-02-26 NOTE — ED Notes (Signed)
Pt presents via EMS from [facility] with c/o weakness. EMS reported staff went in to give pt her medications and she looked weak and pt complained of dizziness and headache. Staff reported she was hypertensive over 200. Upon EMS arrival, pt no longer had a headache, was not dizzy, and BP had gone down. Pt has no current complaints at this time.

## 2015-02-27 ENCOUNTER — Encounter (HOSPITAL_COMMUNITY): Payer: Self-pay | Admitting: Internal Medicine

## 2015-02-27 ENCOUNTER — Observation Stay (HOSPITAL_COMMUNITY): Payer: Medicare Other

## 2015-02-27 DIAGNOSIS — R55 Syncope and collapse: Secondary | ICD-10-CM

## 2015-02-27 DIAGNOSIS — R42 Dizziness and giddiness: Secondary | ICD-10-CM | POA: Diagnosis not present

## 2015-02-27 DIAGNOSIS — J449 Chronic obstructive pulmonary disease, unspecified: Secondary | ICD-10-CM

## 2015-02-27 DIAGNOSIS — B952 Enterococcus as the cause of diseases classified elsewhere: Secondary | ICD-10-CM

## 2015-02-27 DIAGNOSIS — G319 Degenerative disease of nervous system, unspecified: Secondary | ICD-10-CM | POA: Diagnosis not present

## 2015-02-27 DIAGNOSIS — B373 Candidiasis of vulva and vagina: Secondary | ICD-10-CM

## 2015-02-27 DIAGNOSIS — E538 Deficiency of other specified B group vitamins: Secondary | ICD-10-CM | POA: Diagnosis not present

## 2015-02-27 DIAGNOSIS — N390 Urinary tract infection, site not specified: Secondary | ICD-10-CM | POA: Diagnosis not present

## 2015-02-27 DIAGNOSIS — B3731 Acute candidiasis of vulva and vagina: Secondary | ICD-10-CM | POA: Diagnosis present

## 2015-02-27 DIAGNOSIS — R410 Disorientation, unspecified: Secondary | ICD-10-CM

## 2015-02-27 DIAGNOSIS — E039 Hypothyroidism, unspecified: Secondary | ICD-10-CM

## 2015-02-27 DIAGNOSIS — I10 Essential (primary) hypertension: Secondary | ICD-10-CM | POA: Diagnosis not present

## 2015-02-27 DIAGNOSIS — E785 Hyperlipidemia, unspecified: Secondary | ICD-10-CM

## 2015-02-27 DIAGNOSIS — R51 Headache: Secondary | ICD-10-CM | POA: Diagnosis not present

## 2015-02-27 LAB — COMPREHENSIVE METABOLIC PANEL
ALK PHOS: 62 U/L (ref 39–117)
ALT: 13 U/L (ref 0–35)
ANION GAP: 4 — AB (ref 5–15)
AST: 17 U/L (ref 0–37)
Albumin: 4.1 g/dL (ref 3.5–5.2)
BUN: 19 mg/dL (ref 6–23)
CO2: 27 mmol/L (ref 19–32)
Calcium: 9 mg/dL (ref 8.4–10.5)
Chloride: 107 mmol/L (ref 96–112)
Creatinine, Ser: 0.69 mg/dL (ref 0.50–1.10)
GFR, EST AFRICAN AMERICAN: 88 mL/min — AB (ref >90)
GFR, EST NON AFRICAN AMERICAN: 76 mL/min — AB (ref >90)
GLUCOSE: 92 mg/dL (ref 70–99)
POTASSIUM: 3.9 mmol/L (ref 3.5–5.1)
Sodium: 138 mmol/L (ref 135–145)
Total Bilirubin: 0.6 mg/dL (ref 0.3–1.2)
Total Protein: 6.1 g/dL (ref 6.0–8.3)

## 2015-02-27 LAB — CBC
HEMATOCRIT: 35.6 % — AB (ref 36.0–46.0)
Hemoglobin: 11.9 g/dL — ABNORMAL LOW (ref 12.0–15.0)
MCH: 31.6 pg (ref 26.0–34.0)
MCHC: 33.4 g/dL (ref 30.0–36.0)
MCV: 94.4 fL (ref 78.0–100.0)
PLATELETS: 182 10*3/uL (ref 150–400)
RBC: 3.77 MIL/uL — AB (ref 3.87–5.11)
RDW: 13.6 % (ref 11.5–15.5)
WBC: 5 10*3/uL (ref 4.0–10.5)

## 2015-02-27 LAB — MRSA PCR SCREENING: MRSA by PCR: NEGATIVE

## 2015-02-27 LAB — OCCULT BLOOD X 1 CARD TO LAB, STOOL: FECAL OCCULT BLD: NEGATIVE

## 2015-02-27 LAB — MAGNESIUM: MAGNESIUM: 2 mg/dL (ref 1.5–2.5)

## 2015-02-27 LAB — TROPONIN I: Troponin I: <0.03 ng/mL (ref <0.031)

## 2015-02-27 LAB — TSH: TSH: 19.354 u[IU]/mL — ABNORMAL HIGH (ref 0.350–4.500)

## 2015-02-27 LAB — PHOSPHORUS: PHOSPHORUS: 3.7 mg/dL (ref 2.3–4.6)

## 2015-02-27 MED ORDER — SODIUM CHLORIDE 0.9 % IV SOLN
INTRAVENOUS | Status: DC
Start: 2015-02-27 — End: 2015-02-27
  Administered 2015-02-27: 02:00:00 via INTRAVENOUS

## 2015-02-27 MED ORDER — LORATADINE 10 MG PO TABS
10.0000 mg | ORAL_TABLET | Freq: Every day | ORAL | Status: DC
  Administered 2015-02-27 – 2015-03-01 (×3): 10 mg via ORAL
  Filled 2015-02-27 (×3): qty 1

## 2015-02-27 MED ORDER — ONDANSETRON HCL 4 MG/2ML IJ SOLN: 4.0000 mg | Freq: Four times a day (QID) | INTRAMUSCULAR | Status: DC | PRN

## 2015-02-27 MED ORDER — AMLODIPINE BESYLATE 5 MG PO TABS
5.0000 mg | ORAL_TABLET | Freq: Every day | ORAL | Status: DC
  Administered 2015-02-27 – 2015-03-01 (×3): 5 mg via ORAL
  Filled 2015-02-27 (×3): qty 1

## 2015-02-27 MED ORDER — POLYETHYLENE GLYCOL 3350 17 G PO PACK
17.0000 g | PACK | Freq: Every day | ORAL | Status: DC
  Administered 2015-02-28: 17 g via ORAL
  Filled 2015-02-27 (×3): qty 1

## 2015-02-27 MED ORDER — ALBUTEROL SULFATE (2.5 MG/3ML) 0.083% IN NEBU: 2.5000 mg | INHALATION_SOLUTION | RESPIRATORY_TRACT | Status: DC | PRN

## 2015-02-27 MED ORDER — PRAVASTATIN SODIUM 20 MG PO TABS
20.0000 mg | ORAL_TABLET | Freq: Every day | ORAL | Status: DC
  Administered 2015-02-27 – 2015-02-28 (×2): 20 mg via ORAL
  Filled 2015-02-27 (×2): qty 1

## 2015-02-27 MED ORDER — LEVOTHYROXINE SODIUM 88 MCG PO TABS: 88.0000 ug | ORAL_TABLET | Freq: Every day | ORAL | Status: DC

## 2015-02-27 MED ORDER — ACETAMINOPHEN 650 MG RE SUPP: 650.0000 mg | Freq: Four times a day (QID) | RECTAL | Status: DC | PRN

## 2015-02-27 MED ORDER — LATANOPROST 0.005 % OP SOLN
1.0000 [drp] | Freq: Every day | OPHTHALMIC | Status: DC
  Administered 2015-02-27 – 2015-02-28 (×2): 1 [drp] via OPHTHALMIC
  Filled 2015-02-27 (×2): qty 2.5

## 2015-02-27 MED ORDER — CEFTRIAXONE SODIUM IN DEXTROSE 20 MG/ML IV SOLN
1.0000 g | INTRAVENOUS | Status: DC
  Filled 2015-02-27 (×2): qty 50

## 2015-02-27 MED ORDER — HYDROCODONE-ACETAMINOPHEN 5-325 MG PO TABS
1.0000 | ORAL_TABLET | Freq: Four times a day (QID) | ORAL | Status: DC | PRN
  Administered 2015-02-27 – 2015-03-01 (×5): 1 via ORAL
  Filled 2015-02-27 (×5): qty 1

## 2015-02-27 MED ORDER — ASPIRIN EC 81 MG PO TBEC
81.0000 mg | DELAYED_RELEASE_TABLET | Freq: Every day | ORAL | Status: DC
  Administered 2015-02-27 – 2015-03-01 (×3): 81 mg via ORAL
  Filled 2015-02-27 (×3): qty 1

## 2015-02-27 MED ORDER — LEVOTHYROXINE SODIUM 100 MCG PO TABS
100.0000 ug | ORAL_TABLET | Freq: Every day | ORAL | Status: DC
  Administered 2015-02-27: 100 ug via ORAL
  Filled 2015-02-27: qty 1

## 2015-02-27 MED ORDER — ACETAMINOPHEN 325 MG PO TABS: 650.0000 mg | ORAL_TABLET | Freq: Four times a day (QID) | ORAL | Status: DC | PRN

## 2015-02-27 MED ORDER — QUETIAPINE FUMARATE 25 MG PO TABS
50.0000 mg | ORAL_TABLET | Freq: Every day | ORAL | Status: DC
  Administered 2015-02-27 – 2015-03-01 (×3): 50 mg via ORAL
  Filled 2015-02-27 (×3): qty 2

## 2015-02-27 MED ORDER — ALBUTEROL SULFATE (2.5 MG/3ML) 0.083% IN NEBU: 3.0000 mL | INHALATION_SOLUTION | Freq: Four times a day (QID) | RESPIRATORY_TRACT | Status: DC | PRN

## 2015-02-27 MED ORDER — SODIUM CHLORIDE 0.9 % IV SOLN
1.5000 g | Freq: Four times a day (QID) | INTRAVENOUS | Status: DC
  Administered 2015-02-27 – 2015-03-01 (×8): 1.5 g via INTRAVENOUS
  Filled 2015-02-27 (×10): qty 1.5

## 2015-02-27 MED ORDER — SERTRALINE HCL 100 MG PO TABS
100.0000 mg | ORAL_TABLET | Freq: Every day | ORAL | Status: DC
  Administered 2015-02-27 – 2015-03-01 (×3): 100 mg via ORAL
  Filled 2015-02-27 (×3): qty 1

## 2015-02-27 MED ORDER — ONDANSETRON HCL 4 MG PO TABS: 4.0000 mg | ORAL_TABLET | Freq: Four times a day (QID) | ORAL | Status: DC | PRN

## 2015-02-27 MED ORDER — LEVOTHYROXINE SODIUM 88 MCG PO TABS
88.0000 ug | ORAL_TABLET | Freq: Every day | ORAL | Status: DC
  Administered 2015-02-28 – 2015-03-01 (×2): 88 ug via ORAL
  Filled 2015-02-27 (×2): qty 1

## 2015-02-27 MED ORDER — MIRABEGRON ER 50 MG PO TB24
50.0000 mg | ORAL_TABLET | Freq: Every day | ORAL | Status: DC
  Administered 2015-02-27 – 2015-03-01 (×3): 50 mg via ORAL
  Filled 2015-02-27 (×3): qty 1

## 2015-02-27 MED ORDER — DICYCLOMINE HCL 10 MG PO CAPS
10.0000 mg | ORAL_CAPSULE | Freq: Every day | ORAL | Status: DC
  Administered 2015-02-27 – 2015-02-28 (×3): 10 mg via ORAL
  Filled 2015-02-27 (×3): qty 1

## 2015-02-27 MED ORDER — SODIUM CHLORIDE 0.9 % IJ SOLN
3.0000 mL | Freq: Two times a day (BID) | INTRAMUSCULAR | Status: DC
  Administered 2015-02-27 – 2015-03-01 (×5): 3 mL via INTRAVENOUS

## 2015-02-27 MED ORDER — FLUCONAZOLE 100 MG PO TABS
150.0000 mg | ORAL_TABLET | Freq: Every day | ORAL | Status: DC
  Administered 2015-02-27: 150 mg via ORAL
  Filled 2015-02-27: qty 1.5

## 2015-02-27 NOTE — Progress Notes (Addendum)
Progress Note   Jane [name] QIW:[id number] DOB: 06-05-1925 DOA: 02/26/2015 PCP: Bufford Spikes, DO   Brief Narrative:   Jane [name] is an 81 y.o. female with a PMH of hyponatremia, vitamin B 12 deficiency, pulmonary embolism not currently on blood thinners who was admitted 02/26/15 with a presyncopal episode where she felt lightheaded, hot, and had vertigo with positional changes. She was admitted for further observation/obtaining an MRI of her brain after her case was discussed with the neurologist on call, Dr. Amada Jupiter.  Assessment/Plan:   Principal Problem:   Near syncope - MRI of the brain, carotid Dopplers and 2-D echocardiogram ordered and are pending. - No arrhythmic events on telemetry. - Troponin negative. - It does not appear that orthostatic vital signs were checked on admission. We'll ask the nurses to check now.  Active Problems:   Enterococcal UTI /delerium - Start Unasyn.  Had 2 urine cultures done, 1 grew 50-100K enterococcus.    Vaginal yeast infection - Start Diflucan.    Essential hypertension, benign - Systolic blood pressures 160s-170s. - Currently being managed with Norvasc 5 mg daily. May need dosage adjustment.    Anemia, iron deficiency  - Hemoglobin stable.    Hypothyroidism - TSH markedly elevated. Check free T4  - Continue Synthroid, dosage recently changed from 75 ---> 88 mcg per daughter's report.    Chronic obstructive airway disease with asthma - Continue albuterol as needed.    Hyperlipidemia - Continue statin.   Depression / psychosis - On Seroquel and Zoloft. Ativan currently on hold.    DVT Prophylaxis - SCDs.  Code Status: DNR Family Communication: Daughter, [name] at bedside. Disposition Plan: Home when work up completed, likely 1-2 days.  From [facility] ([facility]   IV Access:    Peripheral IV   Procedures and diagnostic studies:   Dg Chest 2 View 02/26/2015: Tiny bilateral effusions with slight right base  atelectasis.    Ct Head Wo Contrast 02/26/2015: No acute abnormalities. Chronic small vessel ischemic changes. Atrophy.     Medical Consultants:    None.  Anti-Infectives:    None.  Subjective:   Jane [name] had some left hip pain, as well as lower abdomen pain on the left side in the lower abdominal area.  Daughter reports that she was hallucinating music.  Objective:    Filed Vitals:   02/27/15 0008 02/27/15 0110 02/27/15 0210 02/27/15 0417  BP: 154/69 175/78 161/72 162/72  Pulse: 69 71  74  Temp:  98.5 F (36.9 C)  97.7 F (36.5 C)  TempSrc:  Oral  Oral  Resp: 22   20  Height:  5\' 2"  (1.575 m)    Weight:  48.807 kg (107 lb 9.6 oz)    SpO2: 94% 95%  97%    Intake/Output Summary (Last 24 hours) at 02/27/15 0749 Last data filed at 02/27/15 0734  Gross per 24 hour  Intake  257.5 ml  Output    970 ml  Net -712.5 ml    Exam: Gen:  NAD, thin, awake/alert Cardiovascular:  RRR, No M/R/G Respiratory:  Lungs CTAB Gastrointestinal:  Abdomen soft, tender right lower quadrant, + BS Extremities:  No C/E/C   Data Reviewed:    Labs: Basic Metabolic Panel:  Recent Labs Lab 02/22/15 1420 02/26/15 2229 02/27/15 0145  NA 139 141 138  K 4.4 4.1 3.9  CL 99 106 107  CO2 26 28 27   GLUCOSE 114* 101* 92  BUN 18 21 19   CREATININE 0.75  0.76 0.69  CALCIUM 9.6 9.5 9.0  MG  --   --  2.0  PHOS  --   --  3.7   GFR Estimated Creatinine Clearance: 38.2 mL/min (by C-G formula based on Cr of 0.69). Liver Function Tests:  Recent Labs Lab 02/22/15 1420 02/26/15 2229 02/27/15 0145  AST 17 17 17   ALT 10 13 13   ALKPHOS 70 70 62  BILITOT 0.4 0.5 0.6  PROT 6.3 6.3 6.1  ALBUMIN  --  4.4 4.1   CBC:  Recent Labs Lab 02/22/15 1420 02/26/15 2229 02/27/15 0145  WBC 6.5 5.2 5.0  NEUTROABS 4.9  --   --   HGB 12.0 11.6* 11.9*  HCT 35.8 34.7* 35.6*  MCV 92 93.5 94.4  PLT 207 181 182   Cardiac Enzymes:  Recent Labs Lab 02/27/15 0145  TROPONINI <0.03   Thyroid  function studies:  Recent Labs  02/27/15 0145  TSH 19.354*   Sepsis Labs:  Recent Labs Lab 02/22/15 1420 02/26/15 2229 02/26/15 2237 02/27/15 0145  WBC 6.5 5.2  --  5.0  LATICACIDVEN  --   --  0.39*  --    Microbiology Recent Results (from the past 240 hour(s))  Urine culture     Status: Abnormal   Collection Time: 02/22/15  2:21 PM  Result Value Ref Range Status   Urine Culture, Routine Final report (A)  Final   Result 1 Enterococcus species (A)  Final    Comment: 50,000-100,000 colony forming units per mL Note: this isolate is vancomycin-susceptible. This information is provided for epidemiologic purposes only: vancomycin is not among the antibiotics recommended for therapy of urinary tract infections caused by Enterococcus.    RESULT 2 Comment  Final    Comment: Mixed urogenital flora Less than 10,000 colonies/mL    ANTIMICROBIAL SUSCEPTIBILITY Comment  Final    Comment:       ** S = Susceptible; I = Intermediate; R = Resistant **                    P = Positive; N = Negative             MICS are expressed in micrograms per mL    Antibiotic                 RSLT#1    RSLT#2    RSLT#3    RSLT#4 Ciprofloxacin                  S Levofloxacin                   S Nitrofurantoin                 S Penicillin                     S Tetracycline                   R Vancomycin                     S   MRSA PCR Screening     Status: None   Collection Time: 02/27/15  1:47 AM  Result Value Ref Range Status   MRSA by PCR NEGATIVE NEGATIVE Final    Comment:        The GeneXpert MRSA Assay (FDA approved for NASAL specimens only), is one component of a comprehensive MRSA colonization surveillance program. It is  not intended to diagnose MRSA infection nor to guide or monitor treatment for MRSA infections.      Medications:   . amLODipine  5 mg Oral Daily  . aspirin EC  81 mg Oral Daily  . dicyclomine  10 mg Oral QHS  . latanoprost  1 drop Both Eyes QHS  .  levothyroxine  88 mcg Oral QAC breakfast  . loratadine  10 mg Oral Daily  . mirabegron ER  50 mg Oral Daily  . polyethylene glycol  17 g Oral Daily  . pravastatin  20 mg Oral q1800  . QUEtiapine  50 mg Oral Daily  . sertraline  100 mg Oral Daily  . sodium chloride  3 mL Intravenous Q12H   Continuous Infusions: . sodium chloride 50 mL/hr at 02/27/15 0150    Time spent: 35 minutes with > 50% of time discussing current diagnostic test results, clinical impression and plan of care with the patient and her daughter.     Kea Callan  [facility] Pager [phone]. If unable to reach me by pager, please call my cell phone at [phone].  *Please refer to [email or web address], password [email or web address] to get updated schedule on who will round on this patient, as hospitalists switch teams weekly. If 7PM-7AM, please contact night-coverage at [email or web address], password [email or web address] for any overnight needs.  02/27/2015, 7:49 AM

## 2015-02-27 NOTE — H&P (Signed)
PCP: Bufford Spikes, DO    Referring provider Gwendolyn Grant   Chief Complaint:  lightheaded  HPI: Jane [name] is a 81 y.o. female   has a past medical history of Abdominal pain, epigastric; Impacted cerumen; Abnormality of gait; Anal fissure; Loss of weight; Hyposmolality and/or hyponatremia; Pain in limb; Other vitamin B12 deficiency anemia; Chest pain, unspecified; Anemia, unspecified; Anxiety state, unspecified; Abdominal pain, right upper quadrant; Other pulmonary embolism and infarction; Intestinal or peritoneal adhesions with obstruction (postoperative) (postinfection); Long term (current) use of anticoagulants; Abdominal pain, right lower quadrant; Essential and other specified forms of tremor; Lumbago; Unspecified hypothyroidism; Major depressive disorder, single episode, unspecified; Other and unspecified hyperlipidemia; Macular degeneration (senile) of retina, unspecified; Unspecified glaucoma; Unspecified cataract; Unspecified hearing loss; Unspecified essential hypertension; Osteoarthrosis, unspecified whether generalized or localized, unspecified site; Osteoporosis, unspecified; Abnormal involuntary movements(781.0); and Emphysema of lung.   Patient reports being at her baseline when she developed sudden onset of lightheadedness, she felt hot and was worried that she was going to pass out, she reports brief episode of vertigo. Patient states that during a separate doses so she stood up from sitting position. Which may have precipitated this. Altogether the episode took a couple of hours. patietn reports checking her BP at the time and it was 200/84 She reports feeling tired but back to baseline.  She denies any chest pain no nausea no vomiting. Patient at her baseline walks with a walker. She has had falls in the past resulting in hip fractures bilaterally. Denies any fever or chills, no urinary complains. CXR showed atelectasis, no evidence of UTI. Troponin and ECG unremarkable. Currently  BP 154/69 Case has been discussed by me with Dr. Petra Kuba of neurology who this point recommends keeping patient at [facility] long but getting an MRI of the brain in the morning given questionable history of vertigo and elderly female risk factors  Hospitalist was called for admission for presyncope and brief vertigo  Review of Systems:    Pertinent positives include:  Dizziness, lightheadedness  Constitutional:  No weight loss, night sweats, Fevers, chills, fatigue, weight loss  HEENT:  No headaches, Difficulty swallowing,Tooth/dental problems,Sore throat,  No sneezing, itching, ear ache, nasal congestion, post nasal drip,  Cardio-vascular:  No chest pain, Orthopnea, PND, anasarca,  palpitations.no Bilateral lower extremity swelling  GI:  No heartburn, indigestion, abdominal pain, nausea, vomiting, diarrhea, change in bowel habits, loss of appetite, melena, blood in stool, hematemesis Resp:  no shortness of breath at rest. No dyspnea on exertion, No excess mucus, no productive cough, No non-productive cough, No coughing up of blood.No change in color of mucus.No wheezing. Skin:  no rash or lesions. No jaundice GU:  no dysuria, change in color of urine, no urgency or frequency. No straining to urinate.  No flank pain.  Musculoskeletal:  No joint pain or no joint swelling. No decreased range of motion. No back pain.  Psych:  No change in mood or affect. No depression or anxiety. No memory loss.  Neuro: no localizing neurological complaints, no tingling, no weakness, no double vision, no gait abnormality, no slurred speech, no confusion  Otherwise ROS are negative except for above, 10 systems were reviewed  Past Medical History: Past Medical History  Diagnosis Date  . Abdominal pain, epigastric   . Impacted cerumen   . Abnormality of gait   . Anal fissure   . Loss of weight   . Hyposmolality and/or hyponatremia   . Pain in limb   . Other vitamin B12  deficiency anemia   .  Chest pain, unspecified   . Anemia, unspecified   . Anxiety state, unspecified   . Abdominal pain, right upper quadrant   . Other pulmonary embolism and infarction   . Intestinal or peritoneal adhesions with obstruction (postoperative) (postinfection)   . Long term (current) use of anticoagulants   . Abdominal pain, right lower quadrant   . Essential and other specified forms of tremor   . Lumbago   . Unspecified hypothyroidism   . Major depressive disorder, single episode, unspecified   . Other and unspecified hyperlipidemia   . Macular degeneration (senile) of retina, unspecified   . Unspecified glaucoma   . Unspecified cataract   . Unspecified hearing loss   . Unspecified essential hypertension   . Osteoarthrosis, unspecified whether generalized or localized, unspecified site   . Osteoporosis, unspecified   . Abnormal involuntary movements(781.0)   . Emphysema of lung    Past Surgical History  Procedure Laterality Date  . Excisional hemorrhoidectomy  1980  . Bladder suspension  1993  . Back surgery  2003  . Abdominal hysterectomy    . Trigger finger release  2009    Dr Teressa Senter  . Intramedullary (im) nail intertrochanteric Right 01/27/2014    Procedure: INTRAMEDULLARY (IM) NAIL INTERTROCHANTRIC;  Surgeon: Sheral Apley, MD;  Location: [facility] OR;  Service: Orthopedics;  Laterality: Right;  . Intramedullary (im) nail intertrochanteric Left 06/26/2014    Procedure: LEFT HIP INTRAMEDULLARY (IM) NAIL;  Surgeon: Cheral Almas, MD;  Location: [facility] OR;  Service: Orthopedics;  Laterality: Left;     Medications: Prior to Admission medications   Medication Sig Start Date End Date Taking? Authorizing Provider  amLODipine (NORVASC) 5 MG tablet Take 1 tablet (5 mg total) by mouth daily. 09/15/14  Yes Mahima Glade Lloyd, MD  calcium-vitamin D (OSCAL WITH D) 500-200 MG-UNIT per tablet Take 1 tablet by mouth daily.   Yes Historical Provider, MD  dicyclomine (BENTYL) 10 MG capsule Take 10 mg by  mouth at bedtime.   Yes Historical Provider, MD  fexofenadine (ALLEGRA) 180 MG tablet Take 180 mg by mouth daily as needed for allergies or rhinitis.   Yes Historical Provider, MD  HYDROcodone-acetaminophen (NORCO/VICODIN) 5-325 MG per tablet Take one tablet by mouth three times daily with meals and One tablet at bedtime for pain Patient taking differently: Take 1 tablet by mouth every 6 (six) hours as needed for moderate pain. Take one tablet by mouth three times daily with meals and One tablet at bedtime for pain 02/22/15  Yes Tiffany L Reed, DO  latanoprost (XALATAN) 0.005 % ophthalmic solution Place 1 drop into both eyes at bedtime.   Yes Historical Provider, MD  levothyroxine (SYNTHROID, LEVOTHROID) 88 MCG tablet Take one tablet by mouth 30 minutes before breakfast for thyroid 02/24/15  Yes Tiffany L Reed, DO  LORazepam (ATIVAN) 0.5 MG tablet TAKE TWO TABLETS BY MOUTH AT BEDTIME TO HELP REST 02/24/15  Yes Kimber Relic, MD  lovastatin (MEVACOR) 20 MG tablet Take 1 tablet (20 mg total) by mouth every evening. 09/15/14  Yes Mahima Glade Lloyd, MD  mirabegron ER (MYRBETRIQ) 50 MG TB24 tablet Take one tablet by mouth once daily for bladder 01/14/15  Yes Tiffany L Reed, DO  Multiple Vitamins-Minerals (ICAPS LUTEIN & OMEGA-3) CAPS Take 1 capsule by mouth daily.   Yes Historical Provider, MD  polyethylene glycol (MIRALAX / GLYCOLAX) packet Take 17 g by mouth daily as needed for mild constipation. Patient taking differently: Take 17 g  by mouth daily.  06/29/14  Yes Richarda Overlie, MD  QUEtiapine (SEROQUEL) 50 MG tablet Take 1 tablet (50 mg total) by mouth daily. 02/22/15  Yes Tiffany L Reed, DO  sertraline (ZOLOFT) 100 MG tablet Take one tablet by mouth once daily 09/15/14  Yes Mahima Pandey, MD  vitamin B-12 (CYANOCOBALAMIN) 100 MCG tablet Take 1 tablet (100 mcg total) by mouth daily. 09/15/14  Yes Mahima Pandey, MD  albuterol (PROVENTIL HFA;VENTOLIN HFA) 108 (90 BASE) MCG/ACT inhaler Inhale 2 puffs into the lungs  every 6 (six) hours as needed. Shortness of breath. 04/13/14   Tiffany L Reed, DO  calcium carbonate (TUMS - DOSED IN MG ELEMENTAL CALCIUM) 500 MG chewable tablet Chew 1 tablet by mouth daily as needed for indigestion or heartburn.    Historical Provider, MD  hydrocortisone (ANUSOL-HC) 2.5 % rectal cream Place 1 application rectally daily as needed.    Historical Provider, MD  zoster vaccine live, PF, (ZOSTAVAX) 09326 UNT/0.65ML injection Inject 19,400 Units into the skin once. Patient not taking: Reported on 02/26/2015 02/22/15   Kermit Balo, DO    Allergies:   Allergies  Allergen Reactions  . Celebrex [Celecoxib]     unknown  . Chlorzoxazone     unknown    Social History:  Ambulatory   walker   Lives at home alone,         reports that she has quit smoking. Her smoking use included Cigarettes. She quit after 40 years of use. She does not have any smokeless tobacco history on file. She reports that she does not drink alcohol or use illicit drugs.    Family History: family history includes Arthritis in her daughter; Cancer in her sister; Cataracts in her sister; Congestive Heart Failure in her mother; Glaucoma in her sister; Heart disease in her daughter and mother; Macular degeneration in her mother.    Physical Exam: Patient Vitals for the past 24 hrs:  BP Temp Temp src Pulse Resp SpO2  02/27/15 0008 154/69 mmHg - - 69 22 94 %  02/26/15 2136 176/74 mmHg 97.8 F (36.6 C) Oral 79 18 94 %    1. General:  in No Acute distress 2. Psychological: Alert and   Oriented to situation  3. Head/ENT:     Dry Mucous Membranes                          Head Non traumatic, neck supple                          Normal  Dentition 4. SKIN: normal   Skin turgor,  Skin clean Dry and intact no rash 5. Heart: Regular rate and rhythm no Murmur, Rub or gallop 6. Lungs:  no wheezes some crackles   7. Abdomen: Soft, non-tender, Non distended 8. Lower extremities: no clubbing, cyanosis, or edema 9.  Neurologically strength appears to be intact except for left lower extremity that is difficult to assess given pain in the hip from a prior hip fracture patient states that she does not feel any change from prior in the strength of the left leg otherwise cranial nerves II through XII intact 10. MSK: Normal range of motion  body mass index is unknown because there is no weight on file.   Labs on Admission:   Results for orders placed or performed during the hospital encounter of 02/26/15 (from the past 24 hour(s))  Urinalysis, Routine  w reflex microscopic     Status: Abnormal   Collection Time: 02/26/15 10:23 PM  Result Value Ref Range   Color, Urine YELLOW YELLOW   APPearance CLEAR CLEAR   Specific Gravity, Urine <1.005 (L) 1.005 - 1.030   pH 7.0 5.0 - 8.0   Glucose, UA NEGATIVE NEGATIVE mg/dL   Hgb urine dipstick NEGATIVE NEGATIVE   Bilirubin Urine NEGATIVE NEGATIVE   Ketones, ur NEGATIVE NEGATIVE mg/dL   Protein, ur NEGATIVE NEGATIVE mg/dL   Urobilinogen, UA 0.2 0.0 - 1.0 mg/dL   Nitrite NEGATIVE NEGATIVE   Leukocytes, UA NEGATIVE NEGATIVE  CBC     Status: Abnormal   Collection Time: 02/26/15 10:29 PM  Result Value Ref Range   WBC 5.2 4.0 - 10.5 K/uL   RBC 3.71 (L) 3.87 - 5.11 MIL/uL   Hemoglobin 11.6 (L) 12.0 - 15.0 g/dL   HCT 50.5 (L) 39.7 - 67.3 %   MCV 93.5 78.0 - 100.0 fL   MCH 31.3 26.0 - 34.0 pg   MCHC 33.4 30.0 - 36.0 g/dL   RDW 41.9 37.9 - 02.4 %   Platelets 181 150 - 400 K/uL  Comprehensive metabolic panel     Status: Abnormal   Collection Time: 02/26/15 10:29 PM  Result Value Ref Range   Sodium 141 135 - 145 mmol/L   Potassium 4.1 3.5 - 5.1 mmol/L   Chloride 106 96 - 112 mmol/L   CO2 28 19 - 32 mmol/L   Glucose, Bld 101 (H) 70 - 99 mg/dL   BUN 21 6 - 23 mg/dL   Creatinine, Ser 0.97 0.50 - 1.10 mg/dL   Calcium 9.5 8.4 - 35.3 mg/dL   Total Protein 6.3 6.0 - 8.3 g/dL   Albumin 4.4 3.5 - 5.2 g/dL   AST 17 0 - 37 U/L   ALT 13 0 - 35 U/L   Alkaline  Phosphatase 70 39 - 117 U/L   Total Bilirubin 0.5 0.3 - 1.2 mg/dL   GFR calc non Af Amer 74 (L) >90 mL/min   GFR calc Af Amer 85 (L) >90 mL/min   Anion gap 7 5 - 15  I-stat troponin, ED     Status: None   Collection Time: 02/26/15 10:34 PM  Result Value Ref Range   Troponin i, poc 0.01 0.00 - 0.08 ng/mL   Comment 3          I-Stat CG4 Lactic Acid, ED     Status: Abnormal   Collection Time: 02/26/15 10:37 PM  Result Value Ref Range   Lactic Acid, Venous 0.39 (L) 0.5 - 2.0 mmol/L    UA specific gravity <1.005  Lab Results  Component Value Date   HGBA1C 5.8* 02/22/2015    Estimated Creatinine Clearance: 39.3 mL/min (by C-G formula based on Cr of 0.76).  BNP (last 3 results) No results for input(s): PROBNP in the last 8760 hours.  Other results:  I have pearsonaly reviewed this: ECG REPORT  Rate:78  Rhythm:NSR evidence of  LVH ST&T Change: no ischemic changes QTC 472  There were no vitals filed for this visit.   Cultures:    Component Value Date/Time   SDES URINE, CATHETERIZED 01/27/2014 1037   SPECREQUEST NONE 01/27/2014 1037   CULT NO ANAEROBES ISOLATED 03/23/2008 1258   CULT NO GROWTH 3 DAYS 03/23/2008 1258   REPTSTATUS 01/28/2014 FINAL 01/27/2014 1037     Radiological Exams on Admission: Dg Chest 2 View  02/26/2015   CLINICAL DATA:  Weakness.  Dizziness  and headache.  EXAM: CHEST  2 VIEW  COMPARISON:  Chest x-rays dated 06/25/2014 and 03/25/2008  FINDINGS: Heart size is normal. Vascularity is slightly prominent. Tiny bilateral effusions. Minimal atelectasis at the right lung base. No acute osseous abnormality.  IMPRESSION: Tiny bilateral effusions with slight right base atelectasis.   Electronically Signed   By: Francene Boyers M.D.   On: 02/26/2015 23:11   Ct Head Wo Contrast  02/26/2015   CLINICAL DATA:  Weakness and dizziness and headache.  Hypertension.  EXAM: CT HEAD WITHOUT CONTRAST  TECHNIQUE: Contiguous axial images were obtained from the base of the  skull through the vertex without intravenous contrast.  COMPARISON:  CT scan dated 06/25/2014  FINDINGS: No mass lesion. No midline shift. No acute hemorrhage or hematoma. No extra-axial fluid collections. No evidence of acute infarction. There is diffuse slight cerebral cortical atrophy with minimal ventricular dilatation. The patient has extensive chronic periventricular white matter disease. Prominent perivascular space versus old lacunar infarct deep to the right insular cortex.  No osseous abnormality.  IMPRESSION: No acute abnormalities. Chronic small vessel ischemic changes. Atrophy.   Electronically Signed   By: Francene Boyers M.D.   On: 02/26/2015 23:24    Chart has been reviewed  Family not  at  Bedside    Assessment/Plan t his is an 81 year old female with history of hypertension and COPD who presents with episode of lightheadedness after she stood up associated with mild vertigo and episode of severe hypertension  Present on Admission:  . Near syncope - this was in a setting of CVA hypertension. Could've been hypertensive urgency. Currently blood pressure stabilized patient is a symptomatic. Given intermittent vertigo we'll evaluate that MRI of the brain for possible CVA although less likely. Will admit to telemetry to monitor for any arrhythmias. We'll cycle cardiac enzymes. Obtain echogram in the morning. Obtain carotid Dopplers given risk factors such as hypertension and elderly age. Case has been discussed with neurology this point recommends MRI and to reconsult them if there is any evidence of CVA  . Anemia, iron deficiency chronic continue to monitor obtain Hemoccult stool  . Chronic obstructive airway disease with asthma provide albuterol as needed  . Essential hypertension a shin May had an episode of severe hypertension which currently improved will restart home dose of amlodipine and continue to monitor.  . Hypothyroidism continue Synthroid   Prophylaxis: SCD    CODE  STATUS:   DNR/DNI as per patient    Disposition: Observation    To home once workup is complete and patient is stable  Other plan as per orders.  I have spent a total of 65 min on this admission extra time was taken to discuss case with neurology  Bennett Vanscyoc 02/27/2015, 12:15 AM  [facility]  Pager [phone]   after 2 AM please page floor coverage PA If 7AM-7PM, please contact the day team taking care of the patient  [email or web address]  Password [email or web address]

## 2015-02-27 NOTE — Progress Notes (Signed)
Utilization Review completed.  

## 2015-02-27 NOTE — Progress Notes (Signed)
  Echocardiogram 2D Echocardiogram has been performed.  Aris Everts 02/27/2015, 1:46 PM

## 2015-02-28 DIAGNOSIS — I10 Essential (primary) hypertension: Secondary | ICD-10-CM | POA: Diagnosis not present

## 2015-02-28 DIAGNOSIS — R410 Disorientation, unspecified: Secondary | ICD-10-CM | POA: Diagnosis not present

## 2015-02-28 DIAGNOSIS — M810 Age-related osteoporosis without current pathological fracture: Secondary | ICD-10-CM | POA: Diagnosis present

## 2015-02-28 DIAGNOSIS — Z79891 Long term (current) use of opiate analgesic: Secondary | ICD-10-CM | POA: Diagnosis not present

## 2015-02-28 DIAGNOSIS — E785 Hyperlipidemia, unspecified: Secondary | ICD-10-CM | POA: Diagnosis present

## 2015-02-28 DIAGNOSIS — E039 Hypothyroidism, unspecified: Secondary | ICD-10-CM | POA: Diagnosis present

## 2015-02-28 DIAGNOSIS — Z8261 Family history of arthritis: Secondary | ICD-10-CM | POA: Diagnosis not present

## 2015-02-28 DIAGNOSIS — Z87891 Personal history of nicotine dependence: Secondary | ICD-10-CM | POA: Diagnosis not present

## 2015-02-28 DIAGNOSIS — Z66 Do not resuscitate: Secondary | ICD-10-CM | POA: Diagnosis present

## 2015-02-28 DIAGNOSIS — N390 Urinary tract infection, site not specified: Secondary | ICD-10-CM | POA: Diagnosis present

## 2015-02-28 DIAGNOSIS — H9190 Unspecified hearing loss, unspecified ear: Secondary | ICD-10-CM | POA: Diagnosis present

## 2015-02-28 DIAGNOSIS — F329 Major depressive disorder, single episode, unspecified: Secondary | ICD-10-CM | POA: Diagnosis present

## 2015-02-28 DIAGNOSIS — Z7901 Long term (current) use of anticoagulants: Secondary | ICD-10-CM | POA: Diagnosis not present

## 2015-02-28 DIAGNOSIS — Z888 Allergy status to other drugs, medicaments and biological substances status: Secondary | ICD-10-CM | POA: Diagnosis not present

## 2015-02-28 DIAGNOSIS — R9082 White matter disease, unspecified: Secondary | ICD-10-CM | POA: Diagnosis present

## 2015-02-28 DIAGNOSIS — J449 Chronic obstructive pulmonary disease, unspecified: Secondary | ICD-10-CM | POA: Diagnosis not present

## 2015-02-28 DIAGNOSIS — Z83518 Family history of other specified eye disorder: Secondary | ICD-10-CM | POA: Diagnosis not present

## 2015-02-28 DIAGNOSIS — G319 Degenerative disease of nervous system, unspecified: Secondary | ICD-10-CM | POA: Diagnosis present

## 2015-02-28 DIAGNOSIS — F29 Unspecified psychosis not due to a substance or known physiological condition: Secondary | ICD-10-CM | POA: Diagnosis present

## 2015-02-28 DIAGNOSIS — H3530 Unspecified macular degeneration: Secondary | ICD-10-CM | POA: Diagnosis present

## 2015-02-28 DIAGNOSIS — Z79899 Other long term (current) drug therapy: Secondary | ICD-10-CM | POA: Diagnosis not present

## 2015-02-28 DIAGNOSIS — E538 Deficiency of other specified B group vitamins: Secondary | ICD-10-CM | POA: Diagnosis present

## 2015-02-28 DIAGNOSIS — D509 Iron deficiency anemia, unspecified: Secondary | ICD-10-CM | POA: Diagnosis not present

## 2015-02-28 DIAGNOSIS — H269 Unspecified cataract: Secondary | ICD-10-CM | POA: Diagnosis present

## 2015-02-28 DIAGNOSIS — Z86711 Personal history of pulmonary embolism: Secondary | ICD-10-CM | POA: Diagnosis not present

## 2015-02-28 DIAGNOSIS — R42 Dizziness and giddiness: Secondary | ICD-10-CM | POA: Diagnosis not present

## 2015-02-28 DIAGNOSIS — Z801 Family history of malignant neoplasm of trachea, bronchus and lung: Secondary | ICD-10-CM | POA: Diagnosis not present

## 2015-02-28 DIAGNOSIS — M1990 Unspecified osteoarthritis, unspecified site: Secondary | ICD-10-CM | POA: Diagnosis present

## 2015-02-28 DIAGNOSIS — R55 Syncope and collapse: Secondary | ICD-10-CM | POA: Diagnosis not present

## 2015-02-28 DIAGNOSIS — B373 Candidiasis of vulva and vagina: Secondary | ICD-10-CM | POA: Diagnosis present

## 2015-02-28 DIAGNOSIS — Z8249 Family history of ischemic heart disease and other diseases of the circulatory system: Secondary | ICD-10-CM | POA: Diagnosis not present

## 2015-02-28 DIAGNOSIS — H409 Unspecified glaucoma: Secondary | ICD-10-CM | POA: Diagnosis present

## 2015-02-28 DIAGNOSIS — B952 Enterococcus as the cause of diseases classified elsewhere: Secondary | ICD-10-CM | POA: Diagnosis present

## 2015-02-28 DIAGNOSIS — J45909 Unspecified asthma, uncomplicated: Secondary | ICD-10-CM | POA: Diagnosis present

## 2015-02-28 LAB — T4, FREE: FREE T4: 0.54 ng/dL — AB (ref 0.80–1.80)

## 2015-02-28 MED ORDER — LORAZEPAM 0.5 MG PO TABS
0.5000 mg | ORAL_TABLET | Freq: Every day | ORAL | Status: DC
  Administered 2015-02-28: 0.5 mg via ORAL
  Filled 2015-02-28: qty 1

## 2015-02-28 NOTE — Progress Notes (Signed)
UR completed 

## 2015-02-28 NOTE — Progress Notes (Signed)
Progress Note   Jane [name] DTP:[id number] DOB: 09-06-1925 DOA: 02/26/2015 PCP: Bufford Spikes, DO   Brief Narrative:   Jane [name] is an 81 y.o. female with a PMH of hyponatremia, vitamin B 12 deficiency, pulmonary embolism not currently on blood thinners who was admitted 02/26/15 with a presyncopal episode where she felt lightheaded, hot, and had vertigo with positional changes. She was admitted for further observation/obtaining an MRI of her brain after her case was discussed with the neurologist on call, Dr. Amada Jupiter.  Assessment/Plan:   Principal Problem:   Near syncope - MRI of the brain done 02/27/15: Moderate atrophy with advanced white matter disease and remote lacunar infarcts of the basal ganglia noted, but no acute intracranial abnormalities. - Carotid Dopplers pending. - 2-D echocardiogram shows EF of 50-55 percent, grade 1 diastolic dysfunction, no regional wall motion abnormalities. - No arrhythmic events on telemetry. - Troponin negative. - It does not appear that orthostatic vital signs were checked despite being ordered 2.  Active Problems:   Enterococcal UTI /delerium - Continue Unasyn.  Had 2 urine cultures done, 1 grew 50-100K enterococcus.    Vaginal yeast infection - Status post Diflucan 1 02/27/15.    Essential hypertension, benign - Systolic blood pressures improved to the 130s-150s. - Currently being managed with Norvasc 5 mg daily.     Anemia, iron deficiency  - Hemoglobin stable.    Hypothyroidism - TSH markedly elevated. Check free T4  - Continue Synthroid, dosage recently changed from 75 ---> 88 mcg per daughter's report.    Chronic obstructive airway disease with asthma - Continue albuterol as needed.    Hyperlipidemia - Continue statin.   Depression / psychosis - On Seroquel and Zoloft. Ativan currently on hold.    DVT Prophylaxis - SCDs.  Code Status: DNR Family Communication: Daughter, Jane [name] at bedside. Disposition Plan:  Home when work up completed, likely 1-2 days.  From [facility] (ALF).   IV Access:    Peripheral IV   Procedures and diagnostic studies:   Dg Chest 2 View 02/26/2015: Tiny bilateral effusions with slight right base atelectasis.    Ct Head Wo Contrast 02/26/2015: No acute abnormalities. Chronic small vessel ischemic changes. Atrophy.     Medical Consultants:    None.  Anti-Infectives:    Unasyn 02/27/15--->  Subjective:   Jane [name] is more lethargic today, has been asleep all morning, per daughter, who is at the bedside.  Still having auditory hallucinations of a man singing.  No shortness of breath, still having right lower quadrant abdominal pain.  Says she hasn't slept all night.  Objective:    Filed Vitals:   02/27/15 0815 02/27/15 1418 02/27/15 2152 02/28/15 0544  BP: 149/90 149/75 138/72 151/72  Pulse: 88 78 78 78  Temp:  97.8 F (36.6 C) 98 F (36.7 C) 97.4 F (36.3 C)  TempSrc:  Oral Oral Oral  Resp:  22 21 19   Height:      Weight:      SpO2: 94% 91% 92% 92%    Intake/Output Summary (Last 24 hours) at 02/28/15 03/02/15 Last data filed at 02/28/15 0600  Gross per 24 hour  Intake   1120 ml  Output   1350 ml  Net   -230 ml    Exam: Gen:  NAD, awakens to voice Cardiovascular:  RRR, No M/R/G Respiratory:  Lungs CTAB Gastrointestinal:  Abdomen soft, tender right lower quadrant, + BS Extremities:  No C/E/C   Data Reviewed:  Labs: Basic Metabolic Panel:  Recent Labs Lab 02/22/15 1420 02/26/15 2229 02/27/15 0145  NA 139 141 138  K 4.4 4.1 3.9  CL 99 106 107  CO2 26 28 27   GLUCOSE 114* 101* 92  BUN 18 21 19   CREATININE 0.75 0.76 0.69  CALCIUM 9.6 9.5 9.0  MG  --   --  2.0  PHOS  --   --  3.7   GFR Estimated Creatinine Clearance: 38.2 mL/min (by C-G formula based on Cr of 0.69). Liver Function Tests:  Recent Labs Lab 02/22/15 1420 02/26/15 2229 02/27/15 0145  AST 17 17 17   ALT 10 13 13   ALKPHOS 70 70 62  BILITOT 0.4 0.5 0.6    PROT 6.3 6.3 6.1  ALBUMIN  --  4.4 4.1   CBC:  Recent Labs Lab 02/22/15 1420 02/26/15 2229 02/27/15 0145  WBC 6.5 5.2 5.0  NEUTROABS 4.9  --   --   HGB 12.0 11.6* 11.9*  HCT 35.8 34.7* 35.6*  MCV 92 93.5 94.4  PLT 207 181 182   Cardiac Enzymes:  Recent Labs Lab 02/27/15 0145 02/27/15 0733 02/27/15 1350  TROPONINI <0.03 <0.03 <0.03   Thyroid function studies:  Recent Labs  02/27/15 0145  TSH 19.354*   Sepsis Labs:  Recent Labs Lab 02/22/15 1420 02/26/15 2229 02/26/15 2237 02/27/15 0145  WBC 6.5 5.2  --  5.0  LATICACIDVEN  --   --  0.39*  --    Microbiology Recent Results (from the past 240 hour(s))  Urine culture     Status: Abnormal   Collection Time: 02/22/15  2:21 PM  Result Value Ref Range Status   Urine Culture, Routine Final report (A)  Final   Result 1 Enterococcus species (A)  Final    Comment: 50,000-100,000 colony forming units per mL Note: this isolate is vancomycin-susceptible. This information is provided for epidemiologic purposes only: vancomycin is not among the antibiotics recommended for therapy of urinary tract infections caused by Enterococcus.    RESULT 2 Comment  Final    Comment: Mixed urogenital flora Less than 10,000 colonies/mL    ANTIMICROBIAL SUSCEPTIBILITY Comment  Final    Comment:       ** S = Susceptible; I = Intermediate; R = Resistant **                    P = Positive; N = Negative             MICS are expressed in micrograms per mL    Antibiotic                 RSLT#1    RSLT#2    RSLT#3    RSLT#4 Ciprofloxacin                  S Levofloxacin                   S Nitrofurantoin                 S Penicillin                     S Tetracycline                   R Vancomycin                     S   MRSA PCR Screening     Status: None  Collection Time: 02/27/15  1:47 AM  Result Value Ref Range Status   MRSA by PCR NEGATIVE NEGATIVE Final    Comment:        The GeneXpert MRSA Assay (FDA approved for NASAL  specimens only), is one component of a comprehensive MRSA colonization surveillance program. It is not intended to diagnose MRSA infection nor to guide or monitor treatment for MRSA infections.      Medications:   . amLODipine  5 mg Oral Daily  . ampicillin-sulbactam (UNASYN) IV  1.5 g Intravenous Q6H  . aspirin EC  81 mg Oral Daily  . dicyclomine  10 mg Oral QHS  . fluconazole  150 mg Oral Daily  . latanoprost  1 drop Both Eyes QHS  . levothyroxine  88 mcg Oral QAC breakfast  . loratadine  10 mg Oral Daily  . mirabegron ER  50 mg Oral Daily  . polyethylene glycol  17 g Oral Daily  . pravastatin  20 mg Oral q1800  . QUEtiapine  50 mg Oral Daily  . sertraline  100 mg Oral Daily  . sodium chloride  3 mL Intravenous Q12H   Continuous Infusions:    Time spent: 25 minutes.     RAMA,CHRISTINA  [facility] Pager [phone]. If unable to reach me by pager, please call my cell phone at [phone].  *Please refer to [email or web address], password [email or web address] to get updated schedule on who will round on this patient, as hospitalists switch teams weekly. If 7PM-7AM, please contact night-coverage at [email or web address], password [email or web address] for any overnight needs.  02/28/2015, 8:24 AM

## 2015-02-28 NOTE — Progress Notes (Signed)
*  PRELIMINARY RESULTS* Vascular Ultrasound Carotid Duplex (Doppler) has been completed.   Findings suggest 1-39% internal carotid artery stenosis bilaterally. Vertebral arteries are patent with antegrade flow.  02/28/2015 11:17 AM Gertie Fey, RVT, RDCS, RDMS

## 2015-03-01 DIAGNOSIS — D509 Iron deficiency anemia, unspecified: Secondary | ICD-10-CM

## 2015-03-01 DIAGNOSIS — F329 Major depressive disorder, single episode, unspecified: Secondary | ICD-10-CM

## 2015-03-01 LAB — URINE CULTURE
Colony Count: NO GROWTH
Culture: NO GROWTH
Special Requests: NORMAL

## 2015-03-01 LAB — HEMOGLOBIN A1C
HEMOGLOBIN A1C: 5.5 % (ref 4.8–5.6)
Mean Plasma Glucose: 111 mg/dL

## 2015-03-01 MED ORDER — ASPIRIN 81 MG PO TBEC: 81.0000 mg | DELAYED_RELEASE_TABLET | Freq: Every day | ORAL | Status: DC

## 2015-03-01 MED ORDER — POLYETHYLENE GLYCOL 3350 17 G PO PACK: 17.0000 g | PACK | Freq: Every day | ORAL | Status: DC

## 2015-03-01 MED ORDER — AMPICILLIN 250 MG PO CAPS: 250.0000 mg | ORAL_CAPSULE | Freq: Four times a day (QID) | ORAL | Status: DC

## 2015-03-01 NOTE — Discharge Instructions (Signed)
Dizziness °Dizziness is a common problem. It is a feeling of unsteadiness or light-headedness. You may feel like you are about to faint. Dizziness can lead to injury if you stumble or fall. A person of any age group can suffer from dizziness, but dizziness is more common in older adults. °CAUSES  °Dizziness can be caused by many different things, including: °· Middle ear problems. °· Standing for too long. °· Infections. °· An allergic reaction. °· Aging. °· An emotional response to something, such as the sight of blood. °· Side effects of medicines. °· Tiredness. °· Problems with circulation or blood pressure. °· Excessive use of alcohol or medicines, or illegal drug use. °· Breathing too fast (hyperventilation). °· An irregular heart rhythm (arrhythmia). °· A low red blood cell count (anemia). °· Pregnancy. °· Vomiting, diarrhea, fever, or other illnesses that cause body fluid loss (dehydration). °· Diseases or conditions such as Parkinson's disease, high blood pressure (hypertension), diabetes, and thyroid problems. °· Exposure to extreme heat. °DIAGNOSIS  °Your health care provider will ask about your symptoms, perform a physical exam, and perform an electrocardiogram (ECG) to record the electrical activity of your heart. Your health care provider may also perform other heart or blood tests to determine the cause of your dizziness. These may include: °· Transthoracic echocardiogram (TTE). During echocardiography, sound waves are used to evaluate how blood flows through your heart. °· Transesophageal echocardiogram (TEE). °· Cardiac monitoring. This allows your health care provider to monitor your heart rate and rhythm in real time. °· Holter monitor. This is a portable device that records your heartbeat and can help diagnose heart arrhythmias. It allows your health care provider to track your heart activity for several days if needed. °· Stress tests by exercise or by giving medicine that makes the heart beat  faster. °TREATMENT  °Treatment of dizziness depends on the cause of your symptoms and can vary greatly. °HOME CARE INSTRUCTIONS  °· Drink enough fluids to keep your urine clear or pale yellow. This is especially important in very hot weather. In older adults, it is also important in cold weather. °· Take your medicine exactly as directed if your dizziness is caused by medicines. When taking blood pressure medicines, it is especially important to get up slowly. °· Rise slowly from chairs and steady yourself until you feel okay. °· In the morning, first sit up on the side of the bed. When you feel okay, stand slowly while holding onto something until you know your balance is fine. °· Move your legs often if you need to stand in one place for a long time. Tighten and relax your muscles in your legs while standing. °· Have someone stay with you for 1-2 days if dizziness continues to be a problem. Do this until you feel you are well enough to stay alone. Have the person call your health care provider if he or she notices changes in you that are concerning. °· Do not drive or use heavy machinery if you feel dizzy. °· Do not drink alcohol. °SEEK IMMEDIATE MEDICAL CARE IF:  °· Your dizziness or light-headedness gets worse. °· You feel nauseous or vomit. °· You have problems talking, walking, or using your arms, hands, or legs. °· You feel weak. °· You are not thinking clearly or you have trouble forming sentences. It may take a friend or family member to notice this. °· You have chest pain, abdominal pain, shortness of breath, or sweating. °· Your vision changes. °· You notice   any bleeding. °· You have side effects from medicine that seems to be getting worse rather than better. °MAKE SURE YOU:  °· Understand these instructions. °· Will watch your condition. °· Will get help right away if you are not doing well or get worse. °Document Released: 04/18/2001 Document Revised: 10/28/2013 Document Reviewed: 05/12/2011 °ExitCare®  Patient Information ©2015 [employer]. This information is not intended to replace advice given to you by your health care provider. Make sure you discuss any questions you have with your health care provider. ° ° ° °Vertigo °Vertigo means you feel like you or your surroundings are moving when they are not. Vertigo can be dangerous if it occurs when you are at work, driving, or performing difficult activities.  °CAUSES  °Vertigo occurs when there is a conflict of signals sent to your brain from the visual and sensory systems in your body. There are many different causes of vertigo, including: °· Infections, especially in the inner ear. °· A bad reaction to a drug or misuse of alcohol and medicines. °· Withdrawal from drugs or alcohol. °· Rapidly changing positions, such as lying down or rolling over in bed. °· A migraine headache. °· Decreased blood flow to the brain. °· Increased pressure in the brain from a head injury, infection, tumor, or bleeding. °SYMPTOMS  °You may feel as though the world is spinning around or you are falling to the ground. Because your balance is upset, vertigo can cause nausea and vomiting. You may have involuntary eye movements (nystagmus). °DIAGNOSIS  °Vertigo is usually diagnosed by physical exam. If the cause of your vertigo is unknown, your caregiver may perform imaging tests, such as an MRI scan (magnetic resonance imaging). °TREATMENT  °Most cases of vertigo resolve on their own, without treatment. Depending on the cause, your caregiver may prescribe certain medicines. If your vertigo is related to body position issues, your caregiver may recommend movements or procedures to correct the problem. In rare cases, if your vertigo is caused by certain inner ear problems, you may need surgery. °HOME CARE INSTRUCTIONS  °· Follow your caregiver's instructions. °· Avoid driving. °· Avoid operating heavy machinery. °· Avoid performing any tasks that would be dangerous to you or others  during a vertigo episode. °· Tell your caregiver if you notice that certain medicines seem to be causing your vertigo. Some of the medicines used to treat vertigo episodes can actually make them worse in some people. °SEEK IMMEDIATE MEDICAL CARE IF:  °· Your medicines do not relieve your vertigo or are making it worse. °· You develop problems with talking, walking, weakness, or using your arms, hands, or legs. °· You develop severe headaches. °· Your nausea or vomiting continues or gets worse. °· You develop visual changes. °· A family member notices behavioral changes. °· Your condition gets worse. °MAKE SURE YOU: °· Understand these instructions. °· Will watch your condition. °· Will get help right away if you are not doing well or get worse. °Document Released: 08/02/2005 Document Revised: 01/15/2012 Document Reviewed: 05/11/2011 °ExitCare® Patient Information ©2015 [employer]. This information is not intended to replace advice given to you by your health care provider. Make sure you discuss any questions you have with your health care provider. ° °

## 2015-03-01 NOTE — Evaluation (Signed)
Physical Therapy Evaluation Patient Details Name: Jane [name] MRN: [id number] DOB: [birth year] Today's Date: 03/01/2015   History of Present Illness  81 y.o. female with a PMH of hyponatremia, vitamin B 12 deficiency, pulmonary embolism not currently on blood thinners, bil hip fractures (per pt) who was admitted 02/26/15 with near syncope and c/o lightheadedness  Clinical Impression  Pt admitted with above diagnosis. Pt currently with functional limitations due to the deficits listed below (see PT Problem List).  Pt will benefit from skilled PT to increase their independence and safety with mobility to allow discharge to the venue listed below.   Pt reports hx of falls, most due to dizziness per pt, and lives at ALF.  Pt states she has had HHPT before after her previous hip fractures this past year.     Follow Up Recommendations Home health PT    Equipment Recommendations  None recommended by PT    Recommendations for Other Services       Precautions / Restrictions Precautions Precautions: Fall Restrictions Weight Bearing Restrictions: No      Mobility  Bed Mobility Overal bed mobility: Modified Independent                Transfers Overall transfer level: Needs assistance Equipment used: Rolling walker (2 wheeled) Transfers: Sit to/from Stand Sit to Stand: Min guard;Min assist         General transfer comment: min/guard for safety, min assist from lower toilet  Ambulation/Gait Ambulation/Gait assistance: Min guard Ambulation Distance (Feet): 200 Feet Assistive device: Rolling walker (2 wheeled) Gait Pattern/deviations: Step-through pattern;Decreased stride length;Narrow base of support     General Gait Details: very short narrow steps likely due to hx of bil hip fxs, pt denies dizziness  Stairs            Wheelchair Mobility    Modified Rankin (Stroke Patients Only)       Balance Overall balance assessment: History of Falls                                            Pertinent Vitals/Pain Pain Assessment: No/denies pain    Home Living Family/patient expects to be discharged to:: Assisted living               Home Equipment: Walker - 2 wheels;Shower seat      Prior Function Level of Independence: Independent with assistive device(s)         Comments: pt reports she has assist available at ALF for ADLs and housekeeping however she likes to stay independent as possible     Hand Dominance        Extremity/Trunk Assessment               Lower Extremity Assessment: Generalized weakness         Communication   Communication: HOH (reports being legally blind)  Cognition Arousal/Alertness: Awake/alert Behavior During Therapy: WFL for tasks assessed/performed Overall Cognitive Status: Within Functional Limits for tasks assessed                      General Comments      Exercises        Assessment/Plan    PT Assessment Patient needs continued PT services  PT Diagnosis Difficulty walking   PT Problem List Decreased strength;Decreased balance;Decreased mobility  PT Treatment Interventions DME instruction;Gait training;Balance training;Functional  mobility training;Therapeutic activities;Therapeutic exercise;Patient/family education   PT Goals (Current goals can be found in the Care Plan section) Acute Rehab PT Goals PT Goal Formulation: With patient Time For Goal Achievement: 03/08/15 Potential to Achieve Goals: Good    Frequency Min 3X/week   Barriers to discharge        Co-evaluation               End of Session   Activity Tolerance: Patient tolerated treatment well Patient left: in chair;with call bell/phone within reach;with chair alarm set Nurse Communication: Mobility status         Time: 5573-2202 PT Time Calculation (min) (ACUTE ONLY): 26 min   Charges:   PT Evaluation $Initial PT Evaluation Tier I: 1 Procedure     PT G Codes:         Reianna Batdorf,KATHrine E 03/01/2015, 10:51 AM Zenovia Jarred, PT, DPT 03/01/2015 Pager: [phone]

## 2015-03-01 NOTE — Progress Notes (Signed)
Pt states, "that she is from [facility] and that they have HHPT, NA, RN there."  Referral given to [place], CSW.

## 2015-03-01 NOTE — Discharge Summary (Signed)
Physician Discharge Summary  Jane [name] IHK:[id number] DOB: Apr 14, 1925 DOA: 02/26/2015  PCP: Bufford Spikes, DO  Admit date: 02/26/2015 Discharge date: 03/01/2015   Recommendations for Outpatient Follow-Up:   1. F/U PCP in 1-2 weeks to ensure resolution of UTI, auditory hallucinations, vertigo symptoms. 2. Repeat TSH/free T4 in 4 weeks to ensure appropriately replaced.   Discharge Diagnosis:   Principal Problem:    Near syncope Active Problems:    Depression    Essential hypertension, benign    Anemia, iron deficiency    Hypothyroidism    Psychosis    Chronic obstructive airway disease with asthma    Hyperlipidemia    Acute delirium    Vaginal yeast infection    Enterococcus UTI   Discharge disposition:  [facility].  Discharge Condition: Improved.  Diet recommendation: Low sodium, heart healthy.   Wound care: None.   History of Present Illness:   Jane [name] is an 81 y.o. female with a PMH of hyponatremia, vitamin B 12 deficiency, pulmonary embolism not currently on blood thinners who was admitted 02/26/15 with a presyncopal episode where she felt lightheaded, hot, and had vertigo with positional changes. She was admitted for further observation/obtaining an MRI of her brain after her case was discussed with the neurologist on call, Dr. Amada Jupiter.  Hospital Course by Problem:   Principal Problem:  Near syncope - MRI of the brain done 02/27/15: Moderate atrophy with advanced white matter disease and remote lacunar infarcts of the basal ganglia noted, but no acute intracranial abnormalities.  Continue ASA. - Carotid Dopplers showed 1-39 percent bilateral stenosis, no indication for therapy other than lipid, BP control/ASA. - 2-D echocardiogram shows EF of 50-55 percent, grade 1 diastolic dysfunction, no regional wall motion abnormalities. - No arrhythmic events on telemetry. - Troponin negative. - Orthostatics negative.  Active Problems:   Enterococcal UTI /delerium - Had 2 urine cultures done, 1 grew 50-100K enterococcus. Treated with Unasyn while in the hospital. - D/C home on 5 more days of Ampicillin.   Vaginal yeast infection - Status post Diflucan 1 02/27/15.   Essential hypertension, benign - Systolic blood pressures have ranged from 130-161. - Currently being managed with Norvasc 5 mg daily.    Anemia, iron deficiency  - Hemoglobin stable.   Hypothyroidism - TSH markedly elevated. Free T4 low at 0.54. - Continue Synthroid, dosage recently changed from 75 ---> 88 mcg per daughter's report.   Chronic obstructive airway disease with asthma - Continue albuterol as needed.   Hyperlipidemia - Continue statin.  Depression / psychosis - On Seroquel, Ativan and Zoloft.     Medical Consultants:    None.   Discharge Exam:   Filed Vitals:   03/01/15 0449  BP: 161/78  Pulse: 98  Temp: 98.3 F (36.8 C)  Resp: 18   Filed Vitals:   02/28/15 1429 02/28/15 1431 02/28/15 2024 03/01/15 0449  BP: 143/65 151/73 130/59 161/78  Pulse: 81 82 71 98  Temp:   97.9 F (36.6 C) 98.3 F (36.8 C)  TempSrc:   Oral Oral  Resp:   17 18  Height:      Weight:      SpO2: 91% 91% 92% 94%    Gen:  NAD Cardiovascular:  RRR, No M/R/G Respiratory: Lungs CTAB Gastrointestinal: Abdomen soft, NT/ND with normal active bowel sounds. Extremities: No C/E/C   The results of significant diagnostics from this hospitalization (including imaging, microbiology, ancillary and laboratory) are listed below for reference.  Procedures and Diagnostic Studies:   Dg Chest 2 View 02/26/2015: Tiny bilateral effusions with slight right base atelectasis.   Ct Head Wo Contrast 02/26/2015: No acute abnormalities. Chronic small vessel ischemic changes. Atrophy.   Labs:   Basic Metabolic Panel:  Recent Labs Lab 02/22/15 1420 02/26/15 2229 02/27/15 0145  NA 139 141 138  K 4.4 4.1 3.9  CL 99 106 107  CO2 26 28 27     GLUCOSE 114* 101* 92  BUN 18 21 19   CREATININE 0.75 0.76 0.69  CALCIUM 9.6 9.5 9.0  MG  --   --  2.0  PHOS  --   --  3.7   GFR Estimated Creatinine Clearance: 38.2 mL/min (by C-G formula based on Cr of 0.69). Liver Function Tests:  Recent Labs Lab 02/22/15 1420 02/26/15 2229 02/27/15 0145  AST 17 17 17   ALT 10 13 13   ALKPHOS 70 70 62  BILITOT 0.4 0.5 0.6  PROT 6.3 6.3 6.1  ALBUMIN  --  4.4 4.1   CBC:  Recent Labs Lab 02/22/15 1420 02/26/15 2229 02/27/15 0145  WBC 6.5 5.2 5.0  NEUTROABS 4.9  --   --   HGB 12.0 11.6* 11.9*  HCT 35.8 34.7* 35.6*  MCV 92 93.5 94.4  PLT 207 181 182   Cardiac Enzymes:  Recent Labs Lab 02/27/15 0145 02/27/15 0733 02/27/15 1350  TROPONINI <0.03 <0.03 <0.03   Thyroid function studies  Recent Labs  02/27/15 0145  TSH 19.354*   Microbiology Recent Results (from the past 240 hour(s))  Urine culture     Status: Abnormal   Collection Time: 02/22/15  2:21 PM  Result Value Ref Range Status   Urine Culture, Routine Final report (A)  Final   Result 1 Enterococcus species (A)  Final    Comment: 50,000-100,000 colony forming units per mL Note: this isolate is vancomycin-susceptible. This information is provided for epidemiologic purposes only: vancomycin is not among the antibiotics recommended for therapy of urinary tract infections caused by Enterococcus.    RESULT 2 Comment  Final    Comment: Mixed urogenital flora Less than 10,000 colonies/mL    ANTIMICROBIAL SUSCEPTIBILITY Comment  Final    Comment:       ** S = Susceptible; I = Intermediate; R = Resistant **                    P = Positive; N = Negative             MICS are expressed in micrograms per mL    Antibiotic                 RSLT#1    RSLT#2    RSLT#3    RSLT#4 Ciprofloxacin                  S Levofloxacin                   S Nitrofurantoin                 S Penicillin                     S Tetracycline                   R Vancomycin                     S    MRSA PCR Screening  Status: None   Collection Time: 02/27/15  1:47 AM  Result Value Ref Range Status   MRSA by PCR NEGATIVE NEGATIVE Final    Comment:        The GeneXpert MRSA Assay (FDA approved for NASAL specimens only), is one component of a comprehensive MRSA colonization surveillance program. It is not intended to diagnose MRSA infection nor to guide or monitor treatment for MRSA infections.      Discharge Instructions:   Discharge Instructions    Call MD for:  extreme fatigue    Complete by:  As directed      Call MD for:  persistant dizziness or light-headedness    Complete by:  As directed      Call MD for:  persistant nausea and vomiting    Complete by:  As directed      Call MD for:  temperature >100.4    Complete by:  As directed      Diet - low sodium heart healthy    Complete by:  As directed      Discharge instructions    Complete by:  As directed   You were cared for by Dr. Hillery Aldo  (a hospitalist) during your hospital stay. If you have any questions about your discharge medications or the care you received while you were in the hospital after you are discharged, you can call the unit and ask to speak with the hospitalist on call if the hospitalist that took care of you is not available. Once you are discharged, your primary care physician will handle any further medical issues. Please note that NO REFILLS for any discharge medications will be authorized once you are discharged, as it is imperative that you return to your primary care physician (or establish a relationship with a primary care physician if you do not have one) for your aftercare needs so that they can reassess your need for medications and monitor your lab values.  Any outstanding tests can be reviewed by your PCP at your follow up visit.  It is also important to review any medicine changes with your PCP.  Please bring these d/c instructions with you to your next visit so your physician can  review these changes with you.     Increase activity slowly    Complete by:  As directed             Medication List    STOP taking these medications        zoster vaccine live (PF) 19400 UNT/0.65ML injection  Commonly known as:  ZOSTAVAX      TAKE these medications        albuterol 108 (90 BASE) MCG/ACT inhaler  Commonly known as:  PROVENTIL HFA;VENTOLIN HFA  Inhale 2 puffs into the lungs every 6 (six) hours as needed. Shortness of breath.     amLODipine 5 MG tablet  Commonly known as:  NORVASC  Take 1 tablet (5 mg total) by mouth daily.     ampicillin 250 MG capsule  Commonly known as:  PRINCIPEN  Take 1 capsule (250 mg total) by mouth 4 (four) times daily.     aspirin 81 MG EC tablet  Take 1 tablet (81 mg total) by mouth daily.     calcium carbonate 500 MG chewable tablet  Commonly known as:  TUMS - dosed in mg elemental calcium  Chew 1 tablet by mouth daily as needed for indigestion or heartburn.     calcium-vitamin D 500-200 MG-UNIT  per tablet  Commonly known as:  OSCAL WITH D  Take 1 tablet by mouth daily.     dicyclomine 10 MG capsule  Commonly known as:  BENTYL  Take 10 mg by mouth at bedtime.     fexofenadine 180 MG tablet  Commonly known as:  ALLEGRA  Take 180 mg by mouth daily as needed for allergies or rhinitis.     HYDROcodone-acetaminophen 5-325 MG per tablet  Commonly known as:  NORCO/VICODIN  Take one tablet by mouth three times daily with meals and One tablet at bedtime for pain     hydrocortisone 2.5 % rectal cream  Commonly known as:  ANUSOL-HC  Place 1 application rectally daily as needed.     ICAPS LUTEIN & OMEGA-3 Caps  Take 1 capsule by mouth daily.     latanoprost 0.005 % ophthalmic solution  Commonly known as:  XALATAN  Place 1 drop into both eyes at bedtime.     levothyroxine 88 MCG tablet  Commonly known as:  SYNTHROID, LEVOTHROID  Take one tablet by mouth 30 minutes before breakfast for thyroid     LORazepam 0.5 MG tablet   Commonly known as:  ATIVAN  TAKE TWO TABLETS BY MOUTH AT BEDTIME TO HELP REST     lovastatin 20 MG tablet  Commonly known as:  MEVACOR  Take 1 tablet (20 mg total) by mouth every evening.     mirabegron ER 50 MG Tb24 tablet  Commonly known as:  MYRBETRIQ  Take one tablet by mouth once daily for bladder     polyethylene glycol packet  Commonly known as:  MIRALAX / GLYCOLAX  Take 17 g by mouth daily.     QUEtiapine 50 MG tablet  Commonly known as:  SEROQUEL  Take 1 tablet (50 mg total) by mouth daily.     sertraline 100 MG tablet  Commonly known as:  ZOLOFT  Take one tablet by mouth once daily     vitamin B-12 100 MCG tablet  Commonly known as:  CYANOCOBALAMIN  Take 1 tablet (100 mcg total) by mouth daily.           Follow-up Information    Follow up with REED, TIFFANY, DO.   Specialty:  Geriatric Medicine   Contact information:   [address] [place] [address] [phone]        Time coordinating discharge: 35 minutes.  Signed:  Lorraine Cimmino  Pager [phone] [facility] 03/01/2015, 10:03 AM

## 2015-03-01 NOTE — Progress Notes (Signed)
Completed D/C teaching with family and called report to [facility]. Spoke with Adela Lank and answered all questions. Pt being D/C back to [facility] via daughter.

## 2015-03-01 NOTE — Progress Notes (Signed)
Patient is set to discharge back to [facility] today. Patient & daughter, [name] at bedside aware. CSW confirmed with Nani Gasser at ALF that they are able to take patient back. Discharge packet given to RN, Catie. Daughter to transport back to ALF.     Lincoln Maxin, LCSW [facility] Clinical Social Worker cell #: [phone]

## 2015-03-03 ENCOUNTER — Encounter: Payer: Self-pay | Admitting: Internal Medicine

## 2015-03-03 ENCOUNTER — Ambulatory Visit (INDEPENDENT_AMBULATORY_CARE_PROVIDER_SITE_OTHER): Payer: Medicare Other | Admitting: Internal Medicine

## 2015-03-03 VITALS — BP 158/78 | HR 92 | Temp 98.1°F | Resp 20 | Ht 62.0 in | Wt 108.8 lb

## 2015-03-03 DIAGNOSIS — S6981XA Other specified injuries of right wrist, hand and finger(s), initial encounter: Secondary | ICD-10-CM | POA: Diagnosis not present

## 2015-03-03 DIAGNOSIS — S20212A Contusion of left front wall of thorax, initial encounter: Secondary | ICD-10-CM | POA: Diagnosis not present

## 2015-03-03 DIAGNOSIS — S0083XA Contusion of other part of head, initial encounter: Secondary | ICD-10-CM | POA: Diagnosis not present

## 2015-03-03 DIAGNOSIS — S6991XA Unspecified injury of right wrist, hand and finger(s), initial encounter: Secondary | ICD-10-CM

## 2015-03-03 NOTE — Patient Instructions (Addendum)
Turn slowly and get up slowly to reduce dizziness.  May apply cool compress as needed to bruised areas.  Continue neosporin to fingernail daily. Change dressing daily. You may loose the entire nail.  Keep appointment with Dr Renato Gails as scheduled

## 2015-03-03 NOTE — Progress Notes (Signed)
Patient ID: Jane [name], female   DOB: 1925/08/19, 81 y.o.   MRN: [id number]    Facility  [facility]    Place of Service:   OFFICE    Allergies  Allergen Reactions  . Celebrex [Celecoxib]     unknown  . Chlorzoxazone     unknown    Chief Complaint  Patient presents with  . Acute Visit     c/o patient had a fall last night ribs sore on left side ,cut on left eye,ripped off nail on right hand    HPI:  81 yo female seen today for an acute visit.  Last evening, she fell while walking at [facility] She tripped over her feet.  She bruised left rib and right 3rd fingernail, and left eye bruise from eyeglasses. She feels tired and her left ribs and right fingernail hurt. Pain increases with deep breathing. Took hydrocodone  Last night which helped her to sleep. No ice/heat applied. Fall unwitnessed. No LOC. Auditory hallucinations improved on seroquel but still hears music a little. She has a hospital f/u with Dr Mariea Clonts on May 9th.  Past Medical History  Diagnosis Date  . Hypertension   . Impacted cerumen   . Abnormality of gait   . Anal fissure   . Loss of weight   . Hyposmolality and/or hyponatremia   . Pain in limb   . Other vitamin B12 deficiency anemia   . Chest pain, unspecified   . Anemia, unspecified   . Anxiety state, unspecified   . Abdominal pain, right upper quadrant   . Other pulmonary embolism and infarction   . Intestinal or peritoneal adhesions with obstruction (postoperative) (postinfection)   . Long term (current) use of anticoagulants   . Abdominal pain, right lower quadrant   . Essential and other specified forms of tremor   . Lumbago   . Unspecified hypothyroidism   . Major depressive disorder, single episode, unspecified   . Other and unspecified hyperlipidemia   . Macular degeneration (senile) of retina, unspecified   . Unspecified glaucoma   . Unspecified cataract   . Unspecified hearing loss   . Unspecified essential hypertension   . Osteoarthrosis,  unspecified whether generalized or localized, unspecified site   . Osteoporosis, unspecified   . Abnormal involuntary movements(781.0)   . Emphysema of lung   . CAP (community acquired pneumonia) 01/31/2014  . Postoperative anemia due to acute blood loss 01/31/2014  . Laceration of left ear canal 04/13/2014  . Closed left hip fracture 06/25/2014  . Gastritis 02/13/2013  . Hyperlipidemia    Past Surgical History  Procedure Laterality Date  . Excisional hemorrhoidectomy  1980  . Bladder suspension  1993  . Back surgery  2003  . Abdominal hysterectomy    . Trigger finger release  2009    Dr Daylene Katayama  . Intramedullary (im) nail intertrochanteric Right 01/27/2014    Procedure: INTRAMEDULLARY (IM) NAIL INTERTROCHANTRIC;  Surgeon: Renette Butters, MD;  Location: [place];  Service: Orthopedics;  Laterality: Right;  . Intramedullary (im) nail intertrochanteric Left 06/26/2014    Procedure: LEFT HIP INTRAMEDULLARY (IM) NAIL;  Surgeon: Marianna Payment, MD;  Location: [place];  Service: Orthopedics;  Laterality: Left;   History   Social History  . Marital Status: Married    Spouse Name: N/A  . Number of Children: N/A  . Years of Education: N/A   Social History Main Topics  . Smoking status: Former Smoker -- 40 years    Types: Cigarettes  .  Smokeless tobacco: Not on file  . Alcohol Use: No  . Drug Use: No  . Sexual Activity: Not on file   Other Topics Concern  . None   Social History Narrative     Medications: Patient's Medications  New Prescriptions   No medications on file  Previous Medications   ALBUTEROL (PROVENTIL HFA;VENTOLIN HFA) 108 (90 BASE) MCG/ACT INHALER    Inhale 2 puffs into the lungs every 6 (six) hours as needed. Shortness of breath.   AMLODIPINE (NORVASC) 5 MG TABLET    Take 1 tablet (5 mg total) by mouth daily.   AMPICILLIN (PRINCIPEN) 250 MG CAPSULE    Take 1 capsule (250 mg total) by mouth 4 (four) times daily.   CALCIUM CARBONATE (TUMS - DOSED IN MG ELEMENTAL  CALCIUM) 500 MG CHEWABLE TABLET    Chew 1 tablet by mouth daily as needed for indigestion or heartburn.   CALCIUM-VITAMIN D (OSCAL WITH D) 500-200 MG-UNIT PER TABLET    Take 1 tablet by mouth daily.   DICYCLOMINE (BENTYL) 10 MG CAPSULE    Take 10 mg by mouth at bedtime.   FEXOFENADINE (ALLEGRA) 180 MG TABLET    Take 180 mg by mouth daily as needed for allergies or rhinitis.   HYDROCODONE-ACETAMINOPHEN (NORCO/VICODIN) 5-325 MG PER TABLET    Take one tablet by mouth three times daily with meals and One tablet at bedtime for pain   HYDROCORTISONE (ANUSOL-HC) 2.5 % RECTAL CREAM    Place 1 application rectally daily as needed.   LATANOPROST (XALATAN) 0.005 % OPHTHALMIC SOLUTION    Place 1 drop into both eyes at bedtime.   LEVOTHYROXINE (SYNTHROID, LEVOTHROID) 88 MCG TABLET    Take one tablet by mouth 30 minutes before breakfast for thyroid   LORAZEPAM (ATIVAN) 0.5 MG TABLET    TAKE TWO TABLETS BY MOUTH AT BEDTIME TO HELP REST   LOVASTATIN (MEVACOR) 20 MG TABLET    Take 1 tablet (20 mg total) by mouth every evening.   MIRABEGRON ER (MYRBETRIQ) 50 MG TB24 TABLET    Take one tablet by mouth once daily for bladder   MULTIPLE VITAMINS-MINERALS (ICAPS LUTEIN & OMEGA-3) CAPS    Take 1 capsule by mouth daily.   POLYETHYLENE GLYCOL (MIRALAX / GLYCOLAX) PACKET    Take 17 g by mouth daily.   QUETIAPINE (SEROQUEL) 50 MG TABLET    Take 1 tablet (50 mg total) by mouth daily.   SERTRALINE (ZOLOFT) 100 MG TABLET    Take one tablet by mouth once daily   VITAMIN B-12 (CYANOCOBALAMIN) 100 MCG TABLET    Take 1 tablet (100 mcg total) by mouth daily.  Modified Medications   No medications on file  Discontinued Medications   ASPIRIN EC 81 MG EC TABLET    Take 1 tablet (81 mg total) by mouth daily.     Review of Systems  Unable to perform ROS: Psychiatric disorder    Filed Vitals:   03/03/15 1048  BP: 158/78  Pulse: 92  Temp: 98.1 F (36.7 C)  TempSrc: Oral  Resp: 20  Height: _0  (1.575 m)  Weight: 108 lb  12.8 oz (49.351 kg)  SpO2: 84%   Body mass index is 19.89 kg/(m^2).  Physical Exam  Constitutional: She appears well-developed and well-nourished. No distress.  Awake and alert. Sitting in chair  Eyes: Lids are normal. Pupils are equal, round, and reactive to light. No scleral icterus.  Cardiovascular: Normal rate, regular rhythm and intact distal pulses.  Exam reveals  no gallop and no friction rub.   Murmur heard. Pulmonary/Chest: Effort normal and breath sounds normal. No respiratory distress. She has no wheezes. She has no rhonchi. She has no rales. She exhibits tenderness and swelling. She exhibits no bony tenderness.    Neurological: She is alert.  Skin: Skin is warm and dry. Abrasion and bruising noted. No rash noted.     Psychiatric: She has a normal mood and affect. Her behavior is normal.     Labs reviewed: Admission on 02/26/2015, Discharged on 03/01/2015  Component Date Value Ref Range Status  . WBC 02/26/2015 5.2  4.0 - 10.5 K/uL Final  . RBC 02/26/2015 3.71* 3.87 - 5.11 MIL/uL Final  . Hemoglobin 02/26/2015 11.6* 12.0 - 15.0 g/dL Final  . HCT 02/26/2015 34.7* 36.0 - 46.0 % Final  . MCV 02/26/2015 93.5  78.0 - 100.0 fL Final  . MCH 02/26/2015 31.3  26.0 - 34.0 pg Final  . MCHC 02/26/2015 33.4  30.0 - 36.0 g/dL Final  . RDW 02/26/2015 13.5  11.5 - 15.5 % Final  . Platelets 02/26/2015 181  150 - 400 K/uL Final  . Sodium 02/26/2015 141  135 - 145 mmol/L Final  . Potassium 02/26/2015 4.1  3.5 - 5.1 mmol/L Final  . Chloride 02/26/2015 106  96 - 112 mmol/L Final  . CO2 02/26/2015 28  19 - 32 mmol/L Final  . Glucose, Bld 02/26/2015 101* 70 - 99 mg/dL Final  . BUN 02/26/2015 21  6 - 23 mg/dL Final  . Creatinine, Ser 02/26/2015 0.76  0.50 - 1.10 mg/dL Final  . Calcium 02/26/2015 9.5  8.4 - 10.5 mg/dL Final  . Total Protein 02/26/2015 6.3  6.0 - 8.3 g/dL Final  . Albumin 02/26/2015 4.4  3.5 - 5.2 g/dL Final  . AST 02/26/2015 17  0 - 37 U/L Final  . ALT 02/26/2015 13  0  - 35 U/L Final  . Alkaline Phosphatase 02/26/2015 70  39 - 117 U/L Final  . Total Bilirubin 02/26/2015 0.5  0.3 - 1.2 mg/dL Final  . GFR calc non Af Amer 02/26/2015 74* >90 mL/min Final  . GFR calc Af Amer 02/26/2015 85* >90 mL/min Final   Comment: (NOTE) The eGFR has been calculated using the CKD EPI equation. This calculation has not been validated in all clinical situations. eGFR's persistently <90 mL/min signify possible Chronic Kidney Disease.   . Anion gap 02/26/2015 7  5 - 15 Final  . Color, Urine 02/26/2015 YELLOW  YELLOW Final  . APPearance 02/26/2015 CLEAR  CLEAR Final  . Specific Gravity, Urine 02/26/2015 <1.005* 1.005 - 1.030 Final  . pH 02/26/2015 7.0  5.0 - 8.0 Final  . Glucose, UA 02/26/2015 NEGATIVE  NEGATIVE mg/dL Final  . Hgb urine dipstick 02/26/2015 NEGATIVE  NEGATIVE Final  . Bilirubin Urine 02/26/2015 NEGATIVE  NEGATIVE Final  . Ketones, ur 02/26/2015 NEGATIVE  NEGATIVE mg/dL Final  . Protein, ur 02/26/2015 NEGATIVE  NEGATIVE mg/dL Final  . Urobilinogen, UA 02/26/2015 0.2  0.0 - 1.0 mg/dL Final  . Nitrite 02/26/2015 NEGATIVE  NEGATIVE Final  . Leukocytes, UA 02/26/2015 NEGATIVE  NEGATIVE Final   MICROSCOPIC NOT DONE ON URINES WITH NEGATIVE PROTEIN, BLOOD, LEUKOCYTES, NITRITE, OR GLUCOSE <1000 mg/dL.  . Troponin i, poc 02/26/2015 0.01  0.00 - 0.08 ng/mL Final  . Comment 3 02/26/2015          Final   Comment: Due to the release kinetics of cTnI, a negative result within the first hours of  the onset of symptoms does not rule out myocardial infarction with certainty. If myocardial infarction is still suspected, repeat the test at appropriate intervals.   . Lactic Acid, Venous 02/26/2015 0.39* 0.5 - 2.0 mmol/L Final  . Magnesium 02/27/2015 2.0  1.5 - 2.5 mg/dL Final  . Phosphorus 02/27/2015 3.7  2.3 - 4.6 mg/dL Final  . TSH 02/27/2015 19.354* 0.350 - 4.500 uIU/mL Final  . Sodium 02/27/2015 138  135 - 145 mmol/L Final  . Potassium 02/27/2015 3.9  3.5 - 5.1  mmol/L Final  . Chloride 02/27/2015 107  96 - 112 mmol/L Final  . CO2 02/27/2015 27  19 - 32 mmol/L Final  . Glucose, Bld 02/27/2015 92  70 - 99 mg/dL Final  . BUN 02/27/2015 19  6 - 23 mg/dL Final  . Creatinine, Ser 02/27/2015 0.69  0.50 - 1.10 mg/dL Final  . Calcium 02/27/2015 9.0  8.4 - 10.5 mg/dL Final  . Total Protein 02/27/2015 6.1  6.0 - 8.3 g/dL Final  . Albumin 02/27/2015 4.1  3.5 - 5.2 g/dL Final  . AST 02/27/2015 17  0 - 37 U/L Final  . ALT 02/27/2015 13  0 - 35 U/L Final  . Alkaline Phosphatase 02/27/2015 62  39 - 117 U/L Final  . Total Bilirubin 02/27/2015 0.6  0.3 - 1.2 mg/dL Final  . GFR calc non Af Amer 02/27/2015 76* >90 mL/min Final  . GFR calc Af Amer 02/27/2015 88* >90 mL/min Final   Comment: (NOTE) The eGFR has been calculated using the CKD EPI equation. This calculation has not been validated in all clinical situations. eGFR's persistently <90 mL/min signify possible Chronic Kidney Disease.   . Anion gap 02/27/2015 4* 5 - 15 Final  . WBC 02/27/2015 5.0  4.0 - 10.5 K/uL Final  . RBC 02/27/2015 3.77* 3.87 - 5.11 MIL/uL Final  . Hemoglobin 02/27/2015 11.9* 12.0 - 15.0 g/dL Final  . HCT 02/27/2015 35.6* 36.0 - 46.0 % Final  . MCV 02/27/2015 94.4  78.0 - 100.0 fL Final  . MCH 02/27/2015 31.6  26.0 - 34.0 pg Final  . MCHC 02/27/2015 33.4  30.0 - 36.0 g/dL Final  . RDW 02/27/2015 13.6  11.5 - 15.5 % Final  . Platelets 02/27/2015 182  150 - 400 K/uL Final  . Troponin I 02/27/2015 <0.03  <0.031 ng/mL Final   Comment:        NO INDICATION OF MYOCARDIAL INJURY.   . Troponin I 02/27/2015 <0.03  <0.031 ng/mL Final   Comment:        NO INDICATION OF MYOCARDIAL INJURY.   . Troponin I 02/27/2015 <0.03  <0.031 ng/mL Final   Comment:        NO INDICATION OF MYOCARDIAL INJURY.   . Hgb A1c MFr Bld 02/27/2015 5.5  4.8 - 5.6 % Final   Comment: (NOTE)         Pre-diabetes: 5.7 - 6.4         Diabetes: >6.4         Glycemic control for adults with diabetes: <7.0   .  Mean Plasma Glucose 02/27/2015 111   Final   Comment: (NOTE) Performed At: [facility] [place], [place] [id number] Lindon Romp MD [id number]   . Fecal Occult Bld 02/27/2015 NEGATIVE  NEGATIVE Final  . MRSA by PCR 02/27/2015 NEGATIVE  NEGATIVE Final   Comment:        The GeneXpert MRSA Assay (FDA approved for NASAL specimens only), is one  component of a comprehensive MRSA colonization surveillance program. It is not intended to diagnose MRSA infection nor to guide or monitor treatment for MRSA infections.   . Free T4 02/27/2015 0.54* 0.80 - 1.80 ng/dL Final   Performed at [facility]  . Specimen Description 02/27/2015 URINE, RANDOM   Final  . Special Requests 02/27/2015 Normal   Final  . Colony Count 02/27/2015    Final                   Value:NO GROWTH Performed at [facility]   . Culture 02/27/2015    Final                   Value:NO GROWTH Performed at [facility]   . Report Status 02/27/2015 03/01/2015 FINAL   Final  Office Visit on 02/22/2015  Component Date Value Ref Range Status  . WBC 02/22/2015 6.5  3.4 - 10.8 x10E3/uL Final  . RBC 02/22/2015 3.89  3.77 - 5.28 x10E6/uL Final  . Hemoglobin 02/22/2015 12.0  11.1 - 15.9 g/dL Final  . HCT 02/22/2015 35.8  34.0 - 46.6 % Final  . MCV 02/22/2015 92  79 - 97 fL Final  . MCH 02/22/2015 30.8  26.6 - 33.0 pg Final  . MCHC 02/22/2015 33.5  31.5 - 35.7 g/dL Final  . RDW 02/22/2015 13.9  12.3 - 15.4 % Final  . Platelets 02/22/2015 207  150 - 379 x10E3/uL Final  . Neutrophils Relative % 02/22/2015 75   Final  . Lymphs 02/22/2015 18   Final  . Monocytes 02/22/2015 6   Final  . Eos 02/22/2015 1   Final  . Basos 02/22/2015 0   Final  . Neutrophils Absolute 02/22/2015 4.9  1.4 - 7.0 x10E3/uL Final  . Lymphocytes Absolute 02/22/2015 1.2  0.7 - 3.1 x10E3/uL Final  . Monocytes Absolute 02/22/2015 0.4  0.1 - 0.9 x10E3/uL Final  . Eosinophils Absolute 02/22/2015 0.1  0.0 -  0.4 x10E3/uL Final  . Basophils Absolute 02/22/2015 0.0  0.0 - 0.2 x10E3/uL Final  . Immature Granulocytes 02/22/2015 0   Final  . Immature Grans (Abs) 02/22/2015 0.0  0.0 - 0.1 x10E3/uL Final  . Glucose 02/22/2015 114* 65 - 99 mg/dL Final  . BUN 02/22/2015 18  8 - 27 mg/dL Final  . Creatinine, Ser 02/22/2015 0.75  0.57 - 1.00 mg/dL Final  . GFR calc non Af Amer 02/22/2015 72  >59 mL/min/1.73 Final  . GFR calc Af Amer 02/22/2015 83  >59 mL/min/1.73 Final  . BUN/Creatinine Ratio 02/22/2015 24  11 - 26 Final  . Sodium 02/22/2015 139  134 - 144 mmol/L Final  . Potassium 02/22/2015 4.4  3.5 - 5.2 mmol/L Final  . Chloride 02/22/2015 99  97 - 108 mmol/L Final  . CO2 02/22/2015 26  18 - 29 mmol/L Final  . Calcium 02/22/2015 9.6  8.7 - 10.3 mg/dL Final  . Total Protein 02/22/2015 6.3  6.0 - 8.5 g/dL Final  . Albumin 02/22/2015 4.5  3.5 - 4.7 g/dL Final  . Globulin, Total 02/22/2015 1.8  1.5 - 4.5 g/dL Final  . Albumin/Globulin Ratio 02/22/2015 2.5  1.1 - 2.5 Final  . Bilirubin Total 02/22/2015 0.4  0.0 - 1.2 mg/dL Final  . Alkaline Phosphatase 02/22/2015 70  39 - 117 IU/L Final  . AST 02/22/2015 17  0 - 40 IU/L Final  . ALT 02/22/2015 10  0 - 32 IU/L Final  . Hgb A1c MFr Bld  02/22/2015 5.8* 4.8 - 5.6 % Final   Comment:          Pre-diabetes: 5.7 - 6.4          Diabetes: >6.4          Glycemic control for adults with diabetes: <7.0   . Est. average glucose Bld gHb Est-m* 02/22/2015 120   Final  . TSH 02/22/2015 15.730* 0.450 - 4.500 uIU/mL Final  . Urine Culture, Routine 02/22/2015 Final report*  Final  . Result 1 02/22/2015 Enterococcus species*  Final   Comment: 50,000-100,000 colony forming units per mL Note: this isolate is vancomycin-susceptible. This information is provided for epidemiologic purposes only: vancomycin is not among the antibiotics recommended for therapy of urinary tract infections caused by Enterococcus.   . RESULT 2 02/22/2015 Comment   Final   Comment: Mixed  urogenital flora Less than 10,000 colonies/mL   . ANTIMICROBIAL SUSCEPTIBILITY 02/22/2015 Comment   Final   Comment:       ** S = Susceptible; I = Intermediate; R = Resistant **                    P = Positive; N = Negative             MICS are expressed in micrograms per mL    Antibiotic                 RSLT#1    RSLT#2    RSLT#3    RSLT#4 Ciprofloxacin                  S Levofloxacin                   S Nitrofurantoin                 S Penicillin                     S Tetracycline                   R Vancomycin                     S      Assessment/Plan   ICD-9-CM ICD-10-CM   1. Contusion of rib, left, initial encounter 922.1 S20.212A   2. Fingernail injury, right, initial encounter 959.5 S69.81XA    3rd distal traumatic removal  3. Contusion of face, initial encounter 920 S00.83XA    left maxilla    --no rib xray indicated at this time. If becomes SOB, will need further eval  --continue local care to fingrnail with daily dressing changes with neosporin application  --may apply cool compresses prn to bruised areas  --keep appt with Dr Mariea Clonts as scheduled  Cordella Register. Perlie Gold  [facility] [address] [place], [place] [address] [phone] Office (Wednesdays and Fridays 8 AM - 5 PM) [phone] Cell (Monday-Friday 8 AM - 5 PM)

## 2015-03-11 ENCOUNTER — Telehealth: Payer: Self-pay | Admitting: *Deleted

## 2015-03-11 ENCOUNTER — Ambulatory Visit: Payer: Medicare Other | Admitting: Internal Medicine

## 2015-03-11 DIAGNOSIS — R269 Unspecified abnormalities of gait and mobility: Secondary | ICD-10-CM

## 2015-03-11 DIAGNOSIS — Z9181 History of falling: Secondary | ICD-10-CM

## 2015-03-11 NOTE — Telephone Encounter (Signed)
Raynelle Fanning with [facility] called and stated that you saw the patient on 03-03-2015 and wants to know if you would order home health PT from that visit. Please Advise.

## 2015-03-12 NOTE — Telephone Encounter (Signed)
Order placed for PT 

## 2015-03-12 NOTE — Telephone Encounter (Signed)
Ok for home health PT eval and treat for hx fall, unsteady gait

## 2015-03-15 ENCOUNTER — Encounter: Payer: Self-pay | Admitting: Internal Medicine

## 2015-03-15 ENCOUNTER — Ambulatory Visit (INDEPENDENT_AMBULATORY_CARE_PROVIDER_SITE_OTHER): Payer: Medicare Other | Admitting: Internal Medicine

## 2015-03-15 VITALS — BP 112/68 | HR 85 | Temp 98.0°F | Ht 62.0 in | Wt 110.0 lb

## 2015-03-15 DIAGNOSIS — Z9181 History of falling: Secondary | ICD-10-CM

## 2015-03-15 DIAGNOSIS — G8929 Other chronic pain: Secondary | ICD-10-CM

## 2015-03-15 DIAGNOSIS — R269 Unspecified abnormalities of gait and mobility: Secondary | ICD-10-CM

## 2015-03-15 DIAGNOSIS — N3000 Acute cystitis without hematuria: Secondary | ICD-10-CM

## 2015-03-15 DIAGNOSIS — F0151 Vascular dementia with behavioral disturbance: Secondary | ICD-10-CM | POA: Diagnosis not present

## 2015-03-15 DIAGNOSIS — F0153 Vascular dementia, unspecified severity, with mood disturbance: Secondary | ICD-10-CM

## 2015-03-15 DIAGNOSIS — F329 Major depressive disorder, single episode, unspecified: Secondary | ICD-10-CM

## 2015-03-15 DIAGNOSIS — R410 Disorientation, unspecified: Secondary | ICD-10-CM

## 2015-03-15 DIAGNOSIS — R1031 Right lower quadrant pain: Secondary | ICD-10-CM | POA: Diagnosis not present

## 2015-03-15 DIAGNOSIS — F0150 Vascular dementia without behavioral disturbance: Secondary | ICD-10-CM

## 2015-03-15 NOTE — Progress Notes (Signed)
Patient ID: Jane [name], female   DOB: [birth year], 81 y.o.   MRN: [id number]   Location:  [facility]  Code Status: DNR Goals of Care: Advanced Directive information Does patient have an advance directive?: Yes, Type of Advance Directive: Out of facility DNR (pink MOST or yellow form);Healthcare Power of [place];Living will, Pre-existing out of facility DNR order (yellow form or pink MOST form): Pink MOST form placed in chart (order not valid for inpatient use), Does patient want to make changes to advanced directive?: No - Patient declined   Allergies  Allergen Reactions  . Celebrex [Celecoxib]     unknown  . Chlorzoxazone     unknown    Chief Complaint  Patient presents with  . Hospitalization Follow-up    Patient was seen in hospital for weakness, vertigo and near syncope on 02/26/15. Patient still with vertigo off/on (referral pending for [facility] PT)     HPI: Patient is a 81 y.o. white female seen in the office today for hospital f/u--she was in hospital 4/22-4/25.  Hallucinations of music were more prominent.  Was angry and confused in hospital b/c they wouldn't change the channel.  Was having vertigo on and off.  Was found to have a UTI when checked in ED (had not grown out enough bacteria and had mixed flora--was not cath specimen here in office).  Unclear if she had a straight cath specimen at the hospital either.    Says that when she was in the hospital someone licked the IV and placed it in her arm.     Is shuffling and her walker got a rug.  Has therapy order pending at home.    MRI was done at hospital revealing vascular cause to her dementia--basal ganglia show old strokes in them and that could explain her worsening shuffling gait--discussed with [name] today.  Increase in zoloft is helping from last visit.  Had only had 2 pills of the new dose of thyroid medication when it was rechecked at the hospital and TSH was still  high.    Review of Systems:  Review of Systems  Constitutional: Negative for fever and chills.  Respiratory: Negative for shortness of breath.   Cardiovascular: Negative for chest pain.  Gastrointestinal: Negative for abdominal pain, constipation, blood in stool and melena.  Genitourinary: Negative for dysuria, urgency and frequency.  Musculoskeletal: Positive for falls.  Neurological: Positive for dizziness. Negative for loss of consciousness and headaches.  Psychiatric/Behavioral: Positive for depression, hallucinations and memory loss.    Past Medical History  Diagnosis Date  . Hypertension   . Impacted cerumen   . Abnormality of gait   . Anal fissure   . Loss of weight   . Hyposmolality and/or hyponatremia   . Pain in limb   . Other vitamin B12 deficiency anemia   . Chest pain, unspecified   . Anemia, unspecified   . Anxiety state, unspecified   . Abdominal pain, right upper quadrant   . Other pulmonary embolism and infarction   . Intestinal or peritoneal adhesions with obstruction (postoperative) (postinfection)   . Long term (current) use of anticoagulants   . Abdominal pain, right lower quadrant   . Essential and other specified forms of tremor   . Lumbago   . Unspecified hypothyroidism   . Major depressive disorder, single episode, unspecified   . Other and unspecified hyperlipidemia   . Macular degeneration (senile) of retina, unspecified   . Unspecified glaucoma   .  Unspecified cataract   . Unspecified hearing loss   . Unspecified essential hypertension   . Osteoarthrosis, unspecified whether generalized or localized, unspecified site   . Osteoporosis, unspecified   . Abnormal involuntary movements(781.0)   . Emphysema of lung   . CAP (community acquired pneumonia) 01/31/2014  . Postoperative anemia due to acute blood loss 01/31/2014  . Laceration of left ear canal 04/13/2014  . Closed left hip fracture 06/25/2014  . Gastritis 02/13/2013  . Hyperlipidemia      Past Surgical History  Procedure Laterality Date  . Excisional hemorrhoidectomy  1980  . Bladder suspension  1993  . Back surgery  2003  . Abdominal hysterectomy    . Trigger finger release  2009    Dr Teressa Senter  . Intramedullary (im) nail intertrochanteric Right 01/27/2014    Procedure: INTRAMEDULLARY (IM) NAIL INTERTROCHANTRIC;  Surgeon: Sheral Apley, MD;  Location: [facility] OR;  Service: Orthopedics;  Laterality: Right;  . Intramedullary (im) nail intertrochanteric Left 06/26/2014    Procedure: LEFT HIP INTRAMEDULLARY (IM) NAIL;  Surgeon: Cheral Almas, MD;  Location: [facility] OR;  Service: Orthopedics;  Laterality: Left;    Social History:   reports that she has quit smoking. Her smoking use included Cigarettes. She quit after 40 years of use. She does not have any smokeless tobacco history on file. She reports that she does not drink alcohol or use illicit drugs.  Family History  Problem Relation Age of Onset  . Congestive Heart Failure Mother   . Macular degeneration Mother   . Heart disease Mother   . Cancer Sister     lung cancer  . Arthritis Daughter   . Heart disease Daughter   . Glaucoma Sister   . Cataracts Sister     Medications: Patient's Medications  New Prescriptions   No medications on file  Previous Medications   ALBUTEROL (PROVENTIL HFA;VENTOLIN HFA) 108 (90 BASE) MCG/ACT INHALER    Inhale 2 puffs into the lungs every 6 (six) hours as needed. Shortness of breath.   AMLODIPINE (NORVASC) 5 MG TABLET    Take 1 tablet (5 mg total) by mouth daily.   CALCIUM CARBONATE (TUMS - DOSED IN MG ELEMENTAL CALCIUM) 500 MG CHEWABLE TABLET    Chew 1 tablet by mouth daily as needed for indigestion or heartburn.   CALCIUM-VITAMIN D (OSCAL WITH D) 500-200 MG-UNIT PER TABLET    Take 1 tablet by mouth daily.   DICYCLOMINE (BENTYL) 10 MG CAPSULE    Take 10 mg by mouth at bedtime.   FEXOFENADINE (ALLEGRA) 180 MG TABLET    Take 180 mg by mouth daily as needed for allergies or  rhinitis.   HYDROCODONE-ACETAMINOPHEN (NORCO/VICODIN) 5-325 MG PER TABLET    Take one tablet by mouth three times daily with meals and One tablet at bedtime for pain   HYDROCORTISONE (ANUSOL-HC) 2.5 % RECTAL CREAM    Place 1 application rectally daily as needed.   LATANOPROST (XALATAN) 0.005 % OPHTHALMIC SOLUTION    Place 1 drop into both eyes at bedtime.   LEVOTHYROXINE (SYNTHROID, LEVOTHROID) 88 MCG TABLET    Take one tablet by mouth 30 minutes before breakfast for thyroid   LORAZEPAM (ATIVAN) 0.5 MG TABLET    TAKE TWO TABLETS BY MOUTH AT BEDTIME TO HELP REST   LOVASTATIN (MEVACOR) 20 MG TABLET    Take 1 tablet (20 mg total) by mouth every evening.   MIRABEGRON ER (MYRBETRIQ) 50 MG TB24 TABLET    Take one tablet by  mouth once daily for bladder   MULTIPLE VITAMINS-MINERALS (ICAPS LUTEIN & OMEGA-3) CAPS    Take 1 capsule by mouth daily.   POLYETHYLENE GLYCOL (MIRALAX / GLYCOLAX) PACKET    Take 17 g by mouth daily.   QUETIAPINE (SEROQUEL) 50 MG TABLET    Take 1 tablet (50 mg total) by mouth daily.   SERTRALINE (ZOLOFT) 100 MG TABLET    Take one tablet by mouth once daily   VITAMIN B-12 (CYANOCOBALAMIN) 100 MCG TABLET    Take 1 tablet (100 mcg total) by mouth daily.  Modified Medications   No medications on file  Discontinued Medications   AMPICILLIN (PRINCIPEN) 250 MG CAPSULE    Take 1 capsule (250 mg total) by mouth 4 (four) times daily.     Physical Exam: Filed Vitals:   03/15/15 1110  BP: 112/68  Pulse: 85  Temp: 98 F (36.7 C)  TempSrc: Oral  Height: 5\' 2"  (1.575 m)  Weight: 110 lb (49.896 kg)  SpO2: 91%  Physical Exam  Constitutional: No distress.  Cardiovascular: Normal rate, regular rhythm and intact distal pulses.   Murmur heard. Pulmonary/Chest: Effort normal and breath sounds normal. No respiratory distress.  Abdominal: Soft. Bowel sounds are normal.  Musculoskeletal: Normal range of motion.  Neurological: She is alert.  Skin: Skin is warm and dry. There is pallor.   Has small scab on left temple  Psychiatric: She has a normal mood and affect.    Labs reviewed: Basic Metabolic Panel:  Recent Labs  19/41/74 1204  02/22/15 1420 02/26/15 2229 02/27/15 0145  NA 143  < > 139 141 138  K 4.4  < > 4.4 4.1 3.9  CL 99  < > 99 106 107  CO2 26  < > 26 28 27   GLUCOSE 82  < > 114* 101* 92  BUN 23  < > 18 21 19   CREATININE 0.81  < > 0.75 0.76 0.69  CALCIUM 9.9  < > 9.6 9.5 9.0  MG  --   --   --   --  2.0  PHOS  --   --   --   --  3.7  TSH 3.050  --  15.730*  --  19.354*  < > = values in this interval not displayed. Liver Function Tests:  Recent Labs  06/28/14 0404  02/22/15 1420 02/26/15 2229 02/27/15 0145  AST 16  < > 17 17 17   ALT <5  < > 10 13 13   ALKPHOS 47  < > 70 70 62  BILITOT 0.5  < > 0.4 0.5 0.6  PROT 5.0*  < > 6.3 6.3 6.1  ALBUMIN 2.8*  --   --  4.4 4.1  < > = values in this interval not displayed. No results for input(s): LIPASE, AMYLASE in the last 8760 hours. No results for input(s): AMMONIA in the last 8760 hours. CBC:  Recent Labs  06/25/14 1145  09/15/14 1410 02/22/15 1420 02/26/15 2229 02/27/15 0145  WBC 12.3*  < > 5.8 6.5 5.2 5.0  NEUTROABS 11.0*  --  3.8 4.9  --   --   HGB 11.7*  < > 12.8 12.0 11.6* 11.9*  HCT 34.6*  < > 39.7 35.8 34.7* 35.6*  MCV 91.5  < > 93 92 93.5 94.4  PLT 170  < >  --  207 181 182  < > = values in this interval not displayed.  Lab Results  Component Value Date   HGBA1C 5.5 02/27/2015  Assessment/Plan 1. Acute delirium -seems this has resolved -less confused and having fewer of the hallucinations of music--this has been a problem even before the UTI  2. Acute cystitis without hematuria -symptoms resolved--no longer has abdominal pain  3. Impaired gait -suspect this is due to the basal ganglia small infarcts she's had in the past seen on her MRI  4. History of falling -had a recent fall striking her left temple and left ribs, has recovered well  5. Vascular dementia with  depression -imaging at the hospital has confirmed this, but also did show that the basal ganglia are involved which could explain worsening shuffling gait plus progressing dementia  6. Abdominal pain, chronic, right lower quadrant -was attributed to UTI as I suspected, but her initial urine culture did not grow out adequate bacteria--repeat at hospital did and she has completed her abx with resolution  Labs/tests ordered:  F/u tsh and lipids before next appt Next appt:  Keep appt in July  Akilah Cureton L. Florina Glas, D.O. Geriatrics [facility] [address], [place] [address] Cell Phone (Mon-Fri 8am-5pm):  [phone] On Call:  [phone] & follow prompts after 5pm & weekends Office Phone:  [phone] Office Fax:  [phone]

## 2015-03-16 DIAGNOSIS — M810 Age-related osteoporosis without current pathological fracture: Secondary | ICD-10-CM | POA: Diagnosis not present

## 2015-03-16 DIAGNOSIS — M179 Osteoarthritis of knee, unspecified: Secondary | ICD-10-CM | POA: Diagnosis not present

## 2015-03-16 DIAGNOSIS — R296 Repeated falls: Secondary | ICD-10-CM | POA: Diagnosis not present

## 2015-03-16 DIAGNOSIS — M6281 Muscle weakness (generalized): Secondary | ICD-10-CM | POA: Diagnosis not present

## 2015-03-16 DIAGNOSIS — M1611 Unilateral primary osteoarthritis, right hip: Secondary | ICD-10-CM | POA: Diagnosis not present

## 2015-03-16 DIAGNOSIS — I10 Essential (primary) hypertension: Secondary | ICD-10-CM | POA: Diagnosis not present

## 2015-03-16 DIAGNOSIS — Z87891 Personal history of nicotine dependence: Secondary | ICD-10-CM | POA: Diagnosis not present

## 2015-03-16 DIAGNOSIS — F329 Major depressive disorder, single episode, unspecified: Secondary | ICD-10-CM | POA: Diagnosis not present

## 2015-03-16 DIAGNOSIS — S6981XA Other specified injuries of right wrist, hand and finger(s), initial encounter: Secondary | ICD-10-CM | POA: Diagnosis not present

## 2015-03-16 DIAGNOSIS — J449 Chronic obstructive pulmonary disease, unspecified: Secondary | ICD-10-CM | POA: Diagnosis not present

## 2015-03-16 DIAGNOSIS — D509 Iron deficiency anemia, unspecified: Secondary | ICD-10-CM | POA: Diagnosis not present

## 2015-03-22 DIAGNOSIS — F329 Major depressive disorder, single episode, unspecified: Secondary | ICD-10-CM | POA: Diagnosis not present

## 2015-03-22 DIAGNOSIS — M6281 Muscle weakness (generalized): Secondary | ICD-10-CM | POA: Diagnosis not present

## 2015-03-22 DIAGNOSIS — S6981XA Other specified injuries of right wrist, hand and finger(s), initial encounter: Secondary | ICD-10-CM | POA: Diagnosis not present

## 2015-03-22 DIAGNOSIS — I10 Essential (primary) hypertension: Secondary | ICD-10-CM | POA: Diagnosis not present

## 2015-03-22 DIAGNOSIS — M179 Osteoarthritis of knee, unspecified: Secondary | ICD-10-CM | POA: Diagnosis not present

## 2015-03-22 DIAGNOSIS — M1611 Unilateral primary osteoarthritis, right hip: Secondary | ICD-10-CM | POA: Diagnosis not present

## 2015-03-23 ENCOUNTER — Telehealth: Payer: Self-pay | Admitting: *Deleted

## 2015-03-23 NOTE — Telephone Encounter (Signed)
Received fax from [facility]) PT with Forde Radon wanting order approved for PT therapy to follow 1wk1, 2wk4 for Therapeutic Exercises, Therapeutic Activities, Balance training, Gait training and patient education with HEP, fall prevention and safety on functional mobility. #: [phone] Fax: [phone]  Spoke with Frazier Butt at [place] and gave her verbal order.Agreed.

## 2015-03-24 DIAGNOSIS — S6981XA Other specified injuries of right wrist, hand and finger(s), initial encounter: Secondary | ICD-10-CM | POA: Diagnosis not present

## 2015-03-24 DIAGNOSIS — I10 Essential (primary) hypertension: Secondary | ICD-10-CM | POA: Diagnosis not present

## 2015-03-24 DIAGNOSIS — F329 Major depressive disorder, single episode, unspecified: Secondary | ICD-10-CM | POA: Diagnosis not present

## 2015-03-24 DIAGNOSIS — M6281 Muscle weakness (generalized): Secondary | ICD-10-CM | POA: Diagnosis not present

## 2015-03-24 DIAGNOSIS — M1611 Unilateral primary osteoarthritis, right hip: Secondary | ICD-10-CM | POA: Diagnosis not present

## 2015-03-24 DIAGNOSIS — M179 Osteoarthritis of knee, unspecified: Secondary | ICD-10-CM | POA: Diagnosis not present

## 2015-03-29 DIAGNOSIS — I10 Essential (primary) hypertension: Secondary | ICD-10-CM | POA: Diagnosis not present

## 2015-03-29 DIAGNOSIS — M179 Osteoarthritis of knee, unspecified: Secondary | ICD-10-CM | POA: Diagnosis not present

## 2015-03-29 DIAGNOSIS — M6281 Muscle weakness (generalized): Secondary | ICD-10-CM | POA: Diagnosis not present

## 2015-03-29 DIAGNOSIS — F329 Major depressive disorder, single episode, unspecified: Secondary | ICD-10-CM | POA: Diagnosis not present

## 2015-03-29 DIAGNOSIS — M1611 Unilateral primary osteoarthritis, right hip: Secondary | ICD-10-CM | POA: Diagnosis not present

## 2015-03-29 DIAGNOSIS — S6981XA Other specified injuries of right wrist, hand and finger(s), initial encounter: Secondary | ICD-10-CM | POA: Diagnosis not present

## 2015-03-30 DIAGNOSIS — H3531 Nonexudative age-related macular degeneration: Secondary | ICD-10-CM | POA: Diagnosis not present

## 2015-04-01 DIAGNOSIS — M179 Osteoarthritis of knee, unspecified: Secondary | ICD-10-CM | POA: Diagnosis not present

## 2015-04-01 DIAGNOSIS — F329 Major depressive disorder, single episode, unspecified: Secondary | ICD-10-CM | POA: Diagnosis not present

## 2015-04-01 DIAGNOSIS — M1611 Unilateral primary osteoarthritis, right hip: Secondary | ICD-10-CM | POA: Diagnosis not present

## 2015-04-01 DIAGNOSIS — M6281 Muscle weakness (generalized): Secondary | ICD-10-CM | POA: Diagnosis not present

## 2015-04-01 DIAGNOSIS — I10 Essential (primary) hypertension: Secondary | ICD-10-CM | POA: Diagnosis not present

## 2015-04-01 DIAGNOSIS — S6981XA Other specified injuries of right wrist, hand and finger(s), initial encounter: Secondary | ICD-10-CM | POA: Diagnosis not present

## 2015-04-07 ENCOUNTER — Other Ambulatory Visit: Payer: Self-pay | Admitting: *Deleted

## 2015-04-07 DIAGNOSIS — I10 Essential (primary) hypertension: Secondary | ICD-10-CM | POA: Diagnosis not present

## 2015-04-07 DIAGNOSIS — M179 Osteoarthritis of knee, unspecified: Secondary | ICD-10-CM | POA: Diagnosis not present

## 2015-04-07 DIAGNOSIS — M6281 Muscle weakness (generalized): Secondary | ICD-10-CM | POA: Diagnosis not present

## 2015-04-07 DIAGNOSIS — F329 Major depressive disorder, single episode, unspecified: Secondary | ICD-10-CM | POA: Diagnosis not present

## 2015-04-07 DIAGNOSIS — S6981XA Other specified injuries of right wrist, hand and finger(s), initial encounter: Secondary | ICD-10-CM | POA: Diagnosis not present

## 2015-04-07 DIAGNOSIS — M1611 Unilateral primary osteoarthritis, right hip: Secondary | ICD-10-CM | POA: Diagnosis not present

## 2015-04-08 ENCOUNTER — Other Ambulatory Visit: Payer: Medicare Other

## 2015-04-08 DIAGNOSIS — I10 Essential (primary) hypertension: Secondary | ICD-10-CM | POA: Diagnosis not present

## 2015-04-09 DIAGNOSIS — F329 Major depressive disorder, single episode, unspecified: Secondary | ICD-10-CM | POA: Diagnosis not present

## 2015-04-09 DIAGNOSIS — I10 Essential (primary) hypertension: Secondary | ICD-10-CM | POA: Diagnosis not present

## 2015-04-09 DIAGNOSIS — M1611 Unilateral primary osteoarthritis, right hip: Secondary | ICD-10-CM | POA: Diagnosis not present

## 2015-04-09 DIAGNOSIS — S6981XA Other specified injuries of right wrist, hand and finger(s), initial encounter: Secondary | ICD-10-CM | POA: Diagnosis not present

## 2015-04-09 DIAGNOSIS — M179 Osteoarthritis of knee, unspecified: Secondary | ICD-10-CM | POA: Diagnosis not present

## 2015-04-09 DIAGNOSIS — M6281 Muscle weakness (generalized): Secondary | ICD-10-CM | POA: Diagnosis not present

## 2015-04-09 LAB — LIPID PANEL
Chol/HDL Ratio: 3 ratio units (ref 0.0–4.4)
Cholesterol, Total: 148 mg/dL (ref 100–199)
HDL: 50 mg/dL (ref >39)
LDL Calculated: 80 mg/dL (ref 0–99)
Triglycerides: 90 mg/dL (ref 0–149)
VLDL Cholesterol Cal: 18 mg/dL (ref 5–40)

## 2015-04-09 LAB — TSH: TSH: 0.366 u[IU]/mL — ABNORMAL LOW (ref 0.450–4.500)

## 2015-04-12 ENCOUNTER — Other Ambulatory Visit: Payer: Self-pay

## 2015-04-12 DIAGNOSIS — I10 Essential (primary) hypertension: Secondary | ICD-10-CM | POA: Diagnosis not present

## 2015-04-12 DIAGNOSIS — M6281 Muscle weakness (generalized): Secondary | ICD-10-CM | POA: Diagnosis not present

## 2015-04-12 DIAGNOSIS — S6981XA Other specified injuries of right wrist, hand and finger(s), initial encounter: Secondary | ICD-10-CM | POA: Diagnosis not present

## 2015-04-12 DIAGNOSIS — M1611 Unilateral primary osteoarthritis, right hip: Secondary | ICD-10-CM | POA: Diagnosis not present

## 2015-04-12 DIAGNOSIS — M179 Osteoarthritis of knee, unspecified: Secondary | ICD-10-CM | POA: Diagnosis not present

## 2015-04-12 DIAGNOSIS — F329 Major depressive disorder, single episode, unspecified: Secondary | ICD-10-CM | POA: Diagnosis not present

## 2015-04-12 MED ORDER — LEVOTHYROXINE SODIUM 75 MCG PO TABS: 75.0000 ug | ORAL_TABLET | Freq: Every day | ORAL | Status: DC

## 2015-04-14 DIAGNOSIS — M6281 Muscle weakness (generalized): Secondary | ICD-10-CM | POA: Diagnosis not present

## 2015-04-14 DIAGNOSIS — S6981XA Other specified injuries of right wrist, hand and finger(s), initial encounter: Secondary | ICD-10-CM | POA: Diagnosis not present

## 2015-04-14 DIAGNOSIS — M1611 Unilateral primary osteoarthritis, right hip: Secondary | ICD-10-CM | POA: Diagnosis not present

## 2015-04-14 DIAGNOSIS — M179 Osteoarthritis of knee, unspecified: Secondary | ICD-10-CM | POA: Diagnosis not present

## 2015-04-14 DIAGNOSIS — I10 Essential (primary) hypertension: Secondary | ICD-10-CM | POA: Diagnosis not present

## 2015-04-14 DIAGNOSIS — F329 Major depressive disorder, single episode, unspecified: Secondary | ICD-10-CM | POA: Diagnosis not present

## 2015-04-22 ENCOUNTER — Other Ambulatory Visit: Payer: Self-pay | Admitting: Internal Medicine

## 2015-04-23 ENCOUNTER — Other Ambulatory Visit: Payer: Self-pay | Admitting: *Deleted

## 2015-04-23 MED ORDER — LORAZEPAM 0.5 MG PO TABS: ORAL_TABLET | ORAL | Status: DC

## 2015-04-28 ENCOUNTER — Other Ambulatory Visit: Payer: Self-pay | Admitting: Internal Medicine

## 2015-05-19 ENCOUNTER — Other Ambulatory Visit: Payer: Self-pay | Admitting: Internal Medicine

## 2015-05-20 ENCOUNTER — Emergency Department (HOSPITAL_COMMUNITY): Payer: Medicare Other

## 2015-05-20 ENCOUNTER — Encounter (HOSPITAL_COMMUNITY): Payer: Self-pay | Admitting: Emergency Medicine

## 2015-05-20 ENCOUNTER — Emergency Department (HOSPITAL_COMMUNITY)
Admission: EM | Admit: 2015-05-20 | Discharge: 2015-05-20 | Disposition: A | Discharge status: Home / Self Care | Payer: Medicare Other | Attending: Emergency Medicine | Admitting: Emergency Medicine

## 2015-05-20 DIAGNOSIS — Z86711 Personal history of pulmonary embolism: Secondary | ICD-10-CM | POA: Diagnosis not present

## 2015-05-20 DIAGNOSIS — R296 Repeated falls: Secondary | ICD-10-CM | POA: Diagnosis not present

## 2015-05-20 DIAGNOSIS — S3992XA Unspecified injury of lower back, initial encounter: Secondary | ICD-10-CM | POA: Diagnosis present

## 2015-05-20 DIAGNOSIS — W19XXXA Unspecified fall, initial encounter: Secondary | ICD-10-CM

## 2015-05-20 DIAGNOSIS — Y92198 Other place in other specified residential institution as the place of occurrence of the external cause: Secondary | ICD-10-CM | POA: Diagnosis not present

## 2015-05-20 DIAGNOSIS — Z8701 Personal history of pneumonia (recurrent): Secondary | ICD-10-CM | POA: Diagnosis not present

## 2015-05-20 DIAGNOSIS — M25512 Pain in left shoulder: Secondary | ICD-10-CM | POA: Diagnosis not present

## 2015-05-20 DIAGNOSIS — F329 Major depressive disorder, single episode, unspecified: Secondary | ICD-10-CM | POA: Insufficient documentation

## 2015-05-20 DIAGNOSIS — Y999 Unspecified external cause status: Secondary | ICD-10-CM | POA: Insufficient documentation

## 2015-05-20 DIAGNOSIS — H409 Unspecified glaucoma: Secondary | ICD-10-CM | POA: Insufficient documentation

## 2015-05-20 DIAGNOSIS — W010XXA Fall on same level from slipping, tripping and stumbling without subsequent striking against object, initial encounter: Secondary | ICD-10-CM | POA: Insufficient documentation

## 2015-05-20 DIAGNOSIS — I10 Essential (primary) hypertension: Secondary | ICD-10-CM | POA: Insufficient documentation

## 2015-05-20 DIAGNOSIS — S90122A Contusion of left lesser toe(s) without damage to nail, initial encounter: Secondary | ICD-10-CM | POA: Diagnosis not present

## 2015-05-20 DIAGNOSIS — J439 Emphysema, unspecified: Secondary | ICD-10-CM | POA: Diagnosis not present

## 2015-05-20 DIAGNOSIS — Z87891 Personal history of nicotine dependence: Secondary | ICD-10-CM | POA: Insufficient documentation

## 2015-05-20 DIAGNOSIS — F411 Generalized anxiety disorder: Secondary | ICD-10-CM | POA: Insufficient documentation

## 2015-05-20 DIAGNOSIS — R52 Pain, unspecified: Secondary | ICD-10-CM | POA: Diagnosis not present

## 2015-05-20 DIAGNOSIS — Z862 Personal history of diseases of the blood and blood-forming organs and certain disorders involving the immune mechanism: Secondary | ICD-10-CM | POA: Diagnosis not present

## 2015-05-20 DIAGNOSIS — H9190 Unspecified hearing loss, unspecified ear: Secondary | ICD-10-CM | POA: Diagnosis not present

## 2015-05-20 DIAGNOSIS — Z79899 Other long term (current) drug therapy: Secondary | ICD-10-CM | POA: Insufficient documentation

## 2015-05-20 DIAGNOSIS — Z8719 Personal history of other diseases of the digestive system: Secondary | ICD-10-CM | POA: Diagnosis not present

## 2015-05-20 DIAGNOSIS — M810 Age-related osteoporosis without current pathological fracture: Secondary | ICD-10-CM | POA: Diagnosis not present

## 2015-05-20 DIAGNOSIS — S300XXA Contusion of lower back and pelvis, initial encounter: Secondary | ICD-10-CM | POA: Insufficient documentation

## 2015-05-20 DIAGNOSIS — Z7901 Long term (current) use of anticoagulants: Secondary | ICD-10-CM | POA: Insufficient documentation

## 2015-05-20 DIAGNOSIS — S20229A Contusion of unspecified back wall of thorax, initial encounter: Secondary | ICD-10-CM

## 2015-05-20 DIAGNOSIS — T1490XA Injury, unspecified, initial encounter: Secondary | ICD-10-CM

## 2015-05-20 DIAGNOSIS — Y939 Activity, unspecified: Secondary | ICD-10-CM | POA: Insufficient documentation

## 2015-05-20 DIAGNOSIS — S72002D Fracture of unspecified part of neck of left femur, subsequent encounter for closed fracture with routine healing: Secondary | ICD-10-CM | POA: Diagnosis not present

## 2015-05-20 DIAGNOSIS — E785 Hyperlipidemia, unspecified: Secondary | ICD-10-CM | POA: Diagnosis not present

## 2015-05-20 DIAGNOSIS — E039 Hypothyroidism, unspecified: Secondary | ICD-10-CM | POA: Insufficient documentation

## 2015-05-20 DIAGNOSIS — M7989 Other specified soft tissue disorders: Secondary | ICD-10-CM | POA: Diagnosis not present

## 2015-05-20 DIAGNOSIS — S7222XD Displaced subtrochanteric fracture of left femur, subsequent encounter for closed fracture with routine healing: Secondary | ICD-10-CM | POA: Diagnosis not present

## 2015-05-20 DIAGNOSIS — M545 Low back pain: Secondary | ICD-10-CM | POA: Diagnosis not present

## 2015-05-20 LAB — URINALYSIS, ROUTINE W REFLEX MICROSCOPIC
Bilirubin Urine: NEGATIVE
GLUCOSE, UA: NEGATIVE mg/dL
Hgb urine dipstick: NEGATIVE
Ketones, ur: NEGATIVE mg/dL
Nitrite: NEGATIVE
Protein, ur: NEGATIVE mg/dL
SPECIFIC GRAVITY, URINE: 1.007 (ref 1.005–1.030)
UROBILINOGEN UA: 0.2 mg/dL (ref 0.0–1.0)
pH: 7 (ref 5.0–8.0)

## 2015-05-20 LAB — URINE MICROSCOPIC-ADD ON

## 2015-05-20 MED ORDER — TRAMADOL HCL 50 MG PO TABS: 50.0000 mg | ORAL_TABLET | Freq: Four times a day (QID) | ORAL | Status: DC | PRN

## 2015-05-20 NOTE — Progress Notes (Signed)
EDCM spoke to patient and her daughter Jane [name] at bedside.  Patient lives at [facility] facility but has assistance through [facility] which makes patient feel she is at ALF.  Patient receives assistance with ADL's in AM and PM and medication assistance.  Patient's daughter reports assistance with [facility] is around 24 hours.  Patient also has a fall alarm which alerts her facility 24/7.  Patient confirms her pcp is Dr. Dorris Fetch and has an appointment to see her on Monday.  Patient has a walker at home and her apartment is handicapped accessible.  Patient has recently had physical therapy with [facility] and would like to have those services again.  Will discuss with EDP.  No further EDCM needs at this time.

## 2015-05-20 NOTE — ED Provider Notes (Signed)
CSN: [id number]     Arrival date & time 05/20/15  1633 History   First MD Initiated Contact with Patient 05/20/15 1702     Chief Complaint  Patient presents with  . Fall  . Back Pain     (Consider location/radiation/quality/duration/timing/severity/associated sxs/prior Treatment) HPI Comments: The patient is an 81 year old female, she presents from an independent living center [facility] where she was transported because of a fall. She reports that she falls quite often, she had a fall yesterday when she lost her balance striking her left buttock, she was able to get up off the ground, she states that again today was she was walking with her walker she lost her balance falling backwards and again fell on her left buttock. She initially had a small amount of low back pain, when she is lying supine in the bed she has no complaints. She has no pain, no numbness, no weakness, no difficulty with urination, no history of spinal fracture or surgery. She was not given any pain medication prior to arrival. She is not anticoagulated. She was unable to get up off the ground because of her buttock and back pain.  Denies feeling ill prior to fall.  No CP, SOB or HA  Patient is a 80 y.o. female presenting with fall and back pain. The history is provided by the patient.  Fall  Back Pain   Past Medical History  Diagnosis Date  . Hypertension   . Impacted cerumen   . Abnormality of gait   . Anal fissure   . Loss of weight   . Hyposmolality and/or hyponatremia   . Pain in limb   . Other vitamin B12 deficiency anemia   . Chest pain, unspecified   . Anemia, unspecified   . Anxiety state, unspecified   . Abdominal pain, right upper quadrant   . Other pulmonary embolism and infarction   . Intestinal or peritoneal adhesions with obstruction (postoperative) (postinfection)   . Long term (current) use of anticoagulants   . Abdominal pain, right lower quadrant   . Essential and other specified  forms of tremor   . Lumbago   . Unspecified hypothyroidism   . Major depressive disorder, single episode, unspecified   . Other and unspecified hyperlipidemia   . Macular degeneration (senile) of retina, unspecified   . Unspecified glaucoma   . Unspecified cataract   . Unspecified hearing loss   . Unspecified essential hypertension   . Osteoarthrosis, unspecified whether generalized or localized, unspecified site   . Osteoporosis, unspecified   . Abnormal involuntary movements(781.0)   . Emphysema of lung   . CAP (community acquired pneumonia) 01/31/2014  . Postoperative anemia due to acute blood loss 01/31/2014  . Laceration of left ear canal 04/13/2014  . Closed left hip fracture 06/25/2014  . Gastritis 02/13/2013  . Hyperlipidemia    Past Surgical History  Procedure Laterality Date  . Excisional hemorrhoidectomy  1980  . Bladder suspension  1993  . Back surgery  2003  . Abdominal hysterectomy    . Trigger finger release  2009    Dr Teressa Senter  . Intramedullary (im) nail intertrochanteric Right 01/27/2014    Procedure: INTRAMEDULLARY (IM) NAIL INTERTROCHANTRIC;  Surgeon: Sheral Apley, MD;  Location: [facility] OR;  Service: Orthopedics;  Laterality: Right;  . Intramedullary (im) nail intertrochanteric Left 06/26/2014    Procedure: LEFT HIP INTRAMEDULLARY (IM) NAIL;  Surgeon: Cheral Almas, MD;  Location: [facility] OR;  Service: Orthopedics;  Laterality: Left;  Family History  Problem Relation Age of Onset  . Congestive Heart Failure Mother   . Macular degeneration Mother   . Heart disease Mother   . Cancer Sister     lung cancer  . Arthritis Daughter   . Heart disease Daughter   . Glaucoma Sister   . Cataracts Sister    History  Substance Use Topics  . Smoking status: Former Smoker -- 40 years    Types: Cigarettes  . Smokeless tobacco: Not on file  . Alcohol Use: No   OB History    No data available     Review of Systems  Musculoskeletal: Positive for back pain.  All other  systems reviewed and are negative.     Allergies  Celebrex and Chlorzoxazone  Home Medications   Prior to Admission medications   Medication Sig Start Date End Date Taking? Authorizing Provider  albuterol (PROVENTIL HFA;VENTOLIN HFA) 108 (90 BASE) MCG/ACT inhaler Inhale 2 puffs into the lungs every 6 (six) hours as needed. Shortness of breath. 04/13/14  Yes Tiffany L Reed, DO  amLODipine (NORVASC) 5 MG tablet TAKE ONE TABLET BY MOUTH EVERY DAY 04/28/15  Yes Tiffany L Reed, DO  latanoprost (XALATAN) 0.005 % ophthalmic solution Place 1 drop into both eyes at bedtime.   Yes Historical Provider, MD  levothyroxine (SYNTHROID, LEVOTHROID) 75 MCG tablet Take 1 tablet (75 mcg total) by mouth daily before breakfast. Take 1 tablet by mouth every other day 04/12/15  Yes Tiffany L Reed, DO  levothyroxine (SYNTHROID, LEVOTHROID) 88 MCG tablet Take 88 mcg by mouth daily before breakfast. Take 1 tablet by mouth every other day   Yes Historical Provider, MD  LORazepam (ATIVAN) 0.5 MG tablet TAKE TWO TABLETS BY MOUTH AT BEDTIME TO HELP REST 04/23/15  Yes Tiffany L Reed, DO  lovastatin (MEVACOR) 20 MG tablet TAKE ONE TABLET BY MOUTH EVERY EVENING 04/28/15  Yes Tiffany L Reed, DO  MYRBETRIQ 50 MG TB24 tablet TAKE ONE TABLET BY MOUTH EVERY DAY FOR BLADDER 05/19/15  Yes Tiffany L Reed, DO  polyethylene glycol (MIRALAX / GLYCOLAX) packet Take 17 g by mouth daily. 03/01/15  Yes Christina P Rama, MD  QUEtiapine (SEROQUEL) 50 MG tablet Take 1 tablet (50 mg total) by mouth daily. 02/22/15  Yes Tiffany L Reed, DO  sertraline (ZOLOFT) 100 MG tablet Take one tablet by mouth once daily 09/15/14  Yes Mahima Pandey, MD  vitamin B-12 (CYANOCOBALAMIN) 100 MCG tablet Take 1 tablet (100 mcg total) by mouth daily. 09/15/14  Yes Mahima Glade Lloyd, MD  calcium carbonate (TUMS - DOSED IN MG ELEMENTAL CALCIUM) 500 MG chewable tablet Chew 1 tablet by mouth daily as needed for indigestion or heartburn.    Historical Provider, MD  calcium-vitamin D  (OSCAL WITH D) 500-200 MG-UNIT per tablet Take 1 tablet by mouth daily.    Historical Provider, MD  dicyclomine (BENTYL) 10 MG capsule Take 10 mg by mouth at bedtime.    Historical Provider, MD  fexofenadine (ALLEGRA) 180 MG tablet Take 180 mg by mouth daily as needed for allergies or rhinitis.    Historical Provider, MD  HYDROcodone-acetaminophen (NORCO/VICODIN) 5-325 MG per tablet Take one tablet by mouth three times daily with meals and One tablet at bedtime for pain Patient not taking: Reported on 05/20/2015 02/22/15   Tiffany L Reed, DO  hydrocortisone (ANUSOL-HC) 2.5 % rectal cream Place 1 application rectally daily as needed.    Historical Provider, MD  Multiple Vitamins-Minerals (ICAPS LUTEIN & OMEGA-3) CAPS Take 1  capsule by mouth daily.    Historical Provider, MD  traMADol (ULTRAM) 50 MG tablet Take 1 tablet (50 mg total) by mouth every 6 (six) hours as needed. 05/20/15   Eber Hong, MD   BP 148/71 mmHg  Pulse 79  Temp(Src) 98.2 F (36.8 C) (Oral)  Resp 16  SpO2 92% Physical Exam  Constitutional: She appears well-developed and well-nourished. No distress.  HENT:  Head: Normocephalic and atraumatic.  Mouth/Throat: Oropharynx is clear and moist. No oropharyngeal exudate.  Eyes: Conjunctivae and EOM are normal. Pupils are equal, round, and reactive to light. Right eye exhibits no discharge. Left eye exhibits no discharge. No scleral icterus.  Neck: Normal range of motion. Neck supple. No JVD present. No thyromegaly present.  Cardiovascular: Normal rate, regular rhythm, normal heart sounds and intact distal pulses.  Exam reveals no gallop and no friction rub.   No murmur heard. Pulmonary/Chest: Effort normal and breath sounds normal. No respiratory distress. She has no wheezes. She has no rales.  Abdominal: Soft. Bowel sounds are normal. She exhibits no distension and no mass. There is no tenderness.  Musculoskeletal: Normal range of motion. She exhibits tenderness ( ttp over the  lumbar spine and L buttock - no bruising or hematomas.). She exhibits no edema.  Lymphadenopathy:    She has no cervical adenopathy.  Neurological: She is alert. Coordination normal.  There is a leg length discrepancy right leg longer than the left leg, patient states this is a chronic finding since having hip replacement surgery. She also has ability to straight leg raise without any difficulty on both sides, there is normal range of motion of the bilateral hips and knees without pain.  Skin: Skin is warm and dry. No rash noted. No erythema.  Psychiatric: She has a normal mood and affect. Her behavior is normal.  Nursing note and vitals reviewed.   ED Course  Procedures (including critical care time) Labs Review Labs Reviewed  URINALYSIS, ROUTINE W REFLEX MICROSCOPIC (NOT AT [facility]) - Abnormal; Notable for the following:    Leukocytes, UA SMALL (*)    All other components within normal limits  URINE MICROSCOPIC-ADD ON - Abnormal; Notable for the following:    Squamous Epithelial / LPF FEW (*)    All other components within normal limits    Imaging Review Dg Lumbar Spine Complete  05/20/2015   CLINICAL DATA:  Recent falls with pain  EXAM: LUMBAR SPINE - COMPLETE 4+ VIEW  COMPARISON:  June 27, 2014  FINDINGS: Frontal, lateral, spot lumbosacral lateral, and bilateral oblique views were obtained. There are 5 non-rib-bearing lumbar type vertebral bodies. There is lumbar levoscoliosis with a rotatory component. There is no fracture. There is 4 mm of anterolisthesis of L4 on L5, stable. No other spondylolisthesis. There is marked disc space narrowing at L4-5. There is moderate disc space narrowing at all other levels. There is facet osteoarthritic change at L4-5 and L5-S1 bilaterally. There is extensive atherosclerotic calcification in the aorta and common iliac arteries. Postoperative changes noted in each proximal femur.  IMPRESSION: Multilevel osteoarthritic change, essentially stable. No  fracture. Stable mild spondylolisthesis of L4-5. Stable scoliosis. Atherosclerotic change noted.   Electronically Signed   By: Bretta Bang III M.D.   On: 05/20/2015 18:18   Dg Shoulder Left  05/20/2015   CLINICAL DATA:  Multiple recent falls with left shoulder pain, initial encounter  EXAM: LEFT SHOULDER - 2+ VIEW  COMPARISON:  None.  FINDINGS: There is no evidence of fracture or dislocation.  There is no evidence of arthropathy or other focal bone abnormality. Soft tissues are unremarkable.  IMPRESSION: No acute abnormality noted.   Electronically Signed   By: Alcide Clever M.D.   On: 05/20/2015 18:17   Dg Foot Complete Left  05/20/2015   CLINICAL DATA:  Two falls in past 2 days  EXAM: LEFT FOOT - COMPLETE 3+ VIEW  COMPARISON:  None.  FINDINGS: Frontal, oblique, and lateral views obtained. There is soft tissue swelling over the dorsum of the foot. Bones are diffusely osteoporotic. There is no fracture or dislocation. There is moderate narrowing of all MTP, PIP, and DIP joints. No erosive change.  IMPRESSION: No fracture or dislocation apparent. Soft tissue swelling over the dorsum of the foot. Narrowing of multiple distal joints. Bones diffusely osteoporotic.   Electronically Signed   By: Bretta Bang III M.D.   On: 05/20/2015 20:02   Dg Hip Unilat With Pelvis 2-3 Views Left  05/20/2015   CLINICAL DATA:  Left hip pain  EXAM: DG HIP (WITH OR WITHOUT PELVIS) 2-3V LEFT  COMPARISON:  None.  FINDINGS: Status post ORIF of an subtrochanteric left hip fracture. Residual fracture deformity.  Status post ORIF of a prior right hip fracture, without deformity.  No evidence of acute fracture or dislocation.  Bilateral hip joint spaces are symmetric.  Visualized bony pelvis appears intact.  Degenerative changes of the lower lumbar spine.  Vascular calcifications.  IMPRESSION: Status post ORIF of the bilateral hips.  No evidence of acute fracture or dislocation.   Electronically Signed   By: Charline Bills  M.D.   On: 05/20/2015 18:06      MDM   Final diagnoses:  Trauma  Contusion of back, unspecified laterality, initial encounter  Contusion of toe, left, initial encounter    Vital signs are unremarkable except for some mild hypertension, will sent for x-rays of the hip pelvis and lower back, rule out fracture, patient has chronic tremor, states that she has chronic imbalance, none of this is new, she is not having any pain and refuses pain medication at this time.  Pt has neg imaging- d/w pt and daughter - will d/c home.  Eber Hong, MD 05/20/15 2056

## 2015-05-20 NOTE — ED Notes (Signed)
Per EMS pt comes from [facility] in [place] for fall.  Pt c/o lower back pain.  Pt doesn't appear to be on any blood thinners according to her medication list.  Pt is A&O.

## 2015-05-20 NOTE — Care Management Note (Signed)
Case Management Note  Patient Details  Name: Jane [name] MRN: [id number] Date of Birth: Oct 02, 1925  Subjective/Objective:  Patient lives in independent living facility with frequent falls at home                  Action/Plan: Discussed home health services with patient and her daughter at bedside.   Expected Discharge Date:   05/20/2015               Expected Discharge Plan:  Home w Home Health Services  In-House Referral:     Discharge planning Services     Post Acute Care Choice:  Home Health Choice offered to:  Adult Children  DME Arranged:   none DME Agency:     HH Arranged:  PT HH Agency:  [facility]  Status of Service:   completed signed off  Medicare Important Message Given:    Date Medicare IM Given:    Medicare IM give by:    Date Additional Medicare IM Given:    Additional Medicare Important Message give by:     If discussed at Long Length of Stay Meetings, dates discussed:    Additional CommentsRadford Pax, RN 05/20/2015, 9:42 PM

## 2015-05-20 NOTE — Discharge Instructions (Signed)

## 2015-05-20 NOTE — ED Notes (Signed)
Reports missing a pair of gray slippers.

## 2015-05-20 NOTE — ED Notes (Signed)
Pt resting comfortably waiting for xray results.

## 2015-05-20 NOTE — Progress Notes (Signed)
CSW attempted to meet with pt at bedside. However, she was transported to x-ray. Daughter was present. Daughter confirms that the pt presents to [facility] due to a fall.   Daughter states that the pt had a R hip replacement last October. She informed CSW that ever since the surgery the pt's feet have been getting "caught up" with each other.  Daughter confirms that the pt is from [facility], and that she lives in their independent unit. Daughter states that the staff assist the pt with dressing in the morning. She also says that they provide medication management for the pt during the morning and evenings.  Daughter informed CSW that she is the pt's POA. Daughter states that she is the pt's primary support and says she lives in [place].  Daughter states that she visits the pt x2-3 a week.  Daughter/[name] Sharma Covert [phone]  Trish Mage [phone] ED CSW 05/20/2015 6:28 PM

## 2015-05-20 NOTE — ED Notes (Signed)
Bed: WA18 Expected date:  Expected time:  Means of arrival:  Comments: RES B 

## 2015-05-24 ENCOUNTER — Ambulatory Visit (INDEPENDENT_AMBULATORY_CARE_PROVIDER_SITE_OTHER): Payer: Medicare Other | Admitting: Internal Medicine

## 2015-05-24 ENCOUNTER — Encounter: Payer: Self-pay | Admitting: Internal Medicine

## 2015-05-24 VITALS — BP 122/52 | HR 108 | Temp 97.5°F | Resp 20 | Ht 62.0 in | Wt 115.4 lb

## 2015-05-24 DIAGNOSIS — H9192 Unspecified hearing loss, left ear: Secondary | ICD-10-CM

## 2015-05-24 DIAGNOSIS — E038 Other specified hypothyroidism: Secondary | ICD-10-CM

## 2015-05-24 DIAGNOSIS — F0151 Vascular dementia with behavioral disturbance: Secondary | ICD-10-CM | POA: Diagnosis not present

## 2015-05-24 DIAGNOSIS — E034 Atrophy of thyroid (acquired): Secondary | ICD-10-CM

## 2015-05-24 DIAGNOSIS — S90122S Contusion of left lesser toe(s) without damage to nail, sequela: Secondary | ICD-10-CM

## 2015-05-24 DIAGNOSIS — J449 Chronic obstructive pulmonary disease, unspecified: Secondary | ICD-10-CM

## 2015-05-24 DIAGNOSIS — F329 Major depressive disorder, single episode, unspecified: Secondary | ICD-10-CM

## 2015-05-24 DIAGNOSIS — R609 Edema, unspecified: Secondary | ICD-10-CM

## 2015-05-24 DIAGNOSIS — Z9181 History of falling: Secondary | ICD-10-CM | POA: Diagnosis not present

## 2015-05-24 DIAGNOSIS — F0153 Vascular dementia, unspecified severity, with mood disturbance: Secondary | ICD-10-CM

## 2015-05-24 DIAGNOSIS — F0150 Vascular dementia without behavioral disturbance: Secondary | ICD-10-CM

## 2015-05-24 MED ORDER — ALBUTEROL SULFATE HFA 108 (90 BASE) MCG/ACT IN AERS: 2.0000 | INHALATION_SPRAY | Freq: Four times a day (QID) | RESPIRATORY_TRACT | Status: DC | PRN

## 2015-05-24 MED ORDER — HYDROCODONE-ACETAMINOPHEN 5-325 MG PO TABS: ORAL_TABLET | ORAL | Status: DC

## 2015-05-24 NOTE — Patient Instructions (Signed)
Elevate your feet.  Try to avoid salty snacks and sides like soup.   Drink plenty of water. If your legs are still getting too swollen, use your compression hose.

## 2015-05-24 NOTE — Progress Notes (Signed)
Patient ID: Jane [name], female   DOB: [birth year], 81 y.o.   MRN: [id number]   Location:  [facility]  Code Status: DNR Goals of Care: Advanced Directive information Does patient have an advance directive?: Yes, Type of Advance Directive: Healthcare Power of [place];Living will;Out of facility DNR (pink MOST or yellow form), Pre-existing out of facility DNR order (yellow form or pink MOST form): Yellow form placed in chart (order not valid for inpatient use), Does patient want to make changes to advanced directive?: No - Patient declined   Chief Complaint  Patient presents with  . Medical Management of Chronic Issues    fell 3 timesin the last week, left ear checked for wax,    HPI: Patient is a 81 y.o. white female seen in the office today for medical mgt of chronic diseases, f/u of recent ED visit last Thursday and current concerns about ear wax in left ear and swollen ankles.  She has fallen 3x in 1 week.  One fall happened in the middle of the night when she was not wearing shoes.  Has callous of left ball of foot also with bandaid.  4th toe on left foot:  Not broken, but badly bruised.  Is less bruised now today.  Back pain and left buttock down left leg unchanged--has tingling in left foot.  Has large bruise on inside of left arm near elbow also.  BP was then elevated, but 6 people were standing around her.  It came down when she was there.  It's normal here today. Pulse today initially 108.    Has swelling in ankles--patient notes she has had some salty meats and chips.  Seems like it's since the summer menu at [facility] began.    Asks me to check her ear b/c she is having more difficulty hearing out of it despite her hearing aides and thinks there's wax in there.  Review of Systems:  Review of Systems  HENT: Positive for hearing loss. Negative for congestion, ear discharge and ear pain.   Eyes:       Glasses  Respiratory: Negative  for cough and shortness of breath.   Cardiovascular: Positive for leg swelling. Negative for chest pain.  Gastrointestinal: Positive for constipation. Negative for abdominal pain.       Controlled at present  Genitourinary: Negative for dysuria.  Musculoskeletal: Positive for back pain and falls.  Skin: Negative for itching and rash.  Neurological: Negative for dizziness and loss of consciousness.  Endo/Heme/Allergies: Bruises/bleeds easily.  Psychiatric/Behavioral: Positive for memory loss. Negative for depression and hallucinations. The patient is not nervous/anxious.     Past Medical History  Diagnosis Date  . Hypertension   . Impacted cerumen   . Abnormality of gait   . Anal fissure   . Loss of weight   . Hyposmolality and/or hyponatremia   . Pain in limb   . Other vitamin B12 deficiency anemia   . Chest pain, unspecified   . Anemia, unspecified   . Anxiety state, unspecified   . Abdominal pain, right upper quadrant   . Other pulmonary embolism and infarction   . Intestinal or peritoneal adhesions with obstruction (postoperative) (postinfection)   . Long term (current) use of anticoagulants   . Abdominal pain, right lower quadrant   . Essential and other specified forms of tremor   . Lumbago   . Unspecified hypothyroidism   . Major depressive disorder, single episode, unspecified   . Other  and unspecified hyperlipidemia   . Macular degeneration (senile) of retina, unspecified   . Unspecified glaucoma   . Unspecified cataract   . Unspecified hearing loss   . Unspecified essential hypertension   . Osteoarthrosis, unspecified whether generalized or localized, unspecified site   . Osteoporosis, unspecified   . Abnormal involuntary movements(781.0)   . Emphysema of lung   . CAP (community acquired pneumonia) 01/31/2014  . Postoperative anemia due to acute blood loss 01/31/2014  . Laceration of left ear canal 04/13/2014  . Closed left hip fracture 06/25/2014  . Gastritis  02/13/2013  . Hyperlipidemia     Past Surgical History  Procedure Laterality Date  . Excisional hemorrhoidectomy  1980  . Bladder suspension  1993  . Back surgery  2003  . Abdominal hysterectomy    . Trigger finger release  2009    Dr Teressa Senter  . Intramedullary (im) nail intertrochanteric Right 01/27/2014    Procedure: INTRAMEDULLARY (IM) NAIL INTERTROCHANTRIC;  Surgeon: Sheral Apley, MD;  Location: [facility] OR;  Service: Orthopedics;  Laterality: Right;  . Intramedullary (im) nail intertrochanteric Left 06/26/2014    Procedure: LEFT HIP INTRAMEDULLARY (IM) NAIL;  Surgeon: Cheral Almas, MD;  Location: [facility] OR;  Service: Orthopedics;  Laterality: Left;    Allergies  Allergen Reactions  . Celebrex [Celecoxib]     unknown  . Chlorzoxazone     unknown   Medications: Patient's Medications  New Prescriptions   No medications on file  Previous Medications   AMLODIPINE (NORVASC) 5 MG TABLET    TAKE ONE TABLET BY MOUTH EVERY DAY   CALCIUM CARBONATE (TUMS - DOSED IN MG ELEMENTAL CALCIUM) 500 MG CHEWABLE TABLET    Chew 1 tablet by mouth daily as needed for indigestion or heartburn.   CALCIUM-VITAMIN D (OSCAL WITH D) 500-200 MG-UNIT PER TABLET    Take 1 tablet by mouth daily.   DICYCLOMINE (BENTYL) 10 MG CAPSULE    Take 10 mg by mouth at bedtime.   FEXOFENADINE (ALLEGRA) 180 MG TABLET    Take 180 mg by mouth daily as needed for allergies or rhinitis.   HYDROCORTISONE (ANUSOL-HC) 2.5 % RECTAL CREAM    Place 1 application rectally daily as needed.   LATANOPROST (XALATAN) 0.005 % OPHTHALMIC SOLUTION    Place 1 drop into both eyes at bedtime.   LEVOTHYROXINE (SYNTHROID, LEVOTHROID) 75 MCG TABLET    Take 1 tablet (75 mcg total) by mouth daily before breakfast. Take 1 tablet by mouth every other day   LORAZEPAM (ATIVAN) 0.5 MG TABLET    TAKE TWO TABLETS BY MOUTH AT BEDTIME TO HELP REST   LOVASTATIN (MEVACOR) 20 MG TABLET    TAKE ONE TABLET BY MOUTH EVERY EVENING   MULTIPLE VITAMINS-MINERALS (ICAPS  LUTEIN & OMEGA-3) CAPS    Take 1 capsule by mouth daily.   MYRBETRIQ 50 MG TB24 TABLET    TAKE ONE TABLET BY MOUTH EVERY DAY FOR BLADDER   POLYETHYLENE GLYCOL (MIRALAX / GLYCOLAX) PACKET    Take 17 g by mouth daily.   QUETIAPINE (SEROQUEL) 50 MG TABLET    Take 1 tablet (50 mg total) by mouth daily.   SERTRALINE (ZOLOFT) 100 MG TABLET    Take one tablet by mouth once daily   TRAMADOL (ULTRAM) 50 MG TABLET    Take 1 tablet (50 mg total) by mouth every 6 (six) hours as needed.   VITAMIN B-12 (CYANOCOBALAMIN) 100 MCG TABLET    Take 1 tablet (100 mcg total) by mouth  daily.  Modified Medications   Modified Medication Previous Medication   ALBUTEROL (PROVENTIL HFA;VENTOLIN HFA) 108 (90 BASE) MCG/ACT INHALER albuterol (PROVENTIL HFA;VENTOLIN HFA) 108 (90 BASE) MCG/ACT inhaler      Inhale 2 puffs into the lungs every 6 (six) hours as needed. Shortness of breath.    Inhale 2 puffs into the lungs every 6 (six) hours as needed. Shortness of breath.   HYDROCODONE-ACETAMINOPHEN (NORCO/VICODIN) 5-325 MG PER TABLET HYDROcodone-acetaminophen (NORCO/VICODIN) 5-325 MG per tablet      Take one tablet by mouth three times daily with meals and One tablet at bedtime for pain    Take one tablet by mouth three times daily with meals and One tablet at bedtime for pain   LEVOTHYROXINE (SYNTHROID, LEVOTHROID) 88 MCG TABLET levothyroxine (SYNTHROID, LEVOTHROID) 88 MCG tablet      TAKE ONE TABLET BY MOUTH 30 MINS BEFORE BREAKFAST FOR THYRIOD    Take 88 mcg by mouth daily before breakfast. Take 1 tablet by mouth every other day  Discontinued Medications   No medications on file    Physical Exam: Filed Vitals:   05/24/15 1508  BP: 122/52  Pulse: 108  Temp: 97.5 F (36.4 C)  TempSrc: Oral  Resp: 20  Height: 5\' 2"  (1.575 m)  Weight: 115 lb 6.4 oz (52.345 kg)  SpO2: 90%   Physical Exam  Constitutional: She appears well-nourished.  Thin white female using walker to ambulate  HENT:  Head: Normocephalic and  atraumatic.  Cardiovascular: Normal rate, regular rhythm, normal heart sounds and intact distal pulses.   Pulmonary/Chest: Effort normal and breath sounds normal.  Abdominal: Soft. Bowel sounds are normal. She exhibits no distension. There is no tenderness.  Musculoskeletal: Normal range of motion.  Neurological: She is alert.  Seems much clearer today; poor short term memory  Skin: Skin is warm and dry. There is pallor.  Psychiatric: She has a normal mood and affect.    Labs reviewed: Basic Metabolic Panel:  Recent Labs  2229 02/27/15 0145 04/08/15 0932 05/24/15 1611  NA 141 138  --  142  K 4.1 3.9  --  4.9  CL 106 107  --  102  CO2 28 27  --  27  GLUCOSE 101* 92  --  100*  BUN 21 19  --  23  CREATININE 0.76 0.69  --  0.76  CALCIUM 9.5 9.0  --  9.7  MG  --  2.0  --   --   PHOS  --  3.7  --   --   TSH  --  19.354* 0.366* 0.849   Liver Function Tests:  Recent Labs  06/28/14 0404  02/22/15 1420 02/26/15 2229 02/27/15 0145  AST 16  < > 17 17 17   ALT <5  < > 10 13 13   ALKPHOS 47  < > 70 70 62  BILITOT 0.5  < > 0.4 0.5 0.6  PROT 5.0*  < > 6.3 6.3 6.1  ALBUMIN 2.8*  --   --  4.4 4.1  < > = values in this interval not displayed. No results for input(s): LIPASE, AMYLASE in the last 8760 hours. No results for input(s): AMMONIA in the last 8760 hours. CBC:  Recent Labs  06/25/14 1145  09/15/14 1410 02/22/15 1420 02/26/15 2229 02/27/15 0145  WBC 12.3*  < > 5.8 6.5 5.2 5.0  NEUTROABS 11.0*  --  3.8 4.9  --   --   HGB 11.7*  < > 12.8 12.0 11.6* 11.9*  HCT 34.6*  < > 39.7 35.8 34.7* 35.6*  MCV 91.5  < > 93 92 93.5 94.4  PLT 170  < >  --  207 181 182  < > = values in this interval not displayed. Lipid Panel:  Recent Labs  04/08/15 0932  CHOL 148  HDL 50  LDLCALC 80  TRIG 90  CHOLHDL 3.0   Lab Results  Component Value Date   HGBA1C 5.5 02/27/2015    Procedures since last visit: ED records reviewed... 05/20/15:   -Lumbar spine complete:   Multilevel osteoarthritic change, essentially stable. No fracture. Stable mild spondylolisthesis of L4-5. Stable scoliosis. Atherosclerotic change noted -Left shoulder xrays:  No acute abnormality noted. -Left hip with pelvis xrays:  Status post ORIF of the bilateral hips. No evidence of acute fracture or dislocation. -Left foot xrays:  No fracture or dislocation apparent. Soft tissue swelling over the dorsum of the foot. Narrowing of multiple distal joints. Bones diffusely osteoporotic.   Assessment/Plan 1. Chronic obstructive airway disease with asthma - this is stable, does not need her inhaler more than 1x per week, but needs renewal for it - albuterol (PROVENTIL HFA;VENTOLIN HFA) 108 (90 BASE) MCG/ACT inhaler; Inhale 2 puffs into the lungs every 6 (six) hours as needed. Shortness of breath.  Dispense: 3 Inhaler; Refill: 1  2. Hypothyroidism due to acquired atrophy of thyroid -is on alternating doses of and daily at this time -f/u TSH and if normal--cont same dose  3. Vascular dementia with depression -doing pretty well at present, but is having more difficulty with her gait and falling -importance of ALWAYS using walker was emphasized -no recent difficulty with delusions or hallucinations, but stable with the seroquel  4. Edema -worse more recently where she lives -I suspect she's getting too much salt--we spent about 15 mins of the visit discussing dietary changes to prevent edema and elevating feet at rest -if remains problematic, would then resume compression hose (did wear these at one time) - Basic metabolic panel  5. Decreased hearing of left ear -did not have significant cerumen present so unclear why worse now, but has not be sudden--her daughter is going to check the battery situation  6. History of falling -ongoing difficulty with more falls lately and unremarkable labs -her dementia is progressing gradually and likely the cause -has not tolerated  stopping seroquel once before (hallucinations returned), is also on hydrocodone and tramadol for pain but needs these sometimes for her back/buttock primarily  7. Bruised toe, left, sequela -nonpainful; seems this occurred in one of the earlier falls, but no fracture was identified, it's getting better   Labs/tests ordered:   Orders Placed This Encounter  Procedures  . TSH  . Basic metabolic panel    Next appt:  3 mos for med mgt  Jaquis Picklesimer L. Aiko Belko, D.O. Geriatrics [facility] [address] [place], [place] [address] Cell Phone (Mon-Fri 8am-5pm):  [phone] On Call:  [phone] & follow prompts after 5pm & weekends Office Phone:  [phone] Office Fax:  [phone]

## 2015-05-25 DIAGNOSIS — I10 Essential (primary) hypertension: Secondary | ICD-10-CM | POA: Diagnosis not present

## 2015-05-25 DIAGNOSIS — Z9181 History of falling: Secondary | ICD-10-CM | POA: Diagnosis not present

## 2015-05-25 DIAGNOSIS — M545 Low back pain: Secondary | ICD-10-CM | POA: Diagnosis not present

## 2015-05-25 DIAGNOSIS — J438 Other emphysema: Secondary | ICD-10-CM | POA: Diagnosis not present

## 2015-05-25 DIAGNOSIS — Z87891 Personal history of nicotine dependence: Secondary | ICD-10-CM | POA: Diagnosis not present

## 2015-05-25 DIAGNOSIS — M810 Age-related osteoporosis without current pathological fracture: Secondary | ICD-10-CM | POA: Diagnosis not present

## 2015-05-25 DIAGNOSIS — G250 Essential tremor: Secondary | ICD-10-CM | POA: Diagnosis not present

## 2015-05-25 DIAGNOSIS — F419 Anxiety disorder, unspecified: Secondary | ICD-10-CM | POA: Diagnosis not present

## 2015-05-25 DIAGNOSIS — M1990 Unspecified osteoarthritis, unspecified site: Secondary | ICD-10-CM | POA: Diagnosis not present

## 2015-05-25 LAB — BASIC METABOLIC PANEL
BUN/Creatinine Ratio: 30 — ABNORMAL HIGH (ref 11–26)
BUN: 23 mg/dL (ref 8–27)
CO2: 27 mmol/L (ref 18–29)
Calcium: 9.7 mg/dL (ref 8.7–10.3)
Chloride: 102 mmol/L (ref 97–108)
Creatinine, Ser: 0.76 mg/dL (ref 0.57–1.00)
GFR calc Af Amer: 82 mL/min/{1.73_m2} (ref >59)
GFR calc non Af Amer: 71 mL/min/{1.73_m2} (ref >59)
Glucose: 100 mg/dL — ABNORMAL HIGH (ref 65–99)
Potassium: 4.9 mmol/L (ref 3.5–5.2)
Sodium: 142 mmol/L (ref 134–144)

## 2015-05-25 LAB — TSH: TSH: 0.849 u[IU]/mL (ref 0.450–4.500)

## 2015-05-27 ENCOUNTER — Other Ambulatory Visit: Payer: Self-pay | Admitting: Internal Medicine

## 2015-05-28 DIAGNOSIS — G250 Essential tremor: Secondary | ICD-10-CM | POA: Diagnosis not present

## 2015-05-28 DIAGNOSIS — M1990 Unspecified osteoarthritis, unspecified site: Secondary | ICD-10-CM | POA: Diagnosis not present

## 2015-05-28 DIAGNOSIS — M545 Low back pain: Secondary | ICD-10-CM | POA: Diagnosis not present

## 2015-05-28 DIAGNOSIS — J438 Other emphysema: Secondary | ICD-10-CM | POA: Diagnosis not present

## 2015-05-28 DIAGNOSIS — I10 Essential (primary) hypertension: Secondary | ICD-10-CM | POA: Diagnosis not present

## 2015-05-28 DIAGNOSIS — M810 Age-related osteoporosis without current pathological fracture: Secondary | ICD-10-CM | POA: Diagnosis not present

## 2015-06-01 DIAGNOSIS — G250 Essential tremor: Secondary | ICD-10-CM | POA: Diagnosis not present

## 2015-06-01 DIAGNOSIS — M545 Low back pain: Secondary | ICD-10-CM | POA: Diagnosis not present

## 2015-06-01 DIAGNOSIS — M810 Age-related osteoporosis without current pathological fracture: Secondary | ICD-10-CM | POA: Diagnosis not present

## 2015-06-01 DIAGNOSIS — J438 Other emphysema: Secondary | ICD-10-CM | POA: Diagnosis not present

## 2015-06-01 DIAGNOSIS — I10 Essential (primary) hypertension: Secondary | ICD-10-CM | POA: Diagnosis not present

## 2015-06-01 DIAGNOSIS — M1990 Unspecified osteoarthritis, unspecified site: Secondary | ICD-10-CM | POA: Diagnosis not present

## 2015-06-03 DIAGNOSIS — M545 Low back pain: Secondary | ICD-10-CM | POA: Diagnosis not present

## 2015-06-03 DIAGNOSIS — I10 Essential (primary) hypertension: Secondary | ICD-10-CM | POA: Diagnosis not present

## 2015-06-03 DIAGNOSIS — M810 Age-related osteoporosis without current pathological fracture: Secondary | ICD-10-CM | POA: Diagnosis not present

## 2015-06-03 DIAGNOSIS — M1990 Unspecified osteoarthritis, unspecified site: Secondary | ICD-10-CM | POA: Diagnosis not present

## 2015-06-03 DIAGNOSIS — G250 Essential tremor: Secondary | ICD-10-CM | POA: Diagnosis not present

## 2015-06-03 DIAGNOSIS — J438 Other emphysema: Secondary | ICD-10-CM | POA: Diagnosis not present

## 2015-06-08 DIAGNOSIS — M545 Low back pain: Secondary | ICD-10-CM | POA: Diagnosis not present

## 2015-06-08 DIAGNOSIS — M1990 Unspecified osteoarthritis, unspecified site: Secondary | ICD-10-CM | POA: Diagnosis not present

## 2015-06-08 DIAGNOSIS — J438 Other emphysema: Secondary | ICD-10-CM | POA: Diagnosis not present

## 2015-06-08 DIAGNOSIS — G250 Essential tremor: Secondary | ICD-10-CM | POA: Diagnosis not present

## 2015-06-08 DIAGNOSIS — M810 Age-related osteoporosis without current pathological fracture: Secondary | ICD-10-CM | POA: Diagnosis not present

## 2015-06-08 DIAGNOSIS — I10 Essential (primary) hypertension: Secondary | ICD-10-CM | POA: Diagnosis not present

## 2015-06-10 DIAGNOSIS — G250 Essential tremor: Secondary | ICD-10-CM | POA: Diagnosis not present

## 2015-06-10 DIAGNOSIS — I10 Essential (primary) hypertension: Secondary | ICD-10-CM | POA: Diagnosis not present

## 2015-06-10 DIAGNOSIS — J438 Other emphysema: Secondary | ICD-10-CM | POA: Diagnosis not present

## 2015-06-10 DIAGNOSIS — M545 Low back pain: Secondary | ICD-10-CM | POA: Diagnosis not present

## 2015-06-10 DIAGNOSIS — M810 Age-related osteoporosis without current pathological fracture: Secondary | ICD-10-CM | POA: Diagnosis not present

## 2015-06-10 DIAGNOSIS — M1990 Unspecified osteoarthritis, unspecified site: Secondary | ICD-10-CM | POA: Diagnosis not present

## 2015-06-14 DIAGNOSIS — I10 Essential (primary) hypertension: Secondary | ICD-10-CM | POA: Diagnosis not present

## 2015-06-14 DIAGNOSIS — M1990 Unspecified osteoarthritis, unspecified site: Secondary | ICD-10-CM | POA: Diagnosis not present

## 2015-06-14 DIAGNOSIS — J438 Other emphysema: Secondary | ICD-10-CM | POA: Diagnosis not present

## 2015-06-14 DIAGNOSIS — M810 Age-related osteoporosis without current pathological fracture: Secondary | ICD-10-CM | POA: Diagnosis not present

## 2015-06-14 DIAGNOSIS — G250 Essential tremor: Secondary | ICD-10-CM | POA: Diagnosis not present

## 2015-06-14 DIAGNOSIS — M545 Low back pain: Secondary | ICD-10-CM | POA: Diagnosis not present

## 2015-06-15 ENCOUNTER — Emergency Department (HOSPITAL_COMMUNITY): Payer: Medicare Other

## 2015-06-15 ENCOUNTER — Encounter (HOSPITAL_COMMUNITY): Payer: Self-pay | Admitting: Emergency Medicine

## 2015-06-15 ENCOUNTER — Inpatient Hospital Stay (HOSPITAL_COMMUNITY)
Admission: EM | Admit: 2015-06-15 | Discharge: 2015-06-20 | DRG: 562 | Disposition: A | Discharge status: Skilled Nursing Facility | Payer: Medicare Other | Attending: Internal Medicine | Admitting: Internal Medicine

## 2015-06-15 DIAGNOSIS — F0391 Unspecified dementia with behavioral disturbance: Secondary | ICD-10-CM | POA: Diagnosis not present

## 2015-06-15 DIAGNOSIS — M79602 Pain in left arm: Secondary | ICD-10-CM | POA: Diagnosis not present

## 2015-06-15 DIAGNOSIS — R52 Pain, unspecified: Secondary | ICD-10-CM | POA: Diagnosis not present

## 2015-06-15 DIAGNOSIS — S42332A Displaced oblique fracture of shaft of humerus, left arm, initial encounter for closed fracture: Secondary | ICD-10-CM | POA: Diagnosis not present

## 2015-06-15 DIAGNOSIS — S42309A Unspecified fracture of shaft of humerus, unspecified arm, initial encounter for closed fracture: Secondary | ICD-10-CM | POA: Diagnosis present

## 2015-06-15 DIAGNOSIS — E876 Hypokalemia: Secondary | ICD-10-CM | POA: Diagnosis present

## 2015-06-15 DIAGNOSIS — T50905A Adverse effect of unspecified drugs, medicaments and biological substances, initial encounter: Secondary | ICD-10-CM | POA: Diagnosis present

## 2015-06-15 DIAGNOSIS — S0990XA Unspecified injury of head, initial encounter: Secondary | ICD-10-CM | POA: Diagnosis not present

## 2015-06-15 DIAGNOSIS — M25571 Pain in right ankle and joints of right foot: Secondary | ICD-10-CM | POA: Diagnosis not present

## 2015-06-15 DIAGNOSIS — R278 Other lack of coordination: Secondary | ICD-10-CM | POA: Diagnosis not present

## 2015-06-15 DIAGNOSIS — S42202A Unspecified fracture of upper end of left humerus, initial encounter for closed fracture: Principal | ICD-10-CM | POA: Diagnosis present

## 2015-06-15 DIAGNOSIS — Z66 Do not resuscitate: Secondary | ICD-10-CM | POA: Diagnosis present

## 2015-06-15 DIAGNOSIS — Z6822 Body mass index (BMI) 22.0-22.9, adult: Secondary | ICD-10-CM | POA: Diagnosis not present

## 2015-06-15 DIAGNOSIS — Y92090 Kitchen in other non-institutional residence as the place of occurrence of the external cause: Secondary | ICD-10-CM

## 2015-06-15 DIAGNOSIS — H3530 Unspecified macular degeneration: Secondary | ICD-10-CM | POA: Diagnosis present

## 2015-06-15 DIAGNOSIS — E784 Other hyperlipidemia: Secondary | ICD-10-CM | POA: Diagnosis not present

## 2015-06-15 DIAGNOSIS — F329 Major depressive disorder, single episode, unspecified: Secondary | ICD-10-CM | POA: Diagnosis present

## 2015-06-15 DIAGNOSIS — S023XXA Fracture of orbital floor, initial encounter for closed fracture: Secondary | ICD-10-CM | POA: Diagnosis present

## 2015-06-15 DIAGNOSIS — G8929 Other chronic pain: Secondary | ICD-10-CM | POA: Diagnosis not present

## 2015-06-15 DIAGNOSIS — S99911A Unspecified injury of right ankle, initial encounter: Secondary | ICD-10-CM | POA: Diagnosis not present

## 2015-06-15 DIAGNOSIS — E039 Hypothyroidism, unspecified: Secondary | ICD-10-CM | POA: Diagnosis present

## 2015-06-15 DIAGNOSIS — W010XXA Fall on same level from slipping, tripping and stumbling without subsequent striking against object, initial encounter: Secondary | ICD-10-CM | POA: Diagnosis present

## 2015-06-15 DIAGNOSIS — Z87891 Personal history of nicotine dependence: Secondary | ICD-10-CM

## 2015-06-15 DIAGNOSIS — D519 Vitamin B12 deficiency anemia, unspecified: Secondary | ICD-10-CM | POA: Diagnosis present

## 2015-06-15 DIAGNOSIS — M1990 Unspecified osteoarthritis, unspecified site: Secondary | ICD-10-CM | POA: Diagnosis present

## 2015-06-15 DIAGNOSIS — E43 Unspecified severe protein-calorie malnutrition: Secondary | ICD-10-CM | POA: Diagnosis present

## 2015-06-15 DIAGNOSIS — G4700 Insomnia, unspecified: Secondary | ICD-10-CM | POA: Diagnosis present

## 2015-06-15 DIAGNOSIS — Z8249 Family history of ischemic heart disease and other diseases of the circulatory system: Secondary | ICD-10-CM | POA: Diagnosis not present

## 2015-06-15 DIAGNOSIS — F0390 Unspecified dementia without behavioral disturbance: Secondary | ICD-10-CM | POA: Diagnosis not present

## 2015-06-15 DIAGNOSIS — W19XXXA Unspecified fall, initial encounter: Secondary | ICD-10-CM | POA: Diagnosis not present

## 2015-06-15 DIAGNOSIS — I10 Essential (primary) hypertension: Secondary | ICD-10-CM | POA: Diagnosis not present

## 2015-06-15 DIAGNOSIS — R1312 Dysphagia, oropharyngeal phase: Secondary | ICD-10-CM | POA: Diagnosis not present

## 2015-06-15 DIAGNOSIS — N3281 Overactive bladder: Secondary | ICD-10-CM | POA: Diagnosis not present

## 2015-06-15 DIAGNOSIS — Z86711 Personal history of pulmonary embolism: Secondary | ICD-10-CM

## 2015-06-15 DIAGNOSIS — R102 Pelvic and perineal pain: Secondary | ICD-10-CM | POA: Diagnosis not present

## 2015-06-15 DIAGNOSIS — E785 Hyperlipidemia, unspecified: Secondary | ICD-10-CM | POA: Diagnosis present

## 2015-06-15 DIAGNOSIS — M6281 Muscle weakness (generalized): Secondary | ICD-10-CM | POA: Diagnosis not present

## 2015-06-15 DIAGNOSIS — R0602 Shortness of breath: Secondary | ICD-10-CM | POA: Diagnosis not present

## 2015-06-15 DIAGNOSIS — E038 Other specified hypothyroidism: Secondary | ICD-10-CM | POA: Diagnosis not present

## 2015-06-15 DIAGNOSIS — S02401A Maxillary fracture, unspecified, initial encounter for closed fracture: Secondary | ICD-10-CM | POA: Diagnosis present

## 2015-06-15 DIAGNOSIS — J310 Chronic rhinitis: Secondary | ICD-10-CM | POA: Diagnosis not present

## 2015-06-15 DIAGNOSIS — S199XXA Unspecified injury of neck, initial encounter: Secondary | ICD-10-CM | POA: Diagnosis not present

## 2015-06-15 DIAGNOSIS — K30 Functional dyspepsia: Secondary | ICD-10-CM | POA: Diagnosis not present

## 2015-06-15 DIAGNOSIS — J439 Emphysema, unspecified: Secondary | ICD-10-CM | POA: Diagnosis not present

## 2015-06-15 DIAGNOSIS — Y92009 Unspecified place in unspecified non-institutional (private) residence as the place of occurrence of the external cause: Secondary | ICD-10-CM

## 2015-06-15 DIAGNOSIS — S02401D Maxillary fracture, unspecified, subsequent encounter for fracture with routine healing: Secondary | ICD-10-CM | POA: Diagnosis not present

## 2015-06-15 DIAGNOSIS — S3993XA Unspecified injury of pelvis, initial encounter: Secondary | ICD-10-CM | POA: Diagnosis not present

## 2015-06-15 DIAGNOSIS — K5900 Constipation, unspecified: Secondary | ICD-10-CM | POA: Diagnosis not present

## 2015-06-15 DIAGNOSIS — R41841 Cognitive communication deficit: Secondary | ICD-10-CM | POA: Diagnosis not present

## 2015-06-15 DIAGNOSIS — E538 Deficiency of other specified B group vitamins: Secondary | ICD-10-CM | POA: Diagnosis present

## 2015-06-15 DIAGNOSIS — F419 Anxiety disorder, unspecified: Secondary | ICD-10-CM | POA: Diagnosis not present

## 2015-06-15 DIAGNOSIS — S0232XA Fracture of orbital floor, left side, initial encounter for closed fracture: Secondary | ICD-10-CM | POA: Diagnosis present

## 2015-06-15 DIAGNOSIS — R262 Difficulty in walking, not elsewhere classified: Secondary | ICD-10-CM | POA: Diagnosis not present

## 2015-06-15 DIAGNOSIS — S42302A Unspecified fracture of shaft of humerus, left arm, initial encounter for closed fracture: Secondary | ICD-10-CM | POA: Diagnosis not present

## 2015-06-15 DIAGNOSIS — D518 Other vitamin B12 deficiency anemias: Secondary | ICD-10-CM | POA: Diagnosis not present

## 2015-06-15 DIAGNOSIS — S42202D Unspecified fracture of upper end of left humerus, subsequent encounter for fracture with routine healing: Secondary | ICD-10-CM | POA: Diagnosis not present

## 2015-06-15 DIAGNOSIS — W19XXXD Unspecified fall, subsequent encounter: Secondary | ICD-10-CM | POA: Diagnosis not present

## 2015-06-15 DIAGNOSIS — K589 Irritable bowel syndrome without diarrhea: Secondary | ICD-10-CM | POA: Diagnosis not present

## 2015-06-15 HISTORY — DX: Deficiency of other specified B group vitamins: E53.8

## 2015-06-15 HISTORY — DX: Unspecified fracture of shaft of humerus, unspecified arm, initial encounter for closed fracture: S42.309A

## 2015-06-15 HISTORY — DX: Maxillary fracture, unspecified side, initial encounter for closed fracture: S02.401A

## 2015-06-15 HISTORY — DX: Fracture of orbital floor, left side, initial encounter for closed fracture: S02.32XA

## 2015-06-15 LAB — CBC WITH DIFFERENTIAL/PLATELET
Basophils Absolute: 0 10*3/uL (ref 0.0–0.1)
Basophils Relative: 0 % (ref 0–1)
Eosinophils Absolute: 0 10*3/uL (ref 0.0–0.7)
Eosinophils Relative: 0 % (ref 0–5)
HCT: 34.4 % — ABNORMAL LOW (ref 36.0–46.0)
Hemoglobin: 11.2 g/dL — ABNORMAL LOW (ref 12.0–15.0)
LYMPHS ABS: 0.9 10*3/uL (ref 0.7–4.0)
Lymphocytes Relative: 8 % — ABNORMAL LOW (ref 12–46)
MCH: 30.3 pg (ref 26.0–34.0)
MCHC: 32.6 g/dL (ref 30.0–36.0)
MCV: 93 fL (ref 78.0–100.0)
Monocytes Absolute: 0.7 10*3/uL (ref 0.1–1.0)
Monocytes Relative: 6 % (ref 3–12)
NEUTROS PCT: 86 % — AB (ref 43–77)
Neutro Abs: 10.6 10*3/uL — ABNORMAL HIGH (ref 1.7–7.7)
PLATELETS: 155 10*3/uL (ref 150–400)
RBC: 3.7 MIL/uL — AB (ref 3.87–5.11)
RDW: 12.7 % (ref 11.5–15.5)
WBC: 12.3 10*3/uL — AB (ref 4.0–10.5)

## 2015-06-15 MED ORDER — FENTANYL CITRATE (PF) 100 MCG/2ML IJ SOLN
100.0000 ug | Freq: Once | INTRAMUSCULAR | Status: AC
  Administered 2015-06-15: 100 ug via INTRAVENOUS
  Filled 2015-06-15: qty 2

## 2015-06-15 NOTE — ED Notes (Signed)
Pt is from [facility] at home independent living. Found in floor. Unknown downtime. Hematoma on L eye. L arm deformity. Fentanyl given en route. Dementia.

## 2015-06-15 NOTE — ED Notes (Signed)
MD at bedside. 

## 2015-06-15 NOTE — ED Notes (Signed)
Patient transported to X-ray 

## 2015-06-16 ENCOUNTER — Encounter (HOSPITAL_COMMUNITY): Payer: Self-pay

## 2015-06-16 ENCOUNTER — Inpatient Hospital Stay (HOSPITAL_COMMUNITY): Payer: Medicare Other

## 2015-06-16 ENCOUNTER — Emergency Department (HOSPITAL_COMMUNITY): Payer: Medicare Other

## 2015-06-16 DIAGNOSIS — Y92009 Unspecified place in unspecified non-institutional (private) residence as the place of occurrence of the external cause: Secondary | ICD-10-CM

## 2015-06-16 DIAGNOSIS — Y92099 Unspecified place in other non-institutional residence as the place of occurrence of the external cause: Secondary | ICD-10-CM

## 2015-06-16 DIAGNOSIS — W19XXXA Unspecified fall, initial encounter: Secondary | ICD-10-CM | POA: Diagnosis not present

## 2015-06-16 DIAGNOSIS — S99911A Unspecified injury of right ankle, initial encounter: Secondary | ICD-10-CM | POA: Diagnosis not present

## 2015-06-16 DIAGNOSIS — M25571 Pain in right ankle and joints of right foot: Secondary | ICD-10-CM | POA: Diagnosis not present

## 2015-06-16 DIAGNOSIS — S42309A Unspecified fracture of shaft of humerus, unspecified arm, initial encounter for closed fracture: Secondary | ICD-10-CM | POA: Diagnosis present

## 2015-06-16 DIAGNOSIS — S42302A Unspecified fracture of shaft of humerus, left arm, initial encounter for closed fracture: Secondary | ICD-10-CM | POA: Diagnosis not present

## 2015-06-16 DIAGNOSIS — F0390 Unspecified dementia without behavioral disturbance: Secondary | ICD-10-CM | POA: Diagnosis present

## 2015-06-16 DIAGNOSIS — J984 Other disorders of lung: Secondary | ICD-10-CM | POA: Diagnosis not present

## 2015-06-16 LAB — BASIC METABOLIC PANEL
ANION GAP: 9 (ref 5–15)
BUN: 23 mg/dL — ABNORMAL HIGH (ref 6–20)
CHLORIDE: 106 mmol/L (ref 101–111)
CO2: 24 mmol/L (ref 22–32)
Calcium: 9 mg/dL (ref 8.9–10.3)
Creatinine, Ser: 0.68 mg/dL (ref 0.44–1.00)
GFR calc Af Amer: >60 mL/min (ref >60)
GFR calc non Af Amer: >60 mL/min (ref >60)
Glucose, Bld: 138 mg/dL — ABNORMAL HIGH (ref 65–99)
POTASSIUM: 3.7 mmol/L (ref 3.5–5.1)
Sodium: 139 mmol/L (ref 135–145)

## 2015-06-16 LAB — CBC
HCT: 30.3 % — ABNORMAL LOW (ref 36.0–46.0)
Hemoglobin: 10 g/dL — ABNORMAL LOW (ref 12.0–15.0)
MCH: 31.2 pg (ref 26.0–34.0)
MCHC: 33 g/dL (ref 30.0–36.0)
MCV: 94.4 fL (ref 78.0–100.0)
PLATELETS: 189 10*3/uL (ref 150–400)
RBC: 3.21 MIL/uL — ABNORMAL LOW (ref 3.87–5.11)
RDW: 13 % (ref 11.5–15.5)
WBC: 8.8 10*3/uL (ref 4.0–10.5)

## 2015-06-16 LAB — CREATININE, SERUM
Creatinine, Ser: 0.83 mg/dL (ref 0.44–1.00)
GFR calc non Af Amer: >60 mL/min (ref >60)

## 2015-06-16 LAB — TSH: TSH: 0.329 u[IU]/mL — AB (ref 0.350–4.500)

## 2015-06-16 MED ORDER — AMLODIPINE BESYLATE 5 MG PO TABS
5.0000 mg | ORAL_TABLET | Freq: Every day | ORAL | Status: DC
  Administered 2015-06-16 – 2015-06-20 (×5): 5 mg via ORAL
  Filled 2015-06-16 (×5): qty 1

## 2015-06-16 MED ORDER — ONDANSETRON HCL 4 MG/2ML IJ SOLN: 4.0000 mg | Freq: Four times a day (QID) | INTRAMUSCULAR | Status: DC | PRN

## 2015-06-16 MED ORDER — QUETIAPINE FUMARATE 50 MG PO TABS
50.0000 mg | ORAL_TABLET | Freq: Every day | ORAL | Status: DC
  Administered 2015-06-16 – 2015-06-20 (×5): 50 mg via ORAL
  Filled 2015-06-16 (×5): qty 1

## 2015-06-16 MED ORDER — SODIUM CHLORIDE 0.9 % IJ SOLN
3.0000 mL | Freq: Two times a day (BID) | INTRAMUSCULAR | Status: DC
  Administered 2015-06-17 – 2015-06-20 (×3): 3 mL via INTRAVENOUS

## 2015-06-16 MED ORDER — POLYETHYLENE GLYCOL 3350 17 G PO PACK
17.0000 g | PACK | Freq: Every day | ORAL | Status: DC
  Administered 2015-06-16 – 2015-06-19 (×4): 17 g via ORAL
  Filled 2015-06-16 (×6): qty 1

## 2015-06-16 MED ORDER — CEFAZOLIN SODIUM 1-5 GM-% IV SOLN
1.0000 g | Freq: Once | INTRAVENOUS | Status: AC
  Administered 2015-06-16: 1 g via INTRAVENOUS
  Filled 2015-06-16: qty 50

## 2015-06-16 MED ORDER — CALCIUM CARBONATE-VITAMIN D 500-200 MG-UNIT PO TABS
1.0000 | ORAL_TABLET | Freq: Every day | ORAL | Status: DC
  Administered 2015-06-16 – 2015-06-20 (×4): 1 via ORAL
  Filled 2015-06-16 (×5): qty 1

## 2015-06-16 MED ORDER — ALUM & MAG HYDROXIDE-SIMETH 200-200-20 MG/5ML PO SUSP
30.0000 mL | Freq: Four times a day (QID) | ORAL | Status: DC | PRN
Start: 2015-06-16 — End: 2015-06-20

## 2015-06-16 MED ORDER — LEVOTHYROXINE SODIUM 88 MCG PO TABS
88.0000 ug | ORAL_TABLET | ORAL | Status: DC
  Administered 2015-06-17 – 2015-06-19 (×2): 88 ug via ORAL
  Filled 2015-06-16 (×2): qty 1

## 2015-06-16 MED ORDER — ALBUTEROL SULFATE (2.5 MG/3ML) 0.083% IN NEBU: 2.5000 mg | INHALATION_SOLUTION | Freq: Four times a day (QID) | RESPIRATORY_TRACT | Status: DC | PRN

## 2015-06-16 MED ORDER — FENTANYL CITRATE (PF) 100 MCG/2ML IJ SOLN
100.0000 ug | INTRAMUSCULAR | Status: DC | PRN
  Administered 2015-06-16 (×2): 100 ug via INTRAVENOUS
  Filled 2015-06-16 (×2): qty 2

## 2015-06-16 MED ORDER — HYDROCODONE-ACETAMINOPHEN 5-325 MG PO TABS
1.0000 | ORAL_TABLET | ORAL | Status: DC | PRN
  Administered 2015-06-16 – 2015-06-19 (×7): 1 via ORAL
  Filled 2015-06-16 (×7): qty 1

## 2015-06-16 MED ORDER — OCUVITE-LUTEIN PO CAPS
1.0000 | ORAL_CAPSULE | Freq: Every day | ORAL | Status: DC
  Administered 2015-06-17 – 2015-06-20 (×3): 1 via ORAL
  Filled 2015-06-16 (×5): qty 1

## 2015-06-16 MED ORDER — BISACODYL 10 MG RE SUPP: 10.0000 mg | Freq: Every day | RECTAL | Status: DC | PRN

## 2015-06-16 MED ORDER — ENOXAPARIN SODIUM 40 MG/0.4ML ~~LOC~~ SOLN
40.0000 mg | SUBCUTANEOUS | Status: DC
  Administered 2015-06-16 – 2015-06-19 (×4): 40 mg via SUBCUTANEOUS
  Filled 2015-06-16 (×5): qty 0.4

## 2015-06-16 MED ORDER — ONDANSETRON HCL 4 MG PO TABS: 4.0000 mg | ORAL_TABLET | Freq: Four times a day (QID) | ORAL | Status: DC | PRN

## 2015-06-16 MED ORDER — SERTRALINE HCL 50 MG PO TABS
50.0000 mg | ORAL_TABLET | Freq: Every day | ORAL | Status: DC
  Administered 2015-06-16 – 2015-06-20 (×5): 50 mg via ORAL
  Filled 2015-06-16 (×5): qty 1

## 2015-06-16 MED ORDER — FENTANYL CITRATE (PF) 100 MCG/2ML IJ SOLN
INTRAMUSCULAR | Status: AC
  Administered 2015-06-16: 02:00:00
  Filled 2015-06-16: qty 2

## 2015-06-16 MED ORDER — SODIUM CHLORIDE 0.9 % IV SOLN
INTRAVENOUS | Status: DC
  Administered 2015-06-16 – 2015-06-19 (×3): via INTRAVENOUS

## 2015-06-16 MED ORDER — ACETAMINOPHEN 325 MG PO TABS: 650.0000 mg | ORAL_TABLET | Freq: Four times a day (QID) | ORAL | Status: DC | PRN

## 2015-06-16 MED ORDER — CALCIUM CARBONATE ANTACID 500 MG PO CHEW: 1.0000 | CHEWABLE_TABLET | Freq: Every day | ORAL | Status: DC | PRN

## 2015-06-16 MED ORDER — ACETAMINOPHEN 650 MG RE SUPP: 650.0000 mg | Freq: Four times a day (QID) | RECTAL | Status: DC | PRN

## 2015-06-16 MED ORDER — LEVOTHYROXINE SODIUM 75 MCG PO TABS
75.0000 ug | ORAL_TABLET | ORAL | Status: DC
  Administered 2015-06-18 – 2015-06-20 (×2): 75 ug via ORAL
  Filled 2015-06-16 (×2): qty 1

## 2015-06-16 MED ORDER — LEVOTHYROXINE SODIUM 88 MCG PO TABS
88.0000 ug | ORAL_TABLET | Freq: Every day | ORAL | Status: DC
  Filled 2015-06-16 (×2): qty 1

## 2015-06-16 MED ORDER — ICAPS LUTEIN & OMEGA-3 PO CAPS: 1.0000 | ORAL_CAPSULE | Freq: Every day | ORAL | Status: DC

## 2015-06-16 MED ORDER — MIRABEGRON ER 50 MG PO TB24
50.0000 mg | ORAL_TABLET | Freq: Every day | ORAL | Status: DC
  Administered 2015-06-16 – 2015-06-20 (×5): 50 mg via ORAL
  Filled 2015-06-16 (×5): qty 1

## 2015-06-16 MED ORDER — LEVOTHYROXINE SODIUM 75 MCG PO TABS
75.0000 ug | ORAL_TABLET | Freq: Every day | ORAL | Status: DC
  Filled 2015-06-16: qty 1

## 2015-06-16 MED ORDER — LATANOPROST 0.005 % OP SOLN
1.0000 [drp] | Freq: Every day | OPHTHALMIC | Status: DC
  Administered 2015-06-16 – 2015-06-19 (×4): 1 [drp] via OPHTHALMIC
  Filled 2015-06-16: qty 2.5

## 2015-06-16 MED ORDER — PRAVASTATIN SODIUM 20 MG PO TABS
20.0000 mg | ORAL_TABLET | Freq: Every day | ORAL | Status: DC
  Administered 2015-06-16 – 2015-06-19 (×4): 20 mg via ORAL
  Filled 2015-06-16 (×5): qty 1

## 2015-06-16 NOTE — Progress Notes (Signed)
PATIENT HAS MACULAR DEGENERATION AND NEEDS HELP ORDERING MEALS

## 2015-06-16 NOTE — ED Notes (Signed)
Gave patient a few sips of water

## 2015-06-16 NOTE — ED Notes (Signed)
MD at bedside. Unable to transport pt at this time.

## 2015-06-16 NOTE — Evaluation (Signed)
Physical Therapy Evaluation Patient Details Name: Nikeya [name] MRN: [id number] DOB: 1925/07/07 Today's Date: 06/16/2015   History of Present Illness  81 yo female admitted with L prox humeral fx after fall at home. Hx of dementia, multiple falls, bil hip IM nail in 2015. Pt is from [facility] Living.   Clinical Impression  On eval, pt required Mod assist for mobility-pt stood and took a few side steps along bed with 1 HHA. Max encouragement for participation. Applied sling while in bed with much encouragement from therapist, RN. Pt is very unsteady and remains at risk for falls. Do not feel pt will be able to safely manage at home alone. Pt c/o L hip pain with standing.     Follow Up Recommendations SNF;Supervision/Assistance - 24 hour    Equipment Recommendations   (may need wheelchair)    Recommendations for Other Services OT consult     Precautions / Restrictions Precautions Precautions: Fall;Shoulder Shoulder Interventions: Shoulder sling/immobilizer Restrictions Weight Bearing Restrictions: Yes LUE Weight Bearing: Non weight bearing      Mobility  Bed Mobility Overal bed mobility: Needs Assistance Bed Mobility: Supine to Sit;Sit to Supine     Supine to sit: HOB elevated;Min assist Sit to supine: Mod assist   General bed mobility comments: Assist to scoot fully to EOB. Increased time. Assist to support trunk for sit to supine  Transfers Overall transfer level: Needs assistance Equipment used: 1 person hand held assist Transfers: Sit to/from Stand Sit to Stand: Mod assist;From elevated surface         General transfer comment: Assist to rise, stabilize, control descent. Multimodal cues for safety, technique, hand placement. Max cueing for safety. Pt resistant to cues/suggestions. Side steps toward [facility] with Mod assist.   Ambulation/Gait             General Gait Details: Unable to ambulate.   Stairs            Wheelchair Mobility    Modified Rankin  (Stroke Patients Only)       Balance Overall balance assessment: History of Falls;Needs assistance         Standing balance support: During functional activity Standing balance-Leahy Scale: Poor                               Pertinent Vitals/Pain Pain Assessment: Faces Faces Pain Scale: Hurts even more Pain Location: L UE with mobility Pain Intervention(s): Limited activity within patient's tolerance;Repositioned    Home Living       Type of Home: Independent living facility         Home Equipment: Dan Humphreys - 2 wheels;Shower seat      Prior Function Level of Independence: Independent with assistive device(s)               Hand Dominance        Extremity/Trunk Assessment   Upper Extremity Assessment: LUE deficits/detail       LUE Deficits / Details: splint in place. Noted pt did not have sling for OOB/mobility so checked with RN and had sling broght to room. Applied in bed.   Lower Extremity Assessment: Generalized weakness      Cervical / Trunk Assessment: Kyphotic  Communication   Communication: HOH  Cognition Arousal/Alertness: Awake/alert Behavior During Therapy: Anxious Overall Cognitive Status: History of cognitive impairments - at baseline  General Comments      Exercises        Assessment/Plan    PT Assessment Patient needs continued PT services  PT Diagnosis Difficulty walking;Acute pain;Generalized weakness;Altered mental status   PT Problem List Decreased strength;Decreased range of motion;Decreased activity tolerance;Decreased balance;Decreased mobility;Decreased safety awareness;Decreased knowledge of precautions;Decreased knowledge of use of DME;Pain;Decreased cognition  PT Treatment Interventions DME instruction;Gait training;Functional mobility training;Therapeutic activities;Patient/family education;Balance training;Therapeutic exercise   PT Goals (Current goals can be found in the  Care Plan section) Acute Rehab PT Goals Patient Stated Goal: none stated PT Goal Formulation: With patient Time For Goal Achievement: 06/30/15 Potential to Achieve Goals: Fair    Frequency Min 3X/week   Barriers to discharge        Co-evaluation               End of Session   Activity Tolerance: Patient limited by fatigue;Patient limited by pain Patient left: in bed;with call bell/phone within reach;with bed alarm set      Functional Assessment Tool Used: clinical judgement Functional Limitation: Mobility: Walking and moving around Mobility: Walking and Moving Around Current Status (W0981): At least 20 percent but less than 40 percent impaired, limited or restricted Mobility: Walking and Moving Around Goal Status [phone]): At least 1 percent but less than 20 percent impaired, limited or restricted    Time: 8295-6213 PT Time Calculation (min) (ACUTE ONLY): 29 min   Charges:   PT Evaluation $Initial PT Evaluation Tier I: 1 Procedure PT Treatments $Therapeutic Activity: 8-22 mins   PT G Codes:   PT G-Codes **NOT FOR INPATIENT CLASS** Functional Assessment Tool Used: clinical judgement Functional Limitation: Mobility: Walking and moving around Mobility: Walking and Moving Around Current Status (Y8657): At least 20 percent but less than 40 percent impaired, limited or restricted Mobility: Walking and Moving Around Goal Status [phone]): At least 1 percent but less than 20 percent impaired, limited or restricted    Jane [name] Alert, MPT Pager: [phone]

## 2015-06-16 NOTE — ED Provider Notes (Signed)
Patient is an 81 year old female with past medical history severity of her dementia walker dependent then today with a fall. Patient unwitnessed fall. Has been falling multiple times at home. This patient was taken over by me by Dr. Read Drivers. Sign out was to discuss with social work whether we can have placement/increased resources at home.  Patient is unable to ambulate given that she has a humeral fracture and walker dependent.  Case management was able to increase services. However do not feel comfortable discharging this patient home at this time given her needs for standing or walking.  We will need EKG UA and chest x-ray to evaluate for cause of her recent falls.  Graciana Sessa Randall An, MD 06/16/15 1135

## 2015-06-16 NOTE — Care Management Note (Signed)
Case Management Note  Patient Details  Name: Jane [name] MRN: [id number] Date of Birth: Jan 04, 1925  Subjective/Objective:   81 y/o f admitted w/humeral fracture. Non displaced, no sx.From ALF.Active w/[facility]. HHC orders placed.[facility] rep farrah aware & following. [id number] given to dtr.               Action/Plan:d/c plan return back to ALF   Expected Discharge Date:   (UNKNOWN)               Expected Discharge Plan:  Assisted Living / Rest Home  In-House Referral:  Clinical Social Work  Discharge planning Services  CM Consult  Post Acute Care Choice:  Home Health ([facility]/PT/aide.) Choice offered to:     DME Arranged:    DME Agency:     HH Arranged:    HH Agency:     Status of Service:  In process, will continue to follow  Medicare Important Message Given:    Date Medicare IM Given:    Medicare IM give by:    Date Additional Medicare IM Given:    Additional Medicare Important Message give by:     If discussed at Long Length of Stay Meetings, dates discussed:    Additional Comments:  Lanier Clam, RN 06/16/2015, 1:35 PM

## 2015-06-16 NOTE — ED Notes (Signed)
Wound Care to the left side of face. Dried blood removed.

## 2015-06-16 NOTE — Care Management Note (Addendum)
Case Management Note  Patient Details  Name: Jane [name] MRN: [id number] Date of Birth: 17-Jan-1925  Subjective/Objective:        81 yr old medicare covered female in [place] ED  06/15/15 at 2250 after a fall at her ALF, [facility] in [place] Last BUN 23, creat WNL, hgb 11.2 wbc 12.3 given dose of rocephin in [facility] ED, and fentanyl for pain imagine show left nare fracture  Pt previously seen 05/20/15 by [facility] ED PM CM after a fall at her ALF-- [name], Daughter was with pt to confirm pt has [facility] to assist with 24 hr care, with fall alarm that notifies them of concern with pt.  Pt was set up with [facility] on 05/20/15         CM consulted by ED SW after she was consulted for level of care change, requesting assistance with possible home services  Pt and daughter agree to a trial of additional home health services, possible adding more [facility] services, SW spoke with ALF staff about possible additional services or increase level of are See ED SW notes  Pt is dominant on the right side not left (injured side) [name] states pt is on a budget and presently may have to work with ALF staff if increased level of care is needed Ht 5'1' wt 105 lbs. [name] states pt does not have a w/c at the apartment  Action/Plan: Spoke with ED SW, Reviewed EPIC notes, chart review, Contacted Otelia Santee of [facility] to request update on pt home services at 1017 Pending return call with update information  1117 CM updated EDP after speakign with Otelia Santee of [facility] (at 1056), pt and daughter [name] in pt room, ED SW updated   Expected Discharge Date:       June 16 2015            Expected Discharge Plan:    home with additional home health resources & resumption of PT, RN, Aide    In-House Referral:   ED SW   Discharge planning Services     home with additional home health resources & resumption of PT, RN, Aide   Post Acute Care Choice:     home with additional home health resources & resumption of PT, RN, Aide  Choice  offered to:   pt  DME Arranged:    light weight wheelchair DME Agency:    [facility]  HH Arranged:   HHPT resume plus do a trial of use of w/c for most night activities, [facility], [facility] HH Agency:   [facility]  Status of Service:   completed    Additional Comments:  Ophelia Shoulder, RN 06/16/2015, 10:21 AM

## 2015-06-16 NOTE — Discharge Summary (Deleted)
[facility] History and Physical  [name] TZG:[id number] DOB: [birth year] DOA: 06/15/2015  Referring physician:  PCP: Bufford Spikes, DO   Chief Complaint: Fall at home  HPI: Jane [name] is a 81 y.o. female with a past medical history of dementia, hypertension, osteoarthritis, who currently resides at an independent living facility, was brought to the emergency department after having a fall. History was provided by the patient's daughter who was present at bedside. It appears patient had an unremarkable day yesterday, had taken her evening dose of lorazepam at approximately 8:30 PM. At 9 pm while in the kitchen she reported feeling dizzy and lost her balance and fell. Patient experienced significant discomfort and was unable to bear weight. In the emergency room she was at found to have a left periorbital ecchymosis. Left upper extremity chest x-ray showed a displaced fracture, obliquely oriented of mid humerus. There is also nondisplaced obliquely oriented fracture of proximal humerus. No dislocation was seen. Head CT did not show evidence of traumatic intracranial injury. There was a fracture noted through the left orbital floor and lateral wall of the left maxillary sinus. History was obtained from her daughter who was present at bedside as patient unable to provide history or participate in her plan of care.                                                                     Review of Systems:  Could not obtain a reliable review of systems given dementia  Past Medical History  Diagnosis Date  . Hypertension   . Impacted cerumen   . Abnormality of gait   . Anal fissure   . Loss of weight   . Hyposmolality and/or hyponatremia   . Pain in limb   . Other vitamin B12 deficiency anemia   . Chest pain, unspecified   . Anemia, unspecified   . Anxiety state, unspecified   . Abdominal pain, right upper quadrant   . Other pulmonary embolism and infarction   . Intestinal or  peritoneal adhesions with obstruction (postoperative) (postinfection)   . Long term (current) use of anticoagulants   . Abdominal pain, right lower quadrant   . Essential and other specified forms of tremor   . Lumbago   . Unspecified hypothyroidism   . Major depressive disorder, single episode, unspecified   . Other and unspecified hyperlipidemia   . Macular degeneration (senile) of retina, unspecified   . Unspecified glaucoma   . Unspecified cataract   . Unspecified hearing loss   . Unspecified essential hypertension   . Osteoarthrosis, unspecified whether generalized or localized, unspecified site   . Osteoporosis, unspecified   . Abnormal involuntary movements(781.0)   . Emphysema of lung   . CAP (community acquired pneumonia) 01/31/2014  . Postoperative anemia due to acute blood loss 01/31/2014  . Laceration of left ear canal 04/13/2014  . Closed left hip fracture 06/25/2014  . Gastritis 02/13/2013  . Hyperlipidemia    Past Surgical History  Procedure Laterality Date  . Excisional hemorrhoidectomy  1980  . Bladder suspension  1993  . Back surgery  2003  . Abdominal hysterectomy    . Trigger finger release  2009    Dr Teressa Senter  . Intramedullary (im) nail intertrochanteric Right 01/27/2014  Procedure: INTRAMEDULLARY (IM) NAIL INTERTROCHANTRIC;  Surgeon: Sheral Apley, MD;  Location: [facility] OR;  Service: Orthopedics;  Laterality: Right;  . Intramedullary (im) nail intertrochanteric Left 06/26/2014    Procedure: LEFT HIP INTRAMEDULLARY (IM) NAIL;  Surgeon: Cheral Almas, MD;  Location: [facility] OR;  Service: Orthopedics;  Laterality: Left;   Social History:  reports that she has quit smoking. Her smoking use included Cigarettes. She quit after 40 years of use. She has never used smokeless tobacco. She reports that she does not drink alcohol or use illicit drugs.  Allergies  Allergen Reactions  . Celebrex [Celecoxib]     unknown  . Chlorzoxazone     unknown    Family History    Problem Relation Age of Onset  . Congestive Heart Failure Mother   . Macular degeneration Mother   . Heart disease Mother   . Cancer Sister     lung cancer  . Arthritis Daughter   . Heart disease Daughter   . Glaucoma Sister   . Cataracts Sister      Prior to Admission medications   Medication Sig Start Date End Date Taking? Authorizing Provider  albuterol (PROVENTIL HFA;VENTOLIN HFA) 108 (90 BASE) MCG/ACT inhaler Inhale 2 puffs into the lungs every 6 (six) hours as needed. Shortness of breath. 05/24/15  Yes Tiffany L Reed, DO  amLODipine (NORVASC) 5 MG tablet TAKE ONE TABLET BY MOUTH EVERY DAY 04/28/15  Yes Tiffany L Reed, DO  calcium carbonate (TUMS - DOSED IN MG ELEMENTAL CALCIUM) 500 MG chewable tablet Chew 1 tablet by mouth daily as needed for indigestion or heartburn.   Yes Historical Provider, MD  calcium-vitamin D (OSCAL WITH D) 500-200 MG-UNIT per tablet Take 1 tablet by mouth daily.   Yes Historical Provider, MD  dicyclomine (BENTYL) 10 MG capsule Take 10 mg by mouth at bedtime.   Yes Historical Provider, MD  fexofenadine (ALLEGRA) 180 MG tablet Take 180 mg by mouth daily as needed for allergies or rhinitis.   Yes Historical Provider, MD  HYDROcodone-acetaminophen (NORCO/VICODIN) 5-325 MG per tablet Take one tablet by mouth three times daily with meals and One tablet at bedtime for pain 05/24/15  Yes Tiffany L Reed, DO  latanoprost (XALATAN) 0.005 % ophthalmic solution Place 1 drop into both eyes at bedtime.   Yes Historical Provider, MD  levothyroxine (SYNTHROID, LEVOTHROID) 75 MCG tablet Take 1 tablet (75 mcg total) by mouth daily before breakfast. Take 1 tablet by mouth every other day 04/12/15  Yes Tiffany L Reed, DO  levothyroxine (SYNTHROID, LEVOTHROID) 88 MCG tablet TAKE ONE TABLET BY MOUTH 30 MINS BEFORE BREAKFAST FOR THYRIOD 05/27/15  Yes Tiffany L Reed, DO  LORazepam (ATIVAN) 0.5 MG tablet TAKE TWO TABLETS BY MOUTH AT BEDTIME TO HELP REST 04/23/15  Yes Tiffany L Reed, DO   lovastatin (MEVACOR) 20 MG tablet TAKE ONE TABLET BY MOUTH EVERY EVENING 04/28/15  Yes Tiffany L Reed, DO  Multiple Vitamins-Minerals (ICAPS LUTEIN & OMEGA-3) CAPS Take 1 capsule by mouth daily.   Yes Historical Provider, MD  MYRBETRIQ 50 MG TB24 tablet TAKE ONE TABLET BY MOUTH EVERY DAY FOR BLADDER 05/19/15  Yes Tiffany L Reed, DO  polyethylene glycol (MIRALAX / GLYCOLAX) packet Take 17 g by mouth daily. 03/01/15  Yes Christina P Rama, MD  QUEtiapine (SEROQUEL) 50 MG tablet Take 1 tablet (50 mg total) by mouth daily. 02/22/15  Yes Tiffany L Reed, DO  sertraline (ZOLOFT) 100 MG tablet Take one tablet by mouth once daily 09/15/14  Yes Oneal Grout, MD  vitamin B-12 (CYANOCOBALAMIN) 100 MCG tablet Take 1 tablet (100 mcg total) by mouth daily. 09/15/14  Yes Mahima Glade Lloyd, MD  traMADol (ULTRAM) 50 MG tablet Take 1 tablet (50 mg total) by mouth every 6 (six) hours as needed. Patient not taking: Reported on 06/16/2015 05/20/15   Eber Hong, MD   Physical Exam: Filed Vitals:   06/16/15 0759 06/16/15 1008 06/16/15 1200 06/16/15 1216  BP: 137/65 146/64 143/64 143/64  Pulse: 92 86  88  Temp:  98 F (36.7 C)  98.5 F (36.9 C)  TempSrc:  Oral  Oral  Resp: 24 17 17 20   SpO2: 97% 97%  95%    Wt Readings from Last 3 Encounters:  05/24/15 52.345 kg (115 lb 6.4 oz)  03/15/15 49.896 kg (110 lb)  03/03/15 49.351 kg (108 lb 12.8 oz)    General:  Elderly female, in no acute distress she is awake and alert confused, disoriented Eyes: PERRL, normal lids, irises & conjunctiva, she has left-sided periorbital ecchymosis ENT: grossly normal hearing, lips & tongue, hydrated oral mucosa Neck: no LAD, masses or thyromegaly Cardiovascular: RRR, no m/r/g. No LE edema. Telemetry: SR, no arrhythmias  Respiratory: CTA bilaterally, no w/r/r. Normal respiratory effort. Abdomen: soft, ntnd Skin: no rash or induration seen on limited exam, ecchymosis involving left periorbital region Musculoskeletal: grossly normal  tone BUE/BLE, there is significant pain with passive and active movement of her left upper extremity, there is also pain to her right ankle Psychiatric: Patient is confused and disoriented Neurologic: grossly non-focal.          Labs on Admission:  Basic Metabolic Panel:  Recent Labs Lab 06/15/15 2348  NA 139  K 3.7  CL 106  CO2 24  GLUCOSE 138*  BUN 23*  CREATININE 0.68  CALCIUM 9.0   Liver Function Tests: No results for input(s): AST, ALT, ALKPHOS, BILITOT, PROT, ALBUMIN in the last 168 hours. No results for input(s): LIPASE, AMYLASE in the last 168 hours. No results for input(s): AMMONIA in the last 168 hours. CBC:  Recent Labs Lab 06/15/15 2348  WBC 12.3*  NEUTROABS 10.6*  HGB 11.2*  HCT 34.4*  MCV 93.0  PLT 155   Cardiac Enzymes: No results for input(s): CKTOTAL, CKMB, CKMBINDEX, TROPONINI in the last 168 hours.  BNP (last 3 results) No results for input(s): BNP in the last 8760 hours.  ProBNP (last 3 results) No results for input(s): PROBNP in the last 8760 hours.  CBG: No results for input(s): GLUCAP in the last 168 hours.  Radiological Exams on Admission: Dg Ankle Complete Right  06/16/2015   CLINICAL DATA:  Right ankle pain, fell at home last night  EXAM: RIGHT ANKLE - COMPLETE 3+ VIEW  COMPARISON:  None.  FINDINGS: Four views of the right ankle submitted. No acute fracture or subluxation. There is diffuse osteopenia. Posterior spur of calcaneus. Ankle mortise is preserved.  IMPRESSION: No acute fracture or subluxation. Diffuse osteopenia. Posterior spurring of calcaneus.   Electronically Signed   By: 08/16/2015 M.D.   On: 06/16/2015 09:52   Ct Head Wo Contrast  06/16/2015   CLINICAL DATA:  Found on floor, with hematoma about the left orbit. Concern for head or cervical spine injury. Initial encounter.  EXAM: CT HEAD WITHOUT CONTRAST  CT CERVICAL SPINE WITHOUT CONTRAST  TECHNIQUE: Multidetector CT imaging of the head and cervical spine was performed  following the standard protocol without intravenous contrast. Multiplanar CT image reconstructions of the cervical  spine were also generated.  COMPARISON:  CT of the head performed 02/26/2015, and MRI of the brain performed 02/27/2015  FINDINGS: CT HEAD FINDINGS  There is no evidence of acute infarction, mass lesion, or intra- or extra-axial hemorrhage on CT.  Prominence of the ventricles and sulci reflects mild to moderate cortical volume loss. Diffuse periventricular and subcortical white matter change likely reflects small vessel ischemic microangiopathy.  The brainstem and fourth ventricle are within normal limits. The basal ganglia are unremarkable in appearance. The cerebral hemispheres demonstrate grossly normal gray-white differentiation. No mass effect or midline shift is seen.  There is a fracture through the left orbital floor and lateral wall of the left maxillary sinus, with trace air along the inferior aspect of the left orbit. There is no evidence of herniation of intraorbital contents. Blood is noted partially filling the left maxillary sinus. The visualized portions of the right orbit are within normal limits. The remaining paranasal sinuses and mastoid air cells are well-aerated. Diffuse soft tissue swelling is noted inferior to the left orbit, with scattered soft tissue air.  CT CERVICAL SPINE FINDINGS  There is no evidence of fracture or subluxation. Vertebral bodies demonstrate normal height and alignment. Multilevel disc space narrowing is noted along the cervical and upper thoracic spine. There is mild grade 1 retrolisthesis of C4 on C5, and mild grade 1 anterolisthesis of C7 on T1. Prevertebral soft tissues are prominent due to the patient's retropharyngeal common carotid arteries.  The thyroid gland is unremarkable in appearance. Interstitial prominence is noted at the lung apices. Scattered calcification is noted at the carotid bifurcations bilaterally.  IMPRESSION: 1. No evidence of  traumatic intracranial injury. 2. Fracture through the left orbital floor and lateral wall of the left maxillary sinus, with trace air along the inferior aspect of the left orbit. No evidence of herniation of intraorbital contents. Blood noted partially filling the left maxillary sinus. 3. Diffuse soft tissue swelling inferior to the left orbit, with scattered soft tissue air. 4. No evidence of fracture or subluxation along the cervical spine. 5. Mild to moderate cortical volume loss and diffuse small vessel ischemic microangiopathy. 6. Mild degenerative change along the cervical and upper thoracic spine. 7. Interstitial prominence at the lung apices. 8. Scattered calcification at the carotid bifurcations bilaterally. Carotid ultrasound would be helpful for further evaluation, when and as deemed clinically appropriate. These results were called by telephone at the time of interpretation on 06/16/2015 at 12:27 am to Dr. Paula Libra, who verbally acknowledged these results.   Electronically Signed   By: Roanna Raider M.D.   On: 06/16/2015 00:27   Ct Cervical Spine Wo Contrast  06/16/2015   CLINICAL DATA:  Found on floor, with hematoma about the left orbit. Concern for head or cervical spine injury. Initial encounter.  EXAM: CT HEAD WITHOUT CONTRAST  CT CERVICAL SPINE WITHOUT CONTRAST  TECHNIQUE: Multidetector CT imaging of the head and cervical spine was performed following the standard protocol without intravenous contrast. Multiplanar CT image reconstructions of the cervical spine were also generated.  COMPARISON:  CT of the head performed 02/26/2015, and MRI of the brain performed 02/27/2015  FINDINGS: CT HEAD FINDINGS  There is no evidence of acute infarction, mass lesion, or intra- or extra-axial hemorrhage on CT.  Prominence of the ventricles and sulci reflects mild to moderate cortical volume loss. Diffuse periventricular and subcortical white matter change likely reflects small vessel ischemic  microangiopathy.  The brainstem and fourth ventricle are within normal limits. The  basal ganglia are unremarkable in appearance. The cerebral hemispheres demonstrate grossly normal gray-white differentiation. No mass effect or midline shift is seen.  There is a fracture through the left orbital floor and lateral wall of the left maxillary sinus, with trace air along the inferior aspect of the left orbit. There is no evidence of herniation of intraorbital contents. Blood is noted partially filling the left maxillary sinus. The visualized portions of the right orbit are within normal limits. The remaining paranasal sinuses and mastoid air cells are well-aerated. Diffuse soft tissue swelling is noted inferior to the left orbit, with scattered soft tissue air.  CT CERVICAL SPINE FINDINGS  There is no evidence of fracture or subluxation. Vertebral bodies demonstrate normal height and alignment. Multilevel disc space narrowing is noted along the cervical and upper thoracic spine. There is mild grade 1 retrolisthesis of C4 on C5, and mild grade 1 anterolisthesis of C7 on T1. Prevertebral soft tissues are prominent due to the patient's retropharyngeal common carotid arteries.  The thyroid gland is unremarkable in appearance. Interstitial prominence is noted at the lung apices. Scattered calcification is noted at the carotid bifurcations bilaterally.  IMPRESSION: 1. No evidence of traumatic intracranial injury. 2. Fracture through the left orbital floor and lateral wall of the left maxillary sinus, with trace air along the inferior aspect of the left orbit. No evidence of herniation of intraorbital contents. Blood noted partially filling the left maxillary sinus. 3. Diffuse soft tissue swelling inferior to the left orbit, with scattered soft tissue air. 4. No evidence of fracture or subluxation along the cervical spine. 5. Mild to moderate cortical volume loss and diffuse small vessel ischemic microangiopathy. 6. Mild  degenerative change along the cervical and upper thoracic spine. 7. Interstitial prominence at the lung apices. 8. Scattered calcification at the carotid bifurcations bilaterally. Carotid ultrasound would be helpful for further evaluation, when and as deemed clinically appropriate. These results were called by telephone at the time of interpretation on 06/16/2015 at 12:27 am to Dr. Paula Libra, who verbally acknowledged these results.   Electronically Signed   By: Roanna Raider M.D.   On: 06/16/2015 00:27   Dg Pelvis Portable  06/16/2015   CLINICAL DATA:  Pain following fall  EXAM: PORTABLE PELVIS 1-2 VIEWS  COMPARISON:  May 20, 2015  FINDINGS: There is evidence of postoperative change in both proximal femurs with stable appearance in these areas compared to prior study. No acute fracture or dislocation. There is moderate narrowing of both hip joints. There is lumbar levoscoliosis. There is atherosclerotic calcification in the aorta and iliac arteries.  IMPRESSION: No acute fracture or dislocation. Postoperative change in both proximal femurs. Moderate narrowing of both hip joints.   Electronically Signed   By: Bretta Bang III M.D.   On: 06/16/2015 01:14   Dg Humerus Left  06/16/2015   CLINICAL DATA:  Pain following fall  EXAM: LEFT HUMERUS - 2+ VIEW  COMPARISON:  None.  FINDINGS: Frontal and lateral views were obtained. There is an obliquely oriented fracture of the mid humerus with lateral displacement of the distal fracture fragment with respect proximal fragment. There is a nondisplaced obliquely oriented fracture extending from the lateral proximal humeral metaphysis to the proximal diaphysis more medially. No dislocation. Joint spaces appear intact.  IMPRESSION: Displaced fracture, obliquely oriented, mid humerus. Nondisplaced obliquely oriented fracture proximal humerus. No dislocation.   Electronically Signed   By: Bretta Bang III M.D.   On: 06/16/2015 00:08    EKG: Independently  reviewed. No acute ischemic changes noted  Assessment/Plan Principal Problem:   Humeral fracture Active Problems:   Fall at home   Essential hypertension, benign   Hypothyroidism   Protein-calorie malnutrition, severe   Dementia   1. Status post fall. Jane [name] is a pleasant 81 year old female who currently resides in independent living facility. She reportedly was her usual state of health until yesterday evening were at approximately 9 PM she sustained a fall in her kitchen. Family members reporting that nursing aide had given her her nighttime meds which included Ativan. It appears she had reported having dizziness just prior for her fall. It is possible multiple psychotropic medications may be increasing her risk of having falls. Family members reporting patient having a history of recurrent falls. She is on Seroquel, Ativan, Zoloft, Bentyl and Norco. Will discontinue Ativan and Bentyl while decreasing her dose of Zoloft from 100 mg to 50 mg daily. Will place a physical therapy consultation. Plan to work her up for underlying infectious process as well. Will check a chest x-ray and a urinalysis. She may require placement at skilled nursing facility. 2. Left humeral fracture. Arm sling was placed, will need outpatient orthopedic surgery follow-up 3. Hypothyroidism. Will check a TSH meanwhile continue her home regimen with levothyroxine 75 g alternating with 88 g 4. Hypertension. Plan to continue Norvasc 5 mg by mouth daily 5. History of dementia with behavioral changes. Will continue her Seroquel at 50 mg by mouth daily, close monitoring for oversedation. 6. History of major depression. Will decrease her salt left os from 100 mg to 50 mg by mouth daily given her history of recurrent falls and concern for polypharmacy. 7. Severe protein calorie malnutrition. Provide protein boost 8. DVT prophylaxis. Lovenox 9. CODE STATUS. DO NOT RESUSCITATE. This was discussed with family members present  at bedside   Family Communication: Spoke with family members present at bedside Disposition Plan: Will place patient in overnight observation, consult physical therapy  Time spent: 65 minutes  Jeralyn Bennett [facility] Pager [phone]

## 2015-06-16 NOTE — Discharge Instructions (Signed)
Fracture A fracture is a break in a bone, due to a force on the bone that is greater than the bone's strength can handle. There are many types of fractures, including:  Complete fracture: The break passes completely through the bone.  Displaced: The ends of the bone fragments are not properly aligned.  Non-displaced: The ends of the bone fragments are in proper alignment.  Incomplete fracture (greenstick): The break does not pass completely through the bone. Incomplete fractures may or may not be angular (angulated).  Open fracture (compound): Part of the broken bone pokes through the skin. Open fractures have a high risk for infection.  Closed fracture: The fracture has not broken through the skin.  Comminuted fracture: The bone is broken into more than two pieces.  Compression fracture: The break occurs from extreme pressure on the bone (includes crushing injury).  Impacted fracture: The broken bone ends have been driven into each other.  Avulsion fracture: A ligament or tendon pulls a small piece of bone off from the main bony segment.  Pathologic fracture: A fracture due to the bone being made weak by a disease (osteoporosis or tumors).  Stress fracture: A fracture caused by intense exercise or repetitive and prolonged pressure that makes the bone weak. SYMPTOMS   Pain, tenderness, bleeding, bruising, and swelling at the fracture site.  Weakness and inability to bear weight on the injured extremity.  Paleness and deformity (sometimes).  Loss of pulse, numbness, tingling, or paralysis below the fracture site (usually a limb); these are emergencies. CAUSES  Bone being subjected to a force greater than its strength. RISK INCREASES WITH:  Contact sports and falls from heights.  Previous or current bone problems (osteoporosis or tumors).  Poor balance.  Poor strength and flexibility. PREVENTION   Warm up and stretch properly before activity.  Maintain physical  fitness:  Cardiovascular fitness.  Muscle strength.  Flexibility and endurance.  Wear proper protective equipment.  Use proper exercise technique. RELATED COMPLICATIONS   Bone fails to heal (nonunion).  Bone heals in a poor position (malunion).  Low blood volume (hypovolemic), shock due to blood loss.  Clump of fat cells travels through the blood (fat embolus) from the injury site to the lungs or brain (more common with thigh fractures).  Obstruction of nearby arteries. TREATMENT  Treatment first requires realigning of the bones (reduction) by a medically trained person, if the fracture is displaced. After realignment if the fracture is completed, or for non-displaced fractures, ice and medicine are used to reduce pain and inflammation. The bone and adjacent joints are then restrained with a splint, cast, or brace to allow the bones to heal without moving. Surgery is sometimes needed, to reposition the bones and hold the position with rods, pins, plates, or screws. Restraint for long periods of time may result in muscle and joint weakness or build up of fluid in tissues (edema). For this reason, physical therapy is often needed to regain strength and full range of motion. Recovery is complete when there is no bone motion at the fracture site and x-rays (radiographs) show complete healing.  MEDICATION   General anesthesia, sedation, or muscle relaxants may be needed to allow for realignment of the fracture. If pain medicine is needed, nonsteroidal anti-inflammatory medicines (aspirin and ibuprofen), or other minor pain relievers (acetaminophen), are often advised.  Do not take pain medicine for 7 days before surgery.  Stronger pain relievers may be prescribed by your caregiver. Use only as directed and only as much   as you need. SEEK MEDICAL CARE IF:   The following occur after restraint or surgery. (Report any of these signs immediately):  Swelling above or below the fracture  site.  Severe, persistent pain.  Blue or gray skin below the fracture site, especially under the nails. Numbness or loss of feeling below the fracture site. Document Released: 10/23/2005 Document Revised: 10/09/2012 Document Reviewed: 02/04/2009 ExitCare Patient Information 2015 [employer]. This information is not intended to replace advice given to you by your health care provider. Make sure you discuss any questions you have with your health care provider.  

## 2015-06-16 NOTE — Clinical Social Work Note (Addendum)
Clinical Social Work Assessment  Patient Details  Name: Jane [name] MRN: [id number] Date of Birth: Apr 13, 1925  Date of referral:  06/16/15               Reason for consult:  Facility Placement                Permission sought to share information with:  Facility Sport and exercise psychologist, Family Supports Permission granted to share information::  Yes, Verbal Permission Granted  Name::        Agency::  [place] at [facility] duty nursing at [facility]   Relationship::  provider at [employer] Information:  [phone]; [phone]  Housing/Transportation Living arrangements for the past 2 months:  [place] of Information:  Patient, Adult Children Patient Interpreter Needed:  None Criminal Activity/Legal Involvement Pertinent to Current Situation/Hospitalization:    Significant Relationships:  Adult Children Lives with:  Facility Resident Do you feel safe going back to the place where you live?  Yes Need for family participation in patient care:  Yes (Comment)  Care giving concerns:  Patient from independent living with private duty nursing assisted with dressing and medications. Pt uses a walker to walk, and unfortunately has a new left arm fracture and unable to use walker.    Social Worker assessment / plan:  CSW met with pt and pt daughter at bedside. Pt and pt daughter confirm she is a resident at [facility] independent living and can not afford private pay higher level of care at this time. Pt family concerned about patient being able to get more assistance. Pt currently recieves assistance with dressing and medications by [facility] and services can be increased. Patient also currently receives home health services from [facility]. Pt and pt family wish for patient to return to independent living with increased services. CSW consulted with nurse case manager who is assisting with home health services and will include an aid.   CSW  confirmed with [place] at [facility] that services can be increased and they will work with family while patient is healing.   Employment status:  Retired Forensic scientist:  Medicare PT Recommendations:  Not assessed at this time Information / Referral to community resources:     Patient/Family's Response to care:  Pt and pt daughter verbalized understanding and  agreement of plan.   Patient/Family's Understanding of and Emotional Response to Diagnosis, Current Treatment, and Prognosis:  Pt and pt family verbalized understanding of pt current diagnosis. Patient and pt family are accepting of needing more services.   Emotional Assessment Appearance:    Attitude/Demeanor/Rapport:   (calm and coopeartive) Affect (typically observed):  Accepting Orientation:  Oriented to Self, Oriented to Place, Oriented to Situation Alcohol / Substance use:  Not Applicable Psych involvement (Current and /or in the community):  No (Comment)  Discharge Needs  Concerns to be addressed:  Basic Needs Readmission within the last 30 days:  No Current discharge risk:  None Barriers to Discharge:  Barriers Resolved   Jenilee Franey, [place], LCSW 06/16/2015, 10:53 AM   Addendum: Due to patient not being able to walk and pain management, patient being admitted for further evaluation. Patient may benefit from skilled nursing for short term rehab pending pt eval.   1153am

## 2015-06-16 NOTE — ED Notes (Signed)
Patient unable to void at this time

## 2015-06-16 NOTE — ED Notes (Signed)
Pt can go at 13:15...klj

## 2015-06-16 NOTE — Progress Notes (Signed)
Orthopedic Tech Progress Note Patient Details:  Jane [name] Mar 25, 1925 [id number] Coaptation with posterior long arm splint applied. Tolerate well.  Ortho Devices Type of Ortho Device: Ace wrap, Arm sling, Coapt, Post (long arm) splint Ortho Device/Splint Location: LUE Ortho Device/Splint Interventions: Application   Asia R Thompson 06/16/2015, 1:57 AM

## 2015-06-16 NOTE — ED Notes (Signed)
See downtime documentation 

## 2015-06-17 ENCOUNTER — Other Ambulatory Visit: Payer: Self-pay | Admitting: Internal Medicine

## 2015-06-17 DIAGNOSIS — F329 Major depressive disorder, single episode, unspecified: Secondary | ICD-10-CM | POA: Diagnosis present

## 2015-06-17 DIAGNOSIS — M1990 Unspecified osteoarthritis, unspecified site: Secondary | ICD-10-CM | POA: Diagnosis present

## 2015-06-17 DIAGNOSIS — S02401A Maxillary fracture, unspecified, initial encounter for closed fracture: Secondary | ICD-10-CM | POA: Diagnosis present

## 2015-06-17 DIAGNOSIS — D519 Vitamin B12 deficiency anemia, unspecified: Secondary | ICD-10-CM | POA: Diagnosis present

## 2015-06-17 DIAGNOSIS — S42202A Unspecified fracture of upper end of left humerus, initial encounter for closed fracture: Secondary | ICD-10-CM | POA: Diagnosis present

## 2015-06-17 DIAGNOSIS — E039 Hypothyroidism, unspecified: Secondary | ICD-10-CM | POA: Diagnosis not present

## 2015-06-17 DIAGNOSIS — J310 Chronic rhinitis: Secondary | ICD-10-CM | POA: Diagnosis not present

## 2015-06-17 DIAGNOSIS — R278 Other lack of coordination: Secondary | ICD-10-CM | POA: Diagnosis not present

## 2015-06-17 DIAGNOSIS — Y92090 Kitchen in other non-institutional residence as the place of occurrence of the external cause: Secondary | ICD-10-CM | POA: Diagnosis not present

## 2015-06-17 DIAGNOSIS — Z6822 Body mass index (BMI) 22.0-22.9, adult: Secondary | ICD-10-CM | POA: Diagnosis not present

## 2015-06-17 DIAGNOSIS — Z87891 Personal history of nicotine dependence: Secondary | ICD-10-CM | POA: Diagnosis not present

## 2015-06-17 DIAGNOSIS — W19XXXA Unspecified fall, initial encounter: Secondary | ICD-10-CM | POA: Diagnosis not present

## 2015-06-17 DIAGNOSIS — T50905A Adverse effect of unspecified drugs, medicaments and biological substances, initial encounter: Secondary | ICD-10-CM | POA: Diagnosis present

## 2015-06-17 DIAGNOSIS — Z8249 Family history of ischemic heart disease and other diseases of the circulatory system: Secondary | ICD-10-CM | POA: Diagnosis not present

## 2015-06-17 DIAGNOSIS — H3530 Unspecified macular degeneration: Secondary | ICD-10-CM | POA: Diagnosis present

## 2015-06-17 DIAGNOSIS — D518 Other vitamin B12 deficiency anemias: Secondary | ICD-10-CM | POA: Diagnosis not present

## 2015-06-17 DIAGNOSIS — R1312 Dysphagia, oropharyngeal phase: Secondary | ICD-10-CM | POA: Diagnosis not present

## 2015-06-17 DIAGNOSIS — Z86711 Personal history of pulmonary embolism: Secondary | ICD-10-CM | POA: Diagnosis not present

## 2015-06-17 DIAGNOSIS — K30 Functional dyspepsia: Secondary | ICD-10-CM | POA: Diagnosis not present

## 2015-06-17 DIAGNOSIS — R262 Difficulty in walking, not elsewhere classified: Secondary | ICD-10-CM | POA: Diagnosis not present

## 2015-06-17 DIAGNOSIS — S02401D Maxillary fracture, unspecified, subsequent encounter for fracture with routine healing: Secondary | ICD-10-CM | POA: Diagnosis not present

## 2015-06-17 DIAGNOSIS — I10 Essential (primary) hypertension: Secondary | ICD-10-CM | POA: Diagnosis not present

## 2015-06-17 DIAGNOSIS — S42302A Unspecified fracture of shaft of humerus, left arm, initial encounter for closed fracture: Secondary | ICD-10-CM | POA: Diagnosis not present

## 2015-06-17 DIAGNOSIS — G8929 Other chronic pain: Secondary | ICD-10-CM | POA: Diagnosis not present

## 2015-06-17 DIAGNOSIS — E43 Unspecified severe protein-calorie malnutrition: Secondary | ICD-10-CM | POA: Diagnosis present

## 2015-06-17 DIAGNOSIS — M79602 Pain in left arm: Secondary | ICD-10-CM | POA: Diagnosis not present

## 2015-06-17 DIAGNOSIS — K5900 Constipation, unspecified: Secondary | ICD-10-CM | POA: Diagnosis not present

## 2015-06-17 DIAGNOSIS — W19XXXD Unspecified fall, subsequent encounter: Secondary | ICD-10-CM | POA: Diagnosis not present

## 2015-06-17 DIAGNOSIS — E785 Hyperlipidemia, unspecified: Secondary | ICD-10-CM | POA: Diagnosis present

## 2015-06-17 DIAGNOSIS — M6281 Muscle weakness (generalized): Secondary | ICD-10-CM | POA: Diagnosis not present

## 2015-06-17 DIAGNOSIS — F419 Anxiety disorder, unspecified: Secondary | ICD-10-CM | POA: Diagnosis not present

## 2015-06-17 DIAGNOSIS — E876 Hypokalemia: Secondary | ICD-10-CM | POA: Diagnosis present

## 2015-06-17 DIAGNOSIS — K589 Irritable bowel syndrome without diarrhea: Secondary | ICD-10-CM | POA: Diagnosis not present

## 2015-06-17 DIAGNOSIS — R41841 Cognitive communication deficit: Secondary | ICD-10-CM | POA: Diagnosis not present

## 2015-06-17 DIAGNOSIS — G4700 Insomnia, unspecified: Secondary | ICD-10-CM | POA: Diagnosis present

## 2015-06-17 DIAGNOSIS — Z66 Do not resuscitate: Secondary | ICD-10-CM | POA: Diagnosis present

## 2015-06-17 DIAGNOSIS — E784 Other hyperlipidemia: Secondary | ICD-10-CM | POA: Diagnosis not present

## 2015-06-17 DIAGNOSIS — W010XXA Fall on same level from slipping, tripping and stumbling without subsequent striking against object, initial encounter: Secondary | ICD-10-CM | POA: Diagnosis present

## 2015-06-17 DIAGNOSIS — S42202D Unspecified fracture of upper end of left humerus, subsequent encounter for fracture with routine healing: Secondary | ICD-10-CM | POA: Diagnosis not present

## 2015-06-17 DIAGNOSIS — N3281 Overactive bladder: Secondary | ICD-10-CM | POA: Diagnosis not present

## 2015-06-17 DIAGNOSIS — R0602 Shortness of breath: Secondary | ICD-10-CM | POA: Diagnosis not present

## 2015-06-17 DIAGNOSIS — J439 Emphysema, unspecified: Secondary | ICD-10-CM | POA: Diagnosis not present

## 2015-06-17 DIAGNOSIS — S023XXA Fracture of orbital floor, initial encounter for closed fracture: Secondary | ICD-10-CM | POA: Diagnosis present

## 2015-06-17 DIAGNOSIS — F0391 Unspecified dementia with behavioral disturbance: Secondary | ICD-10-CM | POA: Diagnosis not present

## 2015-06-17 DIAGNOSIS — F0390 Unspecified dementia without behavioral disturbance: Secondary | ICD-10-CM | POA: Diagnosis not present

## 2015-06-17 DIAGNOSIS — E038 Other specified hypothyroidism: Secondary | ICD-10-CM | POA: Diagnosis not present

## 2015-06-17 LAB — BASIC METABOLIC PANEL
ANION GAP: 10 (ref 5–15)
BUN: 16 mg/dL (ref 6–20)
CALCIUM: 8.7 mg/dL — AB (ref 8.9–10.3)
CHLORIDE: 108 mmol/L (ref 101–111)
CO2: 22 mmol/L (ref 22–32)
Creatinine, Ser: 0.65 mg/dL (ref 0.44–1.00)
GFR calc Af Amer: >60 mL/min (ref >60)
GFR calc non Af Amer: >60 mL/min (ref >60)
Glucose, Bld: 109 mg/dL — ABNORMAL HIGH (ref 65–99)
Potassium: 3.7 mmol/L (ref 3.5–5.1)
Sodium: 140 mmol/L (ref 135–145)

## 2015-06-17 LAB — CBC
HCT: 26.4 % — ABNORMAL LOW (ref 36.0–46.0)
HEMOGLOBIN: 8.6 g/dL — AB (ref 12.0–15.0)
MCH: 30.7 pg (ref 26.0–34.0)
MCHC: 32.6 g/dL (ref 30.0–36.0)
MCV: 94.3 fL (ref 78.0–100.0)
PLATELETS: 163 10*3/uL (ref 150–400)
RBC: 2.8 MIL/uL — AB (ref 3.87–5.11)
RDW: 13.1 % (ref 11.5–15.5)
WBC: 6.1 10*3/uL (ref 4.0–10.5)

## 2015-06-17 NOTE — Progress Notes (Addendum)
[facility] PROGRESS NOTE  Jane [name] SLH:[id number] DOB: 15-Apr-1925 DOA: 06/15/2015 PCP: Bufford Spikes, DO  Interim summary Patient is a pleasant 81 year old female with a past medical history of hypertension, dementia, ARTHRITIS, currently residing at an independent living facility who was admitted to medicine service on 06/16/2015. She doesn't have to have a follow-up letter facility in the kitchen. Imaging studies revealed the presence of left humerus fracture. She was also found to have fracture through the left orbital floor and lateral wall of the left maxillary sinus. She was admitted for pain management and physical therapy consultation. Last revealed a downward trend in her hemoglobin from 11.21 admission to 8.6 on 06/17/2015. Nursing staff did not report GI bleed. Stool for guaiac was sent.   Assessment/Plan: 1. Status post fall. -Patient having a fall at her independent living facility. Family matters reporting that she took her Ativan dose at 8:30 PM then around onychauxis felt dizzy and had a fall in her kitchen. She is on multiple psychotropic medications which include Bentyl, Zoloft, Norco, Seroquel. I think these agents likely increase her chances of having falls. -I discontinued Ativan and Banzel and decrease Zoloft dose from 100 mg to 50 mg by mouth daily -On my evaluation this morning she is awake and alert, ambulated with assistance down the hallway  2.  Acute anemia. -Her hemoglobin trended down from 11.2 on 06/15/2015 to 8.6 on this mornings lab work -Nursing staff did not report evidence of GI bleed -Will check iron studies as well as a stool for occult blood -Follow-up on a.m. lab work.  3.  Hypothyroidism -Her TSH mildly suppressed at 0.329 -For now I will continue her current regimen with Synthroid 75 g alternating with 88 g  4.  Left humeral fracture. -Arm sling placed. -Patient to follow-up with orthopedic surgery in the outpatient setting  5.   Hypertension. -Blood pressures are stable, continue Norvasc 5 mg by mouth daily  6.  History of dementia with behavioral changes. -Will continue Seroquel at 50 mg by mouth twice a day. I discontinued Ativan given recent falls and the possibility of multiple psychotropic medications interacting. -Clinically stable  7.  Protein calorie malnutrition -Protein boost  Code Status: DO NOT RESUSCITATE Family Communication:  Disposition Plan: Social work consulted for skilled nursing facility placement   Consultants:  Physical therapy   HPI/Subjective: Patient seems little better this morning, she reports pain with movement to her left upper extremity. She was able to get out of bed with assistance today and ambulate down the hallway.  Objective: Filed Vitals:   06/17/15 0438  BP: 124/50  Pulse: 92  Temp: 98.1 F (36.7 C)  Resp: 20    Intake/Output Summary (Last 24 hours) at 06/17/15 1342 Last data filed at 06/17/15 0850  Gross per 24 hour  Intake   1320 ml  Output    500 ml  Net    820 ml   Filed Weights   06/16/15 1255  Weight: 54.8 kg (120 lb 13 oz)    Exam:   General:  Patient is awake and alert, pleasant, cooperative. She has left periorbital ecchymosis  Cardiovascular: Regular rate and rhythm normal S1-S2 no murmurs rubs gallops  Respiratory: Normal respiratory effort, lungs are clear to auscultation bilaterally  Abdomen: Soft nontender nondistended  Musculoskeletal: Patient having significant pain with movement of her left upper extremity  Data Reviewed: Basic Metabolic Panel:  Recent Labs Lab 06/15/15 2348 06/16/15 1510 06/17/15 0540  NA 139  --  140  K 3.7  --  3.7  CL 106  --  108  CO2 24  --  22  GLUCOSE 138*  --  109*  BUN 23*  --  16  CREATININE 0.68 0.83 0.65  CALCIUM 9.0  --  8.7*   Liver Function Tests: No results for input(s): AST, ALT, ALKPHOS, BILITOT, PROT, ALBUMIN in the last 168 hours. No results for input(s): LIPASE, AMYLASE  in the last 168 hours. No results for input(s): AMMONIA in the last 168 hours. CBC:  Recent Labs Lab 06/15/15 2348 06/16/15 1510 06/17/15 0540  WBC 12.3* 8.8 6.1  NEUTROABS 10.6*  --   --   HGB 11.2* 10.0* 8.6*  HCT 34.4* 30.3* 26.4*  MCV 93.0 94.4 94.3  PLT 155 189 163   Cardiac Enzymes: No results for input(s): CKTOTAL, CKMB, CKMBINDEX, TROPONINI in the last 168 hours. BNP (last 3 results) No results for input(s): BNP in the last 8760 hours.  ProBNP (last 3 results) No results for input(s): PROBNP in the last 8760 hours.  CBG: No results for input(s): GLUCAP in the last 168 hours.  No results found for this or any previous visit (from the past 240 hour(s)).   Studies: Dg Ankle Complete Right  06/16/2015   CLINICAL DATA:  Right ankle pain, fell at home last night  EXAM: RIGHT ANKLE - COMPLETE 3+ VIEW  COMPARISON:  None.  FINDINGS: Four views of the right ankle submitted. No acute fracture or subluxation. There is diffuse osteopenia. Posterior spur of calcaneus. Ankle mortise is preserved.  IMPRESSION: No acute fracture or subluxation. Diffuse osteopenia. Posterior spurring of calcaneus.   Electronically Signed   By: Natasha Mead M.D.   On: 06/16/2015 09:52   Ct Head Wo Contrast  06/16/2015   CLINICAL DATA:  Found on floor, with hematoma about the left orbit. Concern for head or cervical spine injury. Initial encounter.  EXAM: CT HEAD WITHOUT CONTRAST  CT CERVICAL SPINE WITHOUT CONTRAST  TECHNIQUE: Multidetector CT imaging of the head and cervical spine was performed following the standard protocol without intravenous contrast. Multiplanar CT image reconstructions of the cervical spine were also generated.  COMPARISON:  CT of the head performed 02/26/2015, and MRI of the brain performed 02/27/2015  FINDINGS: CT HEAD FINDINGS  There is no evidence of acute infarction, mass lesion, or intra- or extra-axial hemorrhage on CT.  Prominence of the ventricles and sulci reflects mild to  moderate cortical volume loss. Diffuse periventricular and subcortical white matter change likely reflects small vessel ischemic microangiopathy.  The brainstem and fourth ventricle are within normal limits. The basal ganglia are unremarkable in appearance. The cerebral hemispheres demonstrate grossly normal gray-white differentiation. No mass effect or midline shift is seen.  There is a fracture through the left orbital floor and lateral wall of the left maxillary sinus, with trace air along the inferior aspect of the left orbit. There is no evidence of herniation of intraorbital contents. Blood is noted partially filling the left maxillary sinus. The visualized portions of the right orbit are within normal limits. The remaining paranasal sinuses and mastoid air cells are well-aerated. Diffuse soft tissue swelling is noted inferior to the left orbit, with scattered soft tissue air.  CT CERVICAL SPINE FINDINGS  There is no evidence of fracture or subluxation. Vertebral bodies demonstrate normal height and alignment. Multilevel disc space narrowing is noted along the cervical and upper thoracic spine. There is mild grade 1 retrolisthesis of C4 on C5, and mild grade 1  anterolisthesis of C7 on T1. Prevertebral soft tissues are prominent due to the patient's retropharyngeal common carotid arteries.  The thyroid gland is unremarkable in appearance. Interstitial prominence is noted at the lung apices. Scattered calcification is noted at the carotid bifurcations bilaterally.  IMPRESSION: 1. No evidence of traumatic intracranial injury. 2. Fracture through the left orbital floor and lateral wall of the left maxillary sinus, with trace air along the inferior aspect of the left orbit. No evidence of herniation of intraorbital contents. Blood noted partially filling the left maxillary sinus. 3. Diffuse soft tissue swelling inferior to the left orbit, with scattered soft tissue air. 4. No evidence of fracture or subluxation  along the cervical spine. 5. Mild to moderate cortical volume loss and diffuse small vessel ischemic microangiopathy. 6. Mild degenerative change along the cervical and upper thoracic spine. 7. Interstitial prominence at the lung apices. 8. Scattered calcification at the carotid bifurcations bilaterally. Carotid ultrasound would be helpful for further evaluation, when and as deemed clinically appropriate. These results were called by telephone at the time of interpretation on 06/16/2015 at 12:27 am to Dr. Paula Libra, who verbally acknowledged these results.   Electronically Signed   By: Roanna Raider M.D.   On: 06/16/2015 00:27   Ct Cervical Spine Wo Contrast  06/16/2015   CLINICAL DATA:  Found on floor, with hematoma about the left orbit. Concern for head or cervical spine injury. Initial encounter.  EXAM: CT HEAD WITHOUT CONTRAST  CT CERVICAL SPINE WITHOUT CONTRAST  TECHNIQUE: Multidetector CT imaging of the head and cervical spine was performed following the standard protocol without intravenous contrast. Multiplanar CT image reconstructions of the cervical spine were also generated.  COMPARISON:  CT of the head performed 02/26/2015, and MRI of the brain performed 02/27/2015  FINDINGS: CT HEAD FINDINGS  There is no evidence of acute infarction, mass lesion, or intra- or extra-axial hemorrhage on CT.  Prominence of the ventricles and sulci reflects mild to moderate cortical volume loss. Diffuse periventricular and subcortical white matter change likely reflects small vessel ischemic microangiopathy.  The brainstem and fourth ventricle are within normal limits. The basal ganglia are unremarkable in appearance. The cerebral hemispheres demonstrate grossly normal gray-white differentiation. No mass effect or midline shift is seen.  There is a fracture through the left orbital floor and lateral wall of the left maxillary sinus, with trace air along the inferior aspect of the left orbit. There is no evidence of  herniation of intraorbital contents. Blood is noted partially filling the left maxillary sinus. The visualized portions of the right orbit are within normal limits. The remaining paranasal sinuses and mastoid air cells are well-aerated. Diffuse soft tissue swelling is noted inferior to the left orbit, with scattered soft tissue air.  CT CERVICAL SPINE FINDINGS  There is no evidence of fracture or subluxation. Vertebral bodies demonstrate normal height and alignment. Multilevel disc space narrowing is noted along the cervical and upper thoracic spine. There is mild grade 1 retrolisthesis of C4 on C5, and mild grade 1 anterolisthesis of C7 on T1. Prevertebral soft tissues are prominent due to the patient's retropharyngeal common carotid arteries.  The thyroid gland is unremarkable in appearance. Interstitial prominence is noted at the lung apices. Scattered calcification is noted at the carotid bifurcations bilaterally.  IMPRESSION: 1. No evidence of traumatic intracranial injury. 2. Fracture through the left orbital floor and lateral wall of the left maxillary sinus, with trace air along the inferior aspect of the left orbit. No evidence  of herniation of intraorbital contents. Blood noted partially filling the left maxillary sinus. 3. Diffuse soft tissue swelling inferior to the left orbit, with scattered soft tissue air. 4. No evidence of fracture or subluxation along the cervical spine. 5. Mild to moderate cortical volume loss and diffuse small vessel ischemic microangiopathy. 6. Mild degenerative change along the cervical and upper thoracic spine. 7. Interstitial prominence at the lung apices. 8. Scattered calcification at the carotid bifurcations bilaterally. Carotid ultrasound would be helpful for further evaluation, when and as deemed clinically appropriate. These results were called by telephone at the time of interpretation on 06/16/2015 at 12:27 am to Dr. Paula Libra, who verbally acknowledged these results.    Electronically Signed   By: Roanna Raider M.D.   On: 06/16/2015 00:27   Dg Pelvis Portable  06/16/2015   CLINICAL DATA:  Pain following fall  EXAM: PORTABLE PELVIS 1-2 VIEWS  COMPARISON:  May 20, 2015  FINDINGS: There is evidence of postoperative change in both proximal femurs with stable appearance in these areas compared to prior study. No acute fracture or dislocation. There is moderate narrowing of both hip joints. There is lumbar levoscoliosis. There is atherosclerotic calcification in the aorta and iliac arteries.  IMPRESSION: No acute fracture or dislocation. Postoperative change in both proximal femurs. Moderate narrowing of both hip joints.   Electronically Signed   By: Bretta Bang III M.D.   On: 06/16/2015 01:14   Dg Chest Port 1 View  06/16/2015   CLINICAL DATA:  Mid chest discomfort.  Left humerus fracture.  EXAM: PORTABLE CHEST - 1 VIEW  COMPARISON:  02/26/2015  FINDINGS: Again noted is the fracture involving the left humerus. There is a well-defined density in the left lower chest which is probably related to overlying soft tissues. Otherwise, the lungs are clear. Again noted are multiple costal calcifications. Evidence for hyperinflation and underlying emphysematous changes.  IMPRESSION: Density overlying the left lower chest may represent overlying soft tissues but indeterminate. Otherwise, no focal lung disease. Consider follow-up of this area with a two view chest examination.  Hyperinflation with emphysematous changes.   Electronically Signed   By: Richarda Overlie M.D.   On: 06/16/2015 12:51   Dg Humerus Left  06/16/2015   CLINICAL DATA:  Pain following fall  EXAM: LEFT HUMERUS - 2+ VIEW  COMPARISON:  None.  FINDINGS: Frontal and lateral views were obtained. There is an obliquely oriented fracture of the mid humerus with lateral displacement of the distal fracture fragment with respect proximal fragment. There is a nondisplaced obliquely oriented fracture extending from the lateral  proximal humeral metaphysis to the proximal diaphysis more medially. No dislocation. Joint spaces appear intact.  IMPRESSION: Displaced fracture, obliquely oriented, mid humerus. Nondisplaced obliquely oriented fracture proximal humerus. No dislocation.   Electronically Signed   By: Bretta Bang III M.D.   On: 06/16/2015 00:08    Scheduled Meds: . amLODipine  5 mg Oral Daily  . calcium-vitamin D  1 tablet Oral Daily  . enoxaparin (LOVENOX) injection  40 mg Subcutaneous Q24H  . latanoprost  1 drop Both Eyes QHS  . [START ON 06/18/2015] levothyroxine  75 mcg Oral Q48H  . levothyroxine  88 mcg Oral Q48H  . mirabegron ER  50 mg Oral Daily  . multivitamin-lutein  1 capsule Oral Daily  . polyethylene glycol  17 g Oral Daily  . pravastatin  20 mg Oral q1800  . QUEtiapine  50 mg Oral Daily  . sertraline  50 mg  Oral Daily  . sodium chloride  3 mL Intravenous Q12H   Continuous Infusions: . sodium chloride 75 mL/hr at 06/16/15 2349    Principal Problem:   Humeral fracture Active Problems:   Fall at home   Essential hypertension, benign   Hypothyroidism   Protein-calorie malnutrition, severe   Dementia    Time spent: 35 min    Jeralyn Bennett  [facility] Pager [phone]. If 7PM-7AM, please contact night-coverage at [email or web address], password [facility] 06/17/2015, 1:42 PM  LOS: 1 day

## 2015-06-17 NOTE — Clinical Social Work Placement (Signed)
CSW reviewed order for patient to be changed from Observation to Inpatient. CSW spoke with patient's daughter, [name] at bedside to confirm plans for SNF at discharge - requesting [facility] or [address].    Lincoln Maxin, LCSW [facility] Clinical Social Worker cell #: [phone]    CLINICAL SOCIAL WORK PLACEMENT  NOTE  Date:  06/17/2015  Patient Details  Name: Jane [name] MRN: [id number] Date of Birth: 22-Oct-1925  Clinical Social Work is seeking post-discharge placement for this patient at the Skilled  Nursing Facility level of care (*CSW will initial, date and re-position this form in  chart as items are completed):  Yes   Patient/family provided with [facility] Clinical Social Work Department's list of facilities offering this level of care within the geographic area requested by the patient (or if unable, by the patient's family).  Yes   Patient/family informed of their freedom to choose among providers that offer the needed level of care, that participate in Medicare, Medicaid or managed care program needed by the patient, have an available bed and are willing to accept the patient.  Yes   Patient/family informed of [facility]'s ownership interest in [facility] and [facility], as well as of the fact that they are under no obligation to receive care at these facilities.  PASRR submitted to EDS on 06/17/15     PASRR number received on 06/17/15     Existing PASRR number confirmed on       FL2 transmitted to all facilities in geographic area requested by pt/family on 06/17/15     FL2 transmitted to all facilities within larger geographic area on       Patient informed that his/her managed care company has contracts with or will negotiate with certain facilities, including the following:        Yes   Patient/family informed of bed offers received.  Patient chooses bed at       Physician recommends and patient chooses bed at      Patient to be  transferred to   on  .  Patient to be transferred to facility by       Patient family notified on   of transfer.  Name of family member notified:        PHYSICIAN       Additional Comment:    _______________________________________________ Arlyss Repress, LCSW 06/17/2015, 3:16 PM

## 2015-06-17 NOTE — Progress Notes (Signed)
Physical Therapy Treatment Patient Details Name: Jane [name] MRN: [id number] DOB: 04-Jun-1925 Today's Date: 06/17/2015    History of Present Illness 81 yo female admitted with L prox humeral fx after fall at home. Hx of dementia, multiple falls, bil hip IM nail in 2015. [facility] from [facility].     PT Comments    Dr. Vanessa Barbara in room and requesting to see pt mobilize.  Pt assisted with ambulation and then recliner end of session.  Pt requiring assist for safety and stabilizing at this time so continue to recommend SNF upon d/c.  Follow Up Recommendations  SNF;Supervision/Assistance - 24 hour     Equipment Recommendations  Wheelchair (measurements PT)    Recommendations for Other Services OT consult     Precautions / Restrictions Precautions Precautions: Fall;Shoulder Shoulder Interventions: Shoulder sling/immobilizer Restrictions Weight Bearing Restrictions: Yes LUE Weight Bearing: Non weight bearing    Mobility  Bed Mobility Overal bed mobility: Needs Assistance Bed Mobility: Supine to Sit     Supine to sit: HOB elevated;Supervision     General bed mobility comments: increased time and effort, used R rail to self assist, verbal cues for technique  Transfers Overall transfer level: Needs assistance Equipment used: Rolling walker (2 wheeled) Transfers: Sit to/from Stand Sit to Stand: Min assist         General transfer comment: Dr. Vanessa Barbara in room and provided RW, discussed need for hemiwalker next visit as pt NWB on L UE so provided assist for L side of RW today, assist to rise and steady from bed and BSC, cues for hand placement  Ambulation/Gait Ambulation/Gait assistance: Min assist Ambulation Distance (Feet): 80 Feet Assistive device: Rolling walker (2 wheeled) Gait Pattern/deviations: Step-through pattern;Trunk flexed;Drifts right/left;Decreased stride length     General Gait Details: assist for L side of RW and manevuering RW, assist to steady  occasionally   Information systems manager [place] (Stroke Patients Only)       Balance                                    Cognition Arousal/Alertness: Awake/alert Behavior During Therapy: WFL for tasks assessed/performed Overall Cognitive Status: History of cognitive impairments - at baseline                      Exercises      General Comments        Pertinent Vitals/Pain Pain Assessment: Faces Faces Pain Scale: Hurts a little bit Pain Location: L UE Pain Intervention(s): Limited activity within patient's tolerance;Monitored during session;Repositioned    Home Living                      Prior Function            PT Goals (current goals can now be found in the care plan section) Progress towards PT goals: Progressing toward goals    Frequency  Min 3X/week    PT Plan Current plan remains appropriate    Co-evaluation             End of Session Equipment Utilized During Treatment: Gait belt Activity Tolerance: Patient tolerated treatment well Patient left: in chair;with call bell/phone within reach;with chair alarm set     Time: 6578-4696 PT Time Calculation (min) (ACUTE ONLY): 17 min  Charges:  $  Gait Training: 8-22 mins                    G Codes:      Jane [name],Jane [name] 06/21/15, 1:29 PM Jane [name], PT, DPT 06-21-2015 Pager: [phone]

## 2015-06-17 NOTE — H&P (Signed)
[facility] History and Physical  Jane [name] AVW:[id number] DOB: 04/18/1925 DOA: 06/15/2015  Referring physician:  PCP: Bufford Spikes, DO   Chief Complaint: Fall at home  HPI: Jane [name] is a 81 y.o. female with a past medical history of dementia, hypertension, osteoarthritis, who currently resides at an independent living facility, was brought to the emergency department after having a fall. History was provided by the patient's daughter who was present at bedside. It appears patient had an unremarkable day yesterday, had taken her evening dose of lorazepam at approximately 8:30 PM. At 9 pm while in the kitchen she reported feeling dizzy and lost her balance and fell. Patient experienced significant discomfort and was unable to bear weight. In the emergency room she was at found to have a left periorbital ecchymosis. Left upper extremity chest x-ray showed a displaced fracture, obliquely oriented of mid humerus. There is also nondisplaced obliquely oriented fracture of proximal humerus. No dislocation was seen. Head CT did not show evidence of traumatic intracranial injury. There was a fracture noted through the left orbital floor and lateral wall of the left maxillary sinus. History was obtained from her daughter who was present at bedside as patient unable to provide history or participate in her plan of care.     Review of Systems:  Could not obtain a reliable review of systems given dementia  Past Medical History  Diagnosis Date  . Hypertension   . Impacted cerumen   . Abnormality of gait   . Anal fissure   . Loss of weight   . Hyposmolality and/or hyponatremia   . Pain in limb   . Other vitamin B12 deficiency anemia   . Chest pain, unspecified   . Anemia, unspecified   . Anxiety state, unspecified   . Abdominal pain, right upper quadrant   . Other pulmonary embolism  and infarction   . Intestinal or peritoneal adhesions with obstruction (postoperative) (postinfection)   . Long term (current) use of anticoagulants   . Abdominal pain, right lower quadrant   . Essential and other specified forms of tremor   . Lumbago   . Unspecified hypothyroidism   . Major depressive disorder, single episode, unspecified   . Other and unspecified hyperlipidemia   . Macular degeneration (senile) of retina, unspecified   . Unspecified glaucoma   . Unspecified cataract   . Unspecified hearing loss   . Unspecified essential hypertension   . Osteoarthrosis, unspecified whether generalized or localized, unspecified site   . Osteoporosis, unspecified   . Abnormal involuntary movements(781.0)   . Emphysema of lung   . CAP (community acquired pneumonia) 01/31/2014  . Postoperative anemia due to acute blood loss 01/31/2014  . Laceration of left ear canal 04/13/2014  . Closed left hip fracture 06/25/2014  . Gastritis 02/13/2013  . Hyperlipidemia    Past Surgical History  Procedure Laterality Date  . Excisional hemorrhoidectomy  1980  . Bladder suspension  1993  . Back surgery  2003  . Abdominal hysterectomy    . Trigger finger release  2009    Dr Teressa Senter  . Intramedullary (im) nail intertrochanteric Right 01/27/2014    Procedure: INTRAMEDULLARY (IM) NAIL INTERTROCHANTRIC; Surgeon: Sheral Apley, MD; Location: [facility] OR; Service: Orthopedics; Laterality: Right;  . Intramedullary (im) nail intertrochanteric Left 06/26/2014    Procedure: LEFT HIP INTRAMEDULLARY (IM) NAIL; Surgeon: Cheral Almas, MD; Location: [facility] OR; Service: Orthopedics; Laterality: Left;   Social History:  reports that she has quit smoking. Her smoking use included  Cigarettes. She quit after 40 years of use. She has never used smokeless tobacco. She reports that she does not drink alcohol or  use illicit drugs.  Allergies  Allergen Reactions  . Celebrex [Celecoxib]     unknown  . Chlorzoxazone     unknown    Family History  Problem Relation Age of Onset  . Congestive Heart Failure Mother   . Macular degeneration Mother   . Heart disease Mother   . Cancer Sister     lung cancer  . Arthritis Daughter   . Heart disease Daughter   . Glaucoma Sister   . Cataracts Sister      Prior to Admission medications   Medication Sig Start Date End Date Taking? Authorizing Provider  albuterol (PROVENTIL HFA;VENTOLIN HFA) 108 (90 BASE) MCG/ACT inhaler Inhale 2 puffs into the lungs every 6 (six) hours as needed. Shortness of breath. 05/24/15  Yes Tiffany L Reed, DO  amLODipine (NORVASC) 5 MG tablet TAKE ONE TABLET BY MOUTH EVERY DAY 04/28/15  Yes Tiffany L Reed, DO  calcium carbonate (TUMS - DOSED IN MG ELEMENTAL CALCIUM) 500 MG chewable tablet Chew 1 tablet by mouth daily as needed for indigestion or heartburn.   Yes Historical Provider, MD  calcium-vitamin D (OSCAL WITH D) 500-200 MG-UNIT per tablet Take 1 tablet by mouth daily.   Yes Historical Provider, MD  dicyclomine (BENTYL) 10 MG capsule Take 10 mg by mouth at bedtime.   Yes Historical Provider, MD  fexofenadine (ALLEGRA) 180 MG tablet Take 180 mg by mouth daily as needed for allergies or rhinitis.   Yes Historical Provider, MD  HYDROcodone-acetaminophen (NORCO/VICODIN) 5-325 MG per tablet Take one tablet by mouth three times daily with meals and One tablet at bedtime for pain 05/24/15  Yes Tiffany L Reed, DO  latanoprost (XALATAN) 0.005 % ophthalmic solution Place 1 drop into both eyes at bedtime.   Yes Historical Provider, MD  levothyroxine (SYNTHROID, LEVOTHROID) 75 MCG tablet Take 1 tablet (75 mcg total) by mouth daily before breakfast. Take 1 tablet by mouth every other day 04/12/15  Yes Tiffany L Reed, DO   levothyroxine (SYNTHROID, LEVOTHROID) 88 MCG tablet TAKE ONE TABLET BY MOUTH 30 MINS BEFORE BREAKFAST FOR THYRIOD 05/27/15  Yes Tiffany L Reed, DO  LORazepam (ATIVAN) 0.5 MG tablet TAKE TWO TABLETS BY MOUTH AT BEDTIME TO HELP REST 04/23/15  Yes Tiffany L Reed, DO  lovastatin (MEVACOR) 20 MG tablet TAKE ONE TABLET BY MOUTH EVERY EVENING 04/28/15  Yes Tiffany L Reed, DO  Multiple Vitamins-Minerals (ICAPS LUTEIN & OMEGA-3) CAPS Take 1 capsule by mouth daily.   Yes Historical Provider, MD  MYRBETRIQ 50 MG TB24 tablet TAKE ONE TABLET BY MOUTH EVERY DAY FOR BLADDER 05/19/15  Yes Tiffany L Reed, DO  polyethylene glycol (MIRALAX / GLYCOLAX) packet Take 17 g by mouth daily. 03/01/15  Yes Christina P Rama, MD  QUEtiapine (SEROQUEL) 50 MG tablet Take 1 tablet (50 mg total) by mouth daily. 02/22/15  Yes Tiffany L Reed, DO  sertraline (ZOLOFT) 100 MG tablet Take one tablet by mouth once daily 09/15/14  Yes Mahima Pandey, MD  vitamin B-12 (CYANOCOBALAMIN) 100 MCG tablet Take 1 tablet (100 mcg total) by mouth daily. 09/15/14  Yes Mahima Glade Lloyd, MD  traMADol (ULTRAM) 50 MG tablet Take 1 tablet (50 mg total) by mouth every 6 (six) hours as needed. Patient not taking: Reported on 06/16/2015 05/20/15   Eber Hong, MD   Physical Exam: Filed Vitals:   06/16/15 0759 06/16/15  1008 06/16/15 1200 06/16/15 1216  BP: 137/65 146/64 143/64 143/64  Pulse: 92 86  88  Temp:  98 F (36.7 C)  98.5 F (36.9 C)  TempSrc:  Oral  Oral  Resp: SpO2: 97% 97%  95%    Wt Readings from Last 3 Encounters:  05/24/15 52.345 kg (115 lb 6.4 oz)  03/15/15 49.896 kg (110 lb)  03/03/15 49.351 kg (108 lb 12.8 oz)     General: Elderly female, in no acute distress she is awake and alert confused, disoriented  Eyes: PERRL, normal lids, irises & conjunctiva, she has left-sided periorbital ecchymosis  ENT: grossly normal  hearing, lips & tongue, hydrated oral mucosa  Neck: no LAD, masses or thyromegaly  Cardiovascular: RRR, no m/r/g. No LE edema.  Telemetry: SR, no arrhythmias   Respiratory: CTA bilaterally, no w/r/r. Normal respiratory effort.  Abdomen: soft, ntnd  Skin: no rash or induration seen on limited exam, ecchymosis involving left periorbital region  Musculoskeletal: grossly normal tone BUE/BLE, there is significant pain with passive and active movement of her left upper extremity, there is also pain to her right ankle  Psychiatric: Patient is confused and disoriented  Neurologic: grossly non-focal.          Labs on Admission:  Basic Metabolic Panel:  Last Labs      Recent Labs Lab 06/15/15 2348  NA 139  K 3.7  CL 106  CO2 24  GLUCOSE 138*  BUN 23*  CREATININE 0.68  CALCIUM 9.0     Liver Function Tests:  Last Labs     No results for input(s): AST, ALT, ALKPHOS, BILITOT, PROT, ALBUMIN in the last 168 hours.    Last Labs     No results for input(s): LIPASE, AMYLASE in the last 168 hours.    Last Labs     No results for input(s): AMMONIA in the last 168 hours.   CBC:  Last Labs      Recent Labs Lab 06/15/15 2348  WBC 12.3*  NEUTROABS 10.6*  HGB 11.2*  HCT 34.4*  MCV 93.0  PLT 155     Cardiac Enzymes:  Last Labs     No results for input(s): CKTOTAL, CKMB, CKMBINDEX, TROPONINI in the last 168 hours.    BNP (last 3 results)  Recent Labs (within last 365 days)    No results for input(s): BNP in the last 8760 hours.    ProBNP (last 3 results)  Recent Labs (within last 365 days)    No results for input(s): PROBNP in the last 8760 hours.    CBG:  Last Labs     No results for input(s): GLUCAP in the last 168 hours.    Radiological Exams on Admission:  Imaging Results (Last 48 hours)    Dg Ankle Complete Right  06/16/2015 CLINICAL DATA: Right ankle pain, fell at home last night EXAM: RIGHT ANKLE - COMPLETE 3+  VIEW COMPARISON: None. FINDINGS: Four views of the right ankle submitted. No acute fracture or subluxation. There is diffuse osteopenia. Posterior spur of calcaneus. Ankle mortise is preserved. IMPRESSION: No acute fracture or subluxation. Diffuse osteopenia. Posterior spurring of calcaneus. Electronically Signed By: Natasha Mead M.D. On: 06/16/2015 09:52   Ct Head Wo Contrast  06/16/2015 CLINICAL DATA: Found on floor, with hematoma about the left orbit. Concern for head or cervical spine injury. Initial encounter. EXAM: CT HEAD WITHOUT CONTRAST CT CERVICAL SPINE WITHOUT CONTRAST TECHNIQUE: Multidetector CT imaging of the head and cervical  spine was performed following the standard protocol without intravenous contrast. Multiplanar CT image reconstructions of the cervical spine were also generated. COMPARISON: CT of the head performed 02/26/2015, and MRI of the brain performed 02/27/2015 FINDINGS: CT HEAD FINDINGS There is no evidence of acute infarction, mass lesion, or intra- or extra-axial hemorrhage on CT. Prominence of the ventricles and sulci reflects mild to moderate cortical volume loss. Diffuse periventricular and subcortical white matter change likely reflects small vessel ischemic microangiopathy. The brainstem and fourth ventricle are within normal limits. The basal ganglia are unremarkable in appearance. The cerebral hemispheres demonstrate grossly normal gray-white differentiation. No mass effect or midline shift is seen. There is a fracture through the left orbital floor and lateral wall of the left maxillary sinus, with trace air along the inferior aspect of the left orbit. There is no evidence of herniation of intraorbital contents. Blood is noted partially filling the left maxillary sinus. The visualized portions of the right orbit are within normal limits. The remaining paranasal sinuses and mastoid air cells are well-aerated. Diffuse soft tissue swelling is noted inferior  to the left orbit, with scattered soft tissue air. CT CERVICAL SPINE FINDINGS There is no evidence of fracture or subluxation. Vertebral bodies demonstrate normal height and alignment. Multilevel disc space narrowing is noted along the cervical and upper thoracic spine. There is mild grade 1 retrolisthesis of C4 on C5, and mild grade 1 anterolisthesis of C7 on T1. Prevertebral soft tissues are prominent due to the patient's retropharyngeal common carotid arteries. The thyroid gland is unremarkable in appearance. Interstitial prominence is noted at the lung apices. Scattered calcification is noted at the carotid bifurcations bilaterally. IMPRESSION: 1. No evidence of traumatic intracranial injury. 2. Fracture through the left orbital floor and lateral wall of the left maxillary sinus, with trace air along the inferior aspect of the left orbit. No evidence of herniation of intraorbital contents. Blood noted partially filling the left maxillary sinus. 3. Diffuse soft tissue swelling inferior to the left orbit, with scattered soft tissue air. 4. No evidence of fracture or subluxation along the cervical spine. 5. Mild to moderate cortical volume loss and diffuse small vessel ischemic microangiopathy. 6. Mild degenerative change along the cervical and upper thoracic spine. 7. Interstitial prominence at the lung apices. 8. Scattered calcification at the carotid bifurcations bilaterally. Carotid ultrasound would be helpful for further evaluation, when and as deemed clinically appropriate. These results were called by telephone at the time of interpretation on 06/16/2015 at 12:27 am to Dr. Paula Libra, who verbally acknowledged these results. Electronically Signed By: Roanna Raider M.D. On: 06/16/2015 00:27   Ct Cervical Spine Wo Contrast  06/16/2015 CLINICAL DATA: Found on floor, with hematoma about the left orbit. Concern for head or cervical spine injury. Initial encounter. EXAM: CT HEAD WITHOUT CONTRAST  CT CERVICAL SPINE WITHOUT CONTRAST TECHNIQUE: Multidetector CT imaging of the head and cervical spine was performed following the standard protocol without intravenous contrast. Multiplanar CT image reconstructions of the cervical spine were also generated. COMPARISON: CT of the head performed 02/26/2015, and MRI of the brain performed 02/27/2015 FINDINGS: CT HEAD FINDINGS There is no evidence of acute infarction, mass lesion, or intra- or extra-axial hemorrhage on CT. Prominence of the ventricles and sulci reflects mild to moderate cortical volume loss. Diffuse periventricular and subcortical white matter change likely reflects small vessel ischemic microangiopathy. The brainstem and fourth ventricle are within normal limits. The basal ganglia are unremarkable in appearance. The cerebral hemispheres demonstrate grossly normal gray-white  differentiation. No mass effect or midline shift is seen. There is a fracture through the left orbital floor and lateral wall of the left maxillary sinus, with trace air along the inferior aspect of the left orbit. There is no evidence of herniation of intraorbital contents. Blood is noted partially filling the left maxillary sinus. The visualized portions of the right orbit are within normal limits. The remaining paranasal sinuses and mastoid air cells are well-aerated. Diffuse soft tissue swelling is noted inferior to the left orbit, with scattered soft tissue air. CT CERVICAL SPINE FINDINGS There is no evidence of fracture or subluxation. Vertebral bodies demonstrate normal height and alignment. Multilevel disc space narrowing is noted along the cervical and upper thoracic spine. There is mild grade 1 retrolisthesis of C4 on C5, and mild grade 1 anterolisthesis of C7 on T1. Prevertebral soft tissues are prominent due to the patient's retropharyngeal common carotid arteries. The thyroid gland is unremarkable in appearance. Interstitial prominence is noted at the lung  apices. Scattered calcification is noted at the carotid bifurcations bilaterally. IMPRESSION: 1. No evidence of traumatic intracranial injury. 2. Fracture through the left orbital floor and lateral wall of the left maxillary sinus, with trace air along the inferior aspect of the left orbit. No evidence of herniation of intraorbital contents. Blood noted partially filling the left maxillary sinus. 3. Diffuse soft tissue swelling inferior to the left orbit, with scattered soft tissue air. 4. No evidence of fracture or subluxation along the cervical spine. 5. Mild to moderate cortical volume loss and diffuse small vessel ischemic microangiopathy. 6. Mild degenerative change along the cervical and upper thoracic spine. 7. Interstitial prominence at the lung apices. 8. Scattered calcification at the carotid bifurcations bilaterally. Carotid ultrasound would be helpful for further evaluation, when and as deemed clinically appropriate. These results were called by telephone at the time of interpretation on 06/16/2015 at 12:27 am to Dr. Paula Libra, who verbally acknowledged these results. Electronically Signed By: Roanna Raider M.D. On: 06/16/2015 00:27   Dg Pelvis Portable  06/16/2015 CLINICAL DATA: Pain following fall EXAM: PORTABLE PELVIS 1-2 VIEWS COMPARISON: May 20, 2015 FINDINGS: There is evidence of postoperative change in both proximal femurs with stable appearance in these areas compared to prior study. No acute fracture or dislocation. There is moderate narrowing of both hip joints. There is lumbar levoscoliosis. There is atherosclerotic calcification in the aorta and iliac arteries. IMPRESSION: No acute fracture or dislocation. Postoperative change in both proximal femurs. Moderate narrowing of both hip joints. Electronically Signed By: Bretta Bang III M.D. On: 06/16/2015 01:14   Dg Humerus Left  06/16/2015 CLINICAL DATA: Pain following fall EXAM: LEFT HUMERUS - 2+ VIEW  COMPARISON: None. FINDINGS: Frontal and lateral views were obtained. There is an obliquely oriented fracture of the mid humerus with lateral displacement of the distal fracture fragment with respect proximal fragment. There is a nondisplaced obliquely oriented fracture extending from the lateral proximal humeral metaphysis to the proximal diaphysis more medially. No dislocation. Joint spaces appear intact. IMPRESSION: Displaced fracture, obliquely oriented, mid humerus. Nondisplaced obliquely oriented fracture proximal humerus. No dislocation. Electronically Signed By: Bretta Bang III M.D. On: 06/16/2015 00:08     EKG: Independently reviewed. No acute ischemic changes noted  Assessment/Plan Principal Problem:  Humeral fracture Active Problems:  Fall at home  Essential hypertension, benign  Hypothyroidism  Protein-calorie malnutrition, severe  Dementia   1. Status post fall. Mrs Criado is a pleasant 82 year old female who currently resides in independent living facility. She reportedly  was her usual state of health until yesterday evening were at approximately 9 PM she sustained a fall in her kitchen. Family members reporting that nursing aide had given her her nighttime meds which included Ativan. It appears she had reported having dizziness just prior for her fall. It is possible multiple psychotropic medications may be increasing her risk of having falls. Family members reporting patient having a history of recurrent falls. She is on Seroquel, Ativan, Zoloft, Bentyl and Norco. Will discontinue Ativan and Bentyl while decreasing her dose of Zoloft from 100 mg to 50 mg daily. Will place a physical therapy consultation. Plan to work her up for underlying infectious process as well. Will check a chest x-ray and a urinalysis. She may require placement at skilled nursing facility. 2. Left humeral fracture. Arm sling was placed, will need outpatient orthopedic surgery  follow-up 3. Hypothyroidism. Will check a TSH meanwhile continue her home regimen with levothyroxine 75 g alternating with 88 g 4. Hypertension. Plan to continue Norvasc 5 mg by mouth daily 5. History of dementia with behavioral changes. Will continue her Seroquel at 50 mg by mouth daily, close monitoring for oversedation. 6. History of major depression. Will decrease her salt left os from 100 mg to 50 mg by mouth daily given her history of recurrent falls and concern for polypharmacy. 7. Severe protein calorie malnutrition. Provide protein boost 8. DVT prophylaxis. Lovenox 9. CODE STATUS. DO NOT RESUSCITATE. This was discussed with family members present at bedside   Family Communication: Spoke with family members present at bedside Disposition Plan: Will place patient in overnight observation, consult physical therapy  Time spent: 65 minutes  Jeralyn Bennett [facility] Pager [phone]

## 2015-06-18 ENCOUNTER — Encounter (HOSPITAL_COMMUNITY): Payer: Self-pay | Admitting: Internal Medicine

## 2015-06-18 DIAGNOSIS — E039 Hypothyroidism, unspecified: Secondary | ICD-10-CM

## 2015-06-18 DIAGNOSIS — F0391 Unspecified dementia with behavioral disturbance: Secondary | ICD-10-CM

## 2015-06-18 DIAGNOSIS — S42302A Unspecified fracture of shaft of humerus, left arm, initial encounter for closed fracture: Secondary | ICD-10-CM

## 2015-06-18 DIAGNOSIS — W19XXXD Unspecified fall, subsequent encounter: Secondary | ICD-10-CM

## 2015-06-18 DIAGNOSIS — S0232XA Fracture of orbital floor, left side, initial encounter for closed fracture: Secondary | ICD-10-CM

## 2015-06-18 DIAGNOSIS — E43 Unspecified severe protein-calorie malnutrition: Secondary | ICD-10-CM

## 2015-06-18 DIAGNOSIS — S023XXD Fracture of orbital floor, subsequent encounter for fracture with routine healing: Secondary | ICD-10-CM

## 2015-06-18 DIAGNOSIS — S02401D Maxillary fracture, unspecified, subsequent encounter for fracture with routine healing: Secondary | ICD-10-CM

## 2015-06-18 DIAGNOSIS — I10 Essential (primary) hypertension: Secondary | ICD-10-CM

## 2015-06-18 DIAGNOSIS — S02401A Maxillary fracture, unspecified, initial encounter for closed fracture: Secondary | ICD-10-CM

## 2015-06-18 HISTORY — DX: Maxillary fracture, unspecified side, initial encounter for closed fracture: S02.401A

## 2015-06-18 HISTORY — DX: Fracture of orbital floor, left side, initial encounter for closed fracture: S02.32XA

## 2015-06-18 LAB — URINALYSIS, ROUTINE W REFLEX MICROSCOPIC
BILIRUBIN URINE: NEGATIVE
GLUCOSE, UA: NEGATIVE mg/dL
Ketones, ur: NEGATIVE mg/dL
Leukocytes, UA: NEGATIVE
Nitrite: NEGATIVE
Protein, ur: NEGATIVE mg/dL
SPECIFIC GRAVITY, URINE: 1.019 (ref 1.005–1.030)
UROBILINOGEN UA: 0.2 mg/dL (ref 0.0–1.0)
pH: 5.5 (ref 5.0–8.0)

## 2015-06-18 LAB — CBC
HEMATOCRIT: 27.5 % — AB (ref 36.0–46.0)
HEMOGLOBIN: 9.1 g/dL — AB (ref 12.0–15.0)
MCH: 31.1 pg (ref 26.0–34.0)
MCHC: 33.1 g/dL (ref 30.0–36.0)
MCV: 93.9 fL (ref 78.0–100.0)
Platelets: 166 10*3/uL (ref 150–400)
RBC: 2.93 MIL/uL — AB (ref 3.87–5.11)
RDW: 13 % (ref 11.5–15.5)
WBC: 7.1 10*3/uL (ref 4.0–10.5)

## 2015-06-18 LAB — COMPREHENSIVE METABOLIC PANEL
ALK PHOS: 53 U/L (ref 38–126)
ALT: 12 U/L — ABNORMAL LOW (ref 14–54)
ANION GAP: 5 (ref 5–15)
AST: 23 U/L (ref 15–41)
Albumin: 3.6 g/dL (ref 3.5–5.0)
BILIRUBIN TOTAL: 0.6 mg/dL (ref 0.3–1.2)
BUN: 17 mg/dL (ref 6–20)
CHLORIDE: 106 mmol/L (ref 101–111)
CO2: 28 mmol/L (ref 22–32)
Calcium: 8.8 mg/dL — ABNORMAL LOW (ref 8.9–10.3)
Creatinine, Ser: 0.65 mg/dL (ref 0.44–1.00)
GFR calc Af Amer: >60 mL/min (ref >60)
GFR calc non Af Amer: >60 mL/min (ref >60)
Glucose, Bld: 105 mg/dL — ABNORMAL HIGH (ref 65–99)
Potassium: 3.2 mmol/L — ABNORMAL LOW (ref 3.5–5.1)
Sodium: 139 mmol/L (ref 135–145)
Total Protein: 6 g/dL — ABNORMAL LOW (ref 6.5–8.1)

## 2015-06-18 LAB — RETICULOCYTES
RBC.: 2.93 MIL/uL — AB (ref 3.87–5.11)
Retic Count, Absolute: 41 10*3/uL (ref 19.0–186.0)
Retic Ct Pct: 1.4 % (ref 0.4–3.1)

## 2015-06-18 LAB — URINE MICROSCOPIC-ADD ON

## 2015-06-18 LAB — IRON AND TIBC
Iron: 24 ug/dL — ABNORMAL LOW (ref 28–170)
Saturation Ratios: 12 % (ref 10.4–31.8)
TIBC: 199 ug/dL — AB (ref 250–450)
UIBC: 175 ug/dL

## 2015-06-18 LAB — FERRITIN: FERRITIN: 111 ng/mL (ref 11–307)

## 2015-06-18 LAB — FOLATE: FOLATE: 26.9 ng/mL (ref >5.9)

## 2015-06-18 LAB — VITAMIN B12: VITAMIN B 12: 94 pg/mL — AB (ref 180–914)

## 2015-06-18 MED ORDER — POTASSIUM CHLORIDE CRYS ER 20 MEQ PO TBCR
20.0000 meq | EXTENDED_RELEASE_TABLET | Freq: Two times a day (BID) | ORAL | Status: DC
  Administered 2015-06-18 – 2015-06-20 (×5): 20 meq via ORAL
  Filled 2015-06-18 (×5): qty 1

## 2015-06-18 MED ORDER — LORAZEPAM 0.5 MG PO TABS
0.5000 mg | ORAL_TABLET | Freq: Every day | ORAL | Status: DC
  Administered 2015-06-18 – 2015-06-19 (×2): 0.5 mg via ORAL
  Filled 2015-06-18 (×2): qty 1

## 2015-06-18 MED ORDER — CYANOCOBALAMIN 1000 MCG/ML IJ SOLN
1000.0000 ug | Freq: Once | INTRAMUSCULAR | Status: AC
  Administered 2015-06-18: 1000 ug via INTRAMUSCULAR
  Filled 2015-06-18: qty 1

## 2015-06-18 NOTE — Progress Notes (Addendum)
Progress Note   Jane [name] PJK:[id number] DOB: Dec 22, 1924 DOA: 06/15/2015 PCP: Bufford Spikes, DO   Brief Narrative:   Jane [name] is an 81 y.o. female with a past medical history of hypertension, dementia, arthritis, currently residing at an independent living facility who was admitted to medicine service on 06/16/2015 after suffering from a fall. Imaging studies revealed the presence of left humerus fracture. She was also found to have fracture through the left orbital floor and lateral wall of the left maxillary sinus. She was admitted for pain management and physical therapy consultation. Labs were significant for a downward trend in her hemoglobin from 11.2 on admission to 8.6 on 06/17/2015. Nursing staff did not report GI bleed. Stool for guaiac was requested.   Assessment/Plan:   Principal Problem:   Fall resulting in Humeral fracture as well as left orbital floor and lateral wall of the left maxillary sinus fractures - Fall likely the result of polypharmacy. Medications have been adjusted. - Arm sling placed for left humeral fracture, will follow-up as an outpatient. - Discussed with ENT (Dr. Annalee Genta) regarding facial fractures. Films reviewed. No surgical intervention needed. - PT/OT evaluations ordered.  Will be going to SNF.  Active Problems:   Hypokalemia - Start on supplementation.    Normocytic anemia / B12 deficiency - Heme checking stools. - Anemia panel sent: B12 deficient. Iron low, TIBC low, ferritin WNL. - We'll give 1000 g of B-12 by injection now and recommend monthly injections as an outpatient.    Essential hypertension, benign - Blood pressures are stable, continue Norvasc 5 mg by mouth daily.    Hypothyroidism - Her TSH is mildly suppressed at 0.329. - For now I will continue her current regimen with Synthroid 75 g alternating with 88 g.    Protein-calorie malnutrition, severe - Continue nutritional supplements.    Dementia with behavioral  disturbance - Continue Seroquel at 50 mg by mouth twice a day. Ativan discontinued. - More confused than usual baseline per daughter, check U/A and culture.    DVT Prophylaxis - Lovenox ordered.  Family Communication: [name], daughter, updated at bedside. Disposition Plan: From Independent Living at [facility], will go to [facility] in 2-3 days. Code Status:     Code Status Orders        Start     Ordered   06/16/15 1241  Do not attempt resuscitation (DNR)   Continuous    Question Answer Comment  In the event of cardiac or respiratory ARREST Do not call a "code blue"   In the event of cardiac or respiratory ARREST Do not perform Intubation, CPR, defibrillation or ACLS   In the event of cardiac or respiratory ARREST Use medication by any route, position, wound care, and other measures to relive pain and suffering. May use oxygen, suction and manual treatment of airway obstruction as needed for comfort.      06/16/15 1240    Advance Directive Documentation        Most Recent Value   Type of Advance Directive  Out of facility DNR (pink MOST or yellow form)   Pre-existing out of facility DNR order (yellow form or pink MOST form)  Yellow form placed in chart (order not valid for inpatient use)   "MOST" Form in Place?          IV Access:    Peripheral IV   Procedures and diagnostic studies:   Dg Lumbar Spine Complete  05/20/2015   CLINICAL DATA:  Recent falls with pain  EXAM: LUMBAR SPINE - COMPLETE 4+ VIEW  COMPARISON:  June 27, 2014  FINDINGS: Frontal, lateral, spot lumbosacral lateral, and bilateral oblique views were obtained. There are 5 non-rib-bearing lumbar type vertebral bodies. There is lumbar levoscoliosis with a rotatory component. There is no fracture. There is 4 mm of anterolisthesis of L4 on L5, stable. No other spondylolisthesis. There is marked disc space narrowing at L4-5. There is moderate disc space narrowing at all other levels. There is facet  osteoarthritic change at L4-5 and L5-S1 bilaterally. There is extensive atherosclerotic calcification in the aorta and common iliac arteries. Postoperative changes noted in each proximal femur.  IMPRESSION: Multilevel osteoarthritic change, essentially stable. No fracture. Stable mild spondylolisthesis of L4-5. Stable scoliosis. Atherosclerotic change noted.   Electronically Signed   By: Bretta Bang III M.D.   On: 05/20/2015 18:18   Dg Ankle Complete Right  06/16/2015   CLINICAL DATA:  Right ankle pain, fell at home last night  EXAM: RIGHT ANKLE - COMPLETE 3+ VIEW  COMPARISON:  None.  FINDINGS: Four views of the right ankle submitted. No acute fracture or subluxation. There is diffuse osteopenia. Posterior spur of calcaneus. Ankle mortise is preserved.  IMPRESSION: No acute fracture or subluxation. Diffuse osteopenia. Posterior spurring of calcaneus.   Electronically Signed   By: Natasha Mead M.D.   On: 06/16/2015 09:52   Ct Head Wo Contrast  06/16/2015   CLINICAL DATA:  Found on floor, with hematoma about the left orbit. Concern for head or cervical spine injury. Initial encounter.  EXAM: CT HEAD WITHOUT CONTRAST  CT CERVICAL SPINE WITHOUT CONTRAST  TECHNIQUE: Multidetector CT imaging of the head and cervical spine was performed following the standard protocol without intravenous contrast. Multiplanar CT image reconstructions of the cervical spine were also generated.  COMPARISON:  CT of the head performed 02/26/2015, and MRI of the brain performed 02/27/2015  FINDINGS: CT HEAD FINDINGS  There is no evidence of acute infarction, mass lesion, or intra- or extra-axial hemorrhage on CT.  Prominence of the ventricles and sulci reflects mild to moderate cortical volume loss. Diffuse periventricular and subcortical white matter change likely reflects small vessel ischemic microangiopathy.  The brainstem and fourth ventricle are within normal limits. The basal ganglia are unremarkable in appearance. The cerebral  hemispheres demonstrate grossly normal gray-white differentiation. No mass effect or midline shift is seen.  There is a fracture through the left orbital floor and lateral wall of the left maxillary sinus, with trace air along the inferior aspect of the left orbit. There is no evidence of herniation of intraorbital contents. Blood is noted partially filling the left maxillary sinus. The visualized portions of the right orbit are within normal limits. The remaining paranasal sinuses and mastoid air cells are well-aerated. Diffuse soft tissue swelling is noted inferior to the left orbit, with scattered soft tissue air.  CT CERVICAL SPINE FINDINGS  There is no evidence of fracture or subluxation. Vertebral bodies demonstrate normal height and alignment. Multilevel disc space narrowing is noted along the cervical and upper thoracic spine. There is mild grade 1 retrolisthesis of C4 on C5, and mild grade 1 anterolisthesis of C7 on T1. Prevertebral soft tissues are prominent due to the patient's retropharyngeal common carotid arteries.  The thyroid gland is unremarkable in appearance. Interstitial prominence is noted at the lung apices. Scattered calcification is noted at the carotid bifurcations bilaterally.  IMPRESSION: 1. No evidence of traumatic intracranial injury. 2. Fracture through the left orbital  floor and lateral wall of the left maxillary sinus, with trace air along the inferior aspect of the left orbit. No evidence of herniation of intraorbital contents. Blood noted partially filling the left maxillary sinus. 3. Diffuse soft tissue swelling inferior to the left orbit, with scattered soft tissue air. 4. No evidence of fracture or subluxation along the cervical spine. 5. Mild to moderate cortical volume loss and diffuse small vessel ischemic microangiopathy. 6. Mild degenerative change along the cervical and upper thoracic spine. 7. Interstitial prominence at the lung apices. 8. Scattered calcification at the  carotid bifurcations bilaterally. Carotid ultrasound would be helpful for further evaluation, when and as deemed clinically appropriate. These results were called by telephone at the time of interpretation on 06/16/2015 at 12:27 am to Dr. Paula Libra, who verbally acknowledged these results.   Electronically Signed   By: Roanna Raider M.D.   On: 06/16/2015 00:27   Ct Cervical Spine Wo Contrast  06/16/2015   CLINICAL DATA:  Found on floor, with hematoma about the left orbit. Concern for head or cervical spine injury. Initial encounter.  EXAM: CT HEAD WITHOUT CONTRAST  CT CERVICAL SPINE WITHOUT CONTRAST  TECHNIQUE: Multidetector CT imaging of the head and cervical spine was performed following the standard protocol without intravenous contrast. Multiplanar CT image reconstructions of the cervical spine were also generated.  COMPARISON:  CT of the head performed 02/26/2015, and MRI of the brain performed 02/27/2015  FINDINGS: CT HEAD FINDINGS  There is no evidence of acute infarction, mass lesion, or intra- or extra-axial hemorrhage on CT.  Prominence of the ventricles and sulci reflects mild to moderate cortical volume loss. Diffuse periventricular and subcortical white matter change likely reflects small vessel ischemic microangiopathy.  The brainstem and fourth ventricle are within normal limits. The basal ganglia are unremarkable in appearance. The cerebral hemispheres demonstrate grossly normal gray-white differentiation. No mass effect or midline shift is seen.  There is a fracture through the left orbital floor and lateral wall of the left maxillary sinus, with trace air along the inferior aspect of the left orbit. There is no evidence of herniation of intraorbital contents. Blood is noted partially filling the left maxillary sinus. The visualized portions of the right orbit are within normal limits. The remaining paranasal sinuses and mastoid air cells are well-aerated. Diffuse soft tissue swelling is noted  inferior to the left orbit, with scattered soft tissue air.  CT CERVICAL SPINE FINDINGS  There is no evidence of fracture or subluxation. Vertebral bodies demonstrate normal height and alignment. Multilevel disc space narrowing is noted along the cervical and upper thoracic spine. There is mild grade 1 retrolisthesis of C4 on C5, and mild grade 1 anterolisthesis of C7 on T1. Prevertebral soft tissues are prominent due to the patient's retropharyngeal common carotid arteries.  The thyroid gland is unremarkable in appearance. Interstitial prominence is noted at the lung apices. Scattered calcification is noted at the carotid bifurcations bilaterally.  IMPRESSION: 1. No evidence of traumatic intracranial injury. 2. Fracture through the left orbital floor and lateral wall of the left maxillary sinus, with trace air along the inferior aspect of the left orbit. No evidence of herniation of intraorbital contents. Blood noted partially filling the left maxillary sinus. 3. Diffuse soft tissue swelling inferior to the left orbit, with scattered soft tissue air. 4. No evidence of fracture or subluxation along the cervical spine. 5. Mild to moderate cortical volume loss and diffuse small vessel ischemic microangiopathy. 6. Mild degenerative change along the  cervical and upper thoracic spine. 7. Interstitial prominence at the lung apices. 8. Scattered calcification at the carotid bifurcations bilaterally. Carotid ultrasound would be helpful for further evaluation, when and as deemed clinically appropriate. These results were called by telephone at the time of interpretation on 06/16/2015 at 12:27 am to Dr. Paula Libra, who verbally acknowledged these results.   Electronically Signed   By: Roanna Raider M.D.   On: 06/16/2015 00:27   Dg Pelvis Portable  06/16/2015   CLINICAL DATA:  Pain following fall  EXAM: PORTABLE PELVIS 1-2 VIEWS  COMPARISON:  May 20, 2015  FINDINGS: There is evidence of postoperative change in both  proximal femurs with stable appearance in these areas compared to prior study. No acute fracture or dislocation. There is moderate narrowing of both hip joints. There is lumbar levoscoliosis. There is atherosclerotic calcification in the aorta and iliac arteries.  IMPRESSION: No acute fracture or dislocation. Postoperative change in both proximal femurs. Moderate narrowing of both hip joints.   Electronically Signed   By: Bretta Bang III M.D.   On: 06/16/2015 01:14   Dg Chest Port 1 View  06/16/2015   CLINICAL DATA:  Mid chest discomfort.  Left humerus fracture.  EXAM: PORTABLE CHEST - 1 VIEW  COMPARISON:  02/26/2015  FINDINGS: Again noted is the fracture involving the left humerus. There is a well-defined density in the left lower chest which is probably related to overlying soft tissues. Otherwise, the lungs are clear. Again noted are multiple costal calcifications. Evidence for hyperinflation and underlying emphysematous changes.  IMPRESSION: Density overlying the left lower chest may represent overlying soft tissues but indeterminate. Otherwise, no focal lung disease. Consider follow-up of this area with a two view chest examination.  Hyperinflation with emphysematous changes.   Electronically Signed   By: Richarda Overlie M.D.   On: 06/16/2015 12:51   Dg Shoulder Left  05/20/2015   CLINICAL DATA:  Multiple recent falls with left shoulder pain, initial encounter  EXAM: LEFT SHOULDER - 2+ VIEW  COMPARISON:  None.  FINDINGS: There is no evidence of fracture or dislocation. There is no evidence of arthropathy or other focal bone abnormality. Soft tissues are unremarkable.  IMPRESSION: No acute abnormality noted.   Electronically Signed   By: Alcide Clever M.D.   On: 05/20/2015 18:17   Dg Humerus Left  06/16/2015   CLINICAL DATA:  Pain following fall  EXAM: LEFT HUMERUS - 2+ VIEW  COMPARISON:  None.  FINDINGS: Frontal and lateral views were obtained. There is an obliquely oriented fracture of the mid humerus  with lateral displacement of the distal fracture fragment with respect proximal fragment. There is a nondisplaced obliquely oriented fracture extending from the lateral proximal humeral metaphysis to the proximal diaphysis more medially. No dislocation. Joint spaces appear intact.  IMPRESSION: Displaced fracture, obliquely oriented, mid humerus. Nondisplaced obliquely oriented fracture proximal humerus. No dislocation.   Electronically Signed   By: Bretta Bang III M.D.   On: 06/16/2015 00:08   Dg Foot Complete Left  05/20/2015   CLINICAL DATA:  Two falls in past 2 days  EXAM: LEFT FOOT - COMPLETE 3+ VIEW  COMPARISON:  None.  FINDINGS: Frontal, oblique, and lateral views obtained. There is soft tissue swelling over the dorsum of the foot. Bones are diffusely osteoporotic. There is no fracture or dislocation. There is moderate narrowing of all MTP, PIP, and DIP joints. No erosive change.  IMPRESSION: No fracture or dislocation apparent. Soft tissue swelling over the  dorsum of the foot. Narrowing of multiple distal joints. Bones diffusely osteoporotic.   Electronically Signed   By: Bretta Bang III M.D.   On: 05/20/2015 20:02   Dg Hip Unilat With Pelvis 2-3 Views Left  05/20/2015   CLINICAL DATA:  Left hip pain  EXAM: DG HIP (WITH OR WITHOUT PELVIS) 2-3V LEFT  COMPARISON:  None.  FINDINGS: Status post ORIF of an subtrochanteric left hip fracture. Residual fracture deformity.  Status post ORIF of a prior right hip fracture, without deformity.  No evidence of acute fracture or dislocation.  Bilateral hip joint spaces are symmetric.  Visualized bony pelvis appears intact.  Degenerative changes of the lower lumbar spine.  Vascular calcifications.  IMPRESSION: Status post ORIF of the bilateral hips.  No evidence of acute fracture or dislocation.   Electronically Signed   By: Charline Bills M.D.   On: 05/20/2015 18:06     Medical Consultants:    None.  Anti-Infectives:    None.  Subjective:     Jane [name] reports some facial and left arm discomfort.  Arm hurts with any movement.  No BM today.  Daughter reports that she has been confused since the fall.  Objective:    Filed Vitals:   06/17/15 0438 06/17/15 1300 06/17/15 2042 06/18/15 0519  BP: 124/50 120/40 148/69 159/84  Pulse: 92 98 102 90  Temp: 98.1 F (36.7 C) 98.4 F (36.9 C) 99.2 F (37.3 C) 98.1 F (36.7 C)  TempSrc: Oral Oral Oral Oral  Resp: 20 22 20 20   Height:      Weight:      SpO2: 98% 91% 93% 99%    Intake/Output Summary (Last 24 hours) at 06/18/15 1346 Last data filed at 06/18/15 0937  Gross per 24 hour  Intake   1400 ml  Output   1100 ml  Net    300 ml    Exam: Gen:  NAD, confused Head: Periorbital bruising on right Cardiovascular:  RRR, No M/R/G Respiratory:  Lungs CTAB Gastrointestinal:  Abdomen soft, NT/ND, + BS Extremities:  Trace edema   Data Reviewed:    Labs: Basic Metabolic Panel:  Recent Labs Lab 06/15/15 2348 06/16/15 1510 06/17/15 0540 06/18/15 0956  NA 139  --  140 139  K 3.7  --  3.7 3.2*  CL 106  --  108 106  CO2 24  --  22 28  GLUCOSE 138*  --  109* 105*  BUN 23*  --  16 17  CREATININE 0.68 0.83 0.65 0.65  CALCIUM 9.0  --  8.7* 8.8*   GFR Estimated Creatinine Clearance: 37.4 mL/min (by C-G formula based on Cr of 0.65). Liver Function Tests:  Recent Labs Lab 06/18/15 0956  AST 23  ALT 12*  ALKPHOS 53  BILITOT 0.6  PROT 6.0*  ALBUMIN 3.6   CBC:  Recent Labs Lab 06/15/15 2348 06/16/15 1510 06/17/15 0540 06/18/15 0956  WBC 12.3* 8.8 6.1 7.1  NEUTROABS 10.6*  --   --   --   HGB 11.2* 10.0* 8.6* 9.1*  HCT 34.4* 30.3* 26.4* 27.5*  MCV 93.0 94.4 94.3 93.9  PLT 155 189 163 166   Thyroid function studies:  Recent Labs  06/16/15 1510  TSH 0.329*   Anemia work up:  Recent Labs  06/18/15 0956  VITAMINB12 94*  FERRITIN 111  TIBC 199*  IRON 24*  RETICCTPCT 1.4   Microbiology No results found for this or any previous visit (from  the past 240 hour(s)).  Medications:   . amLODipine  5 mg Oral Daily  . calcium-vitamin D  1 tablet Oral Daily  . enoxaparin (LOVENOX) injection  40 mg Subcutaneous Q24H  . latanoprost  1 drop Both Eyes QHS  . levothyroxine  75 mcg Oral Q48H  . levothyroxine  88 mcg Oral Q48H  . mirabegron ER  50 mg Oral Daily  . multivitamin-lutein  1 capsule Oral Daily  . polyethylene glycol  17 g Oral Daily  . pravastatin  20 mg Oral q1800  . QUEtiapine  50 mg Oral Daily  . sertraline  50 mg Oral Daily  . sodium chloride  3 mL Intravenous Q12H   Continuous Infusions: . sodium chloride 75 mL/hr at 06/18/15 0518    Time spent: 35 minutes with > 50% of time discussing current diagnostic test results, clinical impression and plan of care.   LOS: 2 days   Meron Bocchino  [facility] Pager [phone]. If unable to reach me by pager, please call my cell phone at [phone].  *Please refer to [email or web address], password [email or web address] to get updated schedule on who will round on this patient, as hospitalists switch teams weekly. If 7PM-7AM, please contact night-coverage at [email or web address], password [email or web address] for any overnight needs.  06/18/2015, 1:46 PM

## 2015-06-18 NOTE — Clinical Social Work Placement (Signed)
Patient to discharge to [facility] when stable, anticipating discharge Sunday, 8/14. Tresa Endo at [facility] aware. If ready over the weekend, please contact weekend CSW, Rene Kocher (ph#: [phone]) to facilitate discharge.      Lincoln Maxin, LCSW [facility] Clinical Social Worker cell #: [phone]   CLINICAL SOCIAL WORK PLACEMENT  NOTE  Date:  06/18/2015  Patient Details  Name: Jane [name] MRN: [id number] Date of Birth: [birth year]  Clinical Social Work is seeking post-discharge placement for this patient at the Skilled  Nursing Facility level of care (*CSW will initial, date and re-position this form in  chart as items are completed):  Yes   Patient/family provided with [place] Clinical Social Work Department's list of facilities offering this level of care within the geographic area requested by the patient (or if unable, by the patient's family).  Yes   Patient/family informed of their freedom to choose among providers that offer the needed level of care, that participate in Medicare, Medicaid or managed care program needed by the patient, have an available bed and are willing to accept the patient.  Yes   Patient/family informed of 's ownership interest in [facility] and [facility], as well as of the fact that they are under no obligation to receive care at these facilities.  PASRR submitted to EDS on 06/17/15     PASRR number received on 06/17/15     Existing PASRR number confirmed on       FL2 transmitted to all facilities in geographic area requested by pt/family on 06/17/15     FL2 transmitted to all facilities within larger geographic area on       Patient informed that his/her managed care company has contracts with or will negotiate with certain facilities, including the following:        Yes   Patient/family informed of bed offers received.  Patient chooses bed at [facility]     Physician recommends and patient  chooses bed at      Patient to be transferred to [facility] on  .  Patient to be transferred to facility by       Patient family notified on   of transfer.  Name of family member notified:        PHYSICIAN       Additional Comment:    _______________________________________________ Arlyss Repress, LCSW 06/18/2015, 11:45 AM

## 2015-06-18 NOTE — Care Management Important Message (Signed)
Important Message  Patient Details  Name: Kaislee Chao MRN: [id number] Date of Birth: [birth year]   Medicare Important Message Given:  Yes-second notification given    Haskell Flirt 06/18/2015, 10:30 AMImportant Message  Patient Details  Name: Nithya Meriweather MRN: [id number] Date of Birth: [birth year]   Medicare Important Message Given:  Yes-second notification given    Haskell Flirt 06/18/2015, 10:30 AM

## 2015-06-18 NOTE — Care Management Note (Signed)
Case Management Note  Patient Details  Name: Jane [name] MRN: [id number] Date of Birth: December 31, 1924  Subjective/Objective:    PT-SNF.                Action/Plan:d/c plan SNF.   Expected Discharge Date:   (UNKNOWN)               Expected Discharge Plan:  Skilled Nursing Facility  In-House Referral:  Clinical Social Work  Discharge planning Services  CM Consult  Post Acute Care Choice:  Home Health ([place]-HHRN/PT/aide.) Choice offered to:     DME Arranged:    DME Agency:     HH Arranged:    HH Agency:     Status of Service:  In process, will continue to follow  Medicare Important Message Given:  Yes-second notification given Date Medicare IM Given:    Medicare IM give by:    Date Additional Medicare IM Given:    Additional Medicare Important Message give by:     If discussed at Long Length of Stay Meetings, dates discussed:    Additional Comments:  Lanier Clam, RN 06/18/2015, 2:48 PM

## 2015-06-18 NOTE — Evaluation (Addendum)
Occupational Therapy Evaluation Patient Details Name: Jane [name] MRN: [id number] DOB: July 29, 1925 Today's Date: 06/18/2015    History of Present Illness 81 yo female admitted with L prox humeral fx after fall at home. Hx of dementia, multiple falls, bil hip IM nail in 2015. [facility] is from [facility].    Clinical Impression   Pt was admitted for the above. She was mod I with adls prior to admission; currently she needs min to mod A for SPT and up to total A for LB adls.  Will follow in acute with min a to mod A level goals in acute. Pt will benefit from continued OT at a SNF.    Follow Up Recommendations  SNF    Equipment Recommendations  3 in 1 bedside comode    Recommendations for Other Services       Precautions / Restrictions Precautions Precautions: Fall;Shoulder Type of Shoulder Precautions: pt has a displaced oblique mid humerus fx and nondisplaced proximal humerus fx Shoulder Interventions: Shoulder sling/immobilizer Restrictions LUE Weight Bearing: Non weight bearing      Mobility Bed Mobility           Sit to supine: Min assist   General bed mobility comments: increased time.  guided trunk and pt was able to lift legs  Transfers   Equipment used:  (hand held assist; armrest of recliner)   Sit to Stand: Min assist              Balance           Standing balance support: During functional activity;Single extremity supported Standing balance-Leahy Scale: Poor                              ADL Overall ADL's : Needs assistance/impaired     Grooming: Wash/dry hands;Moderate assistance   Upper Body Bathing: Moderate assistance;Sitting   Lower Body Bathing: Maximal assistance;Sit to/from stand   Upper Body Dressing : Maximal assistance;Sitting   Lower Body Dressing: Total assistance;Sit to/from stand   Toilet Transfer: Minimal assistance;Moderate assistance;Stand-pivot;BSC (mod A SPT to L side; min A to right:  see comments  below)   Toileting- Clothing Manipulation and Hygiene: Total assistance;Sit to/from stand         General ADL Comments: daughter assisted pt with cleaning dentures and placing them back in mouth.  Performed SPT to [facility] going to L side. pt held onto armrest of chair and needed A to turn completely. When performing transfer back to bed at 180 degree angle, placed recliner between surfaces so that she could use long armrest and she needed min A for pivotal steps.  Pt is very talkative and distracts herself from activities.  Did follow one step commands consistently     Vision  h/o macular degeneration   Perception     Praxis      Pertinent Vitals/Pain Faces Pain Scale: Hurts a little bit Pain Location: LUE Pain Intervention(s): Limited activity within patient's tolerance;Monitored during session;Premedicated before session;Repositioned     Hand Dominance     Extremity/Trunk Assessment Upper Extremity Assessment LUE Deficits / Details: splinted and in sling           Communication Communication Communication: HOH   Cognition Arousal/Alertness: Awake/alert Behavior During Therapy: WFL for tasks assessed/performed Overall Cognitive Status: History of cognitive impairments - at baseline  General Comments       Exercises       Shoulder Instructions      Home Living Family/patient expects to be discharged to:: Skilled nursing facility                                 Additional Comments: plans to go to [facility] where she had rehab for hip fxs      Prior Functioning/Environment Level of Independence: Independent with assistive device(s)        Comments: was in independent living    OT Diagnosis: Acute pain   OT Problem List: Decreased strength;Decreased activity tolerance;Impaired balance (sitting and/or standing);Impaired vision/perception;Decreased knowledge of use of DME or AE;Decreased cognition;Decreased safety  awareness;Pain;Impaired UE functional use   OT Treatment/Interventions: Self-care/ADL training;DME and/or AE instruction;Patient/family education;Balance training;Cognitive remediation/compensation    OT Goals(Current goals can be found in the care plan section) Acute Rehab OT Goals Patient Stated Goal: none stated; agreeable to OT OT Goal Formulation: Patient unable to participate in goal setting (daughter stepped out of room) Time For Goal Achievement: 06/25/15 Potential to Achieve Goals: Good ADL Goals Pt Will Perform Grooming: with min assist;sitting Pt Will Perform Lower Body Bathing: with min assist;sit to/from stand Pt Will Perform Lower Body Dressing: with mod assist;sit to/from stand Pt Will Transfer to Toilet: with min assist;bedside commode;stand pivot transfer (to both sides) Pt Will Perform Toileting - Clothing Manipulation and hygiene: with min assist;sit to/from stand  OT Frequency: Min 2X/week   Barriers to D/C:            Co-evaluation              End of Session    Activity Tolerance: Patient tolerated treatment well Patient left: in bed;with call bell/phone within reach;with bed alarm set   Time: 2426-8341 OT Time Calculation (min): 26 min Charges:  OT General Charges $OT Visit: 1 Procedure OT Evaluation $Initial OT Evaluation Tier I: 1 Procedure OT Treatments $Self Care/Home Management : 8-22 mins G-Codes:    Jodee Wagenaar 07/12/2015, 3:50 PM  Marica Otter, OTR/L [phone] 12-Jul-2015

## 2015-06-19 ENCOUNTER — Encounter (HOSPITAL_COMMUNITY): Payer: Self-pay | Admitting: Internal Medicine

## 2015-06-19 DIAGNOSIS — E538 Deficiency of other specified B group vitamins: Secondary | ICD-10-CM | POA: Diagnosis present

## 2015-06-19 HISTORY — DX: Deficiency of other specified B group vitamins: E53.8

## 2015-06-19 LAB — BASIC METABOLIC PANEL
ANION GAP: 7 (ref 5–15)
BUN: 15 mg/dL (ref 6–20)
CHLORIDE: 110 mmol/L (ref 101–111)
CO2: 24 mmol/L (ref 22–32)
Calcium: 9 mg/dL (ref 8.9–10.3)
Creatinine, Ser: 0.62 mg/dL (ref 0.44–1.00)
GFR calc non Af Amer: >60 mL/min (ref >60)
GLUCOSE: 92 mg/dL (ref 65–99)
Potassium: 3.9 mmol/L (ref 3.5–5.1)
SODIUM: 141 mmol/L (ref 135–145)

## 2015-06-19 LAB — CBC
HCT: 25.8 % — ABNORMAL LOW (ref 36.0–46.0)
HEMOGLOBIN: 8.7 g/dL — AB (ref 12.0–15.0)
MCH: 31.5 pg (ref 26.0–34.0)
MCHC: 33.7 g/dL (ref 30.0–36.0)
MCV: 93.5 fL (ref 78.0–100.0)
PLATELETS: 167 10*3/uL (ref 150–400)
RBC: 2.76 MIL/uL — ABNORMAL LOW (ref 3.87–5.11)
RDW: 13.2 % (ref 11.5–15.5)
WBC: 6 10*3/uL (ref 4.0–10.5)

## 2015-06-19 LAB — OCCULT BLOOD X 1 CARD TO LAB, STOOL: Fecal Occult Bld: NEGATIVE

## 2015-06-19 NOTE — Progress Notes (Signed)
Progress Note   Jane [name] HCW:[id number] DOB: January 15, 1925 DOA: 06/15/2015 PCP: Bufford Spikes, DO   Brief Narrative:   Jane [name] is an 81 y.o. female with a past medical history of hypertension, dementia, arthritis, currently residing at an independent living facility who was admitted to medicine service on 06/16/2015 after suffering from a fall. Imaging studies revealed the presence of left humerus fracture. She was also found to have fracture through the left orbital floor and lateral wall of the left maxillary sinus. She was admitted for pain management and physical therapy consultation. Labs were significant for a downward trend in her hemoglobin from 11.2 on admission to 8.6 on 06/17/2015. Nursing staff did not report GI bleed. Stool for guaiac was requested.   Assessment/Plan:   Principal Problem:   Fall resulting in Humeral fracture as well as left orbital floor and lateral wall of the left maxillary sinus fractures - Fall likely the result of polypharmacy. Medications have been adjusted. - Arm sling placed for left humeral fracture, will follow-up as an outpatient. - Discussed with ENT (Dr. Annalee Genta) regarding facial fractures. Films reviewed. No surgical intervention needed. - PT/OT evaluations ordered.  Will be going to SNF.  Active Problems:   Hypokalemia - Started on routine supplementation 06/18/15.    Normocytic anemia / B12 deficiency - Heme checking stools. - Anemia panel sent: B12 deficient. Iron low, TIBC low, ferritin WNL. - Given 1000 g of B-12 by injection 06/18/15 and recommend monthly injections as an outpatient.    Essential hypertension, benign - Blood pressures are stable, continue Norvasc 5 mg by mouth daily.    Hypothyroidism - Her TSH is mildly suppressed at 0.329. - For now I will continue her current regimen with Synthroid 75 g alternating with 88 g.    Protein-calorie malnutrition, severe - Continue nutritional supplements.    Dementia  with behavioral disturbance - Continue Seroquel at 50 mg by mouth daily. Ativan discontinued. - More confused than usual baseline per daughter. - Urinalysis negative for nitrites and leukocytes.    DVT Prophylaxis - Lovenox ordered.  Family Communication: Jane [name], daughter, updated at bedside 06/18/15. No family at the bedside. Disposition Plan: From Independent Living at [facility], will go to [facility] in 2-3 days. Code Status:     Code Status Orders        Start     Ordered   06/16/15 1241  Do not attempt resuscitation (DNR)   Continuous    Question Answer Comment  In the event of cardiac or respiratory ARREST Do not call a "code blue"   In the event of cardiac or respiratory ARREST Do not perform Intubation, CPR, defibrillation or ACLS   In the event of cardiac or respiratory ARREST Use medication by any route, position, wound care, and other measures to relive pain and suffering. May use oxygen, suction and manual treatment of airway obstruction as needed for comfort.      06/16/15 1240    Advance Directive Documentation        Most Recent Value   Type of Advance Directive  Out of facility DNR (pink MOST or yellow form)   Pre-existing out of facility DNR order (yellow form or pink MOST form)  Yellow form placed in chart (order not valid for inpatient use)   "MOST" Form in Place?          IV Access:    Peripheral IV   Procedures and diagnostic studies:   Dg Lumbar Spine  Complete  05/20/2015   CLINICAL DATA:  Recent falls with pain  EXAM: LUMBAR SPINE - COMPLETE 4+ VIEW  COMPARISON:  June 27, 2014  FINDINGS: Frontal, lateral, spot lumbosacral lateral, and bilateral oblique views were obtained. There are 5 non-rib-bearing lumbar type vertebral bodies. There is lumbar levoscoliosis with a rotatory component. There is no fracture. There is 4 mm of anterolisthesis of L4 on L5, stable. No other spondylolisthesis. There is marked disc space narrowing at L4-5. There is  moderate disc space narrowing at all other levels. There is facet osteoarthritic change at L4-5 and L5-S1 bilaterally. There is extensive atherosclerotic calcification in the aorta and common iliac arteries. Postoperative changes noted in each proximal femur.  IMPRESSION: Multilevel osteoarthritic change, essentially stable. No fracture. Stable mild spondylolisthesis of L4-5. Stable scoliosis. Atherosclerotic change noted.   Electronically Signed   By: Bretta Bang III M.D.   On: 05/20/2015 18:18   Dg Ankle Complete Right  06/16/2015   CLINICAL DATA:  Right ankle pain, fell at home last night  EXAM: RIGHT ANKLE - COMPLETE 3+ VIEW  COMPARISON:  None.  FINDINGS: Four views of the right ankle submitted. No acute fracture or subluxation. There is diffuse osteopenia. Posterior spur of calcaneus. Ankle mortise is preserved.  IMPRESSION: No acute fracture or subluxation. Diffuse osteopenia. Posterior spurring of calcaneus.   Electronically Signed   By: Natasha Mead M.D.   On: 06/16/2015 09:52   Ct Head Wo Contrast  06/16/2015   CLINICAL DATA:  Found on floor, with hematoma about the left orbit. Concern for head or cervical spine injury. Initial encounter.  EXAM: CT HEAD WITHOUT CONTRAST  CT CERVICAL SPINE WITHOUT CONTRAST  TECHNIQUE: Multidetector CT imaging of the head and cervical spine was performed following the standard protocol without intravenous contrast. Multiplanar CT image reconstructions of the cervical spine were also generated.  COMPARISON:  CT of the head performed 02/26/2015, and MRI of the brain performed 02/27/2015  FINDINGS: CT HEAD FINDINGS  There is no evidence of acute infarction, mass lesion, or intra- or extra-axial hemorrhage on CT.  Prominence of the ventricles and sulci reflects mild to moderate cortical volume loss. Diffuse periventricular and subcortical white matter change likely reflects small vessel ischemic microangiopathy.  The brainstem and fourth ventricle are within normal  limits. The basal ganglia are unremarkable in appearance. The cerebral hemispheres demonstrate grossly normal gray-white differentiation. No mass effect or midline shift is seen.  There is a fracture through the left orbital floor and lateral wall of the left maxillary sinus, with trace air along the inferior aspect of the left orbit. There is no evidence of herniation of intraorbital contents. Blood is noted partially filling the left maxillary sinus. The visualized portions of the right orbit are within normal limits. The remaining paranasal sinuses and mastoid air cells are well-aerated. Diffuse soft tissue swelling is noted inferior to the left orbit, with scattered soft tissue air.  CT CERVICAL SPINE FINDINGS  There is no evidence of fracture or subluxation. Vertebral bodies demonstrate normal height and alignment. Multilevel disc space narrowing is noted along the cervical and upper thoracic spine. There is mild grade 1 retrolisthesis of C4 on C5, and mild grade 1 anterolisthesis of C7 on T1. Prevertebral soft tissues are prominent due to the patient's retropharyngeal common carotid arteries.  The thyroid gland is unremarkable in appearance. Interstitial prominence is noted at the lung apices. Scattered calcification is noted at the carotid bifurcations bilaterally.  IMPRESSION: 1. No evidence of traumatic  intracranial injury. 2. Fracture through the left orbital floor and lateral wall of the left maxillary sinus, with trace air along the inferior aspect of the left orbit. No evidence of herniation of intraorbital contents. Blood noted partially filling the left maxillary sinus. 3. Diffuse soft tissue swelling inferior to the left orbit, with scattered soft tissue air. 4. No evidence of fracture or subluxation along the cervical spine. 5. Mild to moderate cortical volume loss and diffuse small vessel ischemic microangiopathy. 6. Mild degenerative change along the cervical and upper thoracic spine. 7.  Interstitial prominence at the lung apices. 8. Scattered calcification at the carotid bifurcations bilaterally. Carotid ultrasound would be helpful for further evaluation, when and as deemed clinically appropriate. These results were called by telephone at the time of interpretation on 06/16/2015 at 12:27 am to Dr. Paula Libra, who verbally acknowledged these results.   Electronically Signed   By: Roanna Raider M.D.   On: 06/16/2015 00:27   Ct Cervical Spine Wo Contrast  06/16/2015   CLINICAL DATA:  Found on floor, with hematoma about the left orbit. Concern for head or cervical spine injury. Initial encounter.  EXAM: CT HEAD WITHOUT CONTRAST  CT CERVICAL SPINE WITHOUT CONTRAST  TECHNIQUE: Multidetector CT imaging of the head and cervical spine was performed following the standard protocol without intravenous contrast. Multiplanar CT image reconstructions of the cervical spine were also generated.  COMPARISON:  CT of the head performed 02/26/2015, and MRI of the brain performed 02/27/2015  FINDINGS: CT HEAD FINDINGS  There is no evidence of acute infarction, mass lesion, or intra- or extra-axial hemorrhage on CT.  Prominence of the ventricles and sulci reflects mild to moderate cortical volume loss. Diffuse periventricular and subcortical white matter change likely reflects small vessel ischemic microangiopathy.  The brainstem and fourth ventricle are within normal limits. The basal ganglia are unremarkable in appearance. The cerebral hemispheres demonstrate grossly normal gray-white differentiation. No mass effect or midline shift is seen.  There is a fracture through the left orbital floor and lateral wall of the left maxillary sinus, with trace air along the inferior aspect of the left orbit. There is no evidence of herniation of intraorbital contents. Blood is noted partially filling the left maxillary sinus. The visualized portions of the right orbit are within normal limits. The remaining paranasal sinuses  and mastoid air cells are well-aerated. Diffuse soft tissue swelling is noted inferior to the left orbit, with scattered soft tissue air.  CT CERVICAL SPINE FINDINGS  There is no evidence of fracture or subluxation. Vertebral bodies demonstrate normal height and alignment. Multilevel disc space narrowing is noted along the cervical and upper thoracic spine. There is mild grade 1 retrolisthesis of C4 on C5, and mild grade 1 anterolisthesis of C7 on T1. Prevertebral soft tissues are prominent due to the patient's retropharyngeal common carotid arteries.  The thyroid gland is unremarkable in appearance. Interstitial prominence is noted at the lung apices. Scattered calcification is noted at the carotid bifurcations bilaterally.  IMPRESSION: 1. No evidence of traumatic intracranial injury. 2. Fracture through the left orbital floor and lateral wall of the left maxillary sinus, with trace air along the inferior aspect of the left orbit. No evidence of herniation of intraorbital contents. Blood noted partially filling the left maxillary sinus. 3. Diffuse soft tissue swelling inferior to the left orbit, with scattered soft tissue air. 4. No evidence of fracture or subluxation along the cervical spine. 5. Mild to moderate cortical volume loss and diffuse small vessel  ischemic microangiopathy. 6. Mild degenerative change along the cervical and upper thoracic spine. 7. Interstitial prominence at the lung apices. 8. Scattered calcification at the carotid bifurcations bilaterally. Carotid ultrasound would be helpful for further evaluation, when and as deemed clinically appropriate. These results were called by telephone at the time of interpretation on 06/16/2015 at 12:27 am to Dr. Paula Libra, who verbally acknowledged these results.   Electronically Signed   By: Roanna Raider M.D.   On: 06/16/2015 00:27   Dg Pelvis Portable  06/16/2015   CLINICAL DATA:  Pain following fall  EXAM: PORTABLE PELVIS 1-2 VIEWS  COMPARISON:   May 20, 2015  FINDINGS: There is evidence of postoperative change in both proximal femurs with stable appearance in these areas compared to prior study. No acute fracture or dislocation. There is moderate narrowing of both hip joints. There is lumbar levoscoliosis. There is atherosclerotic calcification in the aorta and iliac arteries.  IMPRESSION: No acute fracture or dislocation. Postoperative change in both proximal femurs. Moderate narrowing of both hip joints.   Electronically Signed   By: Bretta Bang III M.D.   On: 06/16/2015 01:14   Dg Chest Port 1 View  06/16/2015   CLINICAL DATA:  Mid chest discomfort.  Left humerus fracture.  EXAM: PORTABLE CHEST - 1 VIEW  COMPARISON:  02/26/2015  FINDINGS: Again noted is the fracture involving the left humerus. There is a well-defined density in the left lower chest which is probably related to overlying soft tissues. Otherwise, the lungs are clear. Again noted are multiple costal calcifications. Evidence for hyperinflation and underlying emphysematous changes.  IMPRESSION: Density overlying the left lower chest may represent overlying soft tissues but indeterminate. Otherwise, no focal lung disease. Consider follow-up of this area with a two view chest examination.  Hyperinflation with emphysematous changes.   Electronically Signed   By: Richarda Overlie M.D.   On: 06/16/2015 12:51   Dg Shoulder Left  05/20/2015   CLINICAL DATA:  Multiple recent falls with left shoulder pain, initial encounter  EXAM: LEFT SHOULDER - 2+ VIEW  COMPARISON:  None.  FINDINGS: There is no evidence of fracture or dislocation. There is no evidence of arthropathy or other focal bone abnormality. Soft tissues are unremarkable.  IMPRESSION: No acute abnormality noted.   Electronically Signed   By: Alcide Clever M.D.   On: 05/20/2015 18:17   Dg Humerus Left  06/16/2015   CLINICAL DATA:  Pain following fall  EXAM: LEFT HUMERUS - 2+ VIEW  COMPARISON:  None.  FINDINGS: Frontal and lateral  views were obtained. There is an obliquely oriented fracture of the mid humerus with lateral displacement of the distal fracture fragment with respect proximal fragment. There is a nondisplaced obliquely oriented fracture extending from the lateral proximal humeral metaphysis to the proximal diaphysis more medially. No dislocation. Joint spaces appear intact.  IMPRESSION: Displaced fracture, obliquely oriented, mid humerus. Nondisplaced obliquely oriented fracture proximal humerus. No dislocation.   Electronically Signed   By: Bretta Bang III M.D.   On: 06/16/2015 00:08   Dg Foot Complete Left  05/20/2015   CLINICAL DATA:  Two falls in past 2 days  EXAM: LEFT FOOT - COMPLETE 3+ VIEW  COMPARISON:  None.  FINDINGS: Frontal, oblique, and lateral views obtained. There is soft tissue swelling over the dorsum of the foot. Bones are diffusely osteoporotic. There is no fracture or dislocation. There is moderate narrowing of all MTP, PIP, and DIP joints. No erosive change.  IMPRESSION: No fracture  or dislocation apparent. Soft tissue swelling over the dorsum of the foot. Narrowing of multiple distal joints. Bones diffusely osteoporotic.   Electronically Signed   By: Bretta Bang III M.D.   On: 05/20/2015 20:02   Dg Hip Unilat With Pelvis 2-3 Views Left  05/20/2015   CLINICAL DATA:  Left hip pain  EXAM: DG HIP (WITH OR WITHOUT PELVIS) 2-3V LEFT  COMPARISON:  None.  FINDINGS: Status post ORIF of an subtrochanteric left hip fracture. Residual fracture deformity.  Status post ORIF of a prior right hip fracture, without deformity.  No evidence of acute fracture or dislocation.  Bilateral hip joint spaces are symmetric.  Visualized bony pelvis appears intact.  Degenerative changes of the lower lumbar spine.  Vascular calcifications.  IMPRESSION: Status post ORIF of the bilateral hips.  No evidence of acute fracture or dislocation.   Electronically Signed   By: Charline Bills M.D.   On: 05/20/2015 18:06      Medical Consultants:    None.  Anti-Infectives:    None.  Subjective:   Jane [name] seems a little less anxious today, although she still is a bit confused, but knows that she has been confused. Appetite okay.  Bowels moving okay.  No headache, nausea or vomiting.  Objective:    Filed Vitals:   06/18/15 0519 06/18/15 1444 06/18/15 2107 06/19/15 0613  BP: 159/84 125/52 148/60 156/52  Pulse: 90 87 97 75  Temp: 98.1 F (36.7 C) 97.6 F (36.4 C) 98.6 F (37 C) 98.1 F (36.7 C)  TempSrc: Oral Oral Oral Oral  Resp: 20 18 20 18   Height:      Weight:      SpO2: 99% 97% 93% 92%    Intake/Output Summary (Last 24 hours) at 06/19/15 0736 Last data filed at 06/19/15 0602  Gross per 24 hour  Intake 2207.5 ml  Output      0 ml  Net 2207.5 ml    Exam: Gen:  NAD, mildly confused Head: Periorbital bruising on right Cardiovascular:  RRR, No M/R/G Respiratory:  Lungs CTAB Gastrointestinal:  Abdomen soft, NT/ND, + BS Extremities:  Trace edema   Data Reviewed:    Labs: Basic Metabolic Panel:  Recent Labs Lab 06/15/15 2348 06/16/15 1510 06/17/15 0540 06/18/15 0956  NA 139  --  140 139  K 3.7  --  3.7 3.2*  CL 106  --  108 106  CO2 24  --  22 28  GLUCOSE 138*  --  109* 105*  BUN 23*  --  16 17  CREATININE 0.68 0.83 0.65 0.65  CALCIUM 9.0  --  8.7* 8.8*   GFR Estimated Creatinine Clearance: 37.4 mL/min (by C-G formula based on Cr of 0.65). Liver Function Tests:  Recent Labs Lab 06/18/15 0956  AST 23  ALT 12*  ALKPHOS 53  BILITOT 0.6  PROT 6.0*  ALBUMIN 3.6   CBC:  Recent Labs Lab 06/15/15 2348 06/16/15 1510 06/17/15 0540 06/18/15 0956  WBC 12.3* 8.8 6.1 7.1  NEUTROABS 10.6*  --   --   --   HGB 11.2* 10.0* 8.6* 9.1*  HCT 34.4* 30.3* 26.4* 27.5*  MCV 93.0 94.4 94.3 93.9  PLT 155 189 163 166   Thyroid function studies:  Recent Labs  06/16/15 1510  TSH 0.329*   Anemia work up:  Recent Labs  06/18/15 0956  VITAMINB12 94*   FOLATE 26.9  FERRITIN 111  TIBC 199*  IRON 24*  RETICCTPCT 1.4   Urinalysis  Component Value Date/Time   COLORURINE YELLOW 06/18/2015 1852   APPEARANCEUR CLEAR 06/18/2015 1852   LABSPEC 1.019 06/18/2015 1852   PHURINE 5.5 06/18/2015 1852   GLUCOSEU NEGATIVE 06/18/2015 1852   HGBUR TRACE* 06/18/2015 1852   BILIRUBINUR NEGATIVE 06/18/2015 1852   BILIRUBINUR Negative 11/16/2014 1516   KETONESUR NEGATIVE 06/18/2015 1852   PROTEINUR NEGATIVE 06/18/2015 1852   UROBILINOGEN 0.2 06/18/2015 1852   NITRITE NEGATIVE 06/18/2015 1852   NITRITE Negative 11/16/2014 1516   LEUKOCYTESUR NEGATIVE 06/18/2015 1852   LEUKOCYTESUR Negative 11/16/2014 1516     Microbiology No results found for this or any previous visit (from the past 240 hour(s)).   Medications:   . amLODipine  5 mg Oral Daily  . calcium-vitamin D  1 tablet Oral Daily  . enoxaparin (LOVENOX) injection  40 mg Subcutaneous Q24H  . latanoprost  1 drop Both Eyes QHS  . levothyroxine  75 mcg Oral Q48H  . levothyroxine  88 mcg Oral Q48H  . LORazepam  0.5 mg Oral QHS  . mirabegron ER  50 mg Oral Daily  . multivitamin-lutein  1 capsule Oral Daily  . polyethylene glycol  17 g Oral Daily  . potassium chloride  20 mEq Oral BID  . pravastatin  20 mg Oral q1800  . QUEtiapine  50 mg Oral Daily  . sertraline  50 mg Oral Daily  . sodium chloride  3 mL Intravenous Q12H   Continuous Infusions: . sodium chloride 10 mL/hr at 06/19/15 0602    Time spent: 25 minutes.   LOS: 3 days   Jane [name]  [facility] Pager [phone]. If unable to reach me by pager, please call my cell phone at [phone].  *Please refer to [email or web address], password [email or web address] to get updated schedule on who will round on this patient, as hospitalists switch teams weekly. If 7PM-7AM, please contact night-coverage at [email or web address], password [email or web address] for any overnight needs.  06/19/2015, 7:36 AM

## 2015-06-20 DIAGNOSIS — S02401A Maxillary fracture, unspecified, initial encounter for closed fracture: Secondary | ICD-10-CM | POA: Diagnosis not present

## 2015-06-20 DIAGNOSIS — K589 Irritable bowel syndrome without diarrhea: Secondary | ICD-10-CM | POA: Diagnosis not present

## 2015-06-20 DIAGNOSIS — S02401D Maxillary fracture, unspecified, subsequent encounter for fracture with routine healing: Secondary | ICD-10-CM | POA: Diagnosis not present

## 2015-06-20 DIAGNOSIS — G8929 Other chronic pain: Secondary | ICD-10-CM | POA: Diagnosis not present

## 2015-06-20 DIAGNOSIS — R0602 Shortness of breath: Secondary | ICD-10-CM | POA: Diagnosis not present

## 2015-06-20 DIAGNOSIS — R41841 Cognitive communication deficit: Secondary | ICD-10-CM | POA: Diagnosis not present

## 2015-06-20 DIAGNOSIS — D518 Other vitamin B12 deficiency anemias: Secondary | ICD-10-CM | POA: Diagnosis not present

## 2015-06-20 DIAGNOSIS — S72142D Displaced intertrochanteric fracture of left femur, subsequent encounter for closed fracture with routine healing: Secondary | ICD-10-CM | POA: Diagnosis not present

## 2015-06-20 DIAGNOSIS — F329 Major depressive disorder, single episode, unspecified: Secondary | ICD-10-CM | POA: Diagnosis not present

## 2015-06-20 DIAGNOSIS — F419 Anxiety disorder, unspecified: Secondary | ICD-10-CM | POA: Diagnosis not present

## 2015-06-20 DIAGNOSIS — J439 Emphysema, unspecified: Secondary | ICD-10-CM | POA: Diagnosis not present

## 2015-06-20 DIAGNOSIS — M6281 Muscle weakness (generalized): Secondary | ICD-10-CM | POA: Diagnosis not present

## 2015-06-20 DIAGNOSIS — E43 Unspecified severe protein-calorie malnutrition: Secondary | ICD-10-CM | POA: Diagnosis not present

## 2015-06-20 DIAGNOSIS — R278 Other lack of coordination: Secondary | ICD-10-CM | POA: Diagnosis not present

## 2015-06-20 DIAGNOSIS — N3281 Overactive bladder: Secondary | ICD-10-CM | POA: Diagnosis not present

## 2015-06-20 DIAGNOSIS — S023XXD Fracture of orbital floor, subsequent encounter for fracture with routine healing: Secondary | ICD-10-CM | POA: Diagnosis not present

## 2015-06-20 DIAGNOSIS — K30 Functional dyspepsia: Secondary | ICD-10-CM | POA: Diagnosis not present

## 2015-06-20 DIAGNOSIS — S42302A Unspecified fracture of shaft of humerus, left arm, initial encounter for closed fracture: Secondary | ICD-10-CM | POA: Diagnosis not present

## 2015-06-20 DIAGNOSIS — K5900 Constipation, unspecified: Secondary | ICD-10-CM | POA: Diagnosis not present

## 2015-06-20 DIAGNOSIS — E784 Other hyperlipidemia: Secondary | ICD-10-CM | POA: Diagnosis not present

## 2015-06-20 DIAGNOSIS — S42202A Unspecified fracture of upper end of left humerus, initial encounter for closed fracture: Secondary | ICD-10-CM | POA: Diagnosis not present

## 2015-06-20 DIAGNOSIS — F05 Delirium due to known physiological condition: Secondary | ICD-10-CM | POA: Diagnosis not present

## 2015-06-20 DIAGNOSIS — R262 Difficulty in walking, not elsewhere classified: Secondary | ICD-10-CM | POA: Diagnosis not present

## 2015-06-20 DIAGNOSIS — E038 Other specified hypothyroidism: Secondary | ICD-10-CM | POA: Diagnosis not present

## 2015-06-20 DIAGNOSIS — F0390 Unspecified dementia without behavioral disturbance: Secondary | ICD-10-CM | POA: Diagnosis not present

## 2015-06-20 DIAGNOSIS — S42202D Unspecified fracture of upper end of left humerus, subsequent encounter for fracture with routine healing: Secondary | ICD-10-CM | POA: Diagnosis not present

## 2015-06-20 DIAGNOSIS — F0391 Unspecified dementia with behavioral disturbance: Secondary | ICD-10-CM | POA: Diagnosis not present

## 2015-06-20 DIAGNOSIS — R1312 Dysphagia, oropharyngeal phase: Secondary | ICD-10-CM | POA: Diagnosis not present

## 2015-06-20 DIAGNOSIS — I10 Essential (primary) hypertension: Secondary | ICD-10-CM | POA: Diagnosis not present

## 2015-06-20 DIAGNOSIS — S42342A Displaced spiral fracture of shaft of humerus, left arm, initial encounter for closed fracture: Secondary | ICD-10-CM | POA: Diagnosis not present

## 2015-06-20 DIAGNOSIS — J310 Chronic rhinitis: Secondary | ICD-10-CM | POA: Diagnosis not present

## 2015-06-20 DIAGNOSIS — S023XXA Fracture of orbital floor, initial encounter for closed fracture: Secondary | ICD-10-CM | POA: Diagnosis not present

## 2015-06-20 LAB — URINE CULTURE
Culture: NO GROWTH
Special Requests: NORMAL

## 2015-06-20 MED ORDER — HYDROCODONE-ACETAMINOPHEN 5-325 MG PO TABS: 1.0000 | ORAL_TABLET | ORAL | Status: DC | PRN

## 2015-06-20 MED ORDER — CYANOCOBALAMIN 1000 MCG/ML IJ SOLN: 1000.0000 ug | INTRAMUSCULAR | Status: DC

## 2015-06-20 MED ORDER — ACETAMINOPHEN 325 MG PO TABS: 650.0000 mg | ORAL_TABLET | Freq: Four times a day (QID) | ORAL | Status: DC | PRN

## 2015-06-20 MED ORDER — LORAZEPAM 0.5 MG PO TABS: 0.5000 mg | ORAL_TABLET | Freq: Every day | ORAL | Status: DC

## 2015-06-20 NOTE — Clinical Social Work Placement (Signed)
   CLINICAL SOCIAL WORK PLACEMENT  NOTE  Date:  06/20/2015  Patient Details  Name: Jane [name] MRN: [id number] Date of Birth: 12-18-1924  Clinical Social Work is seeking post-discharge placement for this patient at the Skilled  Nursing Facility level of care (*CSW will initial, date and re-position this form in  chart as items are completed):  Yes   Patient/family provided with [facility] Clinical Social Work Department's list of facilities offering this level of care within the geographic area requested by the patient (or if unable, by the patient's family).  Yes   Patient/family informed of their freedom to choose among providers that offer the needed level of care, that participate in Medicare, Medicaid or managed care program needed by the patient, have an available bed and are willing to accept the patient.  Yes   Patient/family informed of [facility]'s ownership interest in [facility] and [facility], as well as of the fact that they are under no obligation to receive care at these facilities.  PASRR submitted to EDS on 06/17/15     PASRR number received on 06/17/15     Existing PASRR number confirmed on       FL2 transmitted to all facilities in geographic area requested by pt/family on 06/17/15     FL2 transmitted to all facilities within larger geographic area on       Patient informed that his/her managed care company has contracts with or will negotiate with certain facilities, including the following:        Yes   Patient/family informed of bed offers received.  Patient chooses bed at [facility]     Physician recommends and patient chooses bed at      Patient to be transferred to [facility] on  June 20, 2015.  Patient to be transferred to facility by     ambulance  Patient family notified on  June 20, 2015 of transfer.  Name of family member notified:      [name]  PHYSICIAN       Additional Comment:     _______________________________________________ Annetta Maw, LCSW 06/20/2015, 1:59 PM

## 2015-06-20 NOTE — Discharge Summary (Addendum)
Physician Discharge Summary  Jane [name] HKV:[id number] DOB: [birth year] DOA: 06/15/2015  PCP: Bufford Spikes, DO  Admit date: 06/15/2015 Discharge date: 06/20/2015   Recommendations for Outpatient Follow-Up:   1. Please arrange for orthopedic follow up in 2 weeks. 2. Note:  Please give HS dose of Ativan when patient turns in for the night.  Do not give when up and about. 3. Please arrange for PT/OT at SNF.   Discharge Diagnosis:   Principal Problem:    Humeral fracture secondary to fall related to polypharmacy Active Problems:    Delirium    Essential hypertension, benign    Hypothyroidism    Protein-calorie malnutrition, severe    Dementia    Fall at home    Fracture of orbital floor, left, closed    Closed fracture of maxillary sinus, left    Vitamin B 12 deficiency   Discharge disposition:  SNF: [facility]  Discharge Condition: Improved.  Diet recommendation: Regular.  Wound care: None.   History of Present Illness:   Jane [name] is an 81 y.o. female with a past medical history of hypertension, dementia, arthritis, currently residing at an independent living facility who was admitted to medicine service on 06/16/2015 after suffering from a fall. Imaging studies revealed the presence of left humerus fracture. She was also found to have fracture through the left orbital floor and lateral wall of the left maxillary sinus. She was admitted for pain management and physical therapy consultation. Labs were significant for a downward trend in her hemoglobin from 11.2 on admission to 8.6 on 06/17/2015.   Hospital Course by Problem:   Principal Problem:  Fall resulting in Humeral fracture as well as left orbital floor and lateral wall of the left maxillary sinus fractures - Fall likely the result of polypharmacy. Medications have been adjusted. - Arm sling placed for left humeral fracture, will follow-up as an outpatient. - Discussed with ENT (Dr. Annalee Genta)  regarding facial fractures. Films reviewed. No surgical intervention needed. - PT/OT evaluations performed. Will be going to SNF.  Active Problems:  Hypokalemia - Resolved on supplementation.   Normocytic anemia / B12 deficiency - Fecal occult blood negative. - Anemia panel sent: B12 deficient. Iron low, TIBC low, ferritin WNL. - Given 1000 g of B-12 by injection 06/18/15 and recommend monthly injections as an outpatient, due 07/19/15.   Essential hypertension, benign - Blood pressures are stable, continue Norvasc 5 mg by mouth daily.   Hypothyroidism - Her TSH is mildly suppressed at 0.329. - For now I will continue her current regimen with Synthroid 75 g alternating with 88 g.   Protein-calorie malnutrition, severe - Continue nutritional supplements.   Dementia with behavioral disturbance / delirium - Continue Seroquel at 50 mg by mouth daily. Ativan resumed daily (1/2 her usual dose) at bedtime to assist with insomnia. - Confusion resolved. - Urinalysis negative for nitrites and leukocytes.   Medical Consultants:    Telephone consult with Dr. Annalee Genta, ENT  Discharge Exam:   Filed Vitals:   06/20/15 0627  BP: 145/61  Pulse: 84  Temp: 98.4 F (36.9 C)  Resp: 24   Filed Vitals:   06/19/15 1436 06/19/15 1439 06/19/15 2045 06/20/15 0627  BP: 118/50 122/47 130/60 145/61  Pulse: 95  88 84  Temp: 98.5 F (36.9 C)  97.7 F (36.5 C) 98.4 F (36.9 C)  TempSrc: Oral  Oral Oral  Resp: 16  24 24   Height:      Weight:      SpO2:  92%  93% 91%    Gen:  NAD Cardiovascular:  RRR, No M/R/G Respiratory: Lungs CTAB Gastrointestinal: Abdomen soft, NT/ND with normal active bowel sounds. Extremities: Trace edema   The results of significant diagnostics from this hospitalization (including imaging, microbiology, ancillary and laboratory) are listed below for reference.     Procedures and Diagnostic Studies:   Dg Ankle Complete Right  06/16/2015   CLINICAL  DATA:  Right ankle pain, fell at home last night  EXAM: RIGHT ANKLE - COMPLETE 3+ VIEW  COMPARISON:  None.  FINDINGS: Four views of the right ankle submitted. No acute fracture or subluxation. There is diffuse osteopenia. Posterior spur of calcaneus. Ankle mortise is preserved.  IMPRESSION: No acute fracture or subluxation. Diffuse osteopenia. Posterior spurring of calcaneus.   Electronically Signed   By: Natasha Mead M.D.   On: 06/16/2015 09:52   Ct Head Wo Contrast  06/16/2015   CLINICAL DATA:  Found on floor, with hematoma about the left orbit. Concern for head or cervical spine injury. Initial encounter.  EXAM: CT HEAD WITHOUT CONTRAST  CT CERVICAL SPINE WITHOUT CONTRAST  TECHNIQUE: Multidetector CT imaging of the head and cervical spine was performed following the standard protocol without intravenous contrast. Multiplanar CT image reconstructions of the cervical spine were also generated.  COMPARISON:  CT of the head performed 02/26/2015, and MRI of the brain performed 02/27/2015  FINDINGS: CT HEAD FINDINGS  There is no evidence of acute infarction, mass lesion, or intra- or extra-axial hemorrhage on CT.  Prominence of the ventricles and sulci reflects mild to moderate cortical volume loss. Diffuse periventricular and subcortical white matter change likely reflects small vessel ischemic microangiopathy.  The brainstem and fourth ventricle are within normal limits. The basal ganglia are unremarkable in appearance. The cerebral hemispheres demonstrate grossly normal gray-white differentiation. No mass effect or midline shift is seen.  There is a fracture through the left orbital floor and lateral wall of the left maxillary sinus, with trace air along the inferior aspect of the left orbit. There is no evidence of herniation of intraorbital contents. Blood is noted partially filling the left maxillary sinus. The visualized portions of the right orbit are within normal limits. The remaining paranasal sinuses and  mastoid air cells are well-aerated. Diffuse soft tissue swelling is noted inferior to the left orbit, with scattered soft tissue air.  CT CERVICAL SPINE FINDINGS  There is no evidence of fracture or subluxation. Vertebral bodies demonstrate normal height and alignment. Multilevel disc space narrowing is noted along the cervical and upper thoracic spine. There is mild grade 1 retrolisthesis of C4 on C5, and mild grade 1 anterolisthesis of C7 on T1. Prevertebral soft tissues are prominent due to the patient's retropharyngeal common carotid arteries.  The thyroid gland is unremarkable in appearance. Interstitial prominence is noted at the lung apices. Scattered calcification is noted at the carotid bifurcations bilaterally.  IMPRESSION: 1. No evidence of traumatic intracranial injury. 2. Fracture through the left orbital floor and lateral wall of the left maxillary sinus, with trace air along the inferior aspect of the left orbit. No evidence of herniation of intraorbital contents. Blood noted partially filling the left maxillary sinus. 3. Diffuse soft tissue swelling inferior to the left orbit, with scattered soft tissue air. 4. No evidence of fracture or subluxation along the cervical spine. 5. Mild to moderate cortical volume loss and diffuse small vessel ischemic microangiopathy. 6. Mild degenerative change along the cervical and upper thoracic spine. 7. Interstitial prominence at  the lung apices. 8. Scattered calcification at the carotid bifurcations bilaterally. Carotid ultrasound would be helpful for further evaluation, when and as deemed clinically appropriate. These results were called by telephone at the time of interpretation on 06/16/2015 at 12:27 am to Dr. Paula Libra, who verbally acknowledged these results.   Electronically Signed   By: Roanna Raider M.D.   On: 06/16/2015 00:27   Ct Cervical Spine Wo Contrast  06/16/2015   CLINICAL DATA:  Found on floor, with hematoma about the left orbit. Concern for  head or cervical spine injury. Initial encounter.  EXAM: CT HEAD WITHOUT CONTRAST  CT CERVICAL SPINE WITHOUT CONTRAST  TECHNIQUE: Multidetector CT imaging of the head and cervical spine was performed following the standard protocol without intravenous contrast. Multiplanar CT image reconstructions of the cervical spine were also generated.  COMPARISON:  CT of the head performed 02/26/2015, and MRI of the brain performed 02/27/2015  FINDINGS: CT HEAD FINDINGS  There is no evidence of acute infarction, mass lesion, or intra- or extra-axial hemorrhage on CT.  Prominence of the ventricles and sulci reflects mild to moderate cortical volume loss. Diffuse periventricular and subcortical white matter change likely reflects small vessel ischemic microangiopathy.  The brainstem and fourth ventricle are within normal limits. The basal ganglia are unremarkable in appearance. The cerebral hemispheres demonstrate grossly normal gray-white differentiation. No mass effect or midline shift is seen.  There is a fracture through the left orbital floor and lateral wall of the left maxillary sinus, with trace air along the inferior aspect of the left orbit. There is no evidence of herniation of intraorbital contents. Blood is noted partially filling the left maxillary sinus. The visualized portions of the right orbit are within normal limits. The remaining paranasal sinuses and mastoid air cells are well-aerated. Diffuse soft tissue swelling is noted inferior to the left orbit, with scattered soft tissue air.  CT CERVICAL SPINE FINDINGS  There is no evidence of fracture or subluxation. Vertebral bodies demonstrate normal height and alignment. Multilevel disc space narrowing is noted along the cervical and upper thoracic spine. There is mild grade 1 retrolisthesis of C4 on C5, and mild grade 1 anterolisthesis of C7 on T1. Prevertebral soft tissues are prominent due to the patient's retropharyngeal common carotid arteries.  The thyroid  gland is unremarkable in appearance. Interstitial prominence is noted at the lung apices. Scattered calcification is noted at the carotid bifurcations bilaterally.  IMPRESSION: 1. No evidence of traumatic intracranial injury. 2. Fracture through the left orbital floor and lateral wall of the left maxillary sinus, with trace air along the inferior aspect of the left orbit. No evidence of herniation of intraorbital contents. Blood noted partially filling the left maxillary sinus. 3. Diffuse soft tissue swelling inferior to the left orbit, with scattered soft tissue air. 4. No evidence of fracture or subluxation along the cervical spine. 5. Mild to moderate cortical volume loss and diffuse small vessel ischemic microangiopathy. 6. Mild degenerative change along the cervical and upper thoracic spine. 7. Interstitial prominence at the lung apices. 8. Scattered calcification at the carotid bifurcations bilaterally. Carotid ultrasound would be helpful for further evaluation, when and as deemed clinically appropriate. These results were called by telephone at the time of interpretation on 06/16/2015 at 12:27 am to Dr. Paula Libra, who verbally acknowledged these results.   Electronically Signed   By: Roanna Raider M.D.   On: 06/16/2015 00:27   Dg Pelvis Portable  06/16/2015   CLINICAL DATA:  Pain following  fall  EXAM: PORTABLE PELVIS 1-2 VIEWS  COMPARISON:  May 20, 2015  FINDINGS: There is evidence of postoperative change in both proximal femurs with stable appearance in these areas compared to prior study. No acute fracture or dislocation. There is moderate narrowing of both hip joints. There is lumbar levoscoliosis. There is atherosclerotic calcification in the aorta and iliac arteries.  IMPRESSION: No acute fracture or dislocation. Postoperative change in both proximal femurs. Moderate narrowing of both hip joints.   Electronically Signed   By: Bretta Bang III M.D.   On: 06/16/2015 01:14   Dg Chest Port 1  View  06/16/2015   CLINICAL DATA:  Mid chest discomfort.  Left humerus fracture.  EXAM: PORTABLE CHEST - 1 VIEW  COMPARISON:  02/26/2015  FINDINGS: Again noted is the fracture involving the left humerus. There is a well-defined density in the left lower chest which is probably related to overlying soft tissues. Otherwise, the lungs are clear. Again noted are multiple costal calcifications. Evidence for hyperinflation and underlying emphysematous changes.  IMPRESSION: Density overlying the left lower chest may represent overlying soft tissues but indeterminate. Otherwise, no focal lung disease. Consider follow-up of this area with a two view chest examination.  Hyperinflation with emphysematous changes.   Electronically Signed   By: Richarda Overlie M.D.   On: 06/16/2015 12:51   Dg Humerus Left  06/16/2015   CLINICAL DATA:  Pain following fall  EXAM: LEFT HUMERUS - 2+ VIEW  COMPARISON:  None.  FINDINGS: Frontal and lateral views were obtained. There is an obliquely oriented fracture of the mid humerus with lateral displacement of the distal fracture fragment with respect proximal fragment. There is a nondisplaced obliquely oriented fracture extending from the lateral proximal humeral metaphysis to the proximal diaphysis more medially. No dislocation. Joint spaces appear intact.  IMPRESSION: Displaced fracture, obliquely oriented, mid humerus. Nondisplaced obliquely oriented fracture proximal humerus. No dislocation.   Electronically Signed   By: Bretta Bang III M.D.   On: 06/16/2015 00:08     Labs:   Basic Metabolic Panel:  Recent Labs Lab 06/15/15 2348 06/16/15 1510 06/17/15 0540 06/18/15 0956 06/19/15 0751  NA 139  --  140 139 141  K 3.7  --  3.7 3.2* 3.9  CL 106  --  108 106 110  CO2 24  --  22 28 24   GLUCOSE 138*  --  109* 105* 92  BUN 23*  --  16 17 15   CREATININE 0.68 0.83 0.65 0.65 0.62  CALCIUM 9.0  --  8.7* 8.8* 9.0   GFR Estimated Creatinine Clearance: 37.4 mL/min (by C-G  formula based on Cr of 0.62). Liver Function Tests:  Recent Labs Lab 06/18/15 0956  AST 23  ALT 12*  ALKPHOS 53  BILITOT 0.6  PROT 6.0*  ALBUMIN 3.6   CBC:  Recent Labs Lab 06/15/15 2348 06/16/15 1510 06/17/15 0540 06/18/15 0956 06/19/15 0751  WBC 12.3* 8.8 6.1 7.1 6.0  NEUTROABS 10.6*  --   --   --   --   HGB 11.2* 10.0* 8.6* 9.1* 8.7*  HCT 34.4* 30.3* 26.4* 27.5* 25.8*  MCV 93.0 94.4 94.3 93.9 93.5  PLT 155 189 163 166 167   Anemia work up  Recent Labs  06/18/15 0956  VITAMINB12 94*  FOLATE 26.9  FERRITIN 111  TIBC 199*  IRON 24*  RETICCTPCT 1.4   Microbiology Recent Results (from the past 240 hour(s))  Culture, Urine     Status: None (Preliminary result)  Collection Time: 06/18/15  6:52 PM  Result Value Ref Range Status   Specimen Description URINE, CATHETERIZED  Final   Special Requests Normal  Final   Culture   Final    NO GROWTH < 24 HOURS Performed at [facility]    Report Status PENDING  Incomplete     Discharge Instructions:   Discharge Instructions    Call MD for:  extreme fatigue    Complete by:  As directed      Call MD for:  persistant dizziness or light-headedness    Complete by:  As directed      Call MD for:  severe uncontrolled pain    Complete by:  As directed      Call MD for:    Complete by:  As directed   Confusion.     Diet general    Complete by:  As directed      Increase activity slowly    Complete by:  As directed      Walk with assistance    Complete by:  As directed      Walker     Complete by:  As directed             Medication List    STOP taking these medications        traMADol 50 MG tablet  Commonly known as:  ULTRAM      TAKE these medications        acetaminophen 325 MG tablet  Commonly known as:  TYLENOL  Take 2 tablets (650 mg total) by mouth every 6 (six) hours as needed for mild pain (or Fever >/= 101).     albuterol 108 (90 BASE) MCG/ACT inhaler  Commonly known as:   PROVENTIL HFA;VENTOLIN HFA  Inhale 2 puffs into the lungs every 6 (six) hours as needed. Shortness of breath.     amLODipine 5 MG tablet  Commonly known as:  NORVASC  TAKE ONE TABLET BY MOUTH EVERY DAY     calcium carbonate 500 MG chewable tablet  Commonly known as:  TUMS - dosed in mg elemental calcium  Chew 1 tablet by mouth daily as needed for indigestion or heartburn.     calcium-vitamin D 500-200 MG-UNIT per tablet  Commonly known as:  OSCAL WITH D  Take 1 tablet by mouth daily.     dicyclomine 10 MG capsule  Commonly known as:  BENTYL  Take 10 mg by mouth at bedtime.     fexofenadine 180 MG tablet  Commonly known as:  ALLEGRA  Take 180 mg by mouth daily as needed for allergies or rhinitis.     HYDROcodone-acetaminophen 5-325 MG per tablet  Commonly known as:  NORCO/VICODIN  Take 1 tablet by mouth every 4 (four) hours as needed for moderate pain.     ICAPS LUTEIN & OMEGA-3 Caps  Take 1 capsule by mouth daily.     latanoprost 0.005 % ophthalmic solution  Commonly known as:  XALATAN  Place 1 drop into both eyes at bedtime.     levothyroxine 75 MCG tablet  Commonly known as:  SYNTHROID, LEVOTHROID  Take 1 tablet (75 mcg total) by mouth daily before breakfast. Take 1 tablet by mouth every other day     levothyroxine 88 MCG tablet  Commonly known as:  SYNTHROID, LEVOTHROID  TAKE ONE TABLET BY MOUTH 30 MINS BEFORE BREAKFAST FOR THYRIOD     LORazepam 0.5 MG tablet  Commonly known as:  ATIVAN  Take 1 tablet (0.5 mg total) by mouth at bedtime.     lovastatin 20 MG tablet  Commonly known as:  MEVACOR  TAKE ONE TABLET BY MOUTH EVERY EVENING     MYRBETRIQ 50 MG Tb24 tablet  Generic drug:  mirabegron ER  TAKE ONE TABLET BY MOUTH EVERY DAY FOR BLADDER     polyethylene glycol packet  Commonly known as:  MIRALAX / GLYCOLAX  Take 17 g by mouth daily.     QUEtiapine 50 MG tablet  Commonly known as:  SEROQUEL  TAKE ONE TABLET BY MOUTH EVERY DAY     sertraline 100 MG  tablet  Commonly known as:  ZOLOFT  Take one tablet by mouth once daily     vitamin B-12 100 MCG tablet  Commonly known as:  CYANOCOBALAMIN  Take 1 tablet (100 mcg total) by mouth daily.           Follow-up Information    Follow up with REED, TIFFANY, DO. Schedule an appointment as soon as possible for a visit in 1 week.   Specialty:  Geriatric Medicine   Why:  Hospital follow up & referral to orthopedics for follow up.   Contact information:   [address] [place] [address] [phone]        Time coordinating discharge: 35 minutes.  Signed:  RAMA,CHRISTINA  Pager [phone] [facility] 06/20/2015, 10:16 AM

## 2015-06-21 DIAGNOSIS — S42302A Unspecified fracture of shaft of humerus, left arm, initial encounter for closed fracture: Secondary | ICD-10-CM | POA: Diagnosis not present

## 2015-07-05 ENCOUNTER — Telehealth: Payer: Self-pay | Admitting: Internal Medicine

## 2015-07-05 NOTE — Telephone Encounter (Signed)
Mrs. Leyva's daughter called and said she was returning Jane [name] call about having a hospital follow up. The daughter stated that Lavern is in a rehab unit at [facility]. She fell and broke some bones and had to go from the hospital to rehab. She has an appt with [place] in October.

## 2015-07-19 DIAGNOSIS — S42342A Displaced spiral fracture of shaft of humerus, left arm, initial encounter for closed fracture: Secondary | ICD-10-CM | POA: Diagnosis not present

## 2015-07-19 DIAGNOSIS — S72142D Displaced intertrochanteric fracture of left femur, subsequent encounter for closed fracture with routine healing: Secondary | ICD-10-CM | POA: Diagnosis not present

## 2015-08-09 DIAGNOSIS — M6281 Muscle weakness (generalized): Secondary | ICD-10-CM | POA: Diagnosis not present

## 2015-08-09 DIAGNOSIS — S42302D Unspecified fracture of shaft of humerus, left arm, subsequent encounter for fracture with routine healing: Secondary | ICD-10-CM | POA: Diagnosis not present

## 2015-08-09 DIAGNOSIS — F0390 Unspecified dementia without behavioral disturbance: Secondary | ICD-10-CM | POA: Diagnosis not present

## 2015-08-09 DIAGNOSIS — M1990 Unspecified osteoarthritis, unspecified site: Secondary | ICD-10-CM | POA: Diagnosis not present

## 2015-08-09 DIAGNOSIS — S0232XD Fracture of orbital floor, left side, subsequent encounter for fracture with routine healing: Secondary | ICD-10-CM | POA: Diagnosis not present

## 2015-08-09 DIAGNOSIS — S42202D Unspecified fracture of upper end of left humerus, subsequent encounter for fracture with routine healing: Secondary | ICD-10-CM | POA: Diagnosis not present

## 2015-08-11 DIAGNOSIS — S42302D Unspecified fracture of shaft of humerus, left arm, subsequent encounter for fracture with routine healing: Secondary | ICD-10-CM | POA: Diagnosis not present

## 2015-08-11 DIAGNOSIS — S42202D Unspecified fracture of upper end of left humerus, subsequent encounter for fracture with routine healing: Secondary | ICD-10-CM | POA: Diagnosis not present

## 2015-08-11 DIAGNOSIS — S0232XD Fracture of orbital floor, left side, subsequent encounter for fracture with routine healing: Secondary | ICD-10-CM | POA: Diagnosis not present

## 2015-08-11 DIAGNOSIS — F0390 Unspecified dementia without behavioral disturbance: Secondary | ICD-10-CM | POA: Diagnosis not present

## 2015-08-11 DIAGNOSIS — M6281 Muscle weakness (generalized): Secondary | ICD-10-CM | POA: Diagnosis not present

## 2015-08-11 DIAGNOSIS — M1990 Unspecified osteoarthritis, unspecified site: Secondary | ICD-10-CM | POA: Diagnosis not present

## 2015-08-16 DIAGNOSIS — S42342D Displaced spiral fracture of shaft of humerus, left arm, subsequent encounter for fracture with routine healing: Secondary | ICD-10-CM | POA: Diagnosis not present

## 2015-08-16 DIAGNOSIS — M6281 Muscle weakness (generalized): Secondary | ICD-10-CM | POA: Diagnosis not present

## 2015-08-16 DIAGNOSIS — M1990 Unspecified osteoarthritis, unspecified site: Secondary | ICD-10-CM | POA: Diagnosis not present

## 2015-08-16 DIAGNOSIS — S42302D Unspecified fracture of shaft of humerus, left arm, subsequent encounter for fracture with routine healing: Secondary | ICD-10-CM | POA: Diagnosis not present

## 2015-08-16 DIAGNOSIS — F0390 Unspecified dementia without behavioral disturbance: Secondary | ICD-10-CM | POA: Diagnosis not present

## 2015-08-16 DIAGNOSIS — S42202D Unspecified fracture of upper end of left humerus, subsequent encounter for fracture with routine healing: Secondary | ICD-10-CM | POA: Diagnosis not present

## 2015-08-16 DIAGNOSIS — S0232XD Fracture of orbital floor, left side, subsequent encounter for fracture with routine healing: Secondary | ICD-10-CM | POA: Diagnosis not present

## 2015-08-17 DIAGNOSIS — S42302D Unspecified fracture of shaft of humerus, left arm, subsequent encounter for fracture with routine healing: Secondary | ICD-10-CM | POA: Diagnosis not present

## 2015-08-17 DIAGNOSIS — M1990 Unspecified osteoarthritis, unspecified site: Secondary | ICD-10-CM | POA: Diagnosis not present

## 2015-08-17 DIAGNOSIS — S0232XD Fracture of orbital floor, left side, subsequent encounter for fracture with routine healing: Secondary | ICD-10-CM | POA: Diagnosis not present

## 2015-08-17 DIAGNOSIS — M6281 Muscle weakness (generalized): Secondary | ICD-10-CM | POA: Diagnosis not present

## 2015-08-17 DIAGNOSIS — S42202D Unspecified fracture of upper end of left humerus, subsequent encounter for fracture with routine healing: Secondary | ICD-10-CM | POA: Diagnosis not present

## 2015-08-17 DIAGNOSIS — F0390 Unspecified dementia without behavioral disturbance: Secondary | ICD-10-CM | POA: Diagnosis not present

## 2015-08-18 DIAGNOSIS — S42302D Unspecified fracture of shaft of humerus, left arm, subsequent encounter for fracture with routine healing: Secondary | ICD-10-CM | POA: Diagnosis not present

## 2015-08-18 DIAGNOSIS — M1990 Unspecified osteoarthritis, unspecified site: Secondary | ICD-10-CM | POA: Diagnosis not present

## 2015-08-18 DIAGNOSIS — S0232XD Fracture of orbital floor, left side, subsequent encounter for fracture with routine healing: Secondary | ICD-10-CM | POA: Diagnosis not present

## 2015-08-18 DIAGNOSIS — M6281 Muscle weakness (generalized): Secondary | ICD-10-CM | POA: Diagnosis not present

## 2015-08-18 DIAGNOSIS — F0390 Unspecified dementia without behavioral disturbance: Secondary | ICD-10-CM | POA: Diagnosis not present

## 2015-08-18 DIAGNOSIS — S42202D Unspecified fracture of upper end of left humerus, subsequent encounter for fracture with routine healing: Secondary | ICD-10-CM | POA: Diagnosis not present

## 2015-08-19 DIAGNOSIS — M1990 Unspecified osteoarthritis, unspecified site: Secondary | ICD-10-CM | POA: Diagnosis not present

## 2015-08-19 DIAGNOSIS — S42202D Unspecified fracture of upper end of left humerus, subsequent encounter for fracture with routine healing: Secondary | ICD-10-CM | POA: Diagnosis not present

## 2015-08-19 DIAGNOSIS — M6281 Muscle weakness (generalized): Secondary | ICD-10-CM | POA: Diagnosis not present

## 2015-08-19 DIAGNOSIS — S0232XD Fracture of orbital floor, left side, subsequent encounter for fracture with routine healing: Secondary | ICD-10-CM | POA: Diagnosis not present

## 2015-08-19 DIAGNOSIS — F0390 Unspecified dementia without behavioral disturbance: Secondary | ICD-10-CM | POA: Diagnosis not present

## 2015-08-19 DIAGNOSIS — S42302D Unspecified fracture of shaft of humerus, left arm, subsequent encounter for fracture with routine healing: Secondary | ICD-10-CM | POA: Diagnosis not present

## 2015-08-20 ENCOUNTER — Other Ambulatory Visit: Payer: Self-pay | Admitting: *Deleted

## 2015-08-20 MED ORDER — LORAZEPAM 0.5 MG PO TABS: 0.5000 mg | ORAL_TABLET | Freq: Every day | ORAL | Status: DC

## 2015-08-20 NOTE — Telephone Encounter (Signed)
[facility] Fax order. Needs Rx faxed to [facility] [phone]

## 2015-08-23 ENCOUNTER — Encounter: Payer: Self-pay | Admitting: Internal Medicine

## 2015-08-23 ENCOUNTER — Ambulatory Visit (INDEPENDENT_AMBULATORY_CARE_PROVIDER_SITE_OTHER): Payer: Medicare Other | Admitting: Internal Medicine

## 2015-08-23 VITALS — BP 132/76 | HR 84 | Temp 97.8°F | Resp 20 | Ht 61.0 in | Wt 113.6 lb

## 2015-08-23 DIAGNOSIS — S42302S Unspecified fracture of shaft of humerus, left arm, sequela: Secondary | ICD-10-CM | POA: Diagnosis not present

## 2015-08-23 DIAGNOSIS — Z23 Encounter for immunization: Secondary | ICD-10-CM | POA: Diagnosis not present

## 2015-08-23 DIAGNOSIS — F0391 Unspecified dementia with behavioral disturbance: Secondary | ICD-10-CM

## 2015-08-23 DIAGNOSIS — S42302D Unspecified fracture of shaft of humerus, left arm, subsequent encounter for fracture with routine healing: Secondary | ICD-10-CM | POA: Diagnosis not present

## 2015-08-23 DIAGNOSIS — E038 Other specified hypothyroidism: Secondary | ICD-10-CM

## 2015-08-23 DIAGNOSIS — M6281 Muscle weakness (generalized): Secondary | ICD-10-CM | POA: Diagnosis not present

## 2015-08-23 DIAGNOSIS — W19XXXD Unspecified fall, subsequent encounter: Secondary | ICD-10-CM | POA: Diagnosis not present

## 2015-08-23 DIAGNOSIS — Y92009 Unspecified place in unspecified non-institutional (private) residence as the place of occurrence of the external cause: Secondary | ICD-10-CM

## 2015-08-23 DIAGNOSIS — S42202D Unspecified fracture of upper end of left humerus, subsequent encounter for fracture with routine healing: Secondary | ICD-10-CM | POA: Diagnosis not present

## 2015-08-23 DIAGNOSIS — E034 Atrophy of thyroid (acquired): Secondary | ICD-10-CM | POA: Diagnosis not present

## 2015-08-23 DIAGNOSIS — E538 Deficiency of other specified B group vitamins: Secondary | ICD-10-CM | POA: Diagnosis not present

## 2015-08-23 DIAGNOSIS — S0232XD Fracture of orbital floor, left side, subsequent encounter for fracture with routine healing: Secondary | ICD-10-CM | POA: Diagnosis not present

## 2015-08-23 DIAGNOSIS — M1990 Unspecified osteoarthritis, unspecified site: Secondary | ICD-10-CM | POA: Diagnosis not present

## 2015-08-23 DIAGNOSIS — M810 Age-related osteoporosis without current pathological fracture: Secondary | ICD-10-CM | POA: Diagnosis not present

## 2015-08-23 DIAGNOSIS — F0390 Unspecified dementia without behavioral disturbance: Secondary | ICD-10-CM | POA: Diagnosis not present

## 2015-08-23 NOTE — Progress Notes (Signed)
Patient ID: Jane [name], female   DOB: 1925-07-31, 81 y.o.   MRN: [id number]   Location: [facility] Provider: Gwenith Spitz. Renato Gails, D.O., C.M.D.  Code Status: DNR Goals of Care: Advanced Directive information Does patient have an advance directive?: Yes  Chief Complaint  Patient presents with  . Medical Management of Chronic Issues    Hypothroidism, Hypertension  . Immunizations    Would like flu shot today    HPI: Patient is a 81 y.o. female seen in the office today for med mgt of chronic diseases.  She was hospitalized 8/9-8/14 with left humerus fracture and left orbital rim.  Nothing seemed out of sorts with her evening until the fall occurred and pt did not remember course of events.  Says she has blackout spells were the room goes round and round.  Says that happened after she didn't sleep all night after moving.  She is on a reduced dose of the ativan due to dizziness.  She had been very active at the independent living apt and doing fine until the fall.  She then went to rehab at [facility] for 6 wks.  There were 3 care meetings at [facility] suggested she move to [place].  [name] went searching and [facility] came through with a lovely apt and she moved there 08/05/15.  Says it's fine.  Things move slowly.  No falls at [facility].  Slept on left arm last night and it popped.  [name] asks me to check it today.  Pt did have a frozen shoulder at one time years ago.    Saw Dr. Roda Shutters a couple of times.  Plastic brace was taken off on the 10/10 and she's working on bearing weight now there.  Still unable to use her walker without assistance.    Synthroid was increased to .   She was said to be b12 deficient and was given shots.   She does not want shots.  VA form given to me today by [name]. We reviewed the functional assessment together and I completed it today and put it in the mail to her after the visit.  Review of Systems:  ROS  Past Medical History  Diagnosis Date  .  Hypertension   . Impacted cerumen   . Abnormality of gait   . Anal fissure   . Loss of weight   . Hyposmolality and/or hyponatremia   . Pain in limb   . Other vitamin B12 deficiency anemia   . Chest pain, unspecified   . Anemia, unspecified   . Anxiety state, unspecified   . Abdominal pain, right upper quadrant   . Other pulmonary embolism and infarction   . Intestinal or peritoneal adhesions with obstruction (postoperative) (postinfection) (HCC)   . Long term (current) use of anticoagulants   . Abdominal pain, right lower quadrant   . Essential and other specified forms of tremor   . Lumbago   . Unspecified hypothyroidism   . Major depressive disorder, single episode, unspecified (HCC)   . Other and unspecified hyperlipidemia   . Macular degeneration (senile) of retina, unspecified   . Unspecified glaucoma   . Unspecified cataract   . Unspecified hearing loss   . Unspecified essential hypertension   . Osteoarthrosis, unspecified whether generalized or localized, unspecified site   . Osteoporosis, unspecified   . Abnormal involuntary movements(781.0)   . Emphysema of lung (HCC)   . CAP (community acquired pneumonia) 01/31/2014  . Postoperative anemia due to acute blood loss 01/31/2014  .  Laceration of left ear canal 04/13/2014  . Closed left hip fracture (HCC) 06/25/2014  . Gastritis 02/13/2013  . Hyperlipidemia   . Fracture of orbital floor, left, closed 06/18/2015  . Humerus fracture   . Closed fracture of maxillary sinus, left 06/18/2015  . Vitamin B 12 deficiency 06/19/2015    Past Surgical History  Procedure Laterality Date  . Excisional hemorrhoidectomy  1980  . Bladder suspension  1993  . Back surgery  2003  . Abdominal hysterectomy    . Trigger finger release  2009    Dr Teressa Senter  . Intramedullary (im) nail intertrochanteric Right 01/27/2014    Procedure: INTRAMEDULLARY (IM) NAIL INTERTROCHANTRIC;  Surgeon: Sheral Apley, MD;  Location: [facility] OR;  Service: Orthopedics;   Laterality: Right;  . Intramedullary (im) nail intertrochanteric Left 06/26/2014    Procedure: LEFT HIP INTRAMEDULLARY (IM) NAIL;  Surgeon: Cheral Almas, MD;  Location: [facility] OR;  Service: Orthopedics;  Laterality: Left;    Allergies  Allergen Reactions  . Celebrex [Celecoxib]     unknown  . Chlorzoxazone     unknown  . Fentanyl Anxiety      Medication List       This list is accurate as of: 08/23/15  3:39 PM.  Always use your most recent med list.               acetaminophen 325 MG tablet  Commonly known as:  TYLENOL  Take 2 tablets (650 mg total) by mouth every 6 (six) hours as needed for mild pain (or Fever >/= 101).     albuterol 108 (90 BASE) MCG/ACT inhaler  Commonly known as:  PROVENTIL HFA;VENTOLIN HFA  Inhale 2 puffs into the lungs every 6 (six) hours as needed. Shortness of breath.     amLODipine 5 MG tablet  Commonly known as:  NORVASC  TAKE ONE TABLET BY MOUTH EVERY DAY     calcium carbonate 500 MG chewable tablet  Commonly known as:  TUMS - dosed in mg elemental calcium  Chew 1 tablet by mouth daily as needed for indigestion or heartburn.     calcium-vitamin D 500-200 MG-UNIT tablet  Commonly known as:  OSCAL WITH D  Take 1 tablet by mouth daily.     dicyclomine 10 MG capsule  Commonly known as:  BENTYL  Take 10 mg by mouth at bedtime.     fexofenadine 180 MG tablet  Commonly known as:  ALLEGRA  Take 180 mg by mouth daily as needed for allergies or rhinitis.     HYDROcodone-acetaminophen 5-325 MG tablet  Commonly known as:  NORCO/VICODIN  Take 1 tablet by mouth every 4 (four) hours as needed for moderate pain.     ICAPS LUTEIN & OMEGA-3 Caps  Take 1 capsule by mouth daily.     latanoprost 0.005 % ophthalmic solution  Commonly known as:  XALATAN  Place 1 drop into both eyes at bedtime.     levothyroxine 75 MCG tablet  Commonly known as:  SYNTHROID, LEVOTHROID  Take 1 tablet (75 mcg total) by mouth daily before breakfast. Take 1 tablet by  mouth every other day     levothyroxine 88 MCG tablet  Commonly known as:  SYNTHROID, LEVOTHROID  TAKE ONE TABLET BY MOUTH 30 MINS BEFORE BREAKFAST FOR THYRIOD     LORazepam 0.5 MG tablet  Commonly known as:  ATIVAN  Take 1 tablet (0.5 mg total) by mouth at bedtime.     lovastatin 20 MG tablet  Commonly  known as:  MEVACOR  TAKE ONE TABLET BY MOUTH EVERY EVENING     MYRBETRIQ 50 MG Tb24 tablet  Generic drug:  mirabegron ER  TAKE ONE TABLET BY MOUTH EVERY DAY FOR BLADDER     polyethylene glycol packet  Commonly known as:  MIRALAX / GLYCOLAX  Take 17 g by mouth daily.     QUEtiapine 50 MG tablet  Commonly known as:  SEROQUEL  TAKE ONE TABLET BY MOUTH EVERY DAY     sertraline 100 MG tablet  Commonly known as:  ZOLOFT  Take one tablet by mouth once daily     vitamin B-12 100 MCG tablet  Commonly known as:  CYANOCOBALAMIN  Take 1 tablet (100 mcg total) by mouth daily.        Health Maintenance  Topic Date Due  . INFLUENZA VACCINE  06/07/2015  . ZOSTAVAX  11/07/2015 (Originally 08/08/1987)  . TETANUS/TDAP  12/17/2021  . DEXA SCAN  Completed  . PNA vac Low Risk Adult  Completed    Physical Exam: Filed Vitals:   08/23/15 1509  BP: 132/76  Pulse: 84  Temp: 97.8 F (36.6 C)  TempSrc: Oral  Resp: 20  Height: 5\' 1"  (1.549 m)  Weight: 113 lb 9.6 oz (51.529 kg)  SpO2: 93%   Body mass index is 21.48 kg/(m^2). Physical Exam  Labs reviewed: Basic Metabolic Panel:  Recent Labs  32/44/01 0145 04/08/15 0932 05/24/15 1611  06/16/15 1510 06/17/15 0540 06/18/15 0956 06/19/15 0751  NA 138  --  142  < >  --  140 139 141  K 3.9  --  4.9  < >  --  3.7 3.2* 3.9  CL 107  --  102  < >  --  108 106 110  CO2 27  --  27  < >  --  22 28 24   GLUCOSE 92  --  100*  < >  --  109* 105* 92  BUN 19  --  23  < >  --  16 17 15   CREATININE 0.69  --  0.76  < > 0.83 0.65 0.65 0.62  CALCIUM 9.0  --  9.7  < >  --  8.7* 8.8* 9.0  MG 2.0  --   --   --   --   --   --   --   PHOS 3.7  --    --   --   --   --   --   --   TSH 19.354* 0.366* 0.849  --  0.329*  --   --   --   < > = values in this interval not displayed. Liver Function Tests:  Recent Labs  02/26/15 2229 02/27/15 0145 06/18/15 0956  AST 17 17 23   ALT 13 13 12*  ALKPHOS 70 62 53  BILITOT 0.5 0.6 0.6  PROT 6.3 6.1 6.0*  ALBUMIN 4.4 4.1 3.6   No results for input(s): LIPASE, AMYLASE in the last 8760 hours. No results for input(s): AMMONIA in the last 8760 hours. CBC:  Recent Labs  09/15/14 1410 02/22/15 1420  06/15/15 2348  06/17/15 0540 06/18/15 0956 06/19/15 0751  WBC 5.8 6.5  < > 12.3*  < > 6.1 7.1 6.0  NEUTROABS 3.8 4.9  --  10.6*  --   --   --   --   HGB 12.8 12.0  < > 11.2*  < > 8.6* 9.1* 8.7*  HCT 39.7 35.8  < > 34.4*  < >  26.4* 27.5* 25.8*  MCV 93 92  < > 93.0  < > 94.3 93.9 93.5  PLT  --  207  < > 155  < > 163 166 167  < > = values in this interval not displayed. Lipid Panel:  Recent Labs  04/08/15 0932  CHOL 148  HDL 50  LDLCALC 80  TRIG 90  CHOLHDL 3.0   Lab Results  Component Value Date   HGBA1C 5.5 02/27/2015    Procedures since last visit: Dg Ankle Complete Right  06/16/2015  CLINICAL DATA:  Right ankle pain, fell at home last night EXAM: RIGHT ANKLE - COMPLETE 3+ VIEW COMPARISON:  None. FINDINGS: Four views of the right ankle submitted. No acute fracture or subluxation. There is diffuse osteopenia. Posterior spur of calcaneus. Ankle mortise is preserved. IMPRESSION: No acute fracture or subluxation. Diffuse osteopenia. Posterior spurring of calcaneus. Electronically Signed   By: Natasha Mead M.D.   On: 06/16/2015 09:52   Ct Head Wo Contrast  06/16/2015  CLINICAL DATA:  Found on floor, with hematoma about the left orbit. Concern for head or cervical spine injury. Initial encounter. EXAM: CT HEAD WITHOUT CONTRAST CT CERVICAL SPINE WITHOUT CONTRAST TECHNIQUE: Multidetector CT imaging of the head and cervical spine was performed following the standard protocol without  intravenous contrast. Multiplanar CT image reconstructions of the cervical spine were also generated. COMPARISON:  CT of the head performed 02/26/2015, and MRI of the brain performed 02/27/2015 FINDINGS: CT HEAD FINDINGS There is no evidence of acute infarction, mass lesion, or intra- or extra-axial hemorrhage on CT. Prominence of the ventricles and sulci reflects mild to moderate cortical volume loss. Diffuse periventricular and subcortical white matter change likely reflects small vessel ischemic microangiopathy. The brainstem and fourth ventricle are within normal limits. The basal ganglia are unremarkable in appearance. The cerebral hemispheres demonstrate grossly normal gray-white differentiation. No mass effect or midline shift is seen. There is a fracture through the left orbital floor and lateral wall of the left maxillary sinus, with trace air along the inferior aspect of the left orbit. There is no evidence of herniation of intraorbital contents. Blood is noted partially filling the left maxillary sinus. The visualized portions of the right orbit are within normal limits. The remaining paranasal sinuses and mastoid air cells are well-aerated. Diffuse soft tissue swelling is noted inferior to the left orbit, with scattered soft tissue air. CT CERVICAL SPINE FINDINGS There is no evidence of fracture or subluxation. Vertebral bodies demonstrate normal height and alignment. Multilevel disc space narrowing is noted along the cervical and upper thoracic spine. There is mild grade 1 retrolisthesis of C4 on C5, and mild grade 1 anterolisthesis of C7 on T1. Prevertebral soft tissues are prominent due to the patient's retropharyngeal common carotid arteries. The thyroid gland is unremarkable in appearance. Interstitial prominence is noted at the lung apices. Scattered calcification is noted at the carotid bifurcations bilaterally. IMPRESSION: 1. No evidence of traumatic intracranial injury. 2. Fracture through the  left orbital floor and lateral wall of the left maxillary sinus, with trace air along the inferior aspect of the left orbit. No evidence of herniation of intraorbital contents. Blood noted partially filling the left maxillary sinus. 3. Diffuse soft tissue swelling inferior to the left orbit, with scattered soft tissue air. 4. No evidence of fracture or subluxation along the cervical spine. 5. Mild to moderate cortical volume loss and diffuse small vessel ischemic microangiopathy. 6. Mild degenerative change along the cervical and upper thoracic spine.  7. Interstitial prominence at the lung apices. 8. Scattered calcification at the carotid bifurcations bilaterally. Carotid ultrasound would be helpful for further evaluation, when and as deemed clinically appropriate. These results were called by telephone at the time of interpretation on 06/16/2015 at 12:27 am to Dr. Paula Libra, who verbally acknowledged these results. Electronically Signed   By: Roanna Raider M.D.   On: 06/16/2015 00:27   Ct Cervical Spine Wo Contrast  06/16/2015  CLINICAL DATA:  Found on floor, with hematoma about the left orbit. Concern for head or cervical spine injury. Initial encounter. EXAM: CT HEAD WITHOUT CONTRAST CT CERVICAL SPINE WITHOUT CONTRAST TECHNIQUE: Multidetector CT imaging of the head and cervical spine was performed following the standard protocol without intravenous contrast. Multiplanar CT image reconstructions of the cervical spine were also generated. COMPARISON:  CT of the head performed 02/26/2015, and MRI of the brain performed 02/27/2015 FINDINGS: CT HEAD FINDINGS There is no evidence of acute infarction, mass lesion, or intra- or extra-axial hemorrhage on CT. Prominence of the ventricles and sulci reflects mild to moderate cortical volume loss. Diffuse periventricular and subcortical white matter change likely reflects small vessel ischemic microangiopathy. The brainstem and fourth ventricle are within normal limits.  The basal ganglia are unremarkable in appearance. The cerebral hemispheres demonstrate grossly normal gray-white differentiation. No mass effect or midline shift is seen. There is a fracture through the left orbital floor and lateral wall of the left maxillary sinus, with trace air along the inferior aspect of the left orbit. There is no evidence of herniation of intraorbital contents. Blood is noted partially filling the left maxillary sinus. The visualized portions of the right orbit are within normal limits. The remaining paranasal sinuses and mastoid air cells are well-aerated. Diffuse soft tissue swelling is noted inferior to the left orbit, with scattered soft tissue air. CT CERVICAL SPINE FINDINGS There is no evidence of fracture or subluxation. Vertebral bodies demonstrate normal height and alignment. Multilevel disc space narrowing is noted along the cervical and upper thoracic spine. There is mild grade 1 retrolisthesis of C4 on C5, and mild grade 1 anterolisthesis of C7 on T1. Prevertebral soft tissues are prominent due to the patient's retropharyngeal common carotid arteries. The thyroid gland is unremarkable in appearance. Interstitial prominence is noted at the lung apices. Scattered calcification is noted at the carotid bifurcations bilaterally. IMPRESSION: 1. No evidence of traumatic intracranial injury. 2. Fracture through the left orbital floor and lateral wall of the left maxillary sinus, with trace air along the inferior aspect of the left orbit. No evidence of herniation of intraorbital contents. Blood noted partially filling the left maxillary sinus. 3. Diffuse soft tissue swelling inferior to the left orbit, with scattered soft tissue air. 4. No evidence of fracture or subluxation along the cervical spine. 5. Mild to moderate cortical volume loss and diffuse small vessel ischemic microangiopathy. 6. Mild degenerative change along the cervical and upper thoracic spine. 7. Interstitial  prominence at the lung apices. 8. Scattered calcification at the carotid bifurcations bilaterally. Carotid ultrasound would be helpful for further evaluation, when and as deemed clinically appropriate. These results were called by telephone at the time of interpretation on 06/16/2015 at 12:27 am to Dr. Paula Libra, who verbally acknowledged these results. Electronically Signed   By: Roanna Raider M.D.   On: 06/16/2015 00:27   Dg Pelvis Portable  06/16/2015  CLINICAL DATA:  Pain following fall EXAM: PORTABLE PELVIS 1-2 VIEWS COMPARISON:  May 20, 2015 FINDINGS: There is evidence  of postoperative change in both proximal femurs with stable appearance in these areas compared to prior study. No acute fracture or dislocation. There is moderate narrowing of both hip joints. There is lumbar levoscoliosis. There is atherosclerotic calcification in the aorta and iliac arteries. IMPRESSION: No acute fracture or dislocation. Postoperative change in both proximal femurs. Moderate narrowing of both hip joints. Electronically Signed   By: Bretta Bang III M.D.   On: 06/16/2015 01:14   Dg Chest Port 1 View  06/16/2015  CLINICAL DATA:  Mid chest discomfort.  Left humerus fracture. EXAM: PORTABLE CHEST - 1 VIEW COMPARISON:  02/26/2015 FINDINGS: Again noted is the fracture involving the left humerus. There is a well-defined density in the left lower chest which is probably related to overlying soft tissues. Otherwise, the lungs are clear. Again noted are multiple costal calcifications. Evidence for hyperinflation and underlying emphysematous changes. IMPRESSION: Density overlying the left lower chest may represent overlying soft tissues but indeterminate. Otherwise, no focal lung disease. Consider follow-up of this area with a two view chest examination. Hyperinflation with emphysematous changes. Electronically Signed   By: Richarda Overlie M.D.   On: 06/16/2015 12:51   Dg Humerus Left  06/16/2015  CLINICAL DATA:  Pain  following fall EXAM: LEFT HUMERUS - 2+ VIEW COMPARISON:  None. FINDINGS: Frontal and lateral views were obtained. There is an obliquely oriented fracture of the mid humerus with lateral displacement of the distal fracture fragment with respect proximal fragment. There is a nondisplaced obliquely oriented fracture extending from the lateral proximal humeral metaphysis to the proximal diaphysis more medially. No dislocation. Joint spaces appear intact. IMPRESSION: Displaced fracture, obliquely oriented, mid humerus. Nondisplaced obliquely oriented fracture proximal humerus. No dislocation. Electronically Signed   By: Bretta Bang III M.D.   On: 06/16/2015 00:08   Assessment/Plan 1. Humerus fracture, left, sequela -is gradually making progress as in hpi with this, is able to lift the arm a lot more than she could but this has prevented walker use and led to deconditioning in her lower extremties also  2. Hypothyroidism due to acquired atrophy of thyroid -cont current synthroid but f/u tsh due to changes that were made at rehab - TSH  3. Dementia, with behavioral disturbance - seems more vascular and depression related - has been pretty stable cognitively except when delirious for several years -is requiring more ADL help but primarily due to her falls and frailty not so much her cognition - Basic metabolic panel - CBC with Differential  4. Senile osteoporosis -- Vitamin D, 25-hydroxy -cont oscal with D  5. Fall at home, subsequent encounter - discussed importance of treating her osteoporosis appropriately so f/u D levels  - Vitamin D, 25-hydroxy -cont therapy at new level of care  6. Vitamin B 12 deficiency -having worsening of neuropathic pain so f/u labs: - B12 and Folate Panel  7. Need for prophylactic vaccination and inoculation against influenza - Flu Vaccine QUAD 36+ mos PF IM (Fluarix & Fluzone Quad PF)   Labs/tests ordered:   Orders Placed This Encounter  Procedures  .  Flu Vaccine QUAD 36+ mos PF IM (Fluarix & Fluzone Quad PF)  . B12 and Folate Panel  . TSH  . Basic metabolic panel  . CBC with Differential  . Vitamin D, 25-hydroxy    Next appt: 3 mos med mgt  Brianca Fortenberry L. Royelle Hinchman, D.O. Geriatrics [facility] [address] [place], [place] [address] Cell Phone (Mon-Fri 8am-5pm):  [phone]  On Call:  [phone] & follow prompts after 5pm & weekends Office Phone:  [phone] Office Fax:  [phone]

## 2015-08-24 LAB — CBC WITH DIFFERENTIAL/PLATELET
Basophils Absolute: 0 10*3/uL (ref 0.0–0.2)
Basos: 0 %
EOS (ABSOLUTE): 0.1 10*3/uL (ref 0.0–0.4)
Eos: 2 %
Hematocrit: 37.9 % (ref 34.0–46.6)
Hemoglobin: 12.3 g/dL (ref 11.1–15.9)
Immature Grans (Abs): 0 10*3/uL (ref 0.0–0.1)
Immature Granulocytes: 0 %
Lymphocytes Absolute: 1.5 10*3/uL (ref 0.7–3.1)
Lymphs: 30 %
MCH: 29.7 pg (ref 26.6–33.0)
MCHC: 32.5 g/dL (ref 31.5–35.7)
MCV: 92 fL (ref 79–97)
Monocytes Absolute: 0.4 10*3/uL (ref 0.1–0.9)
Monocytes: 8 %
Neutrophils Absolute: 2.9 10*3/uL (ref 1.4–7.0)
Neutrophils: 60 %
Platelets: 203 10*3/uL (ref 150–379)
RBC: 4.14 x10E6/uL (ref 3.77–5.28)
RDW: 13.8 % (ref 12.3–15.4)
WBC: 4.9 10*3/uL (ref 3.4–10.8)

## 2015-08-24 LAB — B12 AND FOLATE PANEL
Folate: >20 ng/mL (ref >3.0)
Vitamin B-12: 226 pg/mL (ref 211–946)

## 2015-08-24 LAB — TSH: TSH: 0.601 u[IU]/mL (ref 0.450–4.500)

## 2015-08-24 LAB — VITAMIN D 25 HYDROXY (VIT D DEFICIENCY, FRACTURES): Vit D, 25-Hydroxy: 29.9 ng/mL — ABNORMAL LOW (ref 30.0–100.0)

## 2015-08-24 LAB — BASIC METABOLIC PANEL
BUN/Creatinine Ratio: 32 — ABNORMAL HIGH (ref 11–26)
BUN: 24 mg/dL (ref 8–27)
CO2: 25 mmol/L (ref 18–29)
Calcium: 9.9 mg/dL (ref 8.7–10.3)
Chloride: 101 mmol/L (ref 97–106)
Creatinine, Ser: 0.76 mg/dL (ref 0.57–1.00)
GFR calc Af Amer: 81 mL/min/{1.73_m2} (ref >59)
GFR calc non Af Amer: 70 mL/min/{1.73_m2} (ref >59)
Glucose: 86 mg/dL (ref 65–99)
Potassium: 4 mmol/L (ref 3.5–5.2)
Sodium: 143 mmol/L (ref 136–144)

## 2015-08-25 DIAGNOSIS — S0232XD Fracture of orbital floor, left side, subsequent encounter for fracture with routine healing: Secondary | ICD-10-CM | POA: Diagnosis not present

## 2015-08-25 DIAGNOSIS — F0390 Unspecified dementia without behavioral disturbance: Secondary | ICD-10-CM | POA: Diagnosis not present

## 2015-08-25 DIAGNOSIS — M1990 Unspecified osteoarthritis, unspecified site: Secondary | ICD-10-CM | POA: Diagnosis not present

## 2015-08-25 DIAGNOSIS — S42302D Unspecified fracture of shaft of humerus, left arm, subsequent encounter for fracture with routine healing: Secondary | ICD-10-CM | POA: Diagnosis not present

## 2015-08-25 DIAGNOSIS — S42202D Unspecified fracture of upper end of left humerus, subsequent encounter for fracture with routine healing: Secondary | ICD-10-CM | POA: Diagnosis not present

## 2015-08-25 DIAGNOSIS — M6281 Muscle weakness (generalized): Secondary | ICD-10-CM | POA: Diagnosis not present

## 2015-08-26 ENCOUNTER — Telehealth: Payer: Self-pay

## 2015-08-26 MED ORDER — VITAMIN D 50 MCG (2000 UT) PO TABS: ORAL_TABLET | ORAL | Status: DC

## 2015-08-26 NOTE — Telephone Encounter (Signed)
Spoke with daughter [name], about lab results, will bring mother in for Vitamin B-12 injections Tuesday 08/21/15 1st one of 3 injections then repeat B 12 level. D/c B12 oral medication per Dr. Renato Gails. Start Vitamin D 2000 units p.o. Daily. Orders to [facility] faxed [phone]. Will get insurance approval for prolia. Call daughter [name] before Prolia injection is given. All patient's money is going to [facility].

## 2015-08-31 ENCOUNTER — Telehealth: Payer: Self-pay | Admitting: *Deleted

## 2015-08-31 ENCOUNTER — Ambulatory Visit (INDEPENDENT_AMBULATORY_CARE_PROVIDER_SITE_OTHER): Payer: Medicare Other

## 2015-08-31 DIAGNOSIS — M1990 Unspecified osteoarthritis, unspecified site: Secondary | ICD-10-CM | POA: Diagnosis not present

## 2015-08-31 DIAGNOSIS — M6281 Muscle weakness (generalized): Secondary | ICD-10-CM | POA: Diagnosis not present

## 2015-08-31 DIAGNOSIS — D519 Vitamin B12 deficiency anemia, unspecified: Secondary | ICD-10-CM | POA: Diagnosis not present

## 2015-08-31 DIAGNOSIS — S42302D Unspecified fracture of shaft of humerus, left arm, subsequent encounter for fracture with routine healing: Secondary | ICD-10-CM | POA: Diagnosis not present

## 2015-08-31 DIAGNOSIS — S42202D Unspecified fracture of upper end of left humerus, subsequent encounter for fracture with routine healing: Secondary | ICD-10-CM | POA: Diagnosis not present

## 2015-08-31 DIAGNOSIS — F0390 Unspecified dementia without behavioral disturbance: Secondary | ICD-10-CM | POA: Diagnosis not present

## 2015-08-31 DIAGNOSIS — S0232XD Fracture of orbital floor, left side, subsequent encounter for fracture with routine healing: Secondary | ICD-10-CM | POA: Diagnosis not present

## 2015-08-31 MED ORDER — CYANOCOBALAMIN 1000 MCG/ML IJ SOLN
1000.0000 ug | Freq: Once | INTRAMUSCULAR | Status: AC
  Administered 2015-08-31: 1000 ug via INTRAMUSCULAR

## 2015-08-31 NOTE — Telephone Encounter (Signed)
Daughter, [name] called and stated that patient has moved to [facility]. Daughter wants her medication changed due to the cost. Patient's as needed inhaler Proventil is $85.00 and would like a different inhaler, Needs the generic (Ventolin is covered)  Prescribed. The Dr. In rehab prescribed brand name.  And patient's Myrbetriq is $65.00 and would like an alternative. Needs an order faxed to [facility] with changes.  Her medications got messed up when she was in rehab. Please Advise.

## 2015-08-31 NOTE — Telephone Encounter (Signed)
The change in inhaler is fine.   The myrbetriq is different from alternatives.  It is much safer for her in terms of fall risk and confusion (than vesicare, ditropan, detrol, etc).  I recommend she continue it or take nothing for her overactive bladder.  Please call [name] and let her know.

## 2015-09-01 MED ORDER — ALBUTEROL SULFATE HFA 108 (90 BASE) MCG/ACT IN AERS: 2.0000 | INHALATION_SPRAY | Freq: Four times a day (QID) | RESPIRATORY_TRACT | Status: DC | PRN

## 2015-09-01 NOTE — Telephone Encounter (Signed)
Rx sent to [facility] indicating dispense generic. I left message on voicemail for [name] with Dr.Reed's response/recommendations. Instructed [name] to call back if she has questions or concerns

## 2015-09-02 DIAGNOSIS — M6281 Muscle weakness (generalized): Secondary | ICD-10-CM | POA: Diagnosis not present

## 2015-09-02 DIAGNOSIS — S42302D Unspecified fracture of shaft of humerus, left arm, subsequent encounter for fracture with routine healing: Secondary | ICD-10-CM | POA: Diagnosis not present

## 2015-09-02 DIAGNOSIS — F0390 Unspecified dementia without behavioral disturbance: Secondary | ICD-10-CM | POA: Diagnosis not present

## 2015-09-02 DIAGNOSIS — S0232XD Fracture of orbital floor, left side, subsequent encounter for fracture with routine healing: Secondary | ICD-10-CM | POA: Diagnosis not present

## 2015-09-02 DIAGNOSIS — M1990 Unspecified osteoarthritis, unspecified site: Secondary | ICD-10-CM | POA: Diagnosis not present

## 2015-09-02 DIAGNOSIS — S42202D Unspecified fracture of upper end of left humerus, subsequent encounter for fracture with routine healing: Secondary | ICD-10-CM | POA: Diagnosis not present

## 2015-09-03 DIAGNOSIS — F0390 Unspecified dementia without behavioral disturbance: Secondary | ICD-10-CM | POA: Diagnosis not present

## 2015-09-03 DIAGNOSIS — S0232XD Fracture of orbital floor, left side, subsequent encounter for fracture with routine healing: Secondary | ICD-10-CM | POA: Diagnosis not present

## 2015-09-03 DIAGNOSIS — S42302D Unspecified fracture of shaft of humerus, left arm, subsequent encounter for fracture with routine healing: Secondary | ICD-10-CM | POA: Diagnosis not present

## 2015-09-03 DIAGNOSIS — M6281 Muscle weakness (generalized): Secondary | ICD-10-CM | POA: Diagnosis not present

## 2015-09-03 DIAGNOSIS — S42202D Unspecified fracture of upper end of left humerus, subsequent encounter for fracture with routine healing: Secondary | ICD-10-CM | POA: Diagnosis not present

## 2015-09-03 DIAGNOSIS — M1990 Unspecified osteoarthritis, unspecified site: Secondary | ICD-10-CM | POA: Diagnosis not present

## 2015-09-07 DIAGNOSIS — F0390 Unspecified dementia without behavioral disturbance: Secondary | ICD-10-CM | POA: Diagnosis not present

## 2015-09-07 DIAGNOSIS — S0232XD Fracture of orbital floor, left side, subsequent encounter for fracture with routine healing: Secondary | ICD-10-CM | POA: Diagnosis not present

## 2015-09-07 DIAGNOSIS — S42202D Unspecified fracture of upper end of left humerus, subsequent encounter for fracture with routine healing: Secondary | ICD-10-CM | POA: Diagnosis not present

## 2015-09-07 DIAGNOSIS — M1990 Unspecified osteoarthritis, unspecified site: Secondary | ICD-10-CM | POA: Diagnosis not present

## 2015-09-07 DIAGNOSIS — S42302D Unspecified fracture of shaft of humerus, left arm, subsequent encounter for fracture with routine healing: Secondary | ICD-10-CM | POA: Diagnosis not present

## 2015-09-07 DIAGNOSIS — M6281 Muscle weakness (generalized): Secondary | ICD-10-CM | POA: Diagnosis not present

## 2015-09-08 DIAGNOSIS — M1990 Unspecified osteoarthritis, unspecified site: Secondary | ICD-10-CM | POA: Diagnosis not present

## 2015-09-08 DIAGNOSIS — S0232XD Fracture of orbital floor, left side, subsequent encounter for fracture with routine healing: Secondary | ICD-10-CM | POA: Diagnosis not present

## 2015-09-08 DIAGNOSIS — F0390 Unspecified dementia without behavioral disturbance: Secondary | ICD-10-CM | POA: Diagnosis not present

## 2015-09-08 DIAGNOSIS — S42302D Unspecified fracture of shaft of humerus, left arm, subsequent encounter for fracture with routine healing: Secondary | ICD-10-CM | POA: Diagnosis not present

## 2015-09-08 DIAGNOSIS — S42202D Unspecified fracture of upper end of left humerus, subsequent encounter for fracture with routine healing: Secondary | ICD-10-CM | POA: Diagnosis not present

## 2015-09-08 DIAGNOSIS — M6281 Muscle weakness (generalized): Secondary | ICD-10-CM | POA: Diagnosis not present

## 2015-09-09 DIAGNOSIS — S42202D Unspecified fracture of upper end of left humerus, subsequent encounter for fracture with routine healing: Secondary | ICD-10-CM | POA: Diagnosis not present

## 2015-09-09 DIAGNOSIS — M1990 Unspecified osteoarthritis, unspecified site: Secondary | ICD-10-CM | POA: Diagnosis not present

## 2015-09-09 DIAGNOSIS — M6281 Muscle weakness (generalized): Secondary | ICD-10-CM | POA: Diagnosis not present

## 2015-09-09 DIAGNOSIS — S0232XD Fracture of orbital floor, left side, subsequent encounter for fracture with routine healing: Secondary | ICD-10-CM | POA: Diagnosis not present

## 2015-09-09 DIAGNOSIS — F0390 Unspecified dementia without behavioral disturbance: Secondary | ICD-10-CM | POA: Diagnosis not present

## 2015-09-09 DIAGNOSIS — S42302D Unspecified fracture of shaft of humerus, left arm, subsequent encounter for fracture with routine healing: Secondary | ICD-10-CM | POA: Diagnosis not present

## 2015-09-14 DIAGNOSIS — M1990 Unspecified osteoarthritis, unspecified site: Secondary | ICD-10-CM | POA: Diagnosis not present

## 2015-09-14 DIAGNOSIS — M6281 Muscle weakness (generalized): Secondary | ICD-10-CM | POA: Diagnosis not present

## 2015-09-14 DIAGNOSIS — S42202D Unspecified fracture of upper end of left humerus, subsequent encounter for fracture with routine healing: Secondary | ICD-10-CM | POA: Diagnosis not present

## 2015-09-14 DIAGNOSIS — F0390 Unspecified dementia without behavioral disturbance: Secondary | ICD-10-CM | POA: Diagnosis not present

## 2015-09-14 DIAGNOSIS — S42302D Unspecified fracture of shaft of humerus, left arm, subsequent encounter for fracture with routine healing: Secondary | ICD-10-CM | POA: Diagnosis not present

## 2015-09-14 DIAGNOSIS — S0232XD Fracture of orbital floor, left side, subsequent encounter for fracture with routine healing: Secondary | ICD-10-CM | POA: Diagnosis not present

## 2015-09-16 DIAGNOSIS — M1990 Unspecified osteoarthritis, unspecified site: Secondary | ICD-10-CM | POA: Diagnosis not present

## 2015-09-16 DIAGNOSIS — S0232XD Fracture of orbital floor, left side, subsequent encounter for fracture with routine healing: Secondary | ICD-10-CM | POA: Diagnosis not present

## 2015-09-16 DIAGNOSIS — F0390 Unspecified dementia without behavioral disturbance: Secondary | ICD-10-CM | POA: Diagnosis not present

## 2015-09-16 DIAGNOSIS — S42202D Unspecified fracture of upper end of left humerus, subsequent encounter for fracture with routine healing: Secondary | ICD-10-CM | POA: Diagnosis not present

## 2015-09-16 DIAGNOSIS — M6281 Muscle weakness (generalized): Secondary | ICD-10-CM | POA: Diagnosis not present

## 2015-09-16 DIAGNOSIS — S42302D Unspecified fracture of shaft of humerus, left arm, subsequent encounter for fracture with routine healing: Secondary | ICD-10-CM | POA: Diagnosis not present

## 2015-09-22 DIAGNOSIS — M6281 Muscle weakness (generalized): Secondary | ICD-10-CM | POA: Diagnosis not present

## 2015-09-22 DIAGNOSIS — H401122 Primary open-angle glaucoma, left eye, moderate stage: Secondary | ICD-10-CM | POA: Diagnosis not present

## 2015-09-22 DIAGNOSIS — F0390 Unspecified dementia without behavioral disturbance: Secondary | ICD-10-CM | POA: Diagnosis not present

## 2015-09-22 DIAGNOSIS — H02403 Unspecified ptosis of bilateral eyelids: Secondary | ICD-10-CM | POA: Diagnosis not present

## 2015-09-22 DIAGNOSIS — M1990 Unspecified osteoarthritis, unspecified site: Secondary | ICD-10-CM | POA: Diagnosis not present

## 2015-09-22 DIAGNOSIS — H532 Diplopia: Secondary | ICD-10-CM | POA: Diagnosis not present

## 2015-09-22 DIAGNOSIS — S42202D Unspecified fracture of upper end of left humerus, subsequent encounter for fracture with routine healing: Secondary | ICD-10-CM | POA: Diagnosis not present

## 2015-09-22 DIAGNOSIS — H01003 Unspecified blepharitis right eye, unspecified eyelid: Secondary | ICD-10-CM | POA: Diagnosis not present

## 2015-09-22 DIAGNOSIS — H401112 Primary open-angle glaucoma, right eye, moderate stage: Secondary | ICD-10-CM | POA: Diagnosis not present

## 2015-09-22 DIAGNOSIS — S42302D Unspecified fracture of shaft of humerus, left arm, subsequent encounter for fracture with routine healing: Secondary | ICD-10-CM | POA: Diagnosis not present

## 2015-09-22 DIAGNOSIS — S0232XD Fracture of orbital floor, left side, subsequent encounter for fracture with routine healing: Secondary | ICD-10-CM | POA: Diagnosis not present

## 2015-09-22 DIAGNOSIS — H02833 Dermatochalasis of right eye, unspecified eyelid: Secondary | ICD-10-CM | POA: Diagnosis not present

## 2015-09-23 ENCOUNTER — Other Ambulatory Visit: Payer: Self-pay | Admitting: *Deleted

## 2015-09-23 MED ORDER — HYDROCODONE-ACETAMINOPHEN 5-325 MG PO TABS: 1.0000 | ORAL_TABLET | ORAL | Status: DC | PRN

## 2015-09-23 NOTE — Telephone Encounter (Signed)
[facility]

## 2015-09-24 DIAGNOSIS — M6281 Muscle weakness (generalized): Secondary | ICD-10-CM | POA: Diagnosis not present

## 2015-09-24 DIAGNOSIS — M1990 Unspecified osteoarthritis, unspecified site: Secondary | ICD-10-CM | POA: Diagnosis not present

## 2015-09-24 DIAGNOSIS — S42202D Unspecified fracture of upper end of left humerus, subsequent encounter for fracture with routine healing: Secondary | ICD-10-CM | POA: Diagnosis not present

## 2015-09-24 DIAGNOSIS — S0232XD Fracture of orbital floor, left side, subsequent encounter for fracture with routine healing: Secondary | ICD-10-CM | POA: Diagnosis not present

## 2015-09-24 DIAGNOSIS — S42302D Unspecified fracture of shaft of humerus, left arm, subsequent encounter for fracture with routine healing: Secondary | ICD-10-CM | POA: Diagnosis not present

## 2015-09-24 DIAGNOSIS — F0390 Unspecified dementia without behavioral disturbance: Secondary | ICD-10-CM | POA: Diagnosis not present

## 2015-09-27 ENCOUNTER — Other Ambulatory Visit: Payer: Self-pay | Admitting: Internal Medicine

## 2015-09-27 DIAGNOSIS — S42202D Unspecified fracture of upper end of left humerus, subsequent encounter for fracture with routine healing: Secondary | ICD-10-CM | POA: Diagnosis not present

## 2015-09-27 DIAGNOSIS — S42302D Unspecified fracture of shaft of humerus, left arm, subsequent encounter for fracture with routine healing: Secondary | ICD-10-CM | POA: Diagnosis not present

## 2015-09-27 DIAGNOSIS — F0390 Unspecified dementia without behavioral disturbance: Secondary | ICD-10-CM | POA: Diagnosis not present

## 2015-09-27 DIAGNOSIS — M1990 Unspecified osteoarthritis, unspecified site: Secondary | ICD-10-CM | POA: Diagnosis not present

## 2015-09-27 DIAGNOSIS — S0232XD Fracture of orbital floor, left side, subsequent encounter for fracture with routine healing: Secondary | ICD-10-CM | POA: Diagnosis not present

## 2015-09-27 DIAGNOSIS — S42342D Displaced spiral fracture of shaft of humerus, left arm, subsequent encounter for fracture with routine healing: Secondary | ICD-10-CM | POA: Diagnosis not present

## 2015-09-27 DIAGNOSIS — M6281 Muscle weakness (generalized): Secondary | ICD-10-CM | POA: Diagnosis not present

## 2015-09-29 DIAGNOSIS — S42302D Unspecified fracture of shaft of humerus, left arm, subsequent encounter for fracture with routine healing: Secondary | ICD-10-CM | POA: Diagnosis not present

## 2015-09-29 DIAGNOSIS — S42202D Unspecified fracture of upper end of left humerus, subsequent encounter for fracture with routine healing: Secondary | ICD-10-CM | POA: Diagnosis not present

## 2015-09-29 DIAGNOSIS — F0390 Unspecified dementia without behavioral disturbance: Secondary | ICD-10-CM | POA: Diagnosis not present

## 2015-09-29 DIAGNOSIS — M1990 Unspecified osteoarthritis, unspecified site: Secondary | ICD-10-CM | POA: Diagnosis not present

## 2015-09-29 DIAGNOSIS — M6281 Muscle weakness (generalized): Secondary | ICD-10-CM | POA: Diagnosis not present

## 2015-09-29 DIAGNOSIS — S0232XD Fracture of orbital floor, left side, subsequent encounter for fracture with routine healing: Secondary | ICD-10-CM | POA: Diagnosis not present

## 2015-10-04 ENCOUNTER — Other Ambulatory Visit: Payer: Self-pay | Admitting: *Deleted

## 2015-10-04 ENCOUNTER — Ambulatory Visit (INDEPENDENT_AMBULATORY_CARE_PROVIDER_SITE_OTHER): Payer: Medicare Other

## 2015-10-04 DIAGNOSIS — M810 Age-related osteoporosis without current pathological fracture: Secondary | ICD-10-CM | POA: Diagnosis not present

## 2015-10-04 DIAGNOSIS — E538 Deficiency of other specified B group vitamins: Secondary | ICD-10-CM | POA: Diagnosis not present

## 2015-10-04 MED ORDER — HYDROCODONE-ACETAMINOPHEN 5-325 MG PO TABS: 1.0000 | ORAL_TABLET | ORAL | Status: DC | PRN

## 2015-10-04 MED ORDER — CYANOCOBALAMIN 1000 MCG/ML IJ SOLN
1000.0000 ug | Freq: Once | INTRAMUSCULAR | Status: AC
  Administered 2015-10-04: 1000 ug via INTRAMUSCULAR

## 2015-10-04 MED ORDER — DENOSUMAB 60 MG/ML ~~LOC~~ SOLN
60.0000 mg | Freq: Once | SUBCUTANEOUS | Status: AC
  Administered 2015-10-04: 60 mg via SUBCUTANEOUS

## 2015-10-05 DIAGNOSIS — S42302D Unspecified fracture of shaft of humerus, left arm, subsequent encounter for fracture with routine healing: Secondary | ICD-10-CM | POA: Diagnosis not present

## 2015-10-05 DIAGNOSIS — M1990 Unspecified osteoarthritis, unspecified site: Secondary | ICD-10-CM | POA: Diagnosis not present

## 2015-10-05 DIAGNOSIS — S42202D Unspecified fracture of upper end of left humerus, subsequent encounter for fracture with routine healing: Secondary | ICD-10-CM | POA: Diagnosis not present

## 2015-10-05 DIAGNOSIS — S0232XD Fracture of orbital floor, left side, subsequent encounter for fracture with routine healing: Secondary | ICD-10-CM | POA: Diagnosis not present

## 2015-10-05 DIAGNOSIS — F0390 Unspecified dementia without behavioral disturbance: Secondary | ICD-10-CM | POA: Diagnosis not present

## 2015-10-05 DIAGNOSIS — M6281 Muscle weakness (generalized): Secondary | ICD-10-CM | POA: Diagnosis not present

## 2015-10-07 DIAGNOSIS — S42302D Unspecified fracture of shaft of humerus, left arm, subsequent encounter for fracture with routine healing: Secondary | ICD-10-CM | POA: Diagnosis not present

## 2015-10-07 DIAGNOSIS — M1990 Unspecified osteoarthritis, unspecified site: Secondary | ICD-10-CM | POA: Diagnosis not present

## 2015-10-07 DIAGNOSIS — M6281 Muscle weakness (generalized): Secondary | ICD-10-CM | POA: Diagnosis not present

## 2015-10-07 DIAGNOSIS — S0232XD Fracture of orbital floor, left side, subsequent encounter for fracture with routine healing: Secondary | ICD-10-CM | POA: Diagnosis not present

## 2015-10-07 DIAGNOSIS — F0390 Unspecified dementia without behavioral disturbance: Secondary | ICD-10-CM | POA: Diagnosis not present

## 2015-10-07 DIAGNOSIS — S42202D Unspecified fracture of upper end of left humerus, subsequent encounter for fracture with routine healing: Secondary | ICD-10-CM | POA: Diagnosis not present

## 2015-11-04 ENCOUNTER — Other Ambulatory Visit: Payer: Self-pay | Admitting: *Deleted

## 2015-11-04 ENCOUNTER — Ambulatory Visit (INDEPENDENT_AMBULATORY_CARE_PROVIDER_SITE_OTHER): Payer: Medicare Other

## 2015-11-04 DIAGNOSIS — E538 Deficiency of other specified B group vitamins: Secondary | ICD-10-CM

## 2015-11-04 MED ORDER — HYDROCODONE-ACETAMINOPHEN 5-325 MG PO TABS: 1.0000 | ORAL_TABLET | ORAL | Status: DC | PRN

## 2015-11-04 MED ORDER — CYANOCOBALAMIN 1000 MCG/ML IJ SOLN
1000.0000 ug | Freq: Once | INTRAMUSCULAR | Status: AC
  Administered 2015-11-04: 1000 ug via INTRAMUSCULAR

## 2015-11-04 NOTE — Telephone Encounter (Signed)
Patient daughter requested and will pick up 

## 2015-11-09 ENCOUNTER — Other Ambulatory Visit: Payer: Self-pay | Admitting: *Deleted

## 2015-11-09 MED ORDER — HYDROCODONE-ACETAMINOPHEN 5-325 MG PO TABS: ORAL_TABLET | ORAL | Status: DC

## 2015-11-09 MED ORDER — LORAZEPAM 0.5 MG PO TABS: 0.5000 mg | ORAL_TABLET | Freq: Every day | ORAL | Status: DC

## 2015-11-09 NOTE — Telephone Encounter (Signed)
[facility] Fax order

## 2015-11-25 ENCOUNTER — Telehealth: Payer: Self-pay

## 2015-11-25 ENCOUNTER — Ambulatory Visit (INDEPENDENT_AMBULATORY_CARE_PROVIDER_SITE_OTHER): Payer: Medicare Other | Admitting: Internal Medicine

## 2015-11-25 ENCOUNTER — Encounter: Payer: Self-pay | Admitting: Internal Medicine

## 2015-11-25 VITALS — BP 128/76 | HR 70 | Temp 97.6°F | Resp 20 | Ht 61.0 in | Wt 115.0 lb

## 2015-11-25 DIAGNOSIS — G6289 Other specified polyneuropathies: Secondary | ICD-10-CM

## 2015-11-25 DIAGNOSIS — E038 Other specified hypothyroidism: Secondary | ICD-10-CM

## 2015-11-25 DIAGNOSIS — F409 Phobic anxiety disorder, unspecified: Secondary | ICD-10-CM | POA: Diagnosis not present

## 2015-11-25 DIAGNOSIS — D519 Vitamin B12 deficiency anemia, unspecified: Secondary | ICD-10-CM

## 2015-11-25 DIAGNOSIS — F0391 Unspecified dementia with behavioral disturbance: Secondary | ICD-10-CM | POA: Diagnosis not present

## 2015-11-25 DIAGNOSIS — F5105 Insomnia due to other mental disorder: Secondary | ICD-10-CM | POA: Diagnosis not present

## 2015-11-25 DIAGNOSIS — E034 Atrophy of thyroid (acquired): Secondary | ICD-10-CM | POA: Diagnosis not present

## 2015-11-25 DIAGNOSIS — M810 Age-related osteoporosis without current pathological fracture: Secondary | ICD-10-CM | POA: Diagnosis not present

## 2015-11-25 MED ORDER — LORAZEPAM 0.5 MG PO TABS: 0.5000 mg | ORAL_TABLET | Freq: Every day | ORAL | Status: DC

## 2015-11-25 MED ORDER — VITAMIN D 50 MCG (2000 UT) PO TABS: ORAL_TABLET | ORAL | Status: AC

## 2015-11-25 MED ORDER — LOVASTATIN 20 MG PO TABS: 20.0000 mg | ORAL_TABLET | Freq: Every evening | ORAL | Status: DC

## 2015-11-25 MED ORDER — AMLODIPINE BESYLATE 5 MG PO TABS: 5.0000 mg | ORAL_TABLET | Freq: Every day | ORAL | Status: DC

## 2015-11-25 MED ORDER — PREGABALIN 50 MG PO CAPS: 50.0000 mg | ORAL_CAPSULE | Freq: Every day | ORAL | Status: DC

## 2015-11-25 MED ORDER — QUETIAPINE FUMARATE 50 MG PO TABS: 50.0000 mg | ORAL_TABLET | Freq: Every day | ORAL | Status: DC

## 2015-11-25 MED ORDER — LEVOTHYROXINE SODIUM 100 MCG PO TABS: 100.0000 ug | ORAL_TABLET | Freq: Every day | ORAL | Status: DC

## 2015-11-25 MED ORDER — SERTRALINE HCL 100 MG PO TABS: ORAL_TABLET | ORAL | Status: DC

## 2015-11-25 MED ORDER — LATANOPROST 0.005 % OP SOLN: 1.0000 [drp] | Freq: Every day | OPHTHALMIC | Status: DC

## 2015-11-25 NOTE — Telephone Encounter (Signed)
I called Jane [name] to notify her that the Rx for Lorazepam 0.5 mg has been approved and also to verify which pharmacy she wishes to use.

## 2015-11-25 NOTE — Patient Instructions (Signed)
Try lyrica for pain in legs.  Stop myrbetriq.   Weighted utensils would help tremor

## 2015-11-25 NOTE — Telephone Encounter (Signed)
Patients daughter called returning phone call from our office about getting a prior Auth. For her mother's lorazepam  Read terry's notes and she said she already had a hard copy and would pick it up her self.

## 2015-11-25 NOTE — Progress Notes (Signed)
Patient ID: Jane [name], female   DOB: 05-24-1925, 82 y.o.   MRN: [id number]   Location: [facility] Provider: Gwenith Spitz. Renato Gails, D.O., C.M.D.  Code Status: DNR Goals of Care: Advanced Directive information Does patient have an advance directive?: Yes, Type of Advance Directive: Healthcare Power of Attorney, Does patient want to make changes to advanced directive?: No - Patient declined  Chief Complaint  Patient presents with  . Medical Management of Chronic Issues    3 month follow-up  . OTHER    Daughter in room with patient    HPI: Patient is a 82 y.o. female seen in the office today for med mgt of chronic diseases.  Jane [name] is here with her.   Increasing tremor.  Years ago it was felt to be due to arthritis in her neck.  Some at rest, but worse with action.  Discussed that seroquel may worsen but she needs it.  Also discussed weighted utensils as a possible intervention.    Left leg is still painful and in her foot.  PT spoke with Jane [name] and thought maybe she could benefit from a med like lyrica that would be cheaper.  Pain seemed more neuropathic to her.    They are switching pharmacies to [facility] b/c she had difficulty with the pharmacy used at [facility].    Myrbetriq is not even on her formulary now.  Nursing will monitor her incontinence.  Even with the myrbetriq, incontinence is bad.  She does not sleep at night w/o her lorazepam.  It is on formulary.  belsomra is also class 2 so she'd have to pay 25%.    Review of Systems:  Review of Systems  Constitutional: Negative for fever, chills and malaise/fatigue.  HENT: Positive for hearing loss.   Eyes:       Glasses  Respiratory: Negative for cough and shortness of breath.   Cardiovascular: Negative for chest pain and leg swelling.  Gastrointestinal: Positive for constipation. Negative for abdominal pain, blood in stool and melena.  Genitourinary: Positive for urgency and frequency. Negative for dysuria.    Musculoskeletal: Positive for back pain, joint pain, falls and neck pain.  Skin: Negative for itching and rash.  Neurological: Positive for tingling, tremors and sensory change.       Left leg and both feet to some extent  Endo/Heme/Allergies: Bruises/bleeds easily.  Psychiatric/Behavioral: Positive for hallucinations and memory loss. The patient has insomnia.     Past Medical History  Diagnosis Date  . Hypertension   . Impacted cerumen   . Abnormality of gait   . Anal fissure   . Loss of weight   . Hyposmolality and/or hyponatremia   . Pain in limb   . Other vitamin B12 deficiency anemia   . Chest pain, unspecified   . Anemia, unspecified   . Anxiety state, unspecified   . Abdominal pain, right upper quadrant   . Other pulmonary embolism and infarction   . Intestinal or peritoneal adhesions with obstruction (postoperative) (postinfection) (HCC)   . Long term (current) use of anticoagulants   . Abdominal pain, right lower quadrant   . Essential and other specified forms of tremor   . Lumbago   . Unspecified hypothyroidism   . Major depressive disorder, single episode, unspecified (HCC)   . Other and unspecified hyperlipidemia   . Macular degeneration (senile) of retina, unspecified   . Unspecified glaucoma   . Unspecified cataract   . Unspecified hearing loss   . Unspecified essential hypertension   .  Osteoarthrosis, unspecified whether generalized or localized, unspecified site   . Osteoporosis, senile   . Abnormal involuntary movements(781.0)   . Emphysema of lung (HCC)   . CAP (community acquired pneumonia) 01/31/2014  . Postoperative anemia due to acute blood loss 01/31/2014  . Laceration of left ear canal 04/13/2014  . Closed left hip fracture (HCC) 06/25/2014  . Gastritis 02/13/2013  . Hyperlipidemia   . Fracture of orbital floor, left, closed 06/18/2015  . Humerus fracture   . Closed fracture of maxillary sinus, left 06/18/2015  . Vitamin B 12 deficiency 06/19/2015     Past Surgical History  Procedure Laterality Date  . Excisional hemorrhoidectomy  1980  . Bladder suspension  1993  . Back surgery  2003  . Abdominal hysterectomy    . Trigger finger release  2009    Dr Teressa Senter  . Intramedullary (im) nail intertrochanteric Right 01/27/2014    Procedure: INTRAMEDULLARY (IM) NAIL INTERTROCHANTRIC;  Surgeon: Sheral Apley, MD;  Location: [facility] OR;  Service: Orthopedics;  Laterality: Right;  . Intramedullary (im) nail intertrochanteric Left 06/26/2014    Procedure: LEFT HIP INTRAMEDULLARY (IM) NAIL;  Surgeon: Cheral Almas, MD;  Location: [facility] OR;  Service: Orthopedics;  Laterality: Left;    Allergies  Allergen Reactions  . Celebrex [Celecoxib]     unknown  . Chlorzoxazone     unknown  . Fentanyl Anxiety      Medication List       This list is accurate as of: 11/25/15  1:36 PM.  Always use your most recent med list.               acetaminophen 325 MG tablet  Commonly known as:  TYLENOL  Take 2 tablets (650 mg total) by mouth every 6 (six) hours as needed for mild pain (or Fever >/= 101).     albuterol 108 (90 Base) MCG/ACT inhaler  Commonly known as:  PROVENTIL HFA;VENTOLIN HFA  Inhale 2 puffs into the lungs every 6 (six) hours as needed for wheezing or shortness of breath (Please dispense generic).     amLODipine 5 MG tablet  Commonly known as:  NORVASC  TAKE ONE TABLET BY MOUTH EVERY DAY     calcium carbonate 500 MG chewable tablet  Commonly known as:  TUMS - dosed in mg elemental calcium  Chew 1 tablet by mouth daily as needed for indigestion or heartburn.     calcium-vitamin D 500-200 MG-UNIT tablet  Commonly known as:  OSCAL WITH D  Take 1 tablet by mouth daily.     cyanocobalamin 1000 MCG/ML injection  Commonly known as:  (VITAMIN B-12)  Inject 1,000 mcg into the muscle. One injection every 30 days for 3 months.     fexofenadine 180 MG tablet  Commonly known as:  ALLEGRA  Take 180 mg by mouth daily as needed for  allergies or rhinitis.     HYDROcodone-acetaminophen 5-325 MG tablet  Commonly known as:  NORCO/VICODIN  Take one tablet by mouth twice daily for pain     latanoprost 0.005 % ophthalmic solution  Commonly known as:  XALATAN  Place 1 drop into both eyes at bedtime.     levothyroxine 100 MCG tablet  Commonly known as:  SYNTHROID, LEVOTHROID  Take 100 mcg by mouth daily before breakfast.     LORazepam 0.5 MG tablet  Commonly known as:  ATIVAN  Take 1 tablet (0.5 mg total) by mouth at bedtime.     lovastatin 20 MG tablet  Commonly known as:  MEVACOR  TAKE ONE TABLET BY MOUTH EVERY EVENING     polyethylene glycol packet  Commonly known as:  MIRALAX / GLYCOLAX  Take 17 g by mouth daily.     QUEtiapine 50 MG tablet  Commonly known as:  SEROQUEL  TAKE ONE TABLET BY MOUTH EVERY DAY     sertraline 100 MG tablet  Commonly known as:  ZOLOFT  Take one tablet by mouth once daily     Vitamin D 2000 units tablet  Take one tablet daily by mouth for Vitamin D        Health Maintenance  Topic Date Due  . ZOSTAVAX  08/08/1987  . INFLUENZA VACCINE  06/06/2016  . TETANUS/TDAP  12/17/2021  . DEXA SCAN  Completed  . PNA vac Low Risk Adult  Completed    Physical Exam: Filed Vitals:   11/25/15 1322  BP: 128/76  Pulse: 70  Temp: 97.6 F (36.4 C)  TempSrc: Oral  Resp: 20  Height: 5\' 1"  (1.549 m)  Weight: 115 lb (52.164 kg)  SpO2: 93%   Body mass index is 21.74 kg/(m^2). Physical Exam  Constitutional: She appears well-nourished. No distress.  Cardiovascular: Normal rate, regular rhythm, normal heart sounds and intact distal pulses.   Pulmonary/Chest: Effort normal and breath sounds normal. No respiratory distress. She has no wheezes. She has no rales.  Abdominal: Soft. Bowel sounds are normal.  Musculoskeletal: She exhibits no tenderness.  Came in wheelchair, cannot ambulate safely anymore on her own; left leg shooting pain  Neurological: She is alert.  Skin: Skin is warm  and dry. There is pallor.  Psychiatric: She has a normal mood and affect.    Labs reviewed: Basic Metabolic Panel:  Recent Labs  0145  05/24/15 1611  06/16/15 1510  06/18/15 0956 06/19/15 0751 08/23/15 1632  NA 138  --  142  < >  --   < > 139 141 143  K 3.9  --  4.9  < >  --   < > 3.2* 3.9 4.0  CL 107  --  102  < >  --   < > 106 110 101  CO2 27  --  27  < >  --   < > 28 24 25   GLUCOSE 92  --  100*  < >  --   < > 105* 92 86  BUN 19  --  23  < >  --   < > 17 15 24   CREATININE 0.69  --  0.76  < > 0.83  < > 0.65 0.62 0.76  CALCIUM 9.0  --  9.7  < >  --   < > 8.8* 9.0 9.9  MG 2.0  --   --   --   --   --   --   --   --   PHOS 3.7  --   --   --   --   --   --   --   --   TSH 19.354*  < > 0.849  --  0.329*  --   --   --  0.601  < > = values in this interval not displayed. Liver Function Tests:  Recent Labs  02/26/15 2229 02/27/15 0145 06/18/15 0956  AST 17 17 23   ALT 13 13 12*  ALKPHOS 70 62 53  BILITOT 0.5 0.6 0.6  PROT 6.3 6.1 6.0*  ALBUMIN 4.4 4.1 3.6   No results for input(s):  LIPASE, AMYLASE in the last 8760 hours. No results for input(s): AMMONIA in the last 8760 hours. CBC:  Recent Labs  02/22/15 1420  06/15/15 2348  06/17/15 0540 06/18/15 0956 06/19/15 0751 08/23/15 1632  WBC 6.5  < > 12.3*  < > 6.1 7.1 6.0 4.9  NEUTROABS 4.9  --  10.6*  --   --   --   --  2.9  HGB 12.0  < > 11.2*  < > 8.6* 9.1* 8.7*  --   HCT 35.8  < > 34.4*  < > 26.4* 27.5* 25.8* 37.9  MCV 92  < > 93.0  < > 94.3 93.9 93.5 92  PLT 207  < > 155  < > 163 166 167 203  < > = values in this interval not displayed. Lipid Panel:  Recent Labs  04/08/15 0932  CHOL 148  HDL 50  LDLCALC 80  TRIG 90  CHOLHDL 3.0   Lab Results  Component Value Date   HGBA1C 5.5 02/27/2015    Assessment/Plan 1. Other polyneuropathy (HCC) -we opted to start her on low dose lyrica 50mg  at hs and see if this helps the leg pain in left leg and both feet  2. Hypothyroidism due to acquired atrophy  of thyroid - TSH; Future at [facility] -may need to adjust dose again if abnormal  3. Insomnia due to anxiety and fear -cont ativan low dose at bedtime for anxiety and zoloft for depression--she tolerates these ok despite Beer's list for ativan  4. Dementia, with behavioral disturbance -cont AL level care at [facility] due to her mobility more than her cognition at this point--nursing more capable of addressing her medical problems -has some hallucinations that have required seroquel b/c they were scary to her  5. B12 deficiency anemia - f/u labs - B12 and Folate Panel; Future - Basic metabolic panel; Future - CBC with Differential/Platelet; Future  6. Senile osteoporosis -I believe [facility] was going to get this scheduled (appt got missed)  Labs/tests ordered:   Orders Placed This Encounter  Procedures  . B12 and Folate Panel    Standing Status: Future     Number of Occurrences:      Standing Expiration Date: 02/23/2016  . TSH    Standing Status: Future     Number of Occurrences:      Standing Expiration Date: 02/23/2016  . Basic metabolic panel    Standing Status: Future     Number of Occurrences:      Standing Expiration Date: 02/23/2016  . CBC with Differential/Platelet    Standing Status: Future     Number of Occurrences:      Standing Expiration Date: 02/23/2016    Next appt:  02/21/2016 med mgt   Salome Cozby L. Dashauna Heymann, D.O. Geriatrics 02/23/2016 [facility] [address] [place], [place] Cell Phone (Mon-Fri 8am-5pm):  [phone] On Call:  [phone] & follow prompts after 5pm & weekends Office Phone:  [phone] Office Fax:  [phone]

## 2015-11-25 NOTE — Telephone Encounter (Signed)
Pt delivered a letter from her insurance company that stated that prior authorization was needed for Lorazepam 0.5MG , take one tablet (0.5mg  total) by mouth at bedtime. I initiated prior authorization by calling [phone]. Patient ID # [id number].   I spoke with representative Tykethia, and prior authorization was approved for Lorazepam 0.5mg  until 11/24/2016. Case # [id number]. I have contacted the pharmacy on file to inform them to resubmit the Rx, however, they state that they have not received a new Rx for this medication this year.   I attempted to contact the patient to verify which pharmacy that was used but there was no answer at the number on file.

## 2015-11-26 ENCOUNTER — Other Ambulatory Visit: Payer: Self-pay | Admitting: *Deleted

## 2015-11-26 ENCOUNTER — Encounter: Payer: Self-pay | Admitting: *Deleted

## 2015-11-26 LAB — BASIC METABOLIC PANEL
BUN: 19 mg/dL (ref 4–21)
CREATININE: 0.7 mg/dL (ref <=1.1)
Glucose: 84 mg/dL
Potassium: 4.6 mmol/L (ref 3.4–5.3)
Sodium: 139 mmol/L (ref 137–147)

## 2015-11-26 LAB — CBC AND DIFFERENTIAL
HCT: 35 % — AB (ref 36–46)
Hemoglobin: 11.5 g/dL — AB (ref 12.0–16.0)
PLATELETS: 191 10*3/uL (ref 150–399)
WBC: 4.9 10^3/mL

## 2015-11-26 MED ORDER — PREGABALIN 50 MG PO CAPS
ORAL_CAPSULE | ORAL | Status: DC
Start: 2015-11-26 — End: 2016-02-21

## 2015-11-26 MED ORDER — POLYETHYLENE GLYCOL 3350 17 G PO PACK: 17.0000 g | PACK | Freq: Every day | ORAL | Status: DC

## 2015-11-26 NOTE — Telephone Encounter (Signed)
Patient daughter requested refill.  

## 2015-11-26 NOTE — Telephone Encounter (Signed)
[facility] order request for Rx

## 2015-11-27 ENCOUNTER — Encounter: Payer: Self-pay | Admitting: Internal Medicine

## 2015-11-30 ENCOUNTER — Telehealth: Payer: Self-pay | Admitting: *Deleted

## 2015-11-30 MED ORDER — LEVOTHYROXINE SODIUM 88 MCG PO TABS: ORAL_TABLET | ORAL | Status: DC

## 2015-11-30 NOTE — Telephone Encounter (Signed)
Received patient's labwork from Dr. Renato Gails and per Dr. Carmin Muskrat TSH low. Need to decrease Synthroid to daily and send new dose to [facility], send copy of this to [facility].   [name], daughter notified and agreed and will pick up Rx from [facility]. Faxed Bloodwork with directions to [facility].

## 2015-12-02 DIAGNOSIS — R262 Difficulty in walking, not elsewhere classified: Secondary | ICD-10-CM | POA: Diagnosis not present

## 2015-12-02 DIAGNOSIS — R296 Repeated falls: Secondary | ICD-10-CM | POA: Diagnosis not present

## 2015-12-02 DIAGNOSIS — S42201A Unspecified fracture of upper end of right humerus, initial encounter for closed fracture: Secondary | ICD-10-CM | POA: Diagnosis not present

## 2015-12-02 DIAGNOSIS — F0390 Unspecified dementia without behavioral disturbance: Secondary | ICD-10-CM | POA: Diagnosis not present

## 2015-12-02 DIAGNOSIS — M6281 Muscle weakness (generalized): Secondary | ICD-10-CM | POA: Diagnosis not present

## 2015-12-03 DIAGNOSIS — M6281 Muscle weakness (generalized): Secondary | ICD-10-CM | POA: Diagnosis not present

## 2015-12-03 DIAGNOSIS — S42201A Unspecified fracture of upper end of right humerus, initial encounter for closed fracture: Secondary | ICD-10-CM | POA: Diagnosis not present

## 2015-12-03 DIAGNOSIS — R262 Difficulty in walking, not elsewhere classified: Secondary | ICD-10-CM | POA: Diagnosis not present

## 2015-12-03 DIAGNOSIS — F0390 Unspecified dementia without behavioral disturbance: Secondary | ICD-10-CM | POA: Diagnosis not present

## 2015-12-03 DIAGNOSIS — R296 Repeated falls: Secondary | ICD-10-CM | POA: Diagnosis not present

## 2015-12-09 ENCOUNTER — Other Ambulatory Visit: Payer: Self-pay

## 2015-12-09 MED ORDER — HYDROCODONE-ACETAMINOPHEN 5-325 MG PO TABS: ORAL_TABLET | ORAL | Status: DC

## 2015-12-10 DIAGNOSIS — R262 Difficulty in walking, not elsewhere classified: Secondary | ICD-10-CM | POA: Diagnosis not present

## 2015-12-10 DIAGNOSIS — M6281 Muscle weakness (generalized): Secondary | ICD-10-CM | POA: Diagnosis not present

## 2015-12-10 DIAGNOSIS — S42201A Unspecified fracture of upper end of right humerus, initial encounter for closed fracture: Secondary | ICD-10-CM | POA: Diagnosis not present

## 2015-12-10 DIAGNOSIS — F0390 Unspecified dementia without behavioral disturbance: Secondary | ICD-10-CM | POA: Diagnosis not present

## 2015-12-10 DIAGNOSIS — R279 Unspecified lack of coordination: Secondary | ICD-10-CM | POA: Diagnosis not present

## 2015-12-10 DIAGNOSIS — R296 Repeated falls: Secondary | ICD-10-CM | POA: Diagnosis not present

## 2015-12-14 DIAGNOSIS — R296 Repeated falls: Secondary | ICD-10-CM | POA: Diagnosis not present

## 2015-12-14 DIAGNOSIS — R262 Difficulty in walking, not elsewhere classified: Secondary | ICD-10-CM | POA: Diagnosis not present

## 2015-12-14 DIAGNOSIS — F0390 Unspecified dementia without behavioral disturbance: Secondary | ICD-10-CM | POA: Diagnosis not present

## 2015-12-14 DIAGNOSIS — R279 Unspecified lack of coordination: Secondary | ICD-10-CM | POA: Diagnosis not present

## 2015-12-14 DIAGNOSIS — M6281 Muscle weakness (generalized): Secondary | ICD-10-CM | POA: Diagnosis not present

## 2015-12-14 DIAGNOSIS — S42201A Unspecified fracture of upper end of right humerus, initial encounter for closed fracture: Secondary | ICD-10-CM | POA: Diagnosis not present

## 2015-12-15 ENCOUNTER — Encounter: Payer: Self-pay | Admitting: Internal Medicine

## 2015-12-15 ENCOUNTER — Ambulatory Visit (INDEPENDENT_AMBULATORY_CARE_PROVIDER_SITE_OTHER): Payer: Medicare Other | Admitting: Internal Medicine

## 2015-12-15 VITALS — BP 132/66 | HR 64 | Temp 97.9°F | Resp 20 | Ht 61.0 in | Wt 117.4 lb

## 2015-12-15 DIAGNOSIS — Y92009 Unspecified place in unspecified non-institutional (private) residence as the place of occurrence of the external cause: Secondary | ICD-10-CM

## 2015-12-15 DIAGNOSIS — H60391 Other infective otitis externa, right ear: Secondary | ICD-10-CM

## 2015-12-15 DIAGNOSIS — Y92099 Unspecified place in other non-institutional residence as the place of occurrence of the external cause: Secondary | ICD-10-CM

## 2015-12-15 DIAGNOSIS — W19XXXA Unspecified fall, initial encounter: Secondary | ICD-10-CM

## 2015-12-15 DIAGNOSIS — F0391 Unspecified dementia with behavioral disturbance: Secondary | ICD-10-CM

## 2015-12-15 DIAGNOSIS — J302 Other seasonal allergic rhinitis: Secondary | ICD-10-CM | POA: Diagnosis not present

## 2015-12-15 MED ORDER — NEOMYCIN-POLYMYXIN-HC 3.5-10000-1 OT SOLN: 3.0000 [drp] | Freq: Four times a day (QID) | OTIC | Status: DC

## 2015-12-15 NOTE — Progress Notes (Signed)
Patient ID: Jane [name], female   DOB: August 21, 1925, 82 y.o.   MRN: [id number]    Location:    [facility]   Place of Service:  OFFICE   Chief Complaint  Patient presents with  . Acute Visit    Ear ache Possible  ear wax or infection from fall  . OTHER    Daughter in with patient    HPI:  82 yo female who lives at [facility] seen today for earache since Mon Feb 6th. It began bleeding yesterday. Nursing attempted to remove wax on Monday. C/a possible infection. She denies sinus/nasal congestion but has post nasal drip. No sore throat. No f/c. No obvious swollen neck glands. She has an intermittent productive cough related to seasonal allergy but not worse than usual.She has a hx seasonal allergy. She has hearing loss and usually wears hearing aids. She admits to attempting to clean ears out with her fingernails  She is a poor historian due to psych d/o/dementia. Hx obtained from daughter  Past Medical History  Diagnosis Date  . Hypertension   . Impacted cerumen   . Abnormality of gait   . Anal fissure   . Loss of weight   . Hyposmolality and/or hyponatremia   . Pain in limb   . Other vitamin B12 deficiency anemia   . Chest pain, unspecified   . Anemia, unspecified   . Anxiety state, unspecified   . Abdominal pain, right upper quadrant   . Other pulmonary embolism and infarction   . Intestinal or peritoneal adhesions with obstruction (postoperative) (postinfection) (HCC)   . Long term (current) use of anticoagulants   . Abdominal pain, right lower quadrant   . Essential and other specified forms of tremor   . Lumbago   . Unspecified hypothyroidism   . Major depressive disorder, single episode, unspecified (HCC)   . Other and unspecified hyperlipidemia   . Macular degeneration (senile) of retina, unspecified   . Unspecified glaucoma   . Unspecified cataract   . Unspecified hearing loss   . Unspecified essential hypertension   . Osteoarthrosis, unspecified whether generalized  or localized, unspecified site   . Osteoporosis, senile   . Abnormal involuntary movements(781.0)   . Emphysema of lung (HCC)   . CAP (community acquired pneumonia) 01/31/2014  . Postoperative anemia due to acute blood loss 01/31/2014  . Laceration of left ear canal 04/13/2014  . Closed left hip fracture (HCC) 06/25/2014  . Gastritis 02/13/2013  . Hyperlipidemia   . Fracture of orbital floor, left, closed 06/18/2015  . Humerus fracture   . Closed fracture of maxillary sinus, left 06/18/2015  . Vitamin B 12 deficiency 06/19/2015    Past Surgical History  Procedure Laterality Date  . Excisional hemorrhoidectomy  1980  . Bladder suspension  1993  . Back surgery  2003  . Abdominal hysterectomy    . Trigger finger release  2009    Dr Teressa Senter  . Intramedullary (im) nail intertrochanteric Right 01/27/2014    Procedure: INTRAMEDULLARY (IM) NAIL INTERTROCHANTRIC;  Surgeon: Sheral Apley, MD;  Location: [facility] OR;  Service: Orthopedics;  Laterality: Right;  . Intramedullary (im) nail intertrochanteric Left 06/26/2014    Procedure: LEFT HIP INTRAMEDULLARY (IM) NAIL;  Surgeon: Cheral Almas, MD;  Location: [facility] OR;  Service: Orthopedics;  Laterality: Left;    Patient Care Team: Kermit Balo, DO as PCP - General (Geriatric Medicine) Allyn Kenner, MD as Attending Physician (Ophthalmology) Lenn Sink, DPM as Consulting Physician (Podiatry)  Social History   Social History  . Marital Status: Married    Spouse Name: N/A  . Number of Children: N/A  . Years of Education: N/A   Occupational History  . Not on file.   Social History Main Topics  . Smoking status: Former Smoker -- 40 years    Types: Cigarettes  . Smokeless tobacco: Never Used  . Alcohol Use: No  . Drug Use: No  . Sexual Activity: No   Other Topics Concern  . Not on file   Social History Narrative     reports that she has quit smoking. Her smoking use included Cigarettes. She quit after 40 years of use. She has  never used smokeless tobacco. She reports that she does not drink alcohol or use illicit drugs.  Allergies  Allergen Reactions  . Celebrex [Celecoxib]     unknown  . Chlorzoxazone     unknown  . Fentanyl Anxiety    Medications: Patient's Medications  New Prescriptions   No medications on file  Previous Medications   ACETAMINOPHEN (TYLENOL) 325 MG TABLET    Take 2 tablets (650 mg total) by mouth every 6 (six) hours as needed for mild pain (or Fever >/= 101).   ALBUTEROL (PROVENTIL HFA;VENTOLIN HFA) 108 (90 BASE) MCG/ACT INHALER    Inhale 2 puffs into the lungs every 6 (six) hours as needed for wheezing or shortness of breath (Please dispense generic).   AMLODIPINE (NORVASC) 5 MG TABLET    Take 1 tablet (5 mg total) by mouth daily.   CALCIUM CARBONATE (TUMS - DOSED IN MG ELEMENTAL CALCIUM) 500 MG CHEWABLE TABLET    Chew 1 tablet by mouth daily as needed for indigestion or heartburn.   CALCIUM-VITAMIN D (OSCAL WITH D) 500-200 MG-UNIT PER TABLET    Take 1 tablet by mouth daily.   CHOLECALCIFEROL (VITAMIN D) 2000 UNITS TABLET    Take one tablet daily by mouth for Vitamin D   CYANOCOBALAMIN (,VITAMIN B-12,) 1000 MCG/ML INJECTION    Inject 1,000 mcg into the muscle. One injection every 30 days for 3 months.   FEXOFENADINE (ALLEGRA) 180 MG TABLET    Take 180 mg by mouth daily as needed for allergies or rhinitis.   HYDROCODONE-ACETAMINOPHEN (NORCO/VICODIN) 5-325 MG TABLET    Take one tablet by mouth twice daily for pain   LATANOPROST (XALATAN) 0.005 % OPHTHALMIC SOLUTION    Place 1 drop into both eyes at bedtime.   LEVOTHYROXINE (SYNTHROID, LEVOTHROID) 88 MCG TABLET    Take one tablet by mouth once daily 30 minutes before breakfast for thyroid.   LORAZEPAM (ATIVAN) 0.5 MG TABLET    Take 1 tablet (0.5 mg total) by mouth at bedtime.   LOVASTATIN (MEVACOR) 20 MG TABLET    Take 1 tablet (20 mg total) by mouth every evening.   POLYETHYLENE GLYCOL POWDER (GLYCOLAX/MIRALAX) POWDER    Take 1 capful by  mouth as needed   PREGABALIN (LYRICA) 50 MG CAPSULE    Take one tablet by mouth once daily for peripheral neuropathy   QUETIAPINE (SEROQUEL) 50 MG TABLET    Take 1 tablet (50 mg total) by mouth daily.   SERTRALINE (ZOLOFT) 100 MG TABLET    Take one tablet by mouth once daily  Modified Medications   No medications on file  Discontinued Medications   POLYETHYLENE GLYCOL (MIRALAX / GLYCOLAX) PACKET    Take 17 g by mouth daily.    Review of Systems  Unable to perform ROS: Dementia  Filed Vitals:   12/15/15 0808  BP: 132/66  Pulse: 64  Temp: 97.9 F (36.6 C)  TempSrc: Oral  Resp: 20  Height:  (1.549 m)  Weight: 117 lb 6.4 oz (53.252 kg)  SpO2: 91%   Body mass index is 22.19 kg/(m^2).  Physical Exam  Constitutional: She appears well-developed and well-nourished. No distress.  HENT:  Right frontal yellowish bruising but no hematoma. Right external ear canal swollen, red and TTP. Dried blood in canal. TM intact, dull but no redness. Left TM dull and intact, no redness or bulging. Oropharynx cobblestoning and red but no exudate. No sinus TTP.   Eyes: Pupils are equal, round, and reactive to light. No scleral icterus.  Neck: Neck supple.  Cardiovascular: Normal rate, regular rhythm, normal heart sounds and intact distal pulses.  Exam reveals no gallop and no friction rub.   No murmur heard. No LE edema b/l. No calf TTP  Pulmonary/Chest: Effort normal and breath sounds normal. No respiratory distress. She has no wheezes. She has no rales.  Lymphadenopathy:    She has no cervical adenopathy.  Neurological: She is alert.  Skin: Skin is warm and dry. No rash noted.  Psychiatric: She has a normal mood and affect. Her behavior is normal.     Labs reviewed: Abstract on 11/26/2015  Component Date Value Ref Range Status  . Hemoglobin 11/26/2015 11.5* 12.0 - 16.0 g/dL Final  . HCT 82/95/6213 35* 36 - 46 % Final  . Platelets 11/26/2015 191  150 - 399 K/L Final  . WBC 11/26/2015  4.9   Final  . Glucose 11/26/2015 84   Final  . BUN 11/26/2015 19  4 - 21 mg/dL Final  . Creatinine 08/65/7846 0.7  .5 - 1.1 mg/dL Final  . Potassium 96/29/5284 4.6  3.4 - 5.3 mmol/L Final  . Sodium 11/26/2015 139  137 - 147 mmol/L Final    No results found.   Assessment/Plan   ICD-9-CM ICD-10-CM   1. Otitis, externa, infective, right 380.10 H60.391 neomycin-polymyxin-hydrocortisone (CORTISPORIN) otic solution  2. Other seasonal allergic rhinitis 477.8 J30.2   3. Dementia, with behavioral disturbance 294.21 F03.91   4. Fall at home, initial encounter [id number] W19.XXXA    E849.0 Y92.099    No fingernails in ears!  Use ear drops for 5 days  If no better, may need ENT referral  Take tylenol as needed for pain  Follow up with Dr Renato Gails as scheduled or sooner if not feeling better  [facility] S. Ancil Linsey  [facility] [address] [place], [place] [address] [phone] Cell (Monday-Friday 8 AM - 5 PM) [phone] After 5 PM and follow prompts

## 2015-12-15 NOTE — Patient Instructions (Signed)
No fingernails in ears!  Use ear drops for 5 days  If no better, may need ENT referral  Take tylenol as needed for pain  Follow up with Dr Renato Gails as scheduled or sooner if not feeling better

## 2015-12-16 DIAGNOSIS — F0390 Unspecified dementia without behavioral disturbance: Secondary | ICD-10-CM | POA: Diagnosis not present

## 2015-12-16 DIAGNOSIS — R279 Unspecified lack of coordination: Secondary | ICD-10-CM | POA: Diagnosis not present

## 2015-12-16 DIAGNOSIS — R262 Difficulty in walking, not elsewhere classified: Secondary | ICD-10-CM | POA: Diagnosis not present

## 2015-12-16 DIAGNOSIS — M6281 Muscle weakness (generalized): Secondary | ICD-10-CM | POA: Diagnosis not present

## 2015-12-16 DIAGNOSIS — S42201A Unspecified fracture of upper end of right humerus, initial encounter for closed fracture: Secondary | ICD-10-CM | POA: Diagnosis not present

## 2015-12-16 DIAGNOSIS — R296 Repeated falls: Secondary | ICD-10-CM | POA: Diagnosis not present

## 2015-12-17 DIAGNOSIS — R262 Difficulty in walking, not elsewhere classified: Secondary | ICD-10-CM | POA: Diagnosis not present

## 2015-12-17 DIAGNOSIS — S42201A Unspecified fracture of upper end of right humerus, initial encounter for closed fracture: Secondary | ICD-10-CM | POA: Diagnosis not present

## 2015-12-17 DIAGNOSIS — R296 Repeated falls: Secondary | ICD-10-CM | POA: Diagnosis not present

## 2015-12-17 DIAGNOSIS — F0390 Unspecified dementia without behavioral disturbance: Secondary | ICD-10-CM | POA: Diagnosis not present

## 2015-12-17 DIAGNOSIS — R279 Unspecified lack of coordination: Secondary | ICD-10-CM | POA: Diagnosis not present

## 2015-12-17 DIAGNOSIS — M6281 Muscle weakness (generalized): Secondary | ICD-10-CM | POA: Diagnosis not present

## 2015-12-20 DIAGNOSIS — M6281 Muscle weakness (generalized): Secondary | ICD-10-CM | POA: Diagnosis not present

## 2015-12-20 DIAGNOSIS — R262 Difficulty in walking, not elsewhere classified: Secondary | ICD-10-CM | POA: Diagnosis not present

## 2015-12-20 DIAGNOSIS — S42201A Unspecified fracture of upper end of right humerus, initial encounter for closed fracture: Secondary | ICD-10-CM | POA: Diagnosis not present

## 2015-12-20 DIAGNOSIS — R279 Unspecified lack of coordination: Secondary | ICD-10-CM | POA: Diagnosis not present

## 2015-12-20 DIAGNOSIS — R296 Repeated falls: Secondary | ICD-10-CM | POA: Diagnosis not present

## 2015-12-20 DIAGNOSIS — F0390 Unspecified dementia without behavioral disturbance: Secondary | ICD-10-CM | POA: Diagnosis not present

## 2015-12-21 ENCOUNTER — Other Ambulatory Visit: Payer: Self-pay | Admitting: Internal Medicine

## 2015-12-21 DIAGNOSIS — M6281 Muscle weakness (generalized): Secondary | ICD-10-CM | POA: Diagnosis not present

## 2015-12-21 DIAGNOSIS — R296 Repeated falls: Secondary | ICD-10-CM | POA: Diagnosis not present

## 2015-12-21 DIAGNOSIS — S42201A Unspecified fracture of upper end of right humerus, initial encounter for closed fracture: Secondary | ICD-10-CM | POA: Diagnosis not present

## 2015-12-21 DIAGNOSIS — R262 Difficulty in walking, not elsewhere classified: Secondary | ICD-10-CM | POA: Diagnosis not present

## 2015-12-21 DIAGNOSIS — F0390 Unspecified dementia without behavioral disturbance: Secondary | ICD-10-CM | POA: Diagnosis not present

## 2015-12-21 DIAGNOSIS — R279 Unspecified lack of coordination: Secondary | ICD-10-CM | POA: Diagnosis not present

## 2015-12-23 ENCOUNTER — Other Ambulatory Visit: Payer: Self-pay | Admitting: *Deleted

## 2015-12-23 DIAGNOSIS — F0390 Unspecified dementia without behavioral disturbance: Secondary | ICD-10-CM | POA: Diagnosis not present

## 2015-12-23 DIAGNOSIS — R296 Repeated falls: Secondary | ICD-10-CM | POA: Diagnosis not present

## 2015-12-23 DIAGNOSIS — S42201A Unspecified fracture of upper end of right humerus, initial encounter for closed fracture: Secondary | ICD-10-CM | POA: Diagnosis not present

## 2015-12-23 DIAGNOSIS — R279 Unspecified lack of coordination: Secondary | ICD-10-CM | POA: Diagnosis not present

## 2015-12-23 DIAGNOSIS — M6281 Muscle weakness (generalized): Secondary | ICD-10-CM | POA: Diagnosis not present

## 2015-12-23 DIAGNOSIS — R262 Difficulty in walking, not elsewhere classified: Secondary | ICD-10-CM | POA: Diagnosis not present

## 2015-12-23 MED ORDER — QUETIAPINE FUMARATE 50 MG PO TABS: 50.0000 mg | ORAL_TABLET | Freq: Every day | ORAL | Status: DC

## 2015-12-23 NOTE — Telephone Encounter (Signed)
Jane [name], daughter called and stated that her mother has always taken generic Seroquel and daughter wants that instead of the Brand that was called to pharmacy. She stated that it is too much for the brand. Faxed Rx to pharmacy.

## 2015-12-27 DIAGNOSIS — S42201A Unspecified fracture of upper end of right humerus, initial encounter for closed fracture: Secondary | ICD-10-CM | POA: Diagnosis not present

## 2015-12-27 DIAGNOSIS — F0390 Unspecified dementia without behavioral disturbance: Secondary | ICD-10-CM | POA: Diagnosis not present

## 2015-12-27 DIAGNOSIS — R296 Repeated falls: Secondary | ICD-10-CM | POA: Diagnosis not present

## 2015-12-27 DIAGNOSIS — R279 Unspecified lack of coordination: Secondary | ICD-10-CM | POA: Diagnosis not present

## 2015-12-27 DIAGNOSIS — M6281 Muscle weakness (generalized): Secondary | ICD-10-CM | POA: Diagnosis not present

## 2015-12-27 DIAGNOSIS — R262 Difficulty in walking, not elsewhere classified: Secondary | ICD-10-CM | POA: Diagnosis not present

## 2015-12-28 DIAGNOSIS — R296 Repeated falls: Secondary | ICD-10-CM | POA: Diagnosis not present

## 2015-12-28 DIAGNOSIS — F0390 Unspecified dementia without behavioral disturbance: Secondary | ICD-10-CM | POA: Diagnosis not present

## 2015-12-28 DIAGNOSIS — R262 Difficulty in walking, not elsewhere classified: Secondary | ICD-10-CM | POA: Diagnosis not present

## 2015-12-28 DIAGNOSIS — S42201A Unspecified fracture of upper end of right humerus, initial encounter for closed fracture: Secondary | ICD-10-CM | POA: Diagnosis not present

## 2015-12-28 DIAGNOSIS — M6281 Muscle weakness (generalized): Secondary | ICD-10-CM | POA: Diagnosis not present

## 2015-12-28 DIAGNOSIS — R279 Unspecified lack of coordination: Secondary | ICD-10-CM | POA: Diagnosis not present

## 2015-12-29 DIAGNOSIS — M6281 Muscle weakness (generalized): Secondary | ICD-10-CM | POA: Diagnosis not present

## 2015-12-29 DIAGNOSIS — S42201A Unspecified fracture of upper end of right humerus, initial encounter for closed fracture: Secondary | ICD-10-CM | POA: Diagnosis not present

## 2015-12-29 DIAGNOSIS — R262 Difficulty in walking, not elsewhere classified: Secondary | ICD-10-CM | POA: Diagnosis not present

## 2015-12-29 DIAGNOSIS — R279 Unspecified lack of coordination: Secondary | ICD-10-CM | POA: Diagnosis not present

## 2015-12-29 DIAGNOSIS — F0390 Unspecified dementia without behavioral disturbance: Secondary | ICD-10-CM | POA: Diagnosis not present

## 2015-12-29 DIAGNOSIS — R296 Repeated falls: Secondary | ICD-10-CM | POA: Diagnosis not present

## 2016-01-03 DIAGNOSIS — S42201A Unspecified fracture of upper end of right humerus, initial encounter for closed fracture: Secondary | ICD-10-CM | POA: Diagnosis not present

## 2016-01-03 DIAGNOSIS — M6281 Muscle weakness (generalized): Secondary | ICD-10-CM | POA: Diagnosis not present

## 2016-01-03 DIAGNOSIS — R262 Difficulty in walking, not elsewhere classified: Secondary | ICD-10-CM | POA: Diagnosis not present

## 2016-01-03 DIAGNOSIS — R279 Unspecified lack of coordination: Secondary | ICD-10-CM | POA: Diagnosis not present

## 2016-01-03 DIAGNOSIS — R296 Repeated falls: Secondary | ICD-10-CM | POA: Diagnosis not present

## 2016-01-03 DIAGNOSIS — F0390 Unspecified dementia without behavioral disturbance: Secondary | ICD-10-CM | POA: Diagnosis not present

## 2016-01-04 ENCOUNTER — Telehealth: Payer: Self-pay | Admitting: *Deleted

## 2016-01-04 DIAGNOSIS — N390 Urinary tract infection, site not specified: Secondary | ICD-10-CM | POA: Diagnosis not present

## 2016-01-04 DIAGNOSIS — D649 Anemia, unspecified: Secondary | ICD-10-CM | POA: Diagnosis not present

## 2016-01-04 DIAGNOSIS — R69 Illness, unspecified: Secondary | ICD-10-CM | POA: Diagnosis not present

## 2016-01-04 LAB — BASIC METABOLIC PANEL
BUN: 21 mg/dL (ref 4–21)
Creatinine: 0.7 mg/dL (ref 0.5–1.1)
Glucose: 124 mg/dL
Potassium: 4.2 mmol/L (ref 3.4–5.3)
Sodium: 142 mmol/L (ref 137–147)

## 2016-01-04 LAB — CBC AND DIFFERENTIAL
HCT: 35 % — AB (ref 36–46)
Hemoglobin: 11.8 g/dL — AB (ref 12.0–16.0)
Platelets: 177 10*3/uL (ref 150–399)
WBC: 4.9 10*3/mL

## 2016-01-04 NOTE — Telephone Encounter (Signed)
I had sent a fax response to these concerns asking if she was still on her seroquel and another fax with an order to check her urine, cbc, bmp.  I did not get any responses from those two different faxes.    Was the lyrica helping her nerve pain/burning/tingling?  If it's unclear or not helping, we can stop lyrica and monitor her psychosis.  If she is no better after 3 days off lyrica, then we could increase her seroquel (if she is getting it as directed).    Please also remind them at [facility] that I am only in the office on Mondays and Thursdays all day and Friday mornings.  Non-urgent faxes will not be handled unless I am there.  If something is urgent, a phone call should be made to the office so things are dealt with more quickly.

## 2016-01-04 NOTE — Telephone Encounter (Signed)
Left message with receptionist to return call.

## 2016-01-04 NOTE — Telephone Encounter (Signed)
Spoke with [name] at [facility] Fax#: [phone]. Faxed message.

## 2016-01-04 NOTE — Telephone Encounter (Signed)
[name], nurse called and stated that patient is still having symptoms of Paranoia, Hullicinations, Increased confusion,  participating in less activities, Thinking someone has her phone line tapped, and daughter having an affair and she thinks these things are real. Could the Lyrica be causing these symptoms? Any new Orders?  Please Advise.

## 2016-01-04 NOTE — Telephone Encounter (Signed)
Jane [name] with [facility] called and left message on voicemail to return call regarding some concerns with patient.

## 2016-01-05 DIAGNOSIS — R262 Difficulty in walking, not elsewhere classified: Secondary | ICD-10-CM | POA: Diagnosis not present

## 2016-01-05 DIAGNOSIS — R296 Repeated falls: Secondary | ICD-10-CM | POA: Diagnosis not present

## 2016-01-05 DIAGNOSIS — M6281 Muscle weakness (generalized): Secondary | ICD-10-CM | POA: Diagnosis not present

## 2016-01-05 DIAGNOSIS — S42201A Unspecified fracture of upper end of right humerus, initial encounter for closed fracture: Secondary | ICD-10-CM | POA: Diagnosis not present

## 2016-01-05 DIAGNOSIS — R279 Unspecified lack of coordination: Secondary | ICD-10-CM | POA: Diagnosis not present

## 2016-01-05 DIAGNOSIS — F0390 Unspecified dementia without behavioral disturbance: Secondary | ICD-10-CM | POA: Diagnosis not present

## 2016-01-06 ENCOUNTER — Other Ambulatory Visit: Payer: Self-pay | Admitting: *Deleted

## 2016-01-06 DIAGNOSIS — R262 Difficulty in walking, not elsewhere classified: Secondary | ICD-10-CM | POA: Diagnosis not present

## 2016-01-06 DIAGNOSIS — S42201A Unspecified fracture of upper end of right humerus, initial encounter for closed fracture: Secondary | ICD-10-CM | POA: Diagnosis not present

## 2016-01-06 DIAGNOSIS — M6281 Muscle weakness (generalized): Secondary | ICD-10-CM | POA: Diagnosis not present

## 2016-01-06 DIAGNOSIS — F0390 Unspecified dementia without behavioral disturbance: Secondary | ICD-10-CM | POA: Diagnosis not present

## 2016-01-06 DIAGNOSIS — R296 Repeated falls: Secondary | ICD-10-CM | POA: Diagnosis not present

## 2016-01-06 DIAGNOSIS — R279 Unspecified lack of coordination: Secondary | ICD-10-CM | POA: Diagnosis not present

## 2016-01-06 MED ORDER — HYDROCODONE-ACETAMINOPHEN 5-325 MG PO TABS: ORAL_TABLET | ORAL | Status: DC

## 2016-01-06 NOTE — Telephone Encounter (Signed)
Patient daughter requested and will pick up 

## 2016-01-07 ENCOUNTER — Encounter: Payer: Self-pay | Admitting: *Deleted

## 2016-01-07 ENCOUNTER — Telehealth: Payer: Self-pay | Admitting: *Deleted

## 2016-01-07 DIAGNOSIS — M79672 Pain in left foot: Secondary | ICD-10-CM | POA: Diagnosis not present

## 2016-01-07 DIAGNOSIS — M25572 Pain in left ankle and joints of left foot: Secondary | ICD-10-CM | POA: Diagnosis not present

## 2016-01-07 MED ORDER — QUETIAPINE FUMARATE 50 MG PO TABS: ORAL_TABLET | ORAL | Status: DC

## 2016-01-07 NOTE — Telephone Encounter (Signed)
[place] with [facility] called wanting to clarify orders. Explained to her that Dr. Renato Gails stated that if patient's labwork was normal to Increase patient's Seroquel to 50mg  every am and every hs. Labwork was normal and Dr. signed off of it stating unremarkable. Renato Gails asked if that was normal, confirmed it was and she stated that she will increase patient's medication. Medication list updated.

## 2016-01-10 DIAGNOSIS — F0390 Unspecified dementia without behavioral disturbance: Secondary | ICD-10-CM | POA: Diagnosis not present

## 2016-01-10 DIAGNOSIS — M6281 Muscle weakness (generalized): Secondary | ICD-10-CM | POA: Diagnosis not present

## 2016-01-10 DIAGNOSIS — S42201A Unspecified fracture of upper end of right humerus, initial encounter for closed fracture: Secondary | ICD-10-CM | POA: Diagnosis not present

## 2016-01-10 DIAGNOSIS — R296 Repeated falls: Secondary | ICD-10-CM | POA: Diagnosis not present

## 2016-01-10 DIAGNOSIS — R279 Unspecified lack of coordination: Secondary | ICD-10-CM | POA: Diagnosis not present

## 2016-01-10 DIAGNOSIS — R262 Difficulty in walking, not elsewhere classified: Secondary | ICD-10-CM | POA: Diagnosis not present

## 2016-01-11 ENCOUNTER — Telehealth: Payer: Self-pay

## 2016-01-11 NOTE — Telephone Encounter (Signed)
I called and spoke with patient's daughter, Marlou Starks. I informed her that there was no fracture or break to the foot or ankle. If the ankle continued to swell, then a follow up appointment would be necessary for further evaluation.   Mrs. Sharma Covert stated that she understood. She also stated that the swelling had gone down in her mother's ankle but it if returned then she would schedule follow up appointment.

## 2016-01-13 DIAGNOSIS — S42201A Unspecified fracture of upper end of right humerus, initial encounter for closed fracture: Secondary | ICD-10-CM | POA: Diagnosis not present

## 2016-01-13 DIAGNOSIS — R296 Repeated falls: Secondary | ICD-10-CM | POA: Diagnosis not present

## 2016-01-13 DIAGNOSIS — R279 Unspecified lack of coordination: Secondary | ICD-10-CM | POA: Diagnosis not present

## 2016-01-13 DIAGNOSIS — R262 Difficulty in walking, not elsewhere classified: Secondary | ICD-10-CM | POA: Diagnosis not present

## 2016-01-13 DIAGNOSIS — F0390 Unspecified dementia without behavioral disturbance: Secondary | ICD-10-CM | POA: Diagnosis not present

## 2016-01-13 DIAGNOSIS — M6281 Muscle weakness (generalized): Secondary | ICD-10-CM | POA: Diagnosis not present

## 2016-01-17 DIAGNOSIS — R262 Difficulty in walking, not elsewhere classified: Secondary | ICD-10-CM | POA: Diagnosis not present

## 2016-01-17 DIAGNOSIS — S42201A Unspecified fracture of upper end of right humerus, initial encounter for closed fracture: Secondary | ICD-10-CM | POA: Diagnosis not present

## 2016-01-17 DIAGNOSIS — M6281 Muscle weakness (generalized): Secondary | ICD-10-CM | POA: Diagnosis not present

## 2016-01-17 DIAGNOSIS — F0390 Unspecified dementia without behavioral disturbance: Secondary | ICD-10-CM | POA: Diagnosis not present

## 2016-01-17 DIAGNOSIS — R296 Repeated falls: Secondary | ICD-10-CM | POA: Diagnosis not present

## 2016-01-17 DIAGNOSIS — R279 Unspecified lack of coordination: Secondary | ICD-10-CM | POA: Diagnosis not present

## 2016-01-19 DIAGNOSIS — R262 Difficulty in walking, not elsewhere classified: Secondary | ICD-10-CM | POA: Diagnosis not present

## 2016-01-19 DIAGNOSIS — R296 Repeated falls: Secondary | ICD-10-CM | POA: Diagnosis not present

## 2016-01-19 DIAGNOSIS — F0390 Unspecified dementia without behavioral disturbance: Secondary | ICD-10-CM | POA: Diagnosis not present

## 2016-01-19 DIAGNOSIS — S42201A Unspecified fracture of upper end of right humerus, initial encounter for closed fracture: Secondary | ICD-10-CM | POA: Diagnosis not present

## 2016-01-19 DIAGNOSIS — M6281 Muscle weakness (generalized): Secondary | ICD-10-CM | POA: Diagnosis not present

## 2016-01-19 DIAGNOSIS — R279 Unspecified lack of coordination: Secondary | ICD-10-CM | POA: Diagnosis not present

## 2016-01-20 DIAGNOSIS — R296 Repeated falls: Secondary | ICD-10-CM | POA: Diagnosis not present

## 2016-01-20 DIAGNOSIS — R262 Difficulty in walking, not elsewhere classified: Secondary | ICD-10-CM | POA: Diagnosis not present

## 2016-01-20 DIAGNOSIS — F0390 Unspecified dementia without behavioral disturbance: Secondary | ICD-10-CM | POA: Diagnosis not present

## 2016-01-20 DIAGNOSIS — R279 Unspecified lack of coordination: Secondary | ICD-10-CM | POA: Diagnosis not present

## 2016-01-20 DIAGNOSIS — S42201A Unspecified fracture of upper end of right humerus, initial encounter for closed fracture: Secondary | ICD-10-CM | POA: Diagnosis not present

## 2016-01-20 DIAGNOSIS — M6281 Muscle weakness (generalized): Secondary | ICD-10-CM | POA: Diagnosis not present

## 2016-01-24 DIAGNOSIS — R296 Repeated falls: Secondary | ICD-10-CM | POA: Diagnosis not present

## 2016-01-24 DIAGNOSIS — R279 Unspecified lack of coordination: Secondary | ICD-10-CM | POA: Diagnosis not present

## 2016-01-24 DIAGNOSIS — S42201A Unspecified fracture of upper end of right humerus, initial encounter for closed fracture: Secondary | ICD-10-CM | POA: Diagnosis not present

## 2016-01-24 DIAGNOSIS — M6281 Muscle weakness (generalized): Secondary | ICD-10-CM | POA: Diagnosis not present

## 2016-01-24 DIAGNOSIS — F0390 Unspecified dementia without behavioral disturbance: Secondary | ICD-10-CM | POA: Diagnosis not present

## 2016-01-24 DIAGNOSIS — R262 Difficulty in walking, not elsewhere classified: Secondary | ICD-10-CM | POA: Diagnosis not present

## 2016-01-26 DIAGNOSIS — R279 Unspecified lack of coordination: Secondary | ICD-10-CM | POA: Diagnosis not present

## 2016-01-26 DIAGNOSIS — R296 Repeated falls: Secondary | ICD-10-CM | POA: Diagnosis not present

## 2016-01-26 DIAGNOSIS — F0390 Unspecified dementia without behavioral disturbance: Secondary | ICD-10-CM | POA: Diagnosis not present

## 2016-01-26 DIAGNOSIS — M6281 Muscle weakness (generalized): Secondary | ICD-10-CM | POA: Diagnosis not present

## 2016-01-26 DIAGNOSIS — S42201A Unspecified fracture of upper end of right humerus, initial encounter for closed fracture: Secondary | ICD-10-CM | POA: Diagnosis not present

## 2016-01-26 DIAGNOSIS — R262 Difficulty in walking, not elsewhere classified: Secondary | ICD-10-CM | POA: Diagnosis not present

## 2016-01-27 DIAGNOSIS — R279 Unspecified lack of coordination: Secondary | ICD-10-CM | POA: Diagnosis not present

## 2016-01-27 DIAGNOSIS — R262 Difficulty in walking, not elsewhere classified: Secondary | ICD-10-CM | POA: Diagnosis not present

## 2016-01-27 DIAGNOSIS — S42201A Unspecified fracture of upper end of right humerus, initial encounter for closed fracture: Secondary | ICD-10-CM | POA: Diagnosis not present

## 2016-01-27 DIAGNOSIS — M6281 Muscle weakness (generalized): Secondary | ICD-10-CM | POA: Diagnosis not present

## 2016-01-27 DIAGNOSIS — F0390 Unspecified dementia without behavioral disturbance: Secondary | ICD-10-CM | POA: Diagnosis not present

## 2016-01-27 DIAGNOSIS — R296 Repeated falls: Secondary | ICD-10-CM | POA: Diagnosis not present

## 2016-01-31 DIAGNOSIS — M6281 Muscle weakness (generalized): Secondary | ICD-10-CM | POA: Diagnosis not present

## 2016-01-31 DIAGNOSIS — R279 Unspecified lack of coordination: Secondary | ICD-10-CM | POA: Diagnosis not present

## 2016-01-31 DIAGNOSIS — R262 Difficulty in walking, not elsewhere classified: Secondary | ICD-10-CM | POA: Diagnosis not present

## 2016-01-31 DIAGNOSIS — F0390 Unspecified dementia without behavioral disturbance: Secondary | ICD-10-CM | POA: Diagnosis not present

## 2016-01-31 DIAGNOSIS — R296 Repeated falls: Secondary | ICD-10-CM | POA: Diagnosis not present

## 2016-01-31 DIAGNOSIS — S42201A Unspecified fracture of upper end of right humerus, initial encounter for closed fracture: Secondary | ICD-10-CM | POA: Diagnosis not present

## 2016-02-02 ENCOUNTER — Other Ambulatory Visit: Payer: Self-pay

## 2016-02-02 MED ORDER — QUETIAPINE FUMARATE 50 MG PO TABS: ORAL_TABLET | ORAL | Status: DC

## 2016-02-03 DIAGNOSIS — F0390 Unspecified dementia without behavioral disturbance: Secondary | ICD-10-CM | POA: Diagnosis not present

## 2016-02-03 DIAGNOSIS — M6281 Muscle weakness (generalized): Secondary | ICD-10-CM | POA: Diagnosis not present

## 2016-02-03 DIAGNOSIS — R279 Unspecified lack of coordination: Secondary | ICD-10-CM | POA: Diagnosis not present

## 2016-02-03 DIAGNOSIS — S42201A Unspecified fracture of upper end of right humerus, initial encounter for closed fracture: Secondary | ICD-10-CM | POA: Diagnosis not present

## 2016-02-03 DIAGNOSIS — R296 Repeated falls: Secondary | ICD-10-CM | POA: Diagnosis not present

## 2016-02-03 DIAGNOSIS — R262 Difficulty in walking, not elsewhere classified: Secondary | ICD-10-CM | POA: Diagnosis not present

## 2016-02-04 ENCOUNTER — Other Ambulatory Visit: Payer: Self-pay | Admitting: *Deleted

## 2016-02-04 MED ORDER — HYDROCODONE-ACETAMINOPHEN 5-325 MG PO TABS: ORAL_TABLET | ORAL | Status: DC

## 2016-02-04 NOTE — Telephone Encounter (Signed)
Patient daughter will pick up 

## 2016-02-07 ENCOUNTER — Other Ambulatory Visit: Payer: Self-pay | Admitting: Internal Medicine

## 2016-02-07 DIAGNOSIS — F0390 Unspecified dementia without behavioral disturbance: Secondary | ICD-10-CM | POA: Diagnosis not present

## 2016-02-07 DIAGNOSIS — R279 Unspecified lack of coordination: Secondary | ICD-10-CM | POA: Diagnosis not present

## 2016-02-07 DIAGNOSIS — S42201A Unspecified fracture of upper end of right humerus, initial encounter for closed fracture: Secondary | ICD-10-CM | POA: Diagnosis not present

## 2016-02-07 DIAGNOSIS — R296 Repeated falls: Secondary | ICD-10-CM | POA: Diagnosis not present

## 2016-02-07 DIAGNOSIS — R262 Difficulty in walking, not elsewhere classified: Secondary | ICD-10-CM | POA: Diagnosis not present

## 2016-02-07 DIAGNOSIS — M6281 Muscle weakness (generalized): Secondary | ICD-10-CM | POA: Diagnosis not present

## 2016-02-07 MED ORDER — ACETAMINOPHEN 325 MG PO TABS: 325.0000 mg | ORAL_TABLET | Freq: Three times a day (TID) | ORAL | Status: AC

## 2016-02-07 NOTE — Progress Notes (Signed)
Received paperwork from [facility] with concerns about increased pain in hernia and left posterior knee and calf.  Pain in left leg is chronic. She is on bid hydrocodone for it.  She got some relief with tylenol so will also schedule tylenol 325mg  po tid for now--asked CMA to inform her daughter who gets the medications for pt rather than going through their pharmacy.  Advised [facility] to have nurse evaluate hernia and would need surgery eval if color has changed or it cannot be reduced any longer.  Also advised warm compresses wrapped around posterior knee for pain.

## 2016-02-10 DIAGNOSIS — R296 Repeated falls: Secondary | ICD-10-CM | POA: Diagnosis not present

## 2016-02-10 DIAGNOSIS — R262 Difficulty in walking, not elsewhere classified: Secondary | ICD-10-CM | POA: Diagnosis not present

## 2016-02-10 DIAGNOSIS — F0390 Unspecified dementia without behavioral disturbance: Secondary | ICD-10-CM | POA: Diagnosis not present

## 2016-02-10 DIAGNOSIS — M6281 Muscle weakness (generalized): Secondary | ICD-10-CM | POA: Diagnosis not present

## 2016-02-10 DIAGNOSIS — S42201A Unspecified fracture of upper end of right humerus, initial encounter for closed fracture: Secondary | ICD-10-CM | POA: Diagnosis not present

## 2016-02-10 DIAGNOSIS — R279 Unspecified lack of coordination: Secondary | ICD-10-CM | POA: Diagnosis not present

## 2016-02-11 ENCOUNTER — Other Ambulatory Visit: Payer: Self-pay | Admitting: Internal Medicine

## 2016-02-11 ENCOUNTER — Telehealth: Payer: Self-pay | Admitting: *Deleted

## 2016-02-11 DIAGNOSIS — H60393 Other infective otitis externa, bilateral: Secondary | ICD-10-CM

## 2016-02-11 MED ORDER — ACETIC ACID 2 % OT SOLN: 4.0000 [drp] | Freq: Three times a day (TID) | OTIC | Status: DC

## 2016-02-11 NOTE — Telephone Encounter (Signed)
Pharmacy calling wanting to change the acetic acid 2% to acetic acid 2% with hydrocortisone because the other is not available, per pharmacy only difference is hydrocortisone. Verbal order given.

## 2016-02-11 NOTE — Progress Notes (Signed)
Received fax from [facility] indicating pt is having itchy ears inside and out.  No pain, stable vitals, look dry and are very itchy.  Acetic acid drops ordered.

## 2016-02-15 DIAGNOSIS — M6281 Muscle weakness (generalized): Secondary | ICD-10-CM | POA: Diagnosis not present

## 2016-02-15 DIAGNOSIS — S42201A Unspecified fracture of upper end of right humerus, initial encounter for closed fracture: Secondary | ICD-10-CM | POA: Diagnosis not present

## 2016-02-15 DIAGNOSIS — R279 Unspecified lack of coordination: Secondary | ICD-10-CM | POA: Diagnosis not present

## 2016-02-15 DIAGNOSIS — F0390 Unspecified dementia without behavioral disturbance: Secondary | ICD-10-CM | POA: Diagnosis not present

## 2016-02-15 DIAGNOSIS — R296 Repeated falls: Secondary | ICD-10-CM | POA: Diagnosis not present

## 2016-02-15 DIAGNOSIS — R262 Difficulty in walking, not elsewhere classified: Secondary | ICD-10-CM | POA: Diagnosis not present

## 2016-02-17 DIAGNOSIS — F0390 Unspecified dementia without behavioral disturbance: Secondary | ICD-10-CM | POA: Diagnosis not present

## 2016-02-17 DIAGNOSIS — R296 Repeated falls: Secondary | ICD-10-CM | POA: Diagnosis not present

## 2016-02-17 DIAGNOSIS — M6281 Muscle weakness (generalized): Secondary | ICD-10-CM | POA: Diagnosis not present

## 2016-02-17 DIAGNOSIS — S42201A Unspecified fracture of upper end of right humerus, initial encounter for closed fracture: Secondary | ICD-10-CM | POA: Diagnosis not present

## 2016-02-17 DIAGNOSIS — R279 Unspecified lack of coordination: Secondary | ICD-10-CM | POA: Diagnosis not present

## 2016-02-17 DIAGNOSIS — R262 Difficulty in walking, not elsewhere classified: Secondary | ICD-10-CM | POA: Diagnosis not present

## 2016-02-21 ENCOUNTER — Ambulatory Visit (INDEPENDENT_AMBULATORY_CARE_PROVIDER_SITE_OTHER): Payer: Medicare Other | Admitting: Internal Medicine

## 2016-02-21 ENCOUNTER — Encounter: Payer: Self-pay | Admitting: Internal Medicine

## 2016-02-21 VITALS — BP 120/62 | HR 84 | Temp 98.1°F | Ht 61.0 in | Wt 119.0 lb

## 2016-02-21 DIAGNOSIS — H6123 Impacted cerumen, bilateral: Secondary | ICD-10-CM | POA: Diagnosis not present

## 2016-02-21 DIAGNOSIS — G629 Polyneuropathy, unspecified: Secondary | ICD-10-CM | POA: Insufficient documentation

## 2016-02-21 DIAGNOSIS — K469 Unspecified abdominal hernia without obstruction or gangrene: Secondary | ICD-10-CM | POA: Insufficient documentation

## 2016-02-21 DIAGNOSIS — M1711 Unilateral primary osteoarthritis, right knee: Secondary | ICD-10-CM

## 2016-02-21 DIAGNOSIS — H60393 Other infective otitis externa, bilateral: Secondary | ICD-10-CM

## 2016-02-21 DIAGNOSIS — K4190 Unilateral femoral hernia, without obstruction or gangrene, not specified as recurrent: Secondary | ICD-10-CM | POA: Diagnosis not present

## 2016-02-21 DIAGNOSIS — F0391 Unspecified dementia with behavioral disturbance: Secondary | ICD-10-CM

## 2016-02-21 DIAGNOSIS — H6120 Impacted cerumen, unspecified ear: Secondary | ICD-10-CM | POA: Insufficient documentation

## 2016-02-21 DIAGNOSIS — R440 Auditory hallucinations: Secondary | ICD-10-CM

## 2016-02-21 DIAGNOSIS — G6289 Other specified polyneuropathies: Secondary | ICD-10-CM | POA: Diagnosis not present

## 2016-02-21 MED ORDER — NEOMYCIN-POLYMYXIN-HC 3.5-10000-1 OT SOLN: 4.0000 [drp] | Freq: Four times a day (QID) | OTIC | Status: DC

## 2016-02-21 NOTE — Progress Notes (Signed)
Patient ID: Jane [name], female   DOB: 1925-03-07, 82 y.o.   MRN: [id number]   Location:  [facility] clinic Provider:  Anijah Spohr L. Renato Gails, D.O., C.M.D.  Code Status: DNR Goals of Care:  Advanced Directives 02/21/2016  Does patient have an advance directive? Yes  Type of Estate agent of [place];Out of facility DNR (pink MOST or yellow form)  Does patient want to make changes to advanced directive? -  Copy of advanced directive(s) in chart? Yes  Pre-existing out of facility DNR order (yellow form or pink MOST form) Pink MOST form placed in chart (order not valid for inpatient use)   Chief Complaint  Patient presents with  . Medical Management of Chronic Issues    3 mth follow-up    HPI: Patient is a 82 y.o. female seen today for medical management of chronic diseases.      Right knee pain:  New onset.  Has been getting more therapy and it hurts when she stands on the medial aspect of her right knee.  Her left leg chronic knee and neuropathic pain also continue.    Lyrica helped leg pain a lot, but that's when the hallucinations and voices began so it was discontinued.  She was hearing voices coming through her phone in her room and thought her phone was hacked.  Apparently, she got better when the phone was removed and seroquel was increased.  Her daughter does not want a retrial of lyrica due to the severity of the hallucinations.    Her hernia on the right lower quadrant has become more painful and larger over time.  She has been moving her bowels regularly with miralax every other day.  Could be that this hernia developed due to scar tissue from prior surgeries.    Her weight is stable.    Says she shakes all over since a nurse gave her the wrong drop in her eye (acetic acid eardrop instead of eyedrop).      Past Medical History  Diagnosis Date  . Hypertension   . Impacted cerumen   . Abnormality of gait   . Anal fissure   . Loss of weight   . Hyposmolality  and/or hyponatremia   . Pain in limb   . Other vitamin B12 deficiency anemia   . Chest pain, unspecified   . Anemia, unspecified   . Anxiety state, unspecified   . Abdominal pain, right upper quadrant   . Other pulmonary embolism and infarction   . Intestinal or peritoneal adhesions with obstruction (postoperative) (postinfection) (HCC)   . Long term (current) use of anticoagulants   . Abdominal pain, right lower quadrant   . Essential and other specified forms of tremor   . Lumbago   . Unspecified hypothyroidism   . Major depressive disorder, single episode, unspecified (HCC)   . Other and unspecified hyperlipidemia   . Macular degeneration (senile) of retina, unspecified   . Unspecified glaucoma   . Unspecified cataract   . Unspecified hearing loss   . Unspecified essential hypertension   . Osteoarthrosis, unspecified whether generalized or localized, unspecified site   . Osteoporosis, senile   . Abnormal involuntary movements(781.0)   . Emphysema of lung (HCC)   . CAP (community acquired pneumonia) 01/31/2014  . Postoperative anemia due to acute blood loss 01/31/2014  . Laceration of left ear canal 04/13/2014  . Closed left hip fracture (HCC) 06/25/2014  . Gastritis 02/13/2013  . Hyperlipidemia   . Fracture of orbital floor, left,  closed 06/18/2015  . Humerus fracture   . Closed fracture of maxillary sinus, left 06/18/2015  . Vitamin B 12 deficiency 06/19/2015    Past Surgical History  Procedure Laterality Date  . Excisional hemorrhoidectomy  1980  . Bladder suspension  1993  . Back surgery  2003  . Abdominal hysterectomy    . Trigger finger release  2009    Dr Teressa Senter  . Intramedullary (im) nail intertrochanteric Right 01/27/2014    Procedure: INTRAMEDULLARY (IM) NAIL INTERTROCHANTRIC;  Surgeon: Sheral Apley, MD;  Location: [facility] OR;  Service: Orthopedics;  Laterality: Right;  . Intramedullary (im) nail intertrochanteric Left 06/26/2014    Procedure: LEFT HIP INTRAMEDULLARY  (IM) NAIL;  Surgeon: Cheral Almas, MD;  Location: [facility] OR;  Service: Orthopedics;  Laterality: Left;    Allergies  Allergen Reactions  . Celebrex [Celecoxib]     unknown  . Chlorzoxazone     unknown  . Fentanyl Anxiety      Medication List       This list is accurate as of: 02/21/16  2:33 PM.  Always use your most recent med list.               acetaminophen 325 MG tablet  Commonly known as:  TYLENOL  Take 1 tablet (325 mg total) by mouth 3 (three) times daily.     acetic acid 2 % otic solution  Commonly known as:  VOSOL  Place 4 drops into both ears 3 (three) times daily.     albuterol 108 (90 Base) MCG/ACT inhaler  Commonly known as:  PROVENTIL HFA;VENTOLIN HFA  Inhale 2 puffs into the lungs every 6 (six) hours as needed for wheezing or shortness of breath (Please dispense generic).     amLODipine 5 MG tablet  Commonly known as:  NORVASC  Take 1 tablet (5 mg total) by mouth daily.     calcium carbonate 500 MG chewable tablet  Commonly known as:  TUMS - dosed in mg elemental calcium  Chew 1 tablet by mouth daily as needed for indigestion or heartburn.     calcium-vitamin D 500-200 MG-UNIT tablet  Commonly known as:  OSCAL WITH D  Take 1 tablet by mouth daily.     cyanocobalamin 1000 MCG/ML injection  Commonly known as:  (VITAMIN B-12)  Inject 1,000 mcg into the muscle. One injection every 30 days for 3 months.     fexofenadine 180 MG tablet  Commonly known as:  ALLEGRA  Take 180 mg by mouth daily as needed for allergies or rhinitis.     HYDROcodone-acetaminophen 5-325 MG tablet  Commonly known as:  NORCO/VICODIN  Take one tablet by mouth twice daily for pain     latanoprost 0.005 % ophthalmic solution  Commonly known as:  XALATAN  Place 1 drop into both eyes at bedtime.     levothyroxine 88 MCG tablet  Commonly known as:  SYNTHROID, LEVOTHROID  Take one tablet by mouth once daily 30 minutes before breakfast for thyroid.     LORazepam 0.5 MG tablet   Commonly known as:  ATIVAN  Take 1 tablet (0.5 mg total) by mouth at bedtime.     lovastatin 20 MG tablet  Commonly known as:  MEVACOR  Take 1 tablet (20 mg total) by mouth every evening.     polyethylene glycol powder powder  Commonly known as:  GLYCOLAX/MIRALAX  Take 1 capful by mouth as needed     QUEtiapine 50 MG tablet  Commonly known as:  SEROQUEL  Take one tablet by mouth every morning and one at bedtime     sertraline 100 MG tablet  Commonly known as:  ZOLOFT  Take one tablet by mouth once daily     Vitamin D 2000 units tablet  Take one tablet daily by mouth for Vitamin D        Review of Systems:  Review of Systems  Constitutional: Negative for fever, chills and weight loss.       Weight improving  HENT: Positive for ear pain and hearing loss.        Ear pain and fullness, itching bilaterally  Eyes: Positive for blurred vision.  Respiratory: Negative for shortness of breath.   Cardiovascular: Negative for chest pain, palpitations and leg swelling.  Gastrointestinal: Negative for abdominal pain.  Genitourinary: Negative for dysuria.  Musculoskeletal: Positive for joint pain. Negative for falls.  Neurological: Positive for weakness. Negative for dizziness and loss of consciousness.  Psychiatric/Behavioral: Positive for depression, hallucinations and memory loss. The patient is nervous/anxious.     Health Maintenance  Topic Date Due  . ZOSTAVAX  08/08/1987  . INFLUENZA VACCINE  06/06/2016  . TETANUS/TDAP  12/17/2021  . DEXA SCAN  Completed  . PNA vac Low Risk Adult  Completed    Physical Exam: Filed Vitals:   02/21/16 1417  BP: 120/62  Pulse: 84  Temp: 98.1 F (36.7 C)  TempSrc: Oral  Height: 5\' 1"  (1.549 m)  Weight: 119 lb (53.978 kg)  SpO2: 94%   Body mass index is 22.5 kg/(m^2). Physical Exam  Constitutional: She appears well-developed and well-nourished. No distress.  HENT:  Bilateral ears with white, flaky material and cerumen in impacted   Cardiovascular: Normal rate, regular rhythm, normal heart sounds and intact distal pulses.   Pulmonary/Chest: Effort normal and breath sounds normal. No respiratory distress.  Abdominal: Soft. Bowel sounds are normal. She exhibits no distension. There is no tenderness. There is no rebound and no guarding. A hernia is present.  RLQ femoral hernia  Musculoskeletal: Normal range of motion.  Neurological: She is alert.  Oriented to person and place  Skin: Skin is warm and dry.  Psychiatric: She has a normal mood and affect.    Labs reviewed: Basic Metabolic Panel:  Recent Labs  0145  05/24/15 1611  06/16/15 1510  06/18/15 0956 06/19/15 0751 08/23/15 1632 11/26/15 01/04/16  NA 138  --  142  < >  --   < > 139 141 143 139 142  K 3.9  --  4.9  < >  --   < > 3.2* 3.9 4.0 4.6 4.2  CL 107  --  102  < >  --   < > 106 110 101  --   --   CO2 27  --  27  < >  --   < > 28 24 25   --   --   GLUCOSE 92  --  100*  < >  --   < > 105* 92 86  --   --   BUN 19  --  23  < >  --   < > 17 15 24 19 21   CREATININE 0.69  --  0.76  < > 0.83  < > 0.65 0.62 0.76 0.7 0.7  CALCIUM 9.0  --  9.7  < >  --   < > 8.8* 9.0 9.9  --   --   MG 2.0  --   --   --   --   --   --   --   --   --   --  PHOS 3.7  --   --   --   --   --   --   --   --   --   --   TSH 19.354*  < > 0.849  --  0.329*  --   --   --  0.601  --   --   < > = values in this interval not displayed. Liver Function Tests:  Recent Labs  02/26/15 2229 02/27/15 0145 06/18/15 0956  AST 17 17 23   ALT 13 13 12*  ALKPHOS 70 62 53  BILITOT 0.5 0.6 0.6  PROT 6.3 6.1 6.0*  ALBUMIN 4.4 4.1 3.6   No results for input(s): LIPASE, AMYLASE in the last 8760 hours. No results for input(s): AMMONIA in the last 8760 hours. CBC:  Recent Labs  02/22/15 1420  06/15/15 2348  06/18/15 0956 06/19/15 0751 08/23/15 1632 11/26/15 01/04/16  WBC 6.5  < > 12.3*  < > 7.1 6.0 4.9 4.9 4.9  NEUTROABS 4.9  --  10.6*  --   --   --  2.9  --   --   HGB 12.0  <  > 11.2*  < > 9.1* 8.7*  --  11.5* 11.8*  HCT 35.8  < > 34.4*  < > 27.5* 25.8* 37.9 35* 35*  MCV 92  < > 93.0  < > 93.9 93.5 92  --   --   PLT 207  < > 155  < > 166 167 203 191 177  < > = values in this interval not displayed. Lipid Panel:  Recent Labs  04/08/15 0932  CHOL 148  HDL 50  LDLCALC 80  TRIG 90  CHOLHDL 3.0   Lab Results  Component Value Date   HGBA1C 5.5 02/27/2015    Assessment/Plan 1. Cerumen impaction, bilateral -ears flushed with peroxide and warm water -d/c acetic acid drops due to anxiety associated and the lack of benefit  2. Dementia, with behavioral disturbance -is pretty stable, will reassess at annual for mmse  3. Severe auditory hallucinations -improved with stopping lyrica and   4. Other polyneuropathy (HCC) -lyrica was very helpful but it was associated with the onset of the auditory hallucinations so her daughter does not want it restarted--added to intolerances -cont hydrocodone  5. Primary osteoarthritis of right knee -continue therapy for this and left hip/leg pain -also switch from ice to heat since ice is not giving her relief -also use topicals she keeps in her room Jane [name] will remind her)  6. Unilateral femoral hernia without obstruction or gangrene, recurrence not specified -seems stable, bowels moving with miralax  7.  Otitis externa bilaterally -start on new ear drops with cortisporin otic 4 drops 4x daily until 89ml bottle used up  -facility or daughter to notify me if she is not better at that point  Labs/tests ordered:  No new (had labs between visits for the hallucinations) Next appt:  04/04/2016   Reviewed again with her daughter that the medications she is on including seroquel, ativan and hydrocodone are not typically recommended in her age group due to side effects; however, she seems to need these for hallucinations, anxiety and pain respectively as conservative approaches to these are not effective.  Jane [name] L. Rumi Taras,  D.O. Geriatrics [facility] [address], [place] [address] Cell Phone (Mon-Fri 8am-5pm):  [phone] On Call:  [phone] & follow prompts after 5pm & weekends Office Phone:  [phone] Office Fax:  [phone]

## 2016-02-22 DIAGNOSIS — M6281 Muscle weakness (generalized): Secondary | ICD-10-CM | POA: Diagnosis not present

## 2016-02-22 DIAGNOSIS — S42201A Unspecified fracture of upper end of right humerus, initial encounter for closed fracture: Secondary | ICD-10-CM | POA: Diagnosis not present

## 2016-02-22 DIAGNOSIS — R279 Unspecified lack of coordination: Secondary | ICD-10-CM | POA: Diagnosis not present

## 2016-02-22 DIAGNOSIS — R262 Difficulty in walking, not elsewhere classified: Secondary | ICD-10-CM | POA: Diagnosis not present

## 2016-02-22 DIAGNOSIS — R296 Repeated falls: Secondary | ICD-10-CM | POA: Diagnosis not present

## 2016-02-22 DIAGNOSIS — F0390 Unspecified dementia without behavioral disturbance: Secondary | ICD-10-CM | POA: Diagnosis not present

## 2016-02-24 DIAGNOSIS — F0390 Unspecified dementia without behavioral disturbance: Secondary | ICD-10-CM | POA: Diagnosis not present

## 2016-02-24 DIAGNOSIS — M6281 Muscle weakness (generalized): Secondary | ICD-10-CM | POA: Diagnosis not present

## 2016-02-24 DIAGNOSIS — S42201A Unspecified fracture of upper end of right humerus, initial encounter for closed fracture: Secondary | ICD-10-CM | POA: Diagnosis not present

## 2016-02-24 DIAGNOSIS — R296 Repeated falls: Secondary | ICD-10-CM | POA: Diagnosis not present

## 2016-02-24 DIAGNOSIS — R279 Unspecified lack of coordination: Secondary | ICD-10-CM | POA: Diagnosis not present

## 2016-02-24 DIAGNOSIS — R262 Difficulty in walking, not elsewhere classified: Secondary | ICD-10-CM | POA: Diagnosis not present

## 2016-02-29 DIAGNOSIS — R296 Repeated falls: Secondary | ICD-10-CM | POA: Diagnosis not present

## 2016-02-29 DIAGNOSIS — M6281 Muscle weakness (generalized): Secondary | ICD-10-CM | POA: Diagnosis not present

## 2016-02-29 DIAGNOSIS — S42201A Unspecified fracture of upper end of right humerus, initial encounter for closed fracture: Secondary | ICD-10-CM | POA: Diagnosis not present

## 2016-02-29 DIAGNOSIS — R262 Difficulty in walking, not elsewhere classified: Secondary | ICD-10-CM | POA: Diagnosis not present

## 2016-02-29 DIAGNOSIS — R279 Unspecified lack of coordination: Secondary | ICD-10-CM | POA: Diagnosis not present

## 2016-02-29 DIAGNOSIS — F0390 Unspecified dementia without behavioral disturbance: Secondary | ICD-10-CM | POA: Diagnosis not present

## 2016-03-03 ENCOUNTER — Other Ambulatory Visit: Payer: Self-pay

## 2016-03-03 ENCOUNTER — Telehealth: Payer: Self-pay

## 2016-03-03 MED ORDER — HYDROCODONE-ACETAMINOPHEN 5-325 MG PO TABS: ORAL_TABLET | ORAL | Status: DC

## 2016-03-03 NOTE — Telephone Encounter (Signed)
Message left on triage voicemail: The facility sent a refill request to patient's mail order company in error, rx was shipped and sent back. The patient has 2 pills left and will run out soon, patient tried to fill at local pharmacy and they can not fill per the insurance company because rx was released by [employer]. Patient's daughter does not know what to do because this is a medication her mother can not be without this medication    I called [facility] to see how we can help this patient get a refill on her medication. When I called [facility] they already had the situation figured out. I called [name] and she was delighted that this was handled and that her mother would not be without medication.

## 2016-03-07 ENCOUNTER — Emergency Department (HOSPITAL_COMMUNITY): Payer: Medicare Other

## 2016-03-07 ENCOUNTER — Emergency Department (HOSPITAL_COMMUNITY)
Admission: EM | Admit: 2016-03-07 | Discharge: 2016-03-07 | Disposition: A | Discharge status: Home / Self Care | Payer: Medicare Other | Attending: Emergency Medicine | Admitting: Emergency Medicine

## 2016-03-07 ENCOUNTER — Encounter (HOSPITAL_COMMUNITY): Payer: Self-pay

## 2016-03-07 DIAGNOSIS — E039 Hypothyroidism, unspecified: Secondary | ICD-10-CM | POA: Insufficient documentation

## 2016-03-07 DIAGNOSIS — M810 Age-related osteoporosis without current pathological fracture: Secondary | ICD-10-CM | POA: Diagnosis not present

## 2016-03-07 DIAGNOSIS — Z79899 Other long term (current) drug therapy: Secondary | ICD-10-CM | POA: Insufficient documentation

## 2016-03-07 DIAGNOSIS — F411 Generalized anxiety disorder: Secondary | ICD-10-CM | POA: Diagnosis not present

## 2016-03-07 DIAGNOSIS — M79622 Pain in left upper arm: Secondary | ICD-10-CM | POA: Diagnosis not present

## 2016-03-07 DIAGNOSIS — H269 Unspecified cataract: Secondary | ICD-10-CM | POA: Insufficient documentation

## 2016-03-07 DIAGNOSIS — S4992XA Unspecified injury of left shoulder and upper arm, initial encounter: Secondary | ICD-10-CM | POA: Diagnosis not present

## 2016-03-07 DIAGNOSIS — H9190 Unspecified hearing loss, unspecified ear: Secondary | ICD-10-CM | POA: Diagnosis not present

## 2016-03-07 DIAGNOSIS — W06XXXA Fall from bed, initial encounter: Secondary | ICD-10-CM | POA: Diagnosis not present

## 2016-03-07 DIAGNOSIS — F329 Major depressive disorder, single episode, unspecified: Secondary | ICD-10-CM | POA: Insufficient documentation

## 2016-03-07 DIAGNOSIS — Z862 Personal history of diseases of the blood and blood-forming organs and certain disorders involving the immune mechanism: Secondary | ICD-10-CM | POA: Diagnosis not present

## 2016-03-07 DIAGNOSIS — M7989 Other specified soft tissue disorders: Secondary | ICD-10-CM | POA: Diagnosis not present

## 2016-03-07 DIAGNOSIS — S79912A Unspecified injury of left hip, initial encounter: Secondary | ICD-10-CM | POA: Diagnosis not present

## 2016-03-07 DIAGNOSIS — Z8701 Personal history of pneumonia (recurrent): Secondary | ICD-10-CM | POA: Diagnosis not present

## 2016-03-07 DIAGNOSIS — Z8719 Personal history of other diseases of the digestive system: Secondary | ICD-10-CM | POA: Insufficient documentation

## 2016-03-07 DIAGNOSIS — E785 Hyperlipidemia, unspecified: Secondary | ICD-10-CM | POA: Insufficient documentation

## 2016-03-07 DIAGNOSIS — Y9389 Activity, other specified: Secondary | ICD-10-CM | POA: Diagnosis not present

## 2016-03-07 DIAGNOSIS — Z87891 Personal history of nicotine dependence: Secondary | ICD-10-CM | POA: Insufficient documentation

## 2016-03-07 DIAGNOSIS — M25552 Pain in left hip: Secondary | ICD-10-CM | POA: Diagnosis not present

## 2016-03-07 DIAGNOSIS — Z86711 Personal history of pulmonary embolism: Secondary | ICD-10-CM | POA: Insufficient documentation

## 2016-03-07 DIAGNOSIS — I10 Essential (primary) hypertension: Secondary | ICD-10-CM | POA: Diagnosis not present

## 2016-03-07 DIAGNOSIS — S8992XA Unspecified injury of left lower leg, initial encounter: Secondary | ICD-10-CM | POA: Diagnosis not present

## 2016-03-07 DIAGNOSIS — S199XXA Unspecified injury of neck, initial encounter: Secondary | ICD-10-CM | POA: Diagnosis not present

## 2016-03-07 DIAGNOSIS — Y998 Other external cause status: Secondary | ICD-10-CM | POA: Diagnosis not present

## 2016-03-07 DIAGNOSIS — W19XXXA Unspecified fall, initial encounter: Secondary | ICD-10-CM

## 2016-03-07 DIAGNOSIS — S0990XA Unspecified injury of head, initial encounter: Secondary | ICD-10-CM | POA: Diagnosis not present

## 2016-03-07 DIAGNOSIS — S064X1A Epidural hemorrhage with loss of consciousness of 30 minutes or less, initial encounter: Secondary | ICD-10-CM | POA: Diagnosis not present

## 2016-03-07 DIAGNOSIS — R259 Unspecified abnormal involuntary movements: Secondary | ICD-10-CM | POA: Diagnosis not present

## 2016-03-07 DIAGNOSIS — M79602 Pain in left arm: Secondary | ICD-10-CM | POA: Diagnosis not present

## 2016-03-07 DIAGNOSIS — M542 Cervicalgia: Secondary | ICD-10-CM | POA: Diagnosis not present

## 2016-03-07 DIAGNOSIS — J439 Emphysema, unspecified: Secondary | ICD-10-CM | POA: Insufficient documentation

## 2016-03-07 DIAGNOSIS — R51 Headache: Secondary | ICD-10-CM | POA: Diagnosis not present

## 2016-03-07 DIAGNOSIS — Y9289 Other specified places as the place of occurrence of the external cause: Secondary | ICD-10-CM | POA: Insufficient documentation

## 2016-03-07 DIAGNOSIS — M1990 Unspecified osteoarthritis, unspecified site: Secondary | ICD-10-CM | POA: Diagnosis not present

## 2016-03-07 NOTE — ED Provider Notes (Signed)
CSN: [id number]     Arrival date & time 03/07/16  0545 History   First MD Initiated Contact with Patient 03/07/16 [phone]     Chief Complaint  Patient presents with  . Fall     (Consider location/radiation/quality/duration/timing/severity/associated sxs/prior Treatment) HPI Patient presents to the emergency department with injuries from a fall that occurred just prior to arrival.  The patient states she fell out of bed, landing on her left side.  She is complaining of pain in the left upper arm, left hip and the back of her head.  The patient states she has a goose egg to the back of her head.  She states she does not think she lost consciousness.  Patient states she has had this happen multiple times in the past. The patient denies chest pain, shortness of breath, headache,blurred vision, neck pain, fever, cough, weakness, numbness, dizziness, anorexia, edema, abdominal pain, nausea, vomiting, diarrhea, rash, back pain, dysuria, hematemesis, bloody stool, near syncope, or syncope. Past Medical History  Diagnosis Date  . Hypertension   . Impacted cerumen   . Abnormality of gait   . Anal fissure   . Loss of weight   . Hyposmolality and/or hyponatremia   . Pain in limb   . Other vitamin B12 deficiency anemia   . Chest pain, unspecified   . Anemia, unspecified   . Anxiety state, unspecified   . Abdominal pain, right upper quadrant   . Other pulmonary embolism and infarction   . Intestinal or peritoneal adhesions with obstruction (postoperative) (postinfection) (HCC)   . Long term (current) use of anticoagulants   . Abdominal pain, right lower quadrant   . Essential and other specified forms of tremor   . Lumbago   . Unspecified hypothyroidism   . Major depressive disorder, single episode, unspecified (HCC)   . Other and unspecified hyperlipidemia   . Macular degeneration (senile) of retina, unspecified   . Unspecified glaucoma   . Unspecified cataract   . Unspecified hearing loss   .  Unspecified essential hypertension   . Osteoarthrosis, unspecified whether generalized or localized, unspecified site   . Osteoporosis, senile   . Abnormal involuntary movements(781.0)   . Emphysema of lung (HCC)   . CAP (community acquired pneumonia) 01/31/2014  . Postoperative anemia due to acute blood loss 01/31/2014  . Laceration of left ear canal 04/13/2014  . Closed left hip fracture (HCC) 06/25/2014  . Gastritis 02/13/2013  . Hyperlipidemia   . Fracture of orbital floor, left, closed 06/18/2015  . Humerus fracture   . Closed fracture of maxillary sinus, left 06/18/2015  . Vitamin B 12 deficiency 06/19/2015   Past Surgical History  Procedure Laterality Date  . Excisional hemorrhoidectomy  1980  . Bladder suspension  1993  . Back surgery  2003  . Abdominal hysterectomy    . Trigger finger release  2009    Dr Teressa Senter  . Intramedullary (im) nail intertrochanteric Right 01/27/2014    Procedure: INTRAMEDULLARY (IM) NAIL INTERTROCHANTRIC;  Surgeon: Sheral Apley, MD;  Location: [facility] OR;  Service: Orthopedics;  Laterality: Right;  . Intramedullary (im) nail intertrochanteric Left 06/26/2014    Procedure: LEFT HIP INTRAMEDULLARY (IM) NAIL;  Surgeon: Cheral Almas, MD;  Location: [facility] OR;  Service: Orthopedics;  Laterality: Left;   Family History  Problem Relation Age of Onset  . Congestive Heart Failure Mother   . Macular degeneration Mother   . Heart disease Mother   . Cancer Sister     lung cancer  .  Arthritis Daughter   . Heart disease Daughter   . Glaucoma Sister   . Cataracts Sister    Social History  Substance Use Topics  . Smoking status: Former Smoker -- 40 years    Types: Cigarettes  . Smokeless tobacco: Never Used  . Alcohol Use: No   OB History    No data available     Review of Systems All other systems negative except as documented in the HPI. All pertinent positives and negatives as reviewed in the HPI.  Allergies  Lyrica; Celebrex; Chlorzoxazone; and  Fentanyl  Home Medications   Prior to Admission medications   Medication Sig Start Date End Date Taking? Authorizing Provider  acetaminophen (TYLENOL) 325 MG tablet Take 1 tablet (325 mg total) by mouth 3 (three) times daily. 02/07/16  Yes Tiffany L Reed, DO  acetaminophen (TYLENOL) 325 MG tablet Take 650 mg by mouth every 6 (six) hours as needed for mild pain.   Yes Historical Provider, MD  albuterol (PROVENTIL HFA;VENTOLIN HFA) 108 (90 BASE) MCG/ACT inhaler Inhale 2 puffs into the lungs every 6 (six) hours as needed for wheezing or shortness of breath (Please dispense generic). 09/01/15  Yes Tiffany L Reed, DO  amLODipine (NORVASC) 5 MG tablet Take 1 tablet (5 mg total) by mouth daily. 11/25/15  Yes Tiffany L Reed, DO  calcium carbonate (TUMS EX) 750 MG chewable tablet Chew 1 tablet by mouth daily.   Yes Historical Provider, MD  calcium-vitamin D (OSCAL WITH D) 500-200 MG-UNIT per tablet Take 1 tablet by mouth daily.   Yes Historical Provider, MD  Cholecalciferol (VITAMIN D) 2000 units tablet Take one tablet daily by mouth for Vitamin D 11/25/15  Yes Tiffany L Reed, DO  fexofenadine (ALLEGRA) 180 MG tablet Take 180 mg by mouth daily.    Yes Historical Provider, MD  HYDROcodone-acetaminophen (NORCO/VICODIN) 5-325 MG tablet Take one tablet by mouth twice daily for pain 03/03/16  Yes Tiffany L Reed, DO  latanoprost (XALATAN) 0.005 % ophthalmic solution Place 1 drop into both eyes at bedtime. 11/25/15  Yes Tiffany L Reed, DO  levothyroxine (SYNTHROID, LEVOTHROID) 88 MCG tablet Take one tablet by mouth once daily 30 minutes before breakfast for thyroid. 11/30/15  Yes Tiffany L Reed, DO  LORazepam (ATIVAN) 0.5 MG tablet Take 1 tablet (0.5 mg total) by mouth at bedtime. 11/25/15  Yes Tiffany L Reed, DO  lovastatin (MEVACOR) 20 MG tablet Take 1 tablet (20 mg total) by mouth every evening. 11/25/15  Yes Tiffany L Reed, DO  polyethylene glycol (MIRALAX / GLYCOLAX) packet Take 17 g by mouth daily.   Yes Historical  Provider, MD  QUEtiapine (SEROQUEL) 50 MG tablet Take one tablet by mouth every morning and one at bedtime 02/02/16  Yes Tiffany L Reed, DO  sertraline (ZOLOFT) 100 MG tablet Take one tablet by mouth once daily 11/25/15  Yes Tiffany L Reed, DO   BP 195/75 mmHg  Pulse 79  Temp(Src) 98.4 F (36.9 C) (Oral)  Ht 5\' 1"  (1.549 m)  Wt 52.164 kg  BMI 21.74 kg/m2  SpO2 95% Physical Exam  Constitutional: She is oriented to person, place, and time. She appears well-developed and well-nourished. No distress.  HENT:  Head: Normocephalic and atraumatic.  Mouth/Throat: Oropharynx is clear and moist.  Eyes: Pupils are equal, round, and reactive to light.  Neck: Normal range of motion. Neck supple.  Cardiovascular: Normal rate, regular rhythm and normal heart sounds.  Exam reveals no gallop and no friction rub.   No  murmur heard. Pulmonary/Chest: Effort normal and breath sounds normal. No respiratory distress. She has no wheezes.  Abdominal: Soft. Bowel sounds are normal. She exhibits no distension. There is no tenderness.  Musculoskeletal:       Left hip: She exhibits tenderness. She exhibits normal range of motion, normal strength, no bony tenderness, no swelling, no crepitus and no deformity.       Left knee: She exhibits normal range of motion, no swelling, no deformity and no bony tenderness. Tenderness found.       Left upper arm: She exhibits tenderness. She exhibits no bony tenderness, no swelling, no edema and no deformity.  Neurological: She is alert and oriented to person, place, and time. She exhibits normal muscle tone. Coordination normal.  Skin: Skin is warm and dry. No rash noted. No erythema.  Psychiatric: She has a normal mood and affect. Her behavior is normal.  Nursing note and vitals reviewed.   ED Course  Procedures (including critical care time) Labs Review Labs Reviewed - No data to display  Imaging Review Ct Head Wo Contrast  03/07/2016  CLINICAL DATA:  Pain following  fall EXAM: CT HEAD WITHOUT CONTRAST CT CERVICAL SPINE WITHOUT CONTRAST TECHNIQUE: Multidetector CT imaging of the head and cervical spine was performed following the standard protocol without intravenous contrast. Multiplanar CT image reconstructions of the cervical spine were also generated. COMPARISON:  CT head and CT cervical spine June 15, 2015 FINDINGS: CT HEAD FINDINGS Mild diffuse atrophy is stable. There is no intracranial mass, hemorrhage, extra-axial fluid collection, or midline shift. There is extensive small vessel disease throughout the centra semiovale bilaterally. There is evidence of a prior infarct in the medial right temporal lobe, stable. No new gray-white compartment lesions are identified. There is no acute infarct evident. There is a small amount of basal ganglia calcification on the left, stable and likely physiologic. The bony calvarium appears intact. The mastoid air cells are clear. There is evidence of an old fracture of the anterior left maxillary antrum with healing. No intraorbital lesions are evident beyond evidence of cataracts bilaterally. CT CERVICAL SPINE FINDINGS There is no apparent fracture. There is slight anterolisthesis of C7 on T1, stable and likely due to underlying spondylosis. There is no new spondylolisthesis. Prevertebral soft tissues and predental space regions are normal. There is moderately severe disc space narrowing at all levels except for C2-3. There is mild disc space narrowing at C2-3. There is facet hypertrophy at multiple levels bilaterally. There is moderate exit foraminal narrowing at C4-5 on the left, at C5-6 bilaterally, and C6-7 on the right due to bony hypertrophy. There is carotid artery calcification bilaterally. There is stable scarring in the lung apices. IMPRESSION: CT head: Stable atrophy with extensive periventricular small vessel disease. Prior infarct medial right temporal lobe. No acute infarct evident. No intracranial mass, hemorrhage, or  extra-axial fluid collection. Old fracture anterior left maxillary antrum, healed. CT cervical spine: No fracture. Slight spondylolisthesis at C7-T1 is stable and felt to be due to underlying spondylosis. No new spondylolisthesis. Multilevel arthropathy. Bilateral carotid artery calcification present. Electronically Signed   By: Bretta Bang III M.D.   On: 03/07/2016 07:32   Ct Cervical Spine Wo Contrast  03/07/2016  CLINICAL DATA:  Pain following fall EXAM: CT HEAD WITHOUT CONTRAST CT CERVICAL SPINE WITHOUT CONTRAST TECHNIQUE: Multidetector CT imaging of the head and cervical spine was performed following the standard protocol without intravenous contrast. Multiplanar CT image reconstructions of the cervical spine were also generated. COMPARISON:  CT head and CT cervical spine June 15, 2015 FINDINGS: CT HEAD FINDINGS Mild diffuse atrophy is stable. There is no intracranial mass, hemorrhage, extra-axial fluid collection, or midline shift. There is extensive small vessel disease throughout the centra semiovale bilaterally. There is evidence of a prior infarct in the medial right temporal lobe, stable. No new gray-white compartment lesions are identified. There is no acute infarct evident. There is a small amount of basal ganglia calcification on the left, stable and likely physiologic. The bony calvarium appears intact. The mastoid air cells are clear. There is evidence of an old fracture of the anterior left maxillary antrum with healing. No intraorbital lesions are evident beyond evidence of cataracts bilaterally. CT CERVICAL SPINE FINDINGS There is no apparent fracture. There is slight anterolisthesis of C7 on T1, stable and likely due to underlying spondylosis. There is no new spondylolisthesis. Prevertebral soft tissues and predental space regions are normal. There is moderately severe disc space narrowing at all levels except for C2-3. There is mild disc space narrowing at C2-3. There is facet  hypertrophy at multiple levels bilaterally. There is moderate exit foraminal narrowing at C4-5 on the left, at C5-6 bilaterally, and C6-7 on the right due to bony hypertrophy. There is carotid artery calcification bilaterally. There is stable scarring in the lung apices. IMPRESSION: CT head: Stable atrophy with extensive periventricular small vessel disease. Prior infarct medial right temporal lobe. No acute infarct evident. No intracranial mass, hemorrhage, or extra-axial fluid collection. Old fracture anterior left maxillary antrum, healed. CT cervical spine: No fracture. Slight spondylolisthesis at C7-T1 is stable and felt to be due to underlying spondylosis. No new spondylolisthesis. Multilevel arthropathy. Bilateral carotid artery calcification present. Electronically Signed   By: Bretta Bang III M.D.   On: 03/07/2016 07:32   Dg Knee Complete 4 Views Left  03/07/2016  CLINICAL DATA:  82 year old female who fell out of bed. Left side pain. Initial encounter. EXAM: LEFT KNEE - COMPLETE 4+ VIEW COMPARISON:  Left femur series 8 2015. FINDINGS: The distal aspect of a left femur intra medullary rod is visible and appears intact. Osteopenia. Superimposed calcified peripheral vascular disease. Mild for age degenerative changes at the left knee. Patella appears intact with overlying soft tissue swelling. No joint effusion or acute fracture identified. IMPRESSION: Soft tissue swelling anterior to the patella. No acute fracture or dislocation identified about the left knee. Electronically Signed   By: Odessa Fleming M.D.   On: 03/07/2016 07:41   Dg Humerus Left  03/07/2016  CLINICAL DATA:  82 year old female who fell out of bed. Left side pain. Initial encounter. EXAM: LEFT HUMERUS - 2+ VIEW COMPARISON:  Left humerus series 8916. FINDINGS: Chronic spiral deformity of the midshaft left humerus with interval healing. Suggestion of solid osseous union although displacement at the fracture site persists. Superimposed  alignment at the left shoulder and elbow appears grossly preserved. No acute osseous abnormality identified. IMPRESSION: Chronic left humerus deformity. No superimposed acute osseous abnormality identified. Electronically Signed   By: Odessa Fleming M.D.   On: 03/07/2016 07:40   Dg Hip Unilat With Pelvis 2-3 Views Left  03/07/2016  CLINICAL DATA:  82 year old female who fell out of bed. Left side pain. Initial encounter. EXAM: DG HIP (WITH OR WITHOUT PELVIS) 2-3V LEFT COMPARISON:  Left hip series 714 2016. FINDINGS: Sequelae of bilateral proximal femoral ORIF with intra medullary rods and interlocking dynamic hip screws. Underlying osteopenia. Chronic dystrophic calcification about the left hip. Pelvis appears stable and intact. Grossly intact  proximal right femur. Stable visualized proximal right femoral hardware. Visualized left femur and left femoral hardware appear stable and intact. Superimposed calcified aortic, iliac, and femoral artery atherosclerosis. IMPRESSION: No acute fracture or dislocation identified about the left hip or pelvis. Previous bilateral proximal femur ORIF re- demonstrated. Electronically Signed   By: Odessa Fleming M.D.   On: 03/07/2016 07:44   I have personally reviewed and evaluated these images and lab results as part of my medical decision-making.  Patient will be advised to return here as needed.  She is advised of the results and all questions were answered.  Patient will be discharged back to the nursing facility told to return here for any worsening in her condition  MDM   Final diagnoses:  None        Charlestine Night, PA-C 03/07/16 1610  Benjiman Core, MD 03/07/16 1536

## 2016-03-07 NOTE — Discharge Instructions (Signed)
Return here as needed.  Follow-up with your primary care doctor.  Your x-rays here today were normal

## 2016-03-07 NOTE — ED Notes (Signed)
Per [facility]: Pt is from [facility] - pt fell out of bed and hit the back of her head on the floor, fall was unwitnessed. Pt cried out as soon as it happened and staff responded. Pt denies any loss of consciousness, denies hip, neck, or back pain. Pt passed EMS [facility]. Pt walked to and from restroom with EMS and then to the EMS stretcher. Pt told this RN that she has fallen out of bed multiple times in the past. Pt is following commands, no acute distress noted.

## 2016-03-07 NOTE — ED Notes (Signed)
[facility] called for pickup and transport to [facility].

## 2016-03-07 NOTE — ED Notes (Signed)
Pt ambulated in room with the assistance of a walker

## 2016-03-10 ENCOUNTER — Other Ambulatory Visit: Payer: Self-pay | Admitting: Internal Medicine

## 2016-03-30 DIAGNOSIS — H359 Unspecified retinal disorder: Secondary | ICD-10-CM | POA: Diagnosis not present

## 2016-03-30 DIAGNOSIS — H401132 Primary open-angle glaucoma, bilateral, moderate stage: Secondary | ICD-10-CM | POA: Diagnosis not present

## 2016-03-30 DIAGNOSIS — H35363 Drusen (degenerative) of macula, bilateral: Secondary | ICD-10-CM | POA: Diagnosis not present

## 2016-03-30 DIAGNOSIS — H353134 Nonexudative age-related macular degeneration, bilateral, advanced atrophic with subfoveal involvement: Secondary | ICD-10-CM | POA: Diagnosis not present

## 2016-04-01 ENCOUNTER — Other Ambulatory Visit: Payer: Self-pay | Admitting: Internal Medicine

## 2016-04-04 ENCOUNTER — Ambulatory Visit: Payer: Medicare Other

## 2016-04-04 ENCOUNTER — Ambulatory Visit (INDEPENDENT_AMBULATORY_CARE_PROVIDER_SITE_OTHER): Payer: Medicare Other | Admitting: Internal Medicine

## 2016-04-04 ENCOUNTER — Other Ambulatory Visit: Payer: Self-pay | Admitting: Internal Medicine

## 2016-04-04 ENCOUNTER — Other Ambulatory Visit: Payer: Self-pay | Admitting: *Deleted

## 2016-04-04 DIAGNOSIS — M810 Age-related osteoporosis without current pathological fracture: Secondary | ICD-10-CM

## 2016-04-04 MED ORDER — HYDROCODONE-ACETAMINOPHEN 5-325 MG PO TABS: ORAL_TABLET | ORAL | Status: DC

## 2016-04-04 MED ORDER — DENOSUMAB 60 MG/ML ~~LOC~~ SOLN
60.0000 mg | Freq: Once | SUBCUTANEOUS | Status: AC
  Administered 2016-04-04: 60 mg via SUBCUTANEOUS

## 2016-04-04 NOTE — Telephone Encounter (Signed)
Patient daughter requested and will pick up this afternoon.

## 2016-04-06 ENCOUNTER — Encounter: Payer: Self-pay | Admitting: Internal Medicine

## 2016-04-06 NOTE — Progress Notes (Signed)
prolia injection was given today by CMA.

## 2016-04-29 ENCOUNTER — Other Ambulatory Visit: Payer: Self-pay | Admitting: Internal Medicine

## 2016-05-01 ENCOUNTER — Other Ambulatory Visit: Payer: Self-pay | Admitting: Internal Medicine

## 2016-05-05 ENCOUNTER — Other Ambulatory Visit: Payer: Self-pay | Admitting: *Deleted

## 2016-05-05 MED ORDER — HYDROCODONE-ACETAMINOPHEN 5-325 MG PO TABS: ORAL_TABLET | ORAL | Status: DC

## 2016-05-05 NOTE — Telephone Encounter (Signed)
Left VM for patient to pick up prescription

## 2016-05-05 NOTE — Telephone Encounter (Signed)
Daughter requested and will pick up

## 2016-05-26 ENCOUNTER — Other Ambulatory Visit: Payer: Self-pay | Admitting: Internal Medicine

## 2016-06-05 ENCOUNTER — Other Ambulatory Visit: Payer: Self-pay | Admitting: *Deleted

## 2016-06-05 MED ORDER — HYDROCODONE-ACETAMINOPHEN 5-325 MG PO TABS: ORAL_TABLET | ORAL | 0 refills | Status: DC

## 2016-06-05 NOTE — Telephone Encounter (Signed)
Spoke with patient and advised rx ready for pick-up and it will be at the front desk.  

## 2016-06-05 NOTE — Telephone Encounter (Signed)
Jane [name], daughter requested and will pick up

## 2016-06-08 ENCOUNTER — Encounter: Payer: Self-pay | Admitting: Internal Medicine

## 2016-06-08 ENCOUNTER — Ambulatory Visit (INDEPENDENT_AMBULATORY_CARE_PROVIDER_SITE_OTHER): Payer: Medicare Other | Admitting: Internal Medicine

## 2016-06-08 VITALS — BP 138/60 | HR 71 | Temp 98.0°F | Ht 61.0 in | Wt 117.0 lb

## 2016-06-08 DIAGNOSIS — F29 Unspecified psychosis not due to a substance or known physiological condition: Secondary | ICD-10-CM | POA: Diagnosis not present

## 2016-06-08 DIAGNOSIS — G6289 Other specified polyneuropathies: Secondary | ICD-10-CM

## 2016-06-08 DIAGNOSIS — E038 Other specified hypothyroidism: Secondary | ICD-10-CM

## 2016-06-08 DIAGNOSIS — E034 Atrophy of thyroid (acquired): Secondary | ICD-10-CM

## 2016-06-08 DIAGNOSIS — H6123 Impacted cerumen, bilateral: Secondary | ICD-10-CM | POA: Diagnosis not present

## 2016-06-08 DIAGNOSIS — Z0000 Encounter for general adult medical examination without abnormal findings: Secondary | ICD-10-CM | POA: Diagnosis not present

## 2016-06-08 DIAGNOSIS — Z23 Encounter for immunization: Secondary | ICD-10-CM | POA: Diagnosis not present

## 2016-06-08 DIAGNOSIS — Y92099 Unspecified place in other non-institutional residence as the place of occurrence of the external cause: Secondary | ICD-10-CM | POA: Diagnosis not present

## 2016-06-08 DIAGNOSIS — F0391 Unspecified dementia with behavioral disturbance: Secondary | ICD-10-CM | POA: Diagnosis not present

## 2016-06-08 DIAGNOSIS — Y92009 Unspecified place in unspecified non-institutional (private) residence as the place of occurrence of the external cause: Secondary | ICD-10-CM

## 2016-06-08 DIAGNOSIS — W19XXXA Unspecified fall, initial encounter: Secondary | ICD-10-CM | POA: Diagnosis not present

## 2016-06-08 MED ORDER — ZOSTER VACCINE LIVE 19400 UNT/0.65ML ~~LOC~~ SUSR: 0.6500 mL | Freq: Once | SUBCUTANEOUS | 0 refills | Status: AC

## 2016-06-08 MED ORDER — LOVASTATIN 20 MG PO TABS: 20.0000 mg | ORAL_TABLET | Freq: Every evening | ORAL | 3 refills | Status: DC

## 2016-06-08 NOTE — Progress Notes (Signed)
Location:  [facility] clinic Provider: Lucelia Lacey L. Renato Gails, D.O., C.M.D.  Patient Care Team: Kermit Balo, DO as PCP - General (Geriatric Medicine) Lenn Sink, DPM as Consulting Physician (Podiatry) Edmon Crape, MD as Consulting Physician (Ophthalmology) Mateo Flow, MD as Consulting Physician (Ophthalmology)  Extended Emergency Contact Information Primary Emergency Contact: Norman,[name] Address: [address]          [place] [address] [place] Home Phone: [phone] Mobile Phone: [phone] Relation: Daughter  Code Status: DNR Goals of Care: Advanced Directive information Advanced Directives 06/08/2016  Does patient have an advance directive? Yes  Type of Estate agent of [place];Out of facility DNR (pink MOST or yellow form)  Does patient want to make changes to advanced directive? -  Copy of advanced directive(s) in chart? Yes  Pre-existing out of facility DNR order (yellow form or pink MOST form) Pink MOST form placed in chart (order not valid for inpatient use)     Chief Complaint  Patient presents with  . Annual Exam    wellness exam  . MMSE    22/30 passed clock     HPI: Patient is a 82 y.o. female seen in today for an annual wellness exam.    7/30, fell on left hip.  Also fell in April, May.  She reports that her left hip pain is worse now than prior to her fall.  No xrays were done as she was able to move her hip as well as she was pre-fall.  She demonstrates for me her exercise routine in her wheelchair w/o any difficulty.  Tingling in feet is still bothersome--uses hydrocodone.  Previously did not tolerate lyrica or gabapentin.      Thinks she has worms in her skin and hair.  They crawl at night.  Has been using eucerin skin calming cream.  She had been very itchy.  She thought there was a hole in her head from the worms.  She seems to fixate on this and her hernia.  She is on seroquel for her psychosis 50mg  at hs and  in am.  She also gets prn ativan for anxiety.  She did not get any better when off ativan so apparently her psychosis is due to her dementia not the benzo in her case.  Hernia acts up every now and then.  If she eats certain foods, it gets worse on her right side.  She also says she thinks she's getting one on the left.    Depression screen [facility] 2/9 06/08/2016 05/24/2015 02/23/2015 11/16/2014 09/15/2014  Decreased Interest 0 0 1 0 0  Down, Depressed, Hopeless 0 0 1 0 0  PHQ - 2 Score 0 0 2 0 0  Altered sleeping - - 2 - -  Tired, decreased energy - - 1 - -  Change in appetite - - 0 - -  Feeling bad or failure about yourself  - - 0 - -  Trouble concentrating - - 0 - -  Moving slowly or fidgety/restless - - 1 - -  Suicidal thoughts - - 0 - -  PHQ-9 Score - - 6 - -  Difficult doing work/chores - - Somewhat difficult - -    Fall Risk  06/08/2016 12/15/2015 11/25/2015 08/23/2015 05/24/2015  Falls in the past year? Yes Yes No Yes Yes  Number falls in past yr: 2 or more 1 - 2 or more 2 or more  Injury with Fall? No Yes - Yes Yes  Risk Factor Category  - - - - -  Risk for fall due to : - - - - Impaired mobility  Follow up - - - - -   MMSE - Mini Mental State Exam 06/08/2016  Orientation to time 5  Orientation to Place 1  Registration 3  Attention/ Calculation 4  Recall 1  Language- name 2 objects 2  Language- repeat 1  Language- follow 3 step command 3  Language- read & follow direction 1  Write a sentence 1  Copy design 0  Total score 22  PASSED CLOCK.  Vision poor.   Health Maintenance  Topic Date Due  . ZOSTAVAX  08/08/1987  . INFLUENZA VACCINE  06/06/2016  . TETANUS/TDAP  12/17/2021  . DEXA SCAN  Completed  . PNA vac Low Risk Adult  Completed     Functional Status Survey: Is the patient deaf or have difficulty hearing?: Yes Does the patient have difficulty seeing, even when wearing glasses/contacts?: Yes Does the patient have difficulty concentrating, remembering, or making  decisions?: Yes Does the patient have difficulty walking or climbing stairs?: Yes Does the patient have difficulty dressing or bathing?: Yes Does the patient have difficulty doing errands alone such as visiting a doctor's office or shopping?: Yes Current Exercise Habits: Home exercise routine, Type of exercise: walking (and exercises in wheelchair), Time (Minutes): 15, Frequency (Times/Week): 7, Weekly Exercise (Minutes/Week): 105 Exercise limited by: orthopedic condition(s) Diet?  Regular diet, doesn't always like the food Vision Screening Comments: Dr. Luciana Axe 03/2016 Hearing:  HOH, has hearing aides, cerumen Dentition:  ok  Past Medical History:  Diagnosis Date  . Abdominal pain, right lower quadrant   . Abdominal pain, right upper quadrant   . Abnormal involuntary movements(781.0)   . Abnormality of gait   . Anal fissure   . Anemia, unspecified   . Anxiety state, unspecified   . CAP (community acquired pneumonia) 01/31/2014  . Chest pain, unspecified   . Closed fracture of maxillary sinus, left 06/18/2015  . Closed left hip fracture (HCC) 06/25/2014  . Emphysema of lung (HCC)   . Essential and other specified forms of tremor   . Fracture of orbital floor, left, closed 06/18/2015  . Gastritis 02/13/2013  . Humerus fracture   . Hyperlipidemia   . Hypertension   . Hyposmolality and/or hyponatremia   . Impacted cerumen   . Intestinal or peritoneal adhesions with obstruction (postoperative) (postinfection) (HCC)   . Laceration of left ear canal 04/13/2014  . Long term (current) use of anticoagulants   . Loss of weight   . Lumbago   . Macular degeneration (senile) of retina, unspecified   . Major depressive disorder, single episode, unspecified (HCC)   . Osteoarthrosis, unspecified whether generalized or localized, unspecified site   . Osteoporosis, senile   . Other and unspecified hyperlipidemia   . Other pulmonary embolism and infarction   . Other vitamin B12 deficiency anemia   .  Pain in limb   . Postoperative anemia due to acute blood loss 01/31/2014  . Unspecified cataract   . Unspecified essential hypertension   . Unspecified glaucoma   . Unspecified hearing loss   . Unspecified hypothyroidism   . Vitamin B 12 deficiency 06/19/2015    Past Surgical History:  Procedure Laterality Date  . ABDOMINAL HYSTERECTOMY    . BACK SURGERY  2003  . BLADDER SUSPENSION  1993  . EXCISIONAL HEMORRHOIDECTOMY  1980  . INTRAMEDULLARY (IM) NAIL INTERTROCHANTERIC Right 01/27/2014   Procedure: INTRAMEDULLARY (IM)  NAIL INTERTROCHANTRIC;  Surgeon: Sheral Apley, MD;  Location: [facility] OR;  Service: Orthopedics;  Laterality: Right;  . INTRAMEDULLARY (IM) NAIL INTERTROCHANTERIC Left 06/26/2014   Procedure: LEFT HIP INTRAMEDULLARY (IM) NAIL;  Surgeon: Cheral Almas, MD;  Location: [facility] OR;  Service: Orthopedics;  Laterality: Left;  . TRIGGER FINGER RELEASE  2009   Dr Teressa Senter    Family History  Problem Relation Age of Onset  . Congestive Heart Failure Mother   . Macular degeneration Mother   . Heart disease Mother   . Cancer Sister     lung cancer  . Arthritis Daughter   . Heart disease Daughter   . Glaucoma Sister   . Cataracts Sister     Social History   Social History  . Marital status: Married    Spouse name: N/A  . Number of children: N/A  . Years of education: N/A   Social History Main Topics  . Smoking status: Former Smoker    Years: 40.00    Types: Cigarettes  . Smokeless tobacco: Never Used  . Alcohol use No  . Drug use: No  . Sexual activity: No   Other Topics Concern  . None   Social History Narrative  . None    reports that she has quit smoking. Her smoking use included Cigarettes. She quit after 40.00 years of use. She has never used smokeless tobacco. She reports that she does not drink alcohol or use drugs.  Allergies  Allergen Reactions  . Lyrica [Pregabalin] Other (See Comments)    Auditory hallucinations  . Celebrex [Celecoxib]      unknown  . Chlorzoxazone     unknown  . Fentanyl Anxiety      Medication List       Accurate as of 06/08/16  2:27 PM. Always use your most recent med list.          acetaminophen 325 MG tablet Commonly known as:  TYLENOL Take 1 tablet (325 mg total) by mouth 3 (three) times daily.   albuterol 108 (90 Base) MCG/ACT inhaler Commonly known as:  PROVENTIL HFA;VENTOLIN HFA Inhale 1-2 puffs into the lungs every 6 (six) hours as needed for wheezing or shortness of breath.   amLODipine 5 MG tablet Commonly known as:  NORVASC Take 1 tablet (5 mg total) by mouth daily.   calcium carbonate 750 MG chewable tablet Commonly known as:  TUMS EX Chew 1 tablet by mouth daily.   calcium-vitamin D 500-200 MG-UNIT tablet Commonly known as:  OSCAL WITH D Take 1 tablet by mouth daily.   fexofenadine 180 MG tablet Commonly known as:  ALLEGRA Take 180 mg by mouth daily.   HYDROcodone-acetaminophen 5-325 MG tablet Commonly known as:  NORCO/VICODIN Take one tablet by mouth twice daily for pain   latanoprost 0.005 % ophthalmic solution Commonly known as:  XALATAN INSTILL 1 DROP INTO BOTH EYES AT BEDTIME.   levothyroxine 88 MCG tablet Commonly known as:  SYNTHROID, LEVOTHROID Take one tablet by mouth once daily 30 minutes before breakfast for thyroid.   LORazepam 0.5 MG tablet Commonly known as:  ATIVAN TAKE 1 TABLET BY MOUTH EVERY NIGHT AT BEDTIME   lovastatin 20 MG tablet Commonly known as:  MEVACOR Take 1 tablet (20 mg total) by mouth every evening.   polyethylene glycol powder powder Commonly known as:  GLYCOLAX/MIRALAX DISSOLVE 17 GRAMS IN LIQUID AND DRINK DAILY   QUEtiapine 50 MG tablet Commonly known as:  SEROQUEL Take one tablet by mouth every morning  and one at bedtime   sertraline 100 MG tablet Commonly known as:  ZOLOFT Take one tablet by mouth once daily   Vitamin D 2000 units tablet Take one tablet daily by mouth for Vitamin D      Review of Systems:  Review  of Systems  Constitutional: Negative for chills, fever, malaise/fatigue and weight loss.  HENT: Positive for hearing loss. Negative for congestion.   Eyes: Positive for blurred vision.       Severe macular--uses magnifying glass  Respiratory: Negative for cough and shortness of breath.   Cardiovascular: Negative for chest pain, palpitations and leg swelling.  Gastrointestinal: Positive for abdominal pain and constipation. Negative for blood in stool, diarrhea, heartburn and melena.       Right femoral hernia  Genitourinary: Positive for urgency. Negative for dysuria and frequency.  Musculoskeletal: Positive for falls, joint pain and myalgias.       Left hip pain  Skin: Positive for itching. Negative for rash.       Has sensation of worms crawling in her skin of her scalp and face; has burning/tingling an numbness of feet and lower legs  Neurological: Negative for dizziness, loss of consciousness, weakness and headaches.  Endo/Heme/Allergies: Bruises/bleeds easily.  Psychiatric/Behavioral: Positive for hallucinations and memory loss. Negative for depression. The patient is nervous/anxious.        Tactile hallucination/delusion of worms in skin; historically has also had auditory hallucinations    Physical Exam: Vitals:   06/08/16 1339  BP: 138/60  Pulse: 71  Temp: 98 F (36.7 C)  TempSrc: Oral  SpO2: 94%  Weight: 117 lb (53.1 kg)  Height: 5\' 1"  (1.549 m)   Body mass index is 22.11 kg/m. Physical Exam  Constitutional: She appears well-developed and well-nourished. No distress.  HENT:  Head: Normocephalic and atraumatic.  Right Ear: External ear normal.  Left Ear: External ear normal.  Nose: Nose normal.  Mouth/Throat: Oropharynx is clear and moist.  Dentures (top loose and came down during exam), bilateral ears with some cerumen  Eyes: Conjunctivae and EOM are normal. Pupils are equal, round, and reactive to light.  Glasses; poor vision, uses magnifying glass for reading    Neck: Normal range of motion. Neck supple. No JVD present. No thyromegaly present.  Cardiovascular: Normal rate, regular rhythm, normal heart sounds and intact distal pulses.   Pulmonary/Chest: Effort normal and breath sounds normal. No respiratory distress.  Abdominal: Soft. Bowel sounds are normal. She exhibits no distension. There is no tenderness. There is no guarding.  Musculoskeletal: Normal range of motion. She exhibits no tenderness.  Neurological: She is alert.  Oriented to person, place, repeats herself some during visit  Skin: Skin is warm and dry. There is pallor.  Psychiatric: She has a normal mood and affect.    Labs reviewed: Basic Metabolic Panel:  Recent Labs  1510  06/18/15 0956 06/19/15 0751 08/23/15 1632 11/26/15 01/04/16  NA  --   < > 139 141 143 139 142  K  --   < > 3.2* 3.9 4.0 4.6 4.2  CL  --   < > 106 110 101  --   --   CO2  --   < > 28 24 25   --   --   GLUCOSE  --   < > 105* 92 86  --   --   BUN  --   < > 17 15 24 19 21   CREATININE 0.83  < > 0.65 0.62 0.76 0.7  0.7  CALCIUM  --   < > 8.8* 9.0 9.9  --   --   TSH 0.329*  --   --   --  0.601  --   --   < > = values in this interval not displayed. Liver Function Tests:  Recent Labs  06/18/15 0956  AST 23  ALT 12*  ALKPHOS 53  BILITOT 0.6  PROT 6.0*  ALBUMIN 3.6   No results for input(s): LIPASE, AMYLASE in the last 8760 hours. No results for input(s): AMMONIA in the last 8760 hours. CBC:  Recent Labs  06/15/15 2348  06/18/15 0956 06/19/15 0751 08/23/15 1632 11/26/15 01/04/16  WBC 12.3*  < > 7.1 6.0 4.9 4.9 4.9  NEUTROABS 10.6*  --   --   --  2.9  --   --   HGB 11.2*  < > 9.1* 8.7*  --  11.5* 11.8*  HCT 34.4*  < > 27.5* 25.8* 37.9 35* 35*  MCV 93.0  < > 93.9 93.5 92  --   --   PLT 155  < > 166 167 203 191 177  < > = values in this interval not displayed. Lipid Panel: No results for input(s): CHOL, HDL, LDLCALC, TRIG, CHOLHDL, LDLDIRECT in the last 8760 hours. Lab Results   Component Value Date   HGBA1C 5.5 02/27/2015    Procedures: No EKG per daughter's request (not looking for new things, goal is comfort and she has no cardiac-related symptoms)  Assessment/Plan 1. Psychosis, unspecified psychosis type -ongoing with increase in seroquel -now having tactile hallucinations, previously had been auditory  2. Dementia, with behavioral disturbance -gradually progressive; has psychosis with it, not parkinsonian -cognition and hallucinations did not improve off of lorazepam but It has been tried  3. Other polyneuropathy (HCC) -ongoing, unfortunately cannot tolerate typical meds of lyrica and gabapentin  4. Cerumen impaction, bilateral - bilateral ears full of hard thick wax, I used a plastic loop instrument to remove some of it from her ears which were also flushed with resolution  5. Hypothyroidism due to acquired atrophy of thyroid -cont current synthroid first thing in am on empty stomach separate from other meds, esp ca, by at least  6. Fall at home, initial encounter -seems she had a contusion to her left hip and still has more tenderness there, but is walking once a day with her walker, otherwise using wheelchair to get around, cont hydrocodone for pain -is on ca with D and additional D for osteoporosis--her daughter previously opted out of the bone density at this stage (pt needs help getting onto the table for the test so probably wouldn't work out anyway)  7. Need for Zostavax administration -new Rx provided as they never went to pharmacy last time and since lost the Rx - Zoster Vaccine Live, PF, (ZOSTAVAX) 66063 UNT/0.65ML injection; Inject 19,400 Units into the skin once.  Dispense: 1 each; Refill: 0   Labs/tests ordered:  No orders of the defined types were placed in this encounter.  Next appt:  09/14/2016 med mgt   Lamiah Marmol L. Niccole Witthuhn, D.O. Geriatrics [facility] [address], [place]  [address] Cell Phone (Mon-Fri 8am-5pm):  [phone] On Call:  [phone] & follow prompts after 5pm & weekends Office Phone:  [phone] Office Fax:  [phone]

## 2016-07-05 ENCOUNTER — Telehealth: Payer: Self-pay | Admitting: *Deleted

## 2016-07-05 ENCOUNTER — Other Ambulatory Visit: Payer: Self-pay | Admitting: Internal Medicine

## 2016-07-05 DIAGNOSIS — R112 Nausea with vomiting, unspecified: Secondary | ICD-10-CM

## 2016-07-05 MED ORDER — PROMETHAZINE HCL 25 MG PO TABS: 25.0000 mg | ORAL_TABLET | Freq: Three times a day (TID) | ORAL | 0 refills | Status: DC | PRN

## 2016-07-05 NOTE — Telephone Encounter (Signed)
Patient daughter, [name] called and stated that patient has Nausea and an Injectable for Phenergan was called into the [facility] from [employer], but they cannot do injectable and needs pill form. Can something be faxed to pharmacy in pill form for nausea. Please Advise.

## 2016-07-05 NOTE — Telephone Encounter (Signed)
Sent as requested.

## 2016-07-05 NOTE — Telephone Encounter (Signed)
Please notify her daughter and [facility] that phenergan is bound to make her more confused or to hallucinate.  zofran might do better 4mg  po q 6 hrs prn nausea.

## 2016-07-05 NOTE — Telephone Encounter (Signed)
Theador Hawthorne called from [facility] requesting that order for by mouth phenergan be sent to them also @ [phone]

## 2016-07-06 MED ORDER — HYDROCODONE-ACETAMINOPHEN 5-325 MG PO TABS: ORAL_TABLET | ORAL | 0 refills | Status: DC

## 2016-07-06 NOTE — Telephone Encounter (Signed)
Left message on voicemail informing [name] of Dr.Reed's and Dr.Green's responses. [name] informed to call if she would like to try zofran as an alternative, otherwise order was sent to patient's pharmacy and [facility] yesterday about 5:15 pm

## 2016-07-10 ENCOUNTER — Other Ambulatory Visit: Payer: Self-pay | Admitting: Internal Medicine

## 2016-07-20 ENCOUNTER — Telehealth: Payer: Self-pay

## 2016-07-20 NOTE — Telephone Encounter (Signed)
Message left on triage voicemail : please call to discuss orders

## 2016-07-20 NOTE — Telephone Encounter (Signed)
Spoke with Derrill Memo need clarification on order written by Dr.Carter on 07/19/16 that indicated Neuro check every 2 hours x 24 hours. What does she mean by neuro check- nurse states that's a broad statement.  Dr.Carter please advise   Order request for neosporin to right wrist and elbow for scrap related to fall

## 2016-07-21 MED ORDER — TRIPLE ANTIBIOTIC 5-400-5000 EX OINT: 1.0000 "application " | TOPICAL_OINTMENT | CUTANEOUS | 1 refills | Status: DC | PRN

## 2016-07-21 NOTE — Telephone Encounter (Signed)
Order for neosporin faxed to [facility]

## 2016-07-21 NOTE — Telephone Encounter (Signed)
Yes this is ok 

## 2016-07-21 NOTE — Telephone Encounter (Signed)
Jane [name] is aware of response regarding neuro check from Dr.Carter.  Please advise on request for neosporin in Dr Ernest Mallick absence

## 2016-07-21 NOTE — Telephone Encounter (Signed)
Check her neurologic status every 2 hrs to ensure no changes in strength, mental status, speech, etc.

## 2016-07-27 ENCOUNTER — Other Ambulatory Visit: Payer: Self-pay | Admitting: Internal Medicine

## 2016-08-04 ENCOUNTER — Other Ambulatory Visit: Payer: Self-pay | Admitting: *Deleted

## 2016-08-04 MED ORDER — HYDROCODONE-ACETAMINOPHEN 5-325 MG PO TABS: ORAL_TABLET | ORAL | 0 refills | Status: DC

## 2016-08-04 NOTE — Telephone Encounter (Signed)
Patient daughter requested and will pick up 

## 2016-08-21 ENCOUNTER — Other Ambulatory Visit: Payer: Self-pay | Admitting: Internal Medicine

## 2016-09-01 DIAGNOSIS — Z23 Encounter for immunization: Secondary | ICD-10-CM | POA: Diagnosis not present

## 2016-09-04 ENCOUNTER — Other Ambulatory Visit: Payer: Self-pay | Admitting: *Deleted

## 2016-09-04 MED ORDER — HYDROCODONE-ACETAMINOPHEN 5-325 MG PO TABS: ORAL_TABLET | ORAL | 0 refills | Status: DC

## 2016-09-04 NOTE — Telephone Encounter (Signed)
Patient daughter requested and will pick up 

## 2016-09-14 ENCOUNTER — Encounter: Payer: Self-pay | Admitting: Internal Medicine

## 2016-09-14 ENCOUNTER — Ambulatory Visit (INDEPENDENT_AMBULATORY_CARE_PROVIDER_SITE_OTHER): Payer: Medicare Other | Admitting: Internal Medicine

## 2016-09-14 VITALS — BP 138/68 | HR 87 | Temp 97.6°F | Wt 118.0 lb

## 2016-09-14 DIAGNOSIS — M810 Age-related osteoporosis without current pathological fracture: Secondary | ICD-10-CM

## 2016-09-14 DIAGNOSIS — G6289 Other specified polyneuropathies: Secondary | ICD-10-CM

## 2016-09-14 DIAGNOSIS — I10 Essential (primary) hypertension: Secondary | ICD-10-CM

## 2016-09-14 DIAGNOSIS — F0151 Vascular dementia with behavioral disturbance: Secondary | ICD-10-CM

## 2016-09-14 DIAGNOSIS — E034 Atrophy of thyroid (acquired): Secondary | ICD-10-CM | POA: Diagnosis not present

## 2016-09-14 DIAGNOSIS — G8929 Other chronic pain: Secondary | ICD-10-CM | POA: Diagnosis not present

## 2016-09-14 DIAGNOSIS — F01518 Vascular dementia, unspecified severity, with other behavioral disturbance: Secondary | ICD-10-CM

## 2016-09-14 DIAGNOSIS — M5442 Lumbago with sciatica, left side: Secondary | ICD-10-CM | POA: Diagnosis not present

## 2016-09-14 LAB — COMPLETE METABOLIC PANEL WITH GFR
ALT: 12 U/L (ref 6–29)
AST: 21 U/L (ref 10–35)
Albumin: 4.5 g/dL (ref 3.6–5.1)
Alkaline Phosphatase: 50 U/L (ref 33–130)
BUN: 19 mg/dL (ref 7–25)
CO2: 28 mmol/L (ref 20–31)
Calcium: 9.7 mg/dL (ref 8.6–10.4)
Chloride: 104 mmol/L (ref 98–110)
Creat: 0.74 mg/dL (ref 0.60–0.88)
GFR, Est African American: 83 mL/min (ref >=60)
GFR, Est Non African American: 72 mL/min (ref >=60)
Glucose, Bld: 86 mg/dL (ref 65–99)
Potassium: 4.3 mmol/L (ref 3.5–5.3)
Sodium: 141 mmol/L (ref 135–146)
Total Bilirubin: 0.5 mg/dL (ref 0.2–1.2)
Total Protein: 6.7 g/dL (ref 6.1–8.1)

## 2016-09-14 LAB — CBC WITH DIFFERENTIAL/PLATELET
Basophils Absolute: 0 cells/uL (ref 0–200)
Basophils Relative: 0 %
Eosinophils Absolute: 134 cells/uL (ref 15–500)
Eosinophils Relative: 2 %
HCT: 38.1 % (ref 35.0–45.0)
Hemoglobin: 12.4 g/dL (ref 11.7–15.5)
Lymphocytes Relative: 21 %
Lymphs Abs: 1407 cells/uL (ref 850–3900)
MCH: 30.9 pg (ref 27.0–33.0)
MCHC: 32.5 g/dL (ref 32.0–36.0)
MCV: 95 fL (ref 80.0–100.0)
MPV: 9.6 fL (ref 7.5–12.5)
Monocytes Absolute: 603 cells/uL (ref 200–950)
Monocytes Relative: 9 %
Neutro Abs: 4556 cells/uL (ref 1500–7800)
Neutrophils Relative %: 68 %
Platelets: 202 10*3/uL (ref 140–400)
RBC: 4.01 MIL/uL (ref 3.80–5.10)
RDW: 13.8 % (ref 11.0–15.0)
WBC: 6.7 10*3/uL (ref 3.8–10.8)

## 2016-09-14 LAB — TSH: TSH: 0.1 mIU/L — ABNORMAL LOW

## 2016-09-14 MED ORDER — LORAZEPAM 0.5 MG PO TABS: 0.5000 mg | ORAL_TABLET | Freq: Every day | ORAL | 5 refills | Status: DC

## 2016-09-14 MED ORDER — LATANOPROST 0.005 % OP SOLN: 1.0000 [drp] | Freq: Every day | OPHTHALMIC | 5 refills | Status: AC

## 2016-09-14 NOTE — Progress Notes (Signed)
Location:  [facility] clinic Provider:  Roy Snuffer L. Renato Gails, D.O., C.M.D.  Code Status: DNR Goals of Care:  Advanced Directives 09/14/2016  Does patient have an advance directive? Yes  Type of Estate agent of [place];Out of facility DNR (pink MOST or yellow form)  Does patient want to make changes to advanced directive? -  Copy of advanced directive(s) in chart? Yes  Pre-existing out of facility DNR order (yellow form or pink MOST form) Pink MOST form placed in chart (order not valid for inpatient use)   Chief Complaint  Patient presents with  . Medical Management of Chronic Issues    3 mth follow-up    HPI: Patient is a 82 y.o. female seen today for medical management of chronic diseases.    Weight stable.    Left leg hurting her more again.  Had one night where it was bad and she pushed her buzzer.  Says they couldn't give it to her.  Says she is hurting in the middle of the night quite often from her spine down her left leg.  Her foot feels like it has needles in it.  Tries to exercise it.  It doesn't help.  Warm and cold compresses are only temporary solutions.  fets stiff behind the left knee.  Her daughter reports that pt sits really far forward in her wheelchair and uses her feet to scoot around instead of pushing herself with her arms.  10/14/15 was when she was last d/c'd from PT.  Then falling restarted afterward.  PT was offered a Physiological scientist, but [name] opted not to take that then and do PT when I feel she needs it.    She did have a fall where she scratched her back and says staff was upset that she didn't tell them right away at [facility].      In talking with her daughter while pt was in the lab, she notes that since a lot of staff have turned over at her facility, her mother has been more agitated and sometimes having behaviors with care.  We discussed that increasing her meds may sedate her more, but will likely make her fall and not actually fix the  behavior.  [name] thinks a lot of it is the change in staff.  We also reviewed that dementia has stages and sometimes the behavioral spurts come and go.    Past Medical History:  Diagnosis Date  . Abdominal pain, right lower quadrant   . Abdominal pain, right upper quadrant   . Abnormal involuntary movements(781.0)   . Abnormality of gait   . Anal fissure   . Anemia, unspecified   . Anxiety state, unspecified   . CAP (community acquired pneumonia) 01/31/2014  . Chest pain, unspecified   . Closed fracture of maxillary sinus, left 06/18/2015  . Closed left hip fracture (HCC) 06/25/2014  . Emphysema of lung (HCC)   . Essential and other specified forms of tremor   . Fracture of orbital floor, left, closed 06/18/2015  . Gastritis 02/13/2013  . Humerus fracture   . Hyperlipidemia   . Hypertension   . Hyposmolality and/or hyponatremia   . Impacted cerumen   . Intestinal or peritoneal adhesions with obstruction (postoperative) (postinfection)   . Laceration of left ear canal 04/13/2014  . Long term (current) use of anticoagulants   . Loss of weight   . Lumbago   . Macular degeneration (senile) of retina, unspecified   . Major depressive disorder, single episode,  unspecified   . Osteoarthrosis, unspecified whether generalized or localized, unspecified site   . Osteoporosis, senile   . Other and unspecified hyperlipidemia   . Other pulmonary embolism and infarction   . Other vitamin B12 deficiency anemia   . Pain in limb   . Postoperative anemia due to acute blood loss 01/31/2014  . Unspecified cataract   . Unspecified essential hypertension   . Unspecified glaucoma(365.9)   . Unspecified hearing loss   . Unspecified hypothyroidism   . Vitamin B 12 deficiency 06/19/2015    Past Surgical History:  Procedure Laterality Date  . ABDOMINAL HYSTERECTOMY    . BACK SURGERY  2003  . BLADDER SUSPENSION  1993  . EXCISIONAL HEMORRHOIDECTOMY  1980  . INTRAMEDULLARY (IM) NAIL INTERTROCHANTERIC  Right 01/27/2014   Procedure: INTRAMEDULLARY (IM) NAIL INTERTROCHANTRIC;  Surgeon: Sheral Apley, MD;  Location: [facility] OR;  Service: Orthopedics;  Laterality: Right;  . INTRAMEDULLARY (IM) NAIL INTERTROCHANTERIC Left 06/26/2014   Procedure: LEFT HIP INTRAMEDULLARY (IM) NAIL;  Surgeon: Cheral Almas, MD;  Location: [facility] OR;  Service: Orthopedics;  Laterality: Left;  . TRIGGER FINGER RELEASE  2009   Dr Teressa Senter    Allergies  Allergen Reactions  . Lyrica [Pregabalin] Other (See Comments)    Auditory hallucinations  . Celebrex [Celecoxib]     unknown  . Chlorzoxazone     unknown  . Fentanyl Anxiety      Medication List       Accurate as of 09/14/16  1:33 PM. Always use your most recent med list.          acetaminophen 325 MG tablet Commonly known as:  TYLENOL Take 1 tablet (325 mg total) by mouth 3 (three) times daily.   albuterol 108 (90 Base) MCG/ACT inhaler Commonly known as:  PROVENTIL HFA;VENTOLIN HFA Inhale 1-2 puffs into the lungs every 6 (six) hours as needed for wheezing or shortness of breath.   amLODipine 5 MG tablet Commonly known as:  NORVASC Take 1 tablet (5 mg total) by mouth daily.   calcium carbonate 750 MG chewable tablet Commonly known as:  TUMS EX Chew 1 tablet by mouth daily.   calcium-vitamin D 500-200 MG-UNIT tablet Commonly known as:  OSCAL WITH D Take 1 tablet by mouth daily.   fexofenadine 180 MG tablet Commonly known as:  ALLEGRA Take 180 mg by mouth daily.   HYDROcodone-acetaminophen 5-325 MG tablet Commonly known as:  NORCO/VICODIN Take one tablet by mouth twice daily for pain   latanoprost 0.005 % ophthalmic solution Commonly known as:  XALATAN INSTILL 1 DROP INTO BOTH EYES AT BEDTIME   levothyroxine 88 MCG tablet Commonly known as:  SYNTHROID, LEVOTHROID Take one tablet by mouth once daily 30 minutes before breakfast for thyroid.   LORazepam 0.5 MG tablet Commonly known as:  ATIVAN TAKE 1 TABLET BY MOUTH AT BEDTIME     lovastatin 20 MG tablet Commonly known as:  MEVACOR Take 1 tablet (20 mg total) by mouth every evening.   polyethylene glycol powder powder Commonly known as:  GLYCOLAX/MIRALAX DISSOLVE 17 GRAMS IN LIQUID AND DRINK DAILY   QUEtiapine 50 MG tablet Commonly known as:  SEROQUEL TAKE 1 TABLET BY MOUTH EVERY MORNING AND AT BEDTIME.   sertraline 100 MG tablet Commonly known as:  ZOLOFT Take one tablet by mouth once daily   Vitamin D 2000 units tablet Take one tablet daily by mouth for Vitamin D       Review of Systems:  Review of Systems  Constitutional: Negative for chills, fever and malaise/fatigue.  HENT: Positive for hearing loss.   Eyes: Positive for blurred vision.  Respiratory: Negative for cough and shortness of breath.   Cardiovascular: Negative for chest pain, palpitations and leg swelling.  Gastrointestinal: Positive for constipation. Negative for abdominal pain, blood in stool, heartburn and melena.  Genitourinary: Negative for dysuria, frequency and urgency.  Musculoskeletal: Positive for back pain, falls and myalgias.  Skin: Negative for itching and rash.  Neurological: Positive for tingling, sensory change and weakness. Negative for dizziness and loss of consciousness.  Endo/Heme/Allergies: Bruises/bleeds easily.  Psychiatric/Behavioral: Positive for memory loss. Negative for depression. The patient does not have insomnia.        More agitated and angry lately    Health Maintenance  Topic Date Due  . ZOSTAVAX  08/08/1987  . TETANUS/TDAP  12/17/2021  . INFLUENZA VACCINE  Completed  . DEXA SCAN  Completed  . PNA vac Low Risk Adult  Completed    Physical Exam: Vitals:   09/14/16 1313  BP: 138/68  Pulse: 87  Temp: 97.6 F (36.4 C)  TempSrc: Oral  SpO2: 93%  Weight: 118 lb (53.5 kg)   Body mass index is 22.3 kg/m. Physical Exam  Constitutional:  Frail white female  HENT:  Head: Normocephalic and atraumatic.  Right Ear: External ear normal.   Left Ear: External ear normal.  Nose: Nose normal.  Mouth/Throat: Oropharynx is clear and moist. No oropharyngeal exudate.  No significant cerumen today  Eyes: Conjunctivae and EOM are normal. Pupils are equal, round, and reactive to light.  glasses  Cardiovascular: Normal rate, regular rhythm, normal heart sounds and intact distal pulses.   Pulmonary/Chest: Effort normal and breath sounds normal. No respiratory distress. She has no wheezes.  Abdominal: Soft. Bowel sounds are normal. She exhibits no distension. There is no tenderness. A hernia is present.  Musculoskeletal: Normal range of motion.  Tenderness of left hip that is worse since her fall, goes from her back down her left buttock; pt watched pushing wheelchair and does sit near front edge and scoot with feet unless counseled to sit farther back and use her hands   Neurological: She is alert.  Oriented to person and place, not exact time; aware of season, month; diminished sensation in feet bilaterally  Skin: Skin is warm and dry.  Psychiatric:  Is more agitated today; still pleasant to me, but complaining about a lot of things and negative attitude    Labs reviewed: Basic Metabolic Panel:  Recent Labs  21/11/73 01/04/16  NA 139 142  K 4.6 4.2  BUN 19 21  CREATININE 0.7 0.7   Liver Function Tests: No results for input(s): AST, ALT, ALKPHOS, BILITOT, PROT, ALBUMIN in the last 8760 hours. No results for input(s): LIPASE, AMYLASE in the last 8760 hours. No results for input(s): AMMONIA in the last 8760 hours. CBC:  Recent Labs  11/26/15 01/04/16  WBC 4.9 4.9  HGB 11.5* 11.8*  HCT 35* 35*  PLT 191 177   Lipid Panel: No results for input(s): CHOL, HDL, LDLCALC, TRIG, CHOLHDL, LDLDIRECT in the last 8760 hours. Lab Results  Component Value Date   HGBA1C 5.5 02/27/2015    Assessment/Plan 1. Essential hypertension, benign - bp controlled and w/o dizziness that she reports since last visit--on only amlodipine 5mg   (will see where bp next time--?if she needs this) - CBC with Differential/Platelet - COMPLETE METABOLIC PANEL WITH GFR  2. Hypothyroidism due to acquired atrophy of thyroid -cont current levothyroxine  and f/u tsh - TSH  3. Vascular dementia with behavior disturbance -is having more behavior and agitation with changes in her ILF staff -she is not getting as much attention per her daughter -will keep meds the same--she has not tolerated a dose reduction of her seroquel in the past due to return of her hallucinations that led to more agitation; uses ativan to calm her nerves to sleep at night and has not tolerated me stopping this either (staff send notes about it 1-2 days later) -is on zoloft for depression also  4. Other polyneuropathy (HCC) -longstanding problem for her and not helping her gait and falls -did not tolerate gabapentin or lyrica which made her hallucinations much worse also  5. Senile osteoporosis -continue her on prolia injections to prevent further fxs, has scheduled appt for this coming up, cont ca with D and additional D supplement  6. Chronic left-sided low back pain with left-sided sciatica -will ask  PT, OT to see her at [facility] to help her to properly propel her wheelchair or possibly to recommend different cushions/supports to make it more comfortable for her also (see hpi) -cont prn hydrocodone for pain which she cannot go w/o due to severe pain -has had xrays since the fall that increased her pain and no new fxs were identified (pt does not recall) -also is on scheduled low dose tylenol  Labs/tests ordered:   Orders Placed This Encounter  Procedures  . TSH  . CBC with Differential/Platelet  . COMPLETE METABOLIC PANEL WITH GFR    [facility]   Next appt:  10/09/2016 for prolia; 3 mos with me for med mgt   Sanjith Siwek L. Velencia Lenart, D.O. Geriatrics [facility] [address], [place] [address] Cell Phone (Mon-Fri  8am-5pm):  [phone] On Call:  [phone] & follow prompts after 5pm & weekends Office Phone:  [phone] Office Fax:  [phone]

## 2016-09-15 ENCOUNTER — Other Ambulatory Visit: Payer: Self-pay | Admitting: Internal Medicine

## 2016-09-15 ENCOUNTER — Other Ambulatory Visit: Payer: Self-pay

## 2016-09-15 MED ORDER — LEVOTHYROXINE SODIUM 75 MCG PO TABS: 75.0000 ug | ORAL_TABLET | Freq: Every day | ORAL | 0 refills | Status: DC

## 2016-09-15 NOTE — Telephone Encounter (Signed)
Spoke with daughter [name] regarding medication change and labs, she expressed understanding. Labs faxed to [facility], medication changed.

## 2016-09-15 NOTE — Telephone Encounter (Signed)
Patient daughter requested #[phone]

## 2016-09-22 DIAGNOSIS — S42201D Unspecified fracture of upper end of right humerus, subsequent encounter for fracture with routine healing: Secondary | ICD-10-CM | POA: Diagnosis not present

## 2016-09-22 DIAGNOSIS — G8929 Other chronic pain: Secondary | ICD-10-CM | POA: Diagnosis not present

## 2016-09-22 DIAGNOSIS — R279 Unspecified lack of coordination: Secondary | ICD-10-CM | POA: Diagnosis not present

## 2016-09-26 DIAGNOSIS — G8929 Other chronic pain: Secondary | ICD-10-CM | POA: Diagnosis not present

## 2016-09-26 DIAGNOSIS — R279 Unspecified lack of coordination: Secondary | ICD-10-CM | POA: Diagnosis not present

## 2016-09-26 DIAGNOSIS — S42201D Unspecified fracture of upper end of right humerus, subsequent encounter for fracture with routine healing: Secondary | ICD-10-CM | POA: Diagnosis not present

## 2016-09-27 DIAGNOSIS — R279 Unspecified lack of coordination: Secondary | ICD-10-CM | POA: Diagnosis not present

## 2016-09-27 DIAGNOSIS — S42201D Unspecified fracture of upper end of right humerus, subsequent encounter for fracture with routine healing: Secondary | ICD-10-CM | POA: Diagnosis not present

## 2016-09-27 DIAGNOSIS — G8929 Other chronic pain: Secondary | ICD-10-CM | POA: Diagnosis not present

## 2016-10-03 DIAGNOSIS — G8929 Other chronic pain: Secondary | ICD-10-CM | POA: Diagnosis not present

## 2016-10-03 DIAGNOSIS — R279 Unspecified lack of coordination: Secondary | ICD-10-CM | POA: Diagnosis not present

## 2016-10-03 DIAGNOSIS — S42201D Unspecified fracture of upper end of right humerus, subsequent encounter for fracture with routine healing: Secondary | ICD-10-CM | POA: Diagnosis not present

## 2016-10-04 ENCOUNTER — Other Ambulatory Visit: Payer: Self-pay | Admitting: *Deleted

## 2016-10-04 MED ORDER — HYDROCODONE-ACETAMINOPHEN 5-325 MG PO TABS
ORAL_TABLET | ORAL | 0 refills | Status: DC
Start: 2016-10-04 — End: 2016-11-02

## 2016-10-04 NOTE — Telephone Encounter (Signed)
[name], daughter requested and will pick up 

## 2016-10-05 DIAGNOSIS — S42201D Unspecified fracture of upper end of right humerus, subsequent encounter for fracture with routine healing: Secondary | ICD-10-CM | POA: Diagnosis not present

## 2016-10-05 DIAGNOSIS — G8929 Other chronic pain: Secondary | ICD-10-CM | POA: Diagnosis not present

## 2016-10-05 DIAGNOSIS — R279 Unspecified lack of coordination: Secondary | ICD-10-CM | POA: Diagnosis not present

## 2016-10-09 ENCOUNTER — Ambulatory Visit (INDEPENDENT_AMBULATORY_CARE_PROVIDER_SITE_OTHER): Payer: Medicare Other

## 2016-10-09 ENCOUNTER — Other Ambulatory Visit: Payer: Self-pay | Admitting: Internal Medicine

## 2016-10-09 DIAGNOSIS — M810 Age-related osteoporosis without current pathological fracture: Secondary | ICD-10-CM

## 2016-10-09 MED ORDER — DENOSUMAB 60 MG/ML ~~LOC~~ SOLN
60.0000 mg | Freq: Once | SUBCUTANEOUS | Status: AC
  Administered 2016-10-09: 60 mg via SUBCUTANEOUS

## 2016-10-09 MED ORDER — DENOSUMAB 60 MG/ML ~~LOC~~ SOLN: 60.0000 mg | Freq: Once | SUBCUTANEOUS | 0 refills | Status: AC

## 2016-10-11 ENCOUNTER — Encounter: Payer: Self-pay | Admitting: Internal Medicine

## 2016-10-11 DIAGNOSIS — N390 Urinary tract infection, site not specified: Secondary | ICD-10-CM | POA: Diagnosis not present

## 2016-10-11 DIAGNOSIS — R279 Unspecified lack of coordination: Secondary | ICD-10-CM | POA: Diagnosis not present

## 2016-10-11 DIAGNOSIS — S42201D Unspecified fracture of upper end of right humerus, subsequent encounter for fracture with routine healing: Secondary | ICD-10-CM | POA: Diagnosis not present

## 2016-10-11 DIAGNOSIS — G8929 Other chronic pain: Secondary | ICD-10-CM | POA: Diagnosis not present

## 2016-10-11 DIAGNOSIS — R319 Hematuria, unspecified: Secondary | ICD-10-CM | POA: Diagnosis not present

## 2016-10-12 DIAGNOSIS — R279 Unspecified lack of coordination: Secondary | ICD-10-CM | POA: Diagnosis not present

## 2016-10-12 DIAGNOSIS — G8929 Other chronic pain: Secondary | ICD-10-CM | POA: Diagnosis not present

## 2016-10-12 DIAGNOSIS — S42201D Unspecified fracture of upper end of right humerus, subsequent encounter for fracture with routine healing: Secondary | ICD-10-CM | POA: Diagnosis not present

## 2016-10-17 DIAGNOSIS — G8929 Other chronic pain: Secondary | ICD-10-CM | POA: Diagnosis not present

## 2016-10-17 DIAGNOSIS — R279 Unspecified lack of coordination: Secondary | ICD-10-CM | POA: Diagnosis not present

## 2016-10-17 DIAGNOSIS — S42201D Unspecified fracture of upper end of right humerus, subsequent encounter for fracture with routine healing: Secondary | ICD-10-CM | POA: Diagnosis not present

## 2016-10-18 DIAGNOSIS — S42201D Unspecified fracture of upper end of right humerus, subsequent encounter for fracture with routine healing: Secondary | ICD-10-CM | POA: Diagnosis not present

## 2016-10-18 DIAGNOSIS — G8929 Other chronic pain: Secondary | ICD-10-CM | POA: Diagnosis not present

## 2016-10-18 DIAGNOSIS — R279 Unspecified lack of coordination: Secondary | ICD-10-CM | POA: Diagnosis not present

## 2016-10-24 DIAGNOSIS — S42201D Unspecified fracture of upper end of right humerus, subsequent encounter for fracture with routine healing: Secondary | ICD-10-CM | POA: Diagnosis not present

## 2016-10-24 DIAGNOSIS — G8929 Other chronic pain: Secondary | ICD-10-CM | POA: Diagnosis not present

## 2016-10-24 DIAGNOSIS — R279 Unspecified lack of coordination: Secondary | ICD-10-CM | POA: Diagnosis not present

## 2016-10-25 DIAGNOSIS — S42201D Unspecified fracture of upper end of right humerus, subsequent encounter for fracture with routine healing: Secondary | ICD-10-CM | POA: Diagnosis not present

## 2016-10-25 DIAGNOSIS — R279 Unspecified lack of coordination: Secondary | ICD-10-CM | POA: Diagnosis not present

## 2016-10-25 DIAGNOSIS — G8929 Other chronic pain: Secondary | ICD-10-CM | POA: Diagnosis not present

## 2016-11-02 ENCOUNTER — Other Ambulatory Visit: Payer: Self-pay

## 2016-11-02 DIAGNOSIS — S42201D Unspecified fracture of upper end of right humerus, subsequent encounter for fracture with routine healing: Secondary | ICD-10-CM | POA: Diagnosis not present

## 2016-11-02 DIAGNOSIS — R279 Unspecified lack of coordination: Secondary | ICD-10-CM | POA: Diagnosis not present

## 2016-11-02 DIAGNOSIS — G8929 Other chronic pain: Secondary | ICD-10-CM | POA: Diagnosis not present

## 2016-11-03 DIAGNOSIS — G8929 Other chronic pain: Secondary | ICD-10-CM | POA: Diagnosis not present

## 2016-11-03 DIAGNOSIS — S42201D Unspecified fracture of upper end of right humerus, subsequent encounter for fracture with routine healing: Secondary | ICD-10-CM | POA: Diagnosis not present

## 2016-11-03 DIAGNOSIS — R279 Unspecified lack of coordination: Secondary | ICD-10-CM | POA: Diagnosis not present

## 2016-11-03 MED ORDER — HYDROCODONE-ACETAMINOPHEN 5-325 MG PO TABS: ORAL_TABLET | ORAL | 0 refills | Status: DC

## 2016-11-07 DIAGNOSIS — R279 Unspecified lack of coordination: Secondary | ICD-10-CM | POA: Diagnosis not present

## 2016-11-07 DIAGNOSIS — G8929 Other chronic pain: Secondary | ICD-10-CM | POA: Diagnosis not present

## 2016-11-07 DIAGNOSIS — S42201D Unspecified fracture of upper end of right humerus, subsequent encounter for fracture with routine healing: Secondary | ICD-10-CM | POA: Diagnosis not present

## 2016-11-09 ENCOUNTER — Other Ambulatory Visit: Payer: Self-pay | Admitting: Internal Medicine

## 2016-11-10 DIAGNOSIS — G8929 Other chronic pain: Secondary | ICD-10-CM | POA: Diagnosis not present

## 2016-11-10 DIAGNOSIS — S42201D Unspecified fracture of upper end of right humerus, subsequent encounter for fracture with routine healing: Secondary | ICD-10-CM | POA: Diagnosis not present

## 2016-11-10 DIAGNOSIS — R279 Unspecified lack of coordination: Secondary | ICD-10-CM | POA: Diagnosis not present

## 2016-11-15 DIAGNOSIS — S42201D Unspecified fracture of upper end of right humerus, subsequent encounter for fracture with routine healing: Secondary | ICD-10-CM | POA: Diagnosis not present

## 2016-11-15 DIAGNOSIS — G8929 Other chronic pain: Secondary | ICD-10-CM | POA: Diagnosis not present

## 2016-11-15 DIAGNOSIS — R279 Unspecified lack of coordination: Secondary | ICD-10-CM | POA: Diagnosis not present

## 2016-11-17 DIAGNOSIS — S42201D Unspecified fracture of upper end of right humerus, subsequent encounter for fracture with routine healing: Secondary | ICD-10-CM | POA: Diagnosis not present

## 2016-11-17 DIAGNOSIS — G8929 Other chronic pain: Secondary | ICD-10-CM | POA: Diagnosis not present

## 2016-11-17 DIAGNOSIS — R279 Unspecified lack of coordination: Secondary | ICD-10-CM | POA: Diagnosis not present

## 2016-12-04 ENCOUNTER — Other Ambulatory Visit: Payer: Self-pay | Admitting: *Deleted

## 2016-12-04 MED ORDER — HYDROCODONE-ACETAMINOPHEN 5-325 MG PO TABS: ORAL_TABLET | ORAL | 0 refills | Status: DC

## 2016-12-04 NOTE — Telephone Encounter (Signed)
[name], daughter requested and will pick up 

## 2016-12-11 DIAGNOSIS — H401132 Primary open-angle glaucoma, bilateral, moderate stage: Secondary | ICD-10-CM | POA: Diagnosis not present

## 2016-12-11 DIAGNOSIS — H35033 Hypertensive retinopathy, bilateral: Secondary | ICD-10-CM | POA: Diagnosis not present

## 2016-12-11 DIAGNOSIS — H353132 Nonexudative age-related macular degeneration, bilateral, intermediate dry stage: Secondary | ICD-10-CM | POA: Diagnosis not present

## 2016-12-11 DIAGNOSIS — H3562 Retinal hemorrhage, left eye: Secondary | ICD-10-CM | POA: Diagnosis not present

## 2016-12-21 ENCOUNTER — Ambulatory Visit: Payer: Medicare Other | Admitting: Internal Medicine

## 2017-01-03 ENCOUNTER — Other Ambulatory Visit: Payer: Self-pay | Admitting: *Deleted

## 2017-01-03 MED ORDER — HYDROCODONE-ACETAMINOPHEN 5-325 MG PO TABS
ORAL_TABLET | ORAL | 0 refills | Status: DC
Start: 2017-01-03 — End: 2017-01-23

## 2017-01-03 NOTE — Telephone Encounter (Signed)
Patient daughter, [name] requested and will pick up

## 2017-01-16 ENCOUNTER — Emergency Department (HOSPITAL_COMMUNITY)
Admission: EM | Admit: 2017-01-16 | Discharge: 2017-01-16 | Disposition: A | Discharge status: Home / Self Care | Payer: Medicare Other | Attending: Emergency Medicine | Admitting: Emergency Medicine

## 2017-01-16 ENCOUNTER — Emergency Department (HOSPITAL_COMMUNITY): Payer: Medicare Other

## 2017-01-16 ENCOUNTER — Encounter (HOSPITAL_COMMUNITY): Payer: Self-pay

## 2017-01-16 DIAGNOSIS — R296 Repeated falls: Secondary | ICD-10-CM | POA: Diagnosis not present

## 2017-01-16 DIAGNOSIS — S199XXA Unspecified injury of neck, initial encounter: Secondary | ICD-10-CM | POA: Diagnosis not present

## 2017-01-16 DIAGNOSIS — S20212A Contusion of left front wall of thorax, initial encounter: Secondary | ICD-10-CM | POA: Insufficient documentation

## 2017-01-16 DIAGNOSIS — F0390 Unspecified dementia without behavioral disturbance: Secondary | ICD-10-CM | POA: Insufficient documentation

## 2017-01-16 DIAGNOSIS — S0990XA Unspecified injury of head, initial encounter: Secondary | ICD-10-CM | POA: Diagnosis not present

## 2017-01-16 DIAGNOSIS — E039 Hypothyroidism, unspecified: Secondary | ICD-10-CM | POA: Diagnosis not present

## 2017-01-16 DIAGNOSIS — R079 Chest pain, unspecified: Secondary | ICD-10-CM | POA: Diagnosis not present

## 2017-01-16 DIAGNOSIS — I10 Essential (primary) hypertension: Secondary | ICD-10-CM | POA: Insufficient documentation

## 2017-01-16 DIAGNOSIS — Y92129 Unspecified place in nursing home as the place of occurrence of the external cause: Secondary | ICD-10-CM | POA: Diagnosis not present

## 2017-01-16 DIAGNOSIS — W19XXXA Unspecified fall, initial encounter: Secondary | ICD-10-CM | POA: Insufficient documentation

## 2017-01-16 DIAGNOSIS — M545 Low back pain: Secondary | ICD-10-CM | POA: Diagnosis not present

## 2017-01-16 DIAGNOSIS — Z87891 Personal history of nicotine dependence: Secondary | ICD-10-CM | POA: Insufficient documentation

## 2017-01-16 DIAGNOSIS — J449 Chronic obstructive pulmonary disease, unspecified: Secondary | ICD-10-CM | POA: Diagnosis not present

## 2017-01-16 DIAGNOSIS — Y999 Unspecified external cause status: Secondary | ICD-10-CM | POA: Insufficient documentation

## 2017-01-16 DIAGNOSIS — S299XXA Unspecified injury of thorax, initial encounter: Secondary | ICD-10-CM | POA: Diagnosis present

## 2017-01-16 DIAGNOSIS — Y939 Activity, unspecified: Secondary | ICD-10-CM | POA: Insufficient documentation

## 2017-01-16 DIAGNOSIS — M542 Cervicalgia: Secondary | ICD-10-CM | POA: Diagnosis not present

## 2017-01-16 LAB — URINALYSIS, ROUTINE W REFLEX MICROSCOPIC
BACTERIA UA: NONE SEEN
BILIRUBIN URINE: NEGATIVE
Glucose, UA: NEGATIVE mg/dL
Hgb urine dipstick: NEGATIVE
KETONES UR: NEGATIVE mg/dL
Nitrite: NEGATIVE
PH: 6 (ref 5.0–8.0)
Protein, ur: NEGATIVE mg/dL
Specific Gravity, Urine: 1.013 (ref 1.005–1.030)

## 2017-01-16 LAB — CBC WITH DIFFERENTIAL/PLATELET
BASOS ABS: 0 10*3/uL (ref 0.0–0.1)
Basophils Relative: 0 %
Eosinophils Absolute: 0.1 10*3/uL (ref 0.0–0.7)
Eosinophils Relative: 3 %
HEMATOCRIT: 34.2 % — AB (ref 36.0–46.0)
Hemoglobin: 11.8 g/dL — ABNORMAL LOW (ref 12.0–15.0)
LYMPHS ABS: 1.3 10*3/uL (ref 0.7–4.0)
LYMPHS PCT: 24 %
MCH: 32.2 pg (ref 26.0–34.0)
MCHC: 34.5 g/dL (ref 30.0–36.0)
MCV: 93.4 fL (ref 78.0–100.0)
Monocytes Absolute: 0.6 10*3/uL (ref 0.1–1.0)
Monocytes Relative: 10 %
NEUTROS ABS: 3.6 10*3/uL (ref 1.7–7.7)
Neutrophils Relative %: 63 %
Platelets: 181 10*3/uL (ref 150–400)
RBC: 3.66 MIL/uL — AB (ref 3.87–5.11)
RDW: 13.5 % (ref 11.5–15.5)
WBC: 5.6 10*3/uL (ref 4.0–10.5)

## 2017-01-16 LAB — BASIC METABOLIC PANEL
ANION GAP: 5 (ref 5–15)
BUN: 21 mg/dL — ABNORMAL HIGH (ref 6–20)
CHLORIDE: 108 mmol/L (ref 101–111)
CO2: 27 mmol/L (ref 22–32)
Calcium: 9.5 mg/dL (ref 8.9–10.3)
Creatinine, Ser: 0.71 mg/dL (ref 0.44–1.00)
GFR calc Af Amer: >60 mL/min (ref >60)
GFR calc non Af Amer: >60 mL/min (ref >60)
Glucose, Bld: 98 mg/dL (ref 65–99)
POTASSIUM: 3.7 mmol/L (ref 3.5–5.1)
Sodium: 140 mmol/L (ref 135–145)

## 2017-01-16 LAB — CBG MONITORING, ED: Glucose-Capillary: 103 mg/dL — ABNORMAL HIGH (ref 65–99)

## 2017-01-16 NOTE — ED Notes (Signed)
Pt unable to stand to finish orthostatic VS.

## 2017-01-16 NOTE — ED Notes (Signed)
[facility] called for transport. Report given to Alanda Amass at [facility].

## 2017-01-16 NOTE — Discharge Instructions (Signed)
You were seen today for several falls. You have no evidence of broken bones or head injury. All your lab work is reassuring. Follow-up with your primary physician.

## 2017-01-16 NOTE — ED Provider Notes (Signed)
[facility]-EMERGENCY DEPT Provider Note   CSN: [id number] Arrival date & time: 01/16/17  0053  By signing my name below, I, Elder Negus, attest that this documentation has been prepared under the direction and in the presence of Shon Baton, MD. Electronically Signed: Elder Negus, Scribe. 01/16/17. 1:17 AM.   History   Chief Complaint Chief Complaint  Patient presents with  . Fall    HPI Jane [name] is a 83 y.o. female with history of dementia, HTN, and osteoarthritis who presents to the ED following a fall from nursing facility. This patient states that this evening she had 3 separate unwitnessed falls. When asked about why she fell, she states at one point "both her eyes went black" and she fell. However she denies any sycnope; "I have passed out before and it wasn't like that". She is wheelchair-bound and does not ambulate independently. She is currently reporting L breast pain, "lower back spine pain", posterior neck pain, and L knee pain which all resulted from her fall. Pain is 10/10 in severity. She denies any head pain. No chest pain or dyspnea.   The history is provided by the patient. No language interpreter was used.  Fall  This is a new problem. The current episode started 1 to 2 hours ago. The problem has not changed since onset.Pertinent negatives include no chest pain, no abdominal pain and no shortness of breath. Associated symptoms comments: Multiple musculoskeletal complaints. . She has tried nothing for the symptoms.    Past Medical History:  Diagnosis Date  . Abdominal pain, right lower quadrant   . Abdominal pain, right upper quadrant   . Abnormal involuntary movements(781.0)   . Abnormality of gait   . Anal fissure   . Anemia, unspecified   . Anxiety state, unspecified   . CAP (community acquired pneumonia) 01/31/2014  . Chest pain, unspecified   . Closed fracture of maxillary sinus, left 06/18/2015  . Closed left hip fracture (HCC) 06/25/2014  .  Emphysema of lung (HCC)   . Essential and other specified forms of tremor   . Fracture of orbital floor, left, closed 06/18/2015  . Gastritis 02/13/2013  . Glaucoma    both eyes  . Humerus fracture   . Hyperlipidemia   . Hypertension   . Hyposmolality and/or hyponatremia   . Impacted cerumen   . Intestinal or peritoneal adhesions with obstruction (postoperative) (postinfection)   . Laceration of left ear canal 04/13/2014  . Long term (current) use of anticoagulants   . Loss of weight   . Lumbago   . Macular degeneration (senile) of retina, unspecified   . Major depressive disorder, single episode, unspecified   . Osteoarthrosis, unspecified whether generalized or localized, unspecified site   . Osteoporosis, senile   . Other and unspecified hyperlipidemia   . Other pulmonary embolism and infarction   . Other vitamin B12 deficiency anemia   . Pain in limb   . Postoperative anemia due to acute blood loss 01/31/2014  . Unspecified cataract   . Unspecified essential hypertension   . Unspecified glaucoma(365.9)   . Unspecified hearing loss   . Unspecified hypothyroidism   . Vitamin B 12 deficiency 06/19/2015    Patient Active Problem List   Diagnosis Date Noted  . Hernia of abdominal cavity 02/21/2016  . Primary osteoarthritis of right knee 02/21/2016  . Peripheral neuropathy (HCC) 02/21/2016  . Severe auditory hallucinations 02/21/2016  . Cerumen impaction 02/21/2016  . Vitamin B 12 deficiency 06/19/2015  . Fracture of  orbital floor, left, closed 06/18/2015  . Closed fracture of maxillary sinus, left 06/18/2015  . Humeral fracture 06/16/2015  . Dementia 06/16/2015  . Fall at home 06/16/2015  . Humerus fracture   . Near syncope 02/27/2015  . Hyperlipidemia 02/27/2015  . Acute delirium 02/27/2015  . Vaginal yeast infection 02/27/2015  . Enterococcus UTI 02/27/2015  . Vertigo   . Hypothyroidism due to acquired atrophy of thyroid 06/11/2014  . Hair loss 06/11/2014  .  Protein-calorie malnutrition, severe (HCC) 06/11/2014  . Chronic obstructive airway disease with asthma (HCC) 06/11/2014  . Vaginal irritation 06/11/2014  . Localized skin mass, lump, or swelling 06/11/2014  . Psychosis 06/04/2013  . Senile osteoporosis 02/13/2013  . Depression 02/13/2013  . Osteoarthritis of right hip 02/13/2013  . Essential hypertension, benign 02/13/2013  . Anemia, iron deficiency 02/13/2013  . Hypothyroidism 02/13/2013  . Hiatal hernia 02/13/2013    Past Surgical History:  Procedure Laterality Date  . ABDOMINAL HYSTERECTOMY    . BACK SURGERY  2003  . BLADDER SUSPENSION  1993  . EXCISIONAL HEMORRHOIDECTOMY  1980  . INTRAMEDULLARY (IM) NAIL INTERTROCHANTERIC Right 01/27/2014   Procedure: INTRAMEDULLARY (IM) NAIL INTERTROCHANTRIC;  Surgeon: Sheral Apley, MD;  Location: [facility] OR;  Service: Orthopedics;  Laterality: Right;  . INTRAMEDULLARY (IM) NAIL INTERTROCHANTERIC Left 06/26/2014   Procedure: LEFT HIP INTRAMEDULLARY (IM) NAIL;  Surgeon: Cheral Almas, MD;  Location: [facility] OR;  Service: Orthopedics;  Laterality: Left;  . TRIGGER FINGER RELEASE  2009   Dr Teressa Senter    OB History    No data available       Home Medications    Prior to Admission medications   Medication Sig Start Date End Date Taking? Authorizing Provider  acetaminophen (TYLENOL) 325 MG tablet Take 1 tablet (325 mg total) by mouth 3 (three) times daily. 02/07/16  Yes Tiffany L Reed, DO  amLODipine (NORVASC) 5 MG tablet TAKE 1 TABLET BY MOUTH DAILY 11/09/16  Yes Tiffany L Reed, DO  calcium carbonate (TUMS EX) 750 MG chewable tablet Chew 1 tablet by mouth daily.   Yes Historical Provider, MD  calcium-vitamin D (OSCAL WITH D) 500-200 MG-UNIT per tablet Take 1 tablet by mouth daily.   Yes Historical Provider, MD  Cholecalciferol (VITAMIN D) 2000 units tablet Take one tablet daily by mouth for Vitamin D 11/25/15  Yes Tiffany L Reed, DO  fexofenadine (ALLEGRA) 180 MG tablet Take 180 mg by mouth daily.     Yes Historical Provider, MD  HYDROcodone-acetaminophen (NORCO/VICODIN) 5-325 MG tablet Take one tablet by mouth twice daily for pain 01/03/17  Yes Kirt Boys, DO  latanoprost (XALATAN) 0.005 % ophthalmic solution Place 1 drop into both eyes at bedtime. 09/14/16  Yes Tiffany L Reed, DO  levothyroxine (SYNTHROID, LEVOTHROID) 75 MCG tablet TAKE 1 TABLET BY MOUTH EVERY DAY BEFORE BREAKFAST 09/15/16  Yes Tiffany L Reed, DO  LORazepam (ATIVAN) 0.5 MG tablet Take 1 tablet (0.5 mg total) by mouth at bedtime. 09/14/16  Yes Tiffany L Reed, DO  lovastatin (MEVACOR) 20 MG tablet Take 1 tablet (20 mg total) by mouth every evening. 06/08/16  Yes Tiffany L Reed, DO  neomycin-bacitracin-polymyxin (NEOSPORIN) [phone] ointment Apply 1 application topically daily as needed (to right wrist and elbow for scrape).   Yes Historical Provider, MD  polyethylene glycol powder (GLYCOLAX/MIRALAX) powder DISSOLVE 17 GRAMS IN LIQUID AND DRINK DAILY 03/13/16  Yes Tiffany L Reed, DO  QUEtiapine (SEROQUEL) 50 MG tablet TAKE 1 TABLET BY MOUTH EVERY MORNING AND AT BEDTIME.  Patient taking differently: Take one tablet by mouth twice daily. 11/10/16  Yes Tiffany L Reed, DO  sennosides-docusate sodium (SENOKOT-S) 8.6-50 MG tablet Take 1 tablet by mouth at bedtime.   Yes Historical Provider, MD  sertraline (ZOLOFT) 100 MG tablet TAKE 1 TABLET BY MOUTH ONCE DAILY 11/09/16  Yes Tiffany L Reed, DO  Skin Protectants, Misc. (EUCERIN) cream Apply 1 application topically daily.   Yes Historical Provider, MD    Family History Family History  Problem Relation Age of Onset  . Congestive Heart Failure Mother   . Macular degeneration Mother   . Heart disease Mother   . Cancer Sister     lung cancer  . Arthritis Daughter   . Heart disease Daughter   . Glaucoma Sister   . Cataracts Sister     Social History Social History  Substance Use Topics  . Smoking status: Former Smoker    Years: 40.00    Types: Cigarettes  . Smokeless tobacco: Never  Used  . Alcohol use No     Allergies   Lyrica [pregabalin]; Celebrex [celecoxib]; Chlorzoxazone; and Fentanyl   Review of Systems Review of Systems  Respiratory: Negative for shortness of breath.   Cardiovascular: Negative for chest pain.  Gastrointestinal: Negative for abdominal pain.  Musculoskeletal:       Multiple pain complaints s/p fall per HPI  Neurological: Negative for dizziness and syncope.  All other systems reviewed and are negative.    Physical Exam Updated Vital Signs BP 184/71   Pulse 101   Temp 97.7 F (36.5 C) (Oral)   Resp 18   SpO2 92%   Physical Exam  Constitutional:  Elderly, nontoxic, no acute distress  HENT:  Head: Normocephalic and atraumatic.  Mouth/Throat: Oropharynx is clear and moist.  Eyes: EOM are normal. Pupils are equal, round, and reactive to light.  Neck: Normal range of motion. Neck supple.  No midline C-spine tenderness to palpation  Cardiovascular: Normal rate, regular rhythm and normal heart sounds.   Pulmonary/Chest: Effort normal and breath sounds normal. No respiratory distress. She has no wheezes. She exhibits tenderness.  Left chest wall tenderness palpation, no crepitus  Abdominal: Soft. Bowel sounds are normal. There is no tenderness. There is no guarding.  Musculoskeletal:  Normal range of motion of the bilateral hips and knees, no obvious deformities, no tenderness to palpation, there is tenderness to palpation over the lower lumbar spine without step off or deformity  Neurological: She is alert.  Oriented 2, cranial nerves II through XII intact, fluent speech, 5 out of 5 strength in the bilateral upper extremities, 4+ out of 5 strength bilateral lower extremities, no clonus  Skin: Skin is warm and dry.  Psychiatric: She has a normal mood and affect.  Nursing note and vitals reviewed.    ED Treatments / Results  Labs (all labs ordered are listed, but only abnormal results are displayed) Labs Reviewed  CBC WITH  DIFFERENTIAL/PLATELET - Abnormal; Notable for the following:       Result Value   RBC 3.66 (*)    Hemoglobin 11.8 (*)    HCT 34.2 (*)    All other components within normal limits  BASIC METABOLIC PANEL - Abnormal; Notable for the following:    BUN 21 (*)    All other components within normal limits  URINALYSIS, ROUTINE W REFLEX MICROSCOPIC - Abnormal; Notable for the following:    Leukocytes, UA TRACE (*)    Squamous Epithelial / LPF 0-5 (*)  All other components within normal limits  CBG MONITORING, ED - Abnormal; Notable for the following:    Glucose-Capillary 103 (*)    All other components within normal limits    EKG  EKG Interpretation None      ED ECG REPORT   Date: 01/16/2017  Rate: 68  Rhythm: normal sinus rhythm  QRS Axis: normal  Intervals: normal  ST/T Wave abnormalities: normal  Conduction Disutrbances:LVH  Narrative Interpretation:   Old EKG Reviewed: none available  I have personally reviewed the EKG tracing and agree with the computerized printout as noted.   Radiology Dg Chest 2 View  Result Date: 01/16/2017 CLINICAL DATA:  Multiple falls January 15, 2017, back pain. Shortness of breath. EXAM: CHEST  2 VIEW COMPARISON:  Chest radiograph June 16, 2015 FINDINGS: The cardiac silhouette is mildly enlarged, unchanged. Calcified aortic knob. Hyperinflation with bibasilar strandy densities. No pleural effusion or focal consolidation. No pneumothorax. Osteopenia. Old LEFT humerus fracture. IMPRESSION: COPD with bibasilar atelectasis/ scarring. Mild cardiomegaly. Electronically Signed   By: Awilda Metro M.D.   On: 01/16/2017 02:02   Dg Lumbar Spine Complete  Result Date: 01/16/2017 CLINICAL DATA:  Multiple falls January 15, 2017, back pain. Shortness of breath. EXAM: LUMBAR SPINE - COMPLETE 4+ VIEW COMPARISON:  Lumbar spine radiographs May 20, 2015 FINDINGS: Stable broad thoracolumbar levoscoliosis. Lumbar vertebral bodies are intact and aligned with  maintenance of lumbar lordosis. Osteopenia. Severe L5-S1 degenerative disc is unchanged. Mild L3-4 and L4-5 degenerative disc. Severe lower lumbar facet arthropathy. Osteopenia without destructive bony lesions bilateral femoral neck pinning. Severe aortoiliac calcific atherosclerosis. IMPRESSION: Stable examination:  No acute fracture deformity or malalignment. Electronically Signed   By: Awilda Metro M.D.   On: 01/16/2017 02:04   Ct Head Wo Contrast  Result Date: 01/16/2017 CLINICAL DATA:  Three unwitnessed falls over 24 hours. Syncopal episodes. Posterior neck pain. History of hypertension, dementia. EXAM: CT HEAD WITHOUT CONTRAST CT CERVICAL SPINE WITHOUT CONTRAST TECHNIQUE: Multidetector CT imaging of the head and cervical spine was performed following the standard protocol without intravenous contrast. Multiplanar CT image reconstructions of the cervical spine were also generated. COMPARISON:  03/07/2016 FINDINGS: CT HEAD FINDINGS Brain: Diffuse cerebral atrophy. Ventricular dilatation consistent with central atrophy. Low-attenuation changes in the deep white matter consistent with small vessel ischemia. No mass effect or midline shift. No abnormal extra-axial fluid collections. Gray-white matter junctions are distinct. Basal cisterns are not effaced. No acute intracranial hemorrhage. Vascular: Calcified and dilated intracranial carotid arteries. Skull: Calvarium appears intact. Sinuses/Orbits: No acute finding. Other: No significant changes since prior study. CT CERVICAL SPINE FINDINGS Alignment: Normal. Skull base and vertebrae: No acute fracture. No primary bone lesion or focal pathologic process. Soft tissues and spinal canal: No prevertebral fluid or swelling. No visible canal hematoma. Disc levels: Diffuse degenerative change throughout the cervical spine with narrowed interspaces and endplate hypertrophic changes. Degenerative changes throughout the facet joints. Upper chest: Prominent  emphysematous changes in the lung apices. Other: Vascular calcifications in the carotid arteries. IMPRESSION: No acute intracranial abnormalities. Chronic atrophy and small vessel ischemic changes. Normal alignment of the cervical spine. Degenerative changes throughout the cervical spine. No acute displaced fractures identified. Electronically Signed   By: Burman Nieves M.D.   On: 01/16/2017 03:52   Ct Cervical Spine Wo Contrast  Result Date: 01/16/2017 CLINICAL DATA:  Three unwitnessed falls over 24 hours. Syncopal episodes. Posterior neck pain. History of hypertension, dementia. EXAM: CT HEAD WITHOUT CONTRAST CT CERVICAL SPINE WITHOUT CONTRAST TECHNIQUE: Multidetector  CT imaging of the head and cervical spine was performed following the standard protocol without intravenous contrast. Multiplanar CT image reconstructions of the cervical spine were also generated. COMPARISON:  03/07/2016 FINDINGS: CT HEAD FINDINGS Brain: Diffuse cerebral atrophy. Ventricular dilatation consistent with central atrophy. Low-attenuation changes in the deep white matter consistent with small vessel ischemia. No mass effect or midline shift. No abnormal extra-axial fluid collections. Gray-white matter junctions are distinct. Basal cisterns are not effaced. No acute intracranial hemorrhage. Vascular: Calcified and dilated intracranial carotid arteries. Skull: Calvarium appears intact. Sinuses/Orbits: No acute finding. Other: No significant changes since prior study. CT CERVICAL SPINE FINDINGS Alignment: Normal. Skull base and vertebrae: No acute fracture. No primary bone lesion or focal pathologic process. Soft tissues and spinal canal: No prevertebral fluid or swelling. No visible canal hematoma. Disc levels: Diffuse degenerative change throughout the cervical spine with narrowed interspaces and endplate hypertrophic changes. Degenerative changes throughout the facet joints. Upper chest: Prominent emphysematous changes in the lung  apices. Other: Vascular calcifications in the carotid arteries. IMPRESSION: No acute intracranial abnormalities. Chronic atrophy and small vessel ischemic changes. Normal alignment of the cervical spine. Degenerative changes throughout the cervical spine. No acute displaced fractures identified. Electronically Signed   By: Burman Nieves M.D.   On: 01/16/2017 03:52    Procedures Procedures (including critical care time)  Medications Ordered in ED Medications - No data to display   Initial Impression / Assessment and Plan / ED Course  I have reviewed the triage vital signs and the nursing notes.  Pertinent labs & imaging results that were available during my care of the patient were reviewed by me and considered in my medical decision making (see chart for details).     Patient presents after reported recurrent falls. She is wheelchair-bound. She is overall nontoxic-appearing on exam. No obvious traumatic injury. CT and x-ray obtained. Given "black out", basic labwork her EKG obtained. This is all reassuring. Patient appears at her baseline. Will discharge back to her living facility.  After history, exam, and medical workup I feel the patient has been appropriately medically screened and is safe for discharge home. Pertinent diagnoses were discussed with the patient. Patient was given return precautions.   Final Clinical Impressions(s) / ED Diagnoses   Final diagnoses:  Fall, initial encounter  Contusion of left front wall of thorax, initial encounter    New Prescriptions New Prescriptions   No medications on file   I personally performed the services described in this documentation, which was scribed in my presence. The recorded information has been reviewed and is accurate.    Shon Baton, MD 01/16/17 [phone]

## 2017-01-16 NOTE — ED Triage Notes (Signed)
Pt was brought in by [facility]  From SN[facility] and was reported to have had 3 un-witness falls today, pt was found in a w/c as per EMS. Per FS pt tumbled out of w/c and reported feeling dizzy. Pt began c/o of right rib pain and warrant a visit to ED for assessment.     CBG 103 O2 Sat 93 RA 74 HR 200/98 BP

## 2017-01-16 NOTE — ED Notes (Signed)
Pt O2 saturation between 87-90% placed pt on 2 LPM of Oxygen via 

## 2017-01-16 NOTE — ED Notes (Signed)
Bed: WA07 Expected date:  Expected time:  Means of arrival:  Comments: 83 yo F/ Fall

## 2017-01-18 ENCOUNTER — Telehealth: Payer: Self-pay

## 2017-01-18 ENCOUNTER — Ambulatory Visit: Payer: Medicare Other | Admitting: Internal Medicine

## 2017-01-18 MED ORDER — AMBULATORY NON FORMULARY MEDICATION: 0 refills | Status: DC

## 2017-01-18 NOTE — Telephone Encounter (Signed)
Ok to send UA c+s, but also must obtain cbc with diff and bmp because urinary tract infections are not the only cause of delirium.  Patient always has confusion and hallucinations and falls often.  Is she eating and drinking well?  Any urinary symptoms?  Any diarrhea, nausea or vomiting?  Any respiratory symptoms?  Increased pain?  Push po fluids.  Has anyone else changed her medications?  Does she have normal vital signs?  Please also notify [name] that this is going on also.

## 2017-01-18 NOTE — Telephone Encounter (Signed)
Alma with [employer] called to request order for urine sample. Patient with increased confusion, hallucinations, and 2 falls within the last 2 weeks.  If order request approved please fax to [facility] [phone]  Please advise

## 2017-01-18 NOTE — Telephone Encounter (Signed)
Agree with xrays of ribs bilaterally and PT if Jane [name] agrees.

## 2017-01-18 NOTE — Telephone Encounter (Signed)
Spoke with [employer]  Questions answered in the order asked 1.) Yes patient eating and drinking 2.) No urinary sx 3.) No N/V/D 4.) No Respiratory concerns 5.) Yes, patient with bilateral rib pain- see ER notes. Aniceto Boss is requesting order for xray 6.) No other provider has changed medications 7.) Vitals within normal limits- to be faxed once rechecked  Alma would like to request an order for PT for strengthening (order pending, if you agree) , please advise    Left detailed message on voicemail for [name] informing her of orders requested and orders given

## 2017-01-18 NOTE — Telephone Encounter (Signed)
I spoke with Jane [name] does not feel like her mother needs more x-rays. Patient had chest xray and lumbar spine complete in the hospital and was told everything is fine.  Jane [name] would like for her mother to have Physical Therapy.

## 2017-01-19 ENCOUNTER — Encounter: Payer: Self-pay | Admitting: Internal Medicine

## 2017-01-19 DIAGNOSIS — Z79899 Other long term (current) drug therapy: Secondary | ICD-10-CM | POA: Diagnosis not present

## 2017-01-23 ENCOUNTER — Emergency Department (HOSPITAL_COMMUNITY): Payer: Medicare Other

## 2017-01-23 ENCOUNTER — Emergency Department (HOSPITAL_COMMUNITY)
Admission: EM | Admit: 2017-01-23 | Discharge: 2017-01-23 | Disposition: A | Discharge status: Home / Self Care | Payer: Medicare Other | Attending: Emergency Medicine | Admitting: Emergency Medicine

## 2017-01-23 DIAGNOSIS — Y999 Unspecified external cause status: Secondary | ICD-10-CM | POA: Insufficient documentation

## 2017-01-23 DIAGNOSIS — Z79899 Other long term (current) drug therapy: Secondary | ICD-10-CM | POA: Diagnosis not present

## 2017-01-23 DIAGNOSIS — Z87891 Personal history of nicotine dependence: Secondary | ICD-10-CM | POA: Diagnosis not present

## 2017-01-23 DIAGNOSIS — Y939 Activity, unspecified: Secondary | ICD-10-CM | POA: Diagnosis not present

## 2017-01-23 DIAGNOSIS — I10 Essential (primary) hypertension: Secondary | ICD-10-CM | POA: Diagnosis not present

## 2017-01-23 DIAGNOSIS — S2242XA Multiple fractures of ribs, left side, initial encounter for closed fracture: Secondary | ICD-10-CM | POA: Insufficient documentation

## 2017-01-23 DIAGNOSIS — W1830XA Fall on same level, unspecified, initial encounter: Secondary | ICD-10-CM | POA: Diagnosis not present

## 2017-01-23 DIAGNOSIS — Y929 Unspecified place or not applicable: Secondary | ICD-10-CM | POA: Diagnosis not present

## 2017-01-23 DIAGNOSIS — S2232XA Fracture of one rib, left side, initial encounter for closed fracture: Secondary | ICD-10-CM | POA: Diagnosis not present

## 2017-01-23 DIAGNOSIS — S279XXA Injury of unspecified intrathoracic organ, initial encounter: Secondary | ICD-10-CM | POA: Diagnosis not present

## 2017-01-23 DIAGNOSIS — E039 Hypothyroidism, unspecified: Secondary | ICD-10-CM | POA: Insufficient documentation

## 2017-01-23 DIAGNOSIS — S299XXA Unspecified injury of thorax, initial encounter: Secondary | ICD-10-CM | POA: Diagnosis present

## 2017-01-23 DIAGNOSIS — R0781 Pleurodynia: Secondary | ICD-10-CM | POA: Diagnosis not present

## 2017-01-23 LAB — I-STAT CHEM 8, ED
BUN: 24 mg/dL — AB (ref 6–20)
CREATININE: 0.7 mg/dL (ref 0.44–1.00)
Calcium, Ion: 1.2 mmol/L (ref 1.15–1.40)
Chloride: 103 mmol/L (ref 101–111)
GLUCOSE: 104 mg/dL — AB (ref 65–99)
HEMATOCRIT: 33 % — AB (ref 36.0–46.0)
Hemoglobin: 11.2 g/dL — ABNORMAL LOW (ref 12.0–15.0)
Potassium: 3.8 mmol/L (ref 3.5–5.1)
Sodium: 141 mmol/L (ref 135–145)
TCO2: 26 mmol/L (ref 0–100)

## 2017-01-23 LAB — CBC WITH DIFFERENTIAL/PLATELET
BASOS ABS: 0 10*3/uL (ref 0.0–0.1)
BASOS PCT: 0 %
Eosinophils Absolute: 0.1 10*3/uL (ref 0.0–0.7)
Eosinophils Relative: 2 %
HEMATOCRIT: 34.1 % — AB (ref 36.0–46.0)
HEMOGLOBIN: 11.3 g/dL — AB (ref 12.0–15.0)
Lymphocytes Relative: 17 %
Lymphs Abs: 0.9 10*3/uL (ref 0.7–4.0)
MCH: 30.5 pg (ref 26.0–34.0)
MCHC: 33.1 g/dL (ref 30.0–36.0)
MCV: 92.2 fL (ref 78.0–100.0)
Monocytes Absolute: 0.5 10*3/uL (ref 0.1–1.0)
Monocytes Relative: 9 %
NEUTROS ABS: 3.9 10*3/uL (ref 1.7–7.7)
NEUTROS PCT: 72 %
Platelets: 171 10*3/uL (ref 150–400)
RBC: 3.7 MIL/uL — AB (ref 3.87–5.11)
RDW: 13.2 % (ref 11.5–15.5)
WBC: 5.4 10*3/uL (ref 4.0–10.5)

## 2017-01-23 MED ORDER — HYDROCODONE-ACETAMINOPHEN 5-325 MG PO TABS
1.0000 | ORAL_TABLET | Freq: Once | ORAL | Status: AC
  Administered 2017-01-23: 1 via ORAL
  Filled 2017-01-23: qty 1

## 2017-01-23 MED ORDER — HYDROCODONE-ACETAMINOPHEN 5-325 MG PO TABS: 1.0000 | ORAL_TABLET | Freq: Four times a day (QID) | ORAL | 0 refills | Status: DC | PRN

## 2017-01-23 NOTE — ED Notes (Signed)
Pt was on RA while being assissted on steady to restroom. Pt's O2 on return was 84%. Pt went back to 96% on 2L [place]. Pt was given incentive spirometer and shown how to use it.

## 2017-01-23 NOTE — ED Notes (Signed)
Bed: WA08 Expected date:  Expected time:  Means of arrival:  Comments: Held for hall A- need oxygen.

## 2017-01-23 NOTE — ED Triage Notes (Signed)
Per EMS, pt from [facility] complains of lower left ribcage pain since fall 1 week ago. Pt has no bruising to area of pain, pain is reproducible with palpation. 12 lead showed NSR. Pt has hx of dementia and is confused at baseline.   BP 143/81 HR 81 Spo2 90

## 2017-01-23 NOTE — ED Notes (Signed)
Bed: [facility] Expected date:  Expected time:  Means of arrival:  Comments: 83 yo left rib pain after fall

## 2017-01-23 NOTE — ED Provider Notes (Signed)
[facility]-EMERGENCY DEPT Provider Note   CSN: [id number] Arrival date & time: 01/23/17  1233     History   Chief Complaint Chief Complaint  Patient presents with  . Rib cage pain    HPI Jane [name] is a 83 y.o. female.  Patient is an 83 year old female with a history of COPD, hypertension, anemia, multiple falls presenting from nursing facility today for severe left-sided pain. Patient had a fall 7 days ago and since that time has been grabbing the left side of her chest and screaming in pain. Patient was seen in the emergency room directly after the fall. She had fallen 3-4 times that day but was not deemed to have syncope. Unclear what made the patient fall but she is wheelchair-bound. Patient states she has not had any falls since that time but the left side of her chest has continued to hurt. It's 10/10 and significantly painful with movement. She denies any abdominal pain, nausea or vomiting. She does feel short of breath occasionally having was found to be hypoxic at the nursing facility and oxygen was placed. Patient's images from her last emergency room visit 7 days ago were negative head, C-spine and chest x-ray was within normal limits. She currently denies any pain in her legs or arms.   The history is provided by the patient and the nursing home.    Past Medical History:  Diagnosis Date  . Abdominal pain, right lower quadrant   . Abdominal pain, right upper quadrant   . Abnormal involuntary movements(781.0)   . Abnormality of gait   . Anal fissure   . Anemia, unspecified   . Anxiety state, unspecified   . CAP (community acquired pneumonia) 01/31/2014  . Chest pain, unspecified   . Closed fracture of maxillary sinus, left 06/18/2015  . Closed left hip fracture (HCC) 06/25/2014  . Emphysema of lung (HCC)   . Essential and other specified forms of tremor   . Fracture of orbital floor, left, closed 06/18/2015  . Gastritis 02/13/2013  . Glaucoma    both eyes  . Humerus  fracture   . Hyperlipidemia   . Hypertension   . Hyposmolality and/or hyponatremia   . Impacted cerumen   . Intestinal or peritoneal adhesions with obstruction (postoperative) (postinfection)   . Laceration of left ear canal 04/13/2014  . Long term (current) use of anticoagulants   . Loss of weight   . Lumbago   . Macular degeneration (senile) of retina, unspecified   . Major depressive disorder, single episode, unspecified   . Osteoarthrosis, unspecified whether generalized or localized, unspecified site   . Osteoporosis, senile   . Other and unspecified hyperlipidemia   . Other pulmonary embolism and infarction   . Other vitamin B12 deficiency anemia   . Pain in limb   . Postoperative anemia due to acute blood loss 01/31/2014  . Unspecified cataract   . Unspecified essential hypertension   . Unspecified glaucoma(365.9)   . Unspecified hearing loss   . Unspecified hypothyroidism   . Vitamin B 12 deficiency 06/19/2015    Patient Active Problem List   Diagnosis Date Noted  . Hernia of abdominal cavity 02/21/2016  . Primary osteoarthritis of right knee 02/21/2016  . Peripheral neuropathy (HCC) 02/21/2016  . Severe auditory hallucinations 02/21/2016  . Cerumen impaction 02/21/2016  . Vitamin B 12 deficiency 06/19/2015  . Fracture of orbital floor, left, closed 06/18/2015  . Closed fracture of maxillary sinus, left 06/18/2015  . Humeral fracture 06/16/2015  . Dementia  06/16/2015  . Fall at home 06/16/2015  . Humerus fracture   . Near syncope 02/27/2015  . Hyperlipidemia 02/27/2015  . Acute delirium 02/27/2015  . Vaginal yeast infection 02/27/2015  . Enterococcus UTI 02/27/2015  . Vertigo   . Hypothyroidism due to acquired atrophy of thyroid 06/11/2014  . Hair loss 06/11/2014  . Protein-calorie malnutrition, severe (HCC) 06/11/2014  . Chronic obstructive airway disease with asthma (HCC) 06/11/2014  . Vaginal irritation 06/11/2014  . Localized skin mass, lump, or swelling  06/11/2014  . Psychosis 06/04/2013  . Senile osteoporosis 02/13/2013  . Depression 02/13/2013  . Osteoarthritis of right hip 02/13/2013  . Essential hypertension, benign 02/13/2013  . Anemia, iron deficiency 02/13/2013  . Hypothyroidism 02/13/2013  . Hiatal hernia 02/13/2013    Past Surgical History:  Procedure Laterality Date  . ABDOMINAL HYSTERECTOMY    . BACK SURGERY  2003  . BLADDER SUSPENSION  1993  . EXCISIONAL HEMORRHOIDECTOMY  1980  . INTRAMEDULLARY (IM) NAIL INTERTROCHANTERIC Right 01/27/2014   Procedure: INTRAMEDULLARY (IM) NAIL INTERTROCHANTRIC;  Surgeon: Sheral Apley, MD;  Location: [facility] OR;  Service: Orthopedics;  Laterality: Right;  . INTRAMEDULLARY (IM) NAIL INTERTROCHANTERIC Left 06/26/2014   Procedure: LEFT HIP INTRAMEDULLARY (IM) NAIL;  Surgeon: Cheral Almas, MD;  Location: [facility] OR;  Service: Orthopedics;  Laterality: Left;  . TRIGGER FINGER RELEASE  2009   Dr Teressa Senter    OB History    No data available       Home Medications    Prior to Admission medications   Medication Sig Start Date End Date Taking? Authorizing Provider  acetaminophen (TYLENOL) 325 MG tablet Take 1 tablet (325 mg total) by mouth 3 (three) times daily. 02/07/16   Tiffany L Reed, DO  AMBULATORY NON FORMULARY MEDICATION Lab Order; Urinalysis, Culture & Sensitivity, CBCD, BMP DX R41.0 (Delirium) 01/18/17   Tiffany L Reed, DO  AMBULATORY NON FORMULARY MEDICATION Physical Therapy for strengthening, evaluate and treat 01/18/17   Tiffany L Reed, DO  amLODipine (NORVASC) 5 MG tablet TAKE 1 TABLET BY MOUTH DAILY 11/09/16   Tiffany L Reed, DO  calcium carbonate (TUMS EX) 750 MG chewable tablet Chew 1 tablet by mouth daily.    Historical Provider, MD  calcium-vitamin D (OSCAL WITH D) 500-200 MG-UNIT per tablet Take 1 tablet by mouth daily.    Historical Provider, MD  Cholecalciferol (VITAMIN D) 2000 units tablet Take one tablet daily by mouth for Vitamin D 11/25/15   Tiffany L Reed, DO  fexofenadine  (ALLEGRA) 180 MG tablet Take 180 mg by mouth daily.     Historical Provider, MD  HYDROcodone-acetaminophen (NORCO/VICODIN) 5-325 MG tablet Take one tablet by mouth twice daily for pain 01/03/17   Kirt Boys, DO  latanoprost (XALATAN) 0.005 % ophthalmic solution Place 1 drop into both eyes at bedtime. 09/14/16   Tiffany L Reed, DO  levothyroxine (SYNTHROID, LEVOTHROID) 75 MCG tablet TAKE 1 TABLET BY MOUTH EVERY DAY BEFORE BREAKFAST 09/15/16   Tiffany L Reed, DO  LORazepam (ATIVAN) 0.5 MG tablet Take 1 tablet (0.5 mg total) by mouth at bedtime. 09/14/16   Tiffany L Reed, DO  lovastatin (MEVACOR) 20 MG tablet Take 1 tablet (20 mg total) by mouth every evening. 06/08/16   Tiffany L Reed, DO  neomycin-bacitracin-polymyxin (NEOSPORIN) [phone] ointment Apply 1 application topically daily as needed (to right wrist and elbow for scrape).    Historical Provider, MD  polyethylene glycol powder (GLYCOLAX/MIRALAX) powder DISSOLVE 17 GRAMS IN LIQUID AND DRINK DAILY 03/13/16  Tiffany L Reed, DO  QUEtiapine (SEROQUEL) 50 MG tablet TAKE 1 TABLET BY MOUTH EVERY MORNING AND AT BEDTIME. Patient taking differently: Take one tablet by mouth twice daily. 11/10/16   Tiffany L Reed, DO  sennosides-docusate sodium (SENOKOT-S) 8.6-50 MG tablet Take 1 tablet by mouth at bedtime.    Historical Provider, MD  sertraline (ZOLOFT) 100 MG tablet TAKE 1 TABLET BY MOUTH ONCE DAILY 11/09/16   Tiffany L Reed, DO  Skin Protectants, Misc. (EUCERIN) cream Apply 1 application topically daily.    Historical Provider, MD    Family History Family History  Problem Relation Age of Onset  . Congestive Heart Failure Mother   . Macular degeneration Mother   . Heart disease Mother   . Cancer Sister     lung cancer  . Arthritis Daughter   . Heart disease Daughter   . Glaucoma Sister   . Cataracts Sister     Social History Social History  Substance Use Topics  . Smoking status: Former Smoker    Years: 40.00    Types: Cigarettes  .  Smokeless tobacco: Never Used  . Alcohol use No     Allergies   Lyrica [pregabalin]; Celebrex [celecoxib]; Chlorzoxazone; and Fentanyl   Review of Systems Review of Systems  All other systems reviewed and are negative.    Physical Exam Updated Vital Signs Pulse 76   SpO2 (!) 87%   Physical Exam  Constitutional: She is oriented to person, place, and time. She appears well-developed and well-nourished. No distress.  HENT:  Head: Normocephalic and atraumatic.  Mouth/Throat: Oropharynx is clear and moist.  Eyes: Conjunctivae and EOM are normal. Pupils are equal, round, and reactive to light.  Neck: Normal range of motion. Neck supple.  Cardiovascular: Normal rate, regular rhythm and intact distal pulses.   No murmur heard. Pulmonary/Chest: Effort normal and breath sounds normal. No respiratory distress. She has no wheezes. She has no rales. She exhibits tenderness.    Severe pain with palpation but breath sounds equal bilaterally  Abdominal: Soft. She exhibits no distension. There is no tenderness. There is no rebound and no guarding.  Musculoskeletal: Normal range of motion. She exhibits no edema or tenderness.  Able to range bilateral legs without difficulty.  No reproducible pain  Neurological: She is alert and oriented to person, place, and time.  Skin: Skin is warm and dry. No rash noted. No erythema.  Psychiatric: She has a normal mood and affect. Her behavior is normal.  Nursing note and vitals reviewed.    ED Treatments / Results  Labs (all labs ordered are listed, but only abnormal results are displayed) Labs Reviewed  CBC WITH DIFFERENTIAL/PLATELET - Abnormal; Notable for the following:       Result Value   RBC 3.70 (*)    Hemoglobin 11.3 (*)    HCT 34.1 (*)    All other components within normal limits  I-STAT CHEM 8, ED - Abnormal; Notable for the following:    BUN 24 (*)    Glucose, Bld 104 (*)    Hemoglobin 11.2 (*)    HCT 33.0 (*)    All other  components within normal limits    EKG  EKG Interpretation None       Radiology Dg Ribs Unilateral W/chest Left  Result Date: 01/23/2017 CLINICAL DATA:  Fall 1 week ago and complains of left lower rib pain. EXAM: LEFT RIBS AND CHEST - 3+ VIEW COMPARISON:  01/16/2017 FINDINGS: There is a mildly displaced fracture  involving the lateral left seventh rib. Question a subtle fracture in left eighth rib. Heart size is mildly enlarged but stable. Negative for pneumothorax. Query trace left pleural fluid. Few densities at the left lung base may represent atelectasis. Old fracture involving the left humerus. IMPRESSION: Fracture of the left seventh rib. Question a fracture in left eighth rib. Negative for pneumothorax. Left basilar atelectasis and question trace left pleural fluid. Electronically Signed   By: Richarda Overlie M.D.   On: 01/23/2017 13:33    Procedures Procedures (including critical care time)  Medications Ordered in ED Medications  HYDROcodone-acetaminophen (NORCO/VICODIN) 5-325 MG per tablet 1 tablet (not administered)     Initial Impression / Assessment and Plan / ED Course  I have reviewed the triage vital signs and the nursing notes.  Pertinent labs & imaging results that were available during my care of the patient were reviewed by me and considered in my medical decision making (see chart for details).     Patient is an 83 year old female presenting today with persistent left-sided chest pain. This started after multiple falls 7 days ago and has not improved.  Patient was found to be hypoxic today in the emergency room with oxygen saturation between 82 and 86%. She has significant tenderness with taking a deep breath or movement. She has no abdominal pain or signs of trauma. Concern for possible rib fractures versus pneumothorax versus developing pneumonia. She denies any new cough or fever. She was placed on oxygen. Repeat imaging pending and patient given pain  control.  2:11 PM X-rays consistent with left-sided seventh and eighth rib fractures. This would explain her pain. Incentive spirometry ordered as well as pain control. We'll attempt to wean patient off oxygen however she continues to be hypoxic she will need admission for hypoxia to receive oxygen prior to going back to the nursing facility.  4:26 PM Patient is more pain controlled after the Vicodin. Oxygen was removed and oxygen saturations remained greater than 90%. She was also given an incentive spirometer. All this was discussed with the patient's daughter. Patient already takes 2 stool softeners at the facility to prevent constipation. She has a follow-up with Dr. Renato Gails on Thursday.    5:19 PM Patient's oxygen between 89 and 92%. Patient did drop to 85 once but with a deep breath was back in the 90s. The facility will check her oxygen level twice a day and they will discuss it further with Dr. Renato Gails on Thursday.  Final Clinical Impressions(s) / ED Diagnoses   Final diagnoses:  Closed fracture of multiple ribs of left side, initial encounter    New Prescriptions New Prescriptions   No medications on file     Gwyneth Sprout, MD 01/23/17 1719

## 2017-01-23 NOTE — Discharge Instructions (Signed)
Need to use incentive spirometer 2-3 times an hour while awake.  Need to follow up with Dr. Renato Gails on Thursday and see if you need oxygen.  Facility needs to check oxygen twice a day.  Can increase hydrocodone to every 6 hours for the next 2 weeks

## 2017-01-25 ENCOUNTER — Encounter: Payer: Self-pay | Admitting: Internal Medicine

## 2017-01-25 ENCOUNTER — Ambulatory Visit (INDEPENDENT_AMBULATORY_CARE_PROVIDER_SITE_OTHER): Payer: Medicare Other | Admitting: Internal Medicine

## 2017-01-25 VITALS — BP 124/68 | HR 78 | Temp 97.5°F | Ht 61.0 in | Wt 119.6 lb

## 2017-01-25 DIAGNOSIS — M810 Age-related osteoporosis without current pathological fracture: Secondary | ICD-10-CM | POA: Diagnosis not present

## 2017-01-25 DIAGNOSIS — R296 Repeated falls: Secondary | ICD-10-CM

## 2017-01-25 DIAGNOSIS — F0151 Vascular dementia with behavioral disturbance: Secondary | ICD-10-CM | POA: Diagnosis not present

## 2017-01-25 DIAGNOSIS — S2242XA Multiple fractures of ribs, left side, initial encounter for closed fracture: Secondary | ICD-10-CM

## 2017-01-25 DIAGNOSIS — R1310 Dysphagia, unspecified: Secondary | ICD-10-CM | POA: Diagnosis not present

## 2017-01-25 DIAGNOSIS — E034 Atrophy of thyroid (acquired): Secondary | ICD-10-CM | POA: Diagnosis not present

## 2017-01-25 DIAGNOSIS — F01518 Vascular dementia, unspecified severity, with other behavioral disturbance: Secondary | ICD-10-CM

## 2017-01-25 DIAGNOSIS — K5904 Chronic idiopathic constipation: Secondary | ICD-10-CM

## 2017-01-25 LAB — TSH: TSH: 4.94 mIU/L — ABNORMAL HIGH

## 2017-01-25 NOTE — Progress Notes (Signed)
Location:  [facility] clinic Provider:  Vinnie Gombert L. Renato Gails, D.O., C.M.D.  Code Status: DNR Goals of Care:  Advanced Directives 01/25/2017  Does Patient Have a Medical Advance Directive? Yes  Type of Advance Directive Out of facility DNR (pink MOST or yellow form)  Does patient want to make changes to medical advance directive? -  Copy of Healthcare Power of Attorney in Chart? -  Would patient like information on creating a medical advance directive? -  Pre-existing out of facility DNR order (yellow form or pink MOST form) Pink MOST form placed in chart (order not valid for inpatient use);Yellow form placed in chart (order not valid for inpatient use)  discussed that we will review her most at her annual visit  Chief Complaint  Patient presents with  . Medical Management of Chronic Issues    3 months med management    HPI: Patient is a 83 y.o. female seen today for medical management of chronic diseases.    Pt was doing great until 3/12 with her first fall.  This am's fall was from bed and not out of wheelchair (across the room).  Her daughter wonders if pain from the rib fxs is limiting the distance she can ambulate w/o falling.  Reviewed her daughter's tracking of her falls--5 falls since 01/15/17.    She says her eyes change when she is about to fall where she cannot see.  She will reach out to grab something to hold onto and fall down.    She is meant to have PT starting 3/23 which I agree with her having.    Staff were requesting an xray of her left shoulder b/c of her latest complaint of pain when moving her left shoulder.    Past Medical History:  Diagnosis Date  . Abdominal pain, right lower quadrant   . Abdominal pain, right upper quadrant   . Abnormal involuntary movements(781.0)   . Abnormality of gait   . Anal fissure   . Anemia, unspecified   . Anxiety state, unspecified   . CAP (community acquired pneumonia) 01/31/2014  . Chest pain, unspecified   . Closed fracture of  maxillary sinus, left 06/18/2015  . Closed left hip fracture (HCC) 06/25/2014  . Emphysema of lung (HCC)   . Essential and other specified forms of tremor   . Fracture of orbital floor, left, closed 06/18/2015  . Gastritis 02/13/2013  . Glaucoma    both eyes  . Humerus fracture   . Hyperlipidemia   . Hypertension   . Hyposmolality and/or hyponatremia   . Impacted cerumen   . Intestinal or peritoneal adhesions with obstruction (postoperative) (postinfection)   . Laceration of left ear canal 04/13/2014  . Long term (current) use of anticoagulants   . Loss of weight   . Lumbago   . Macular degeneration (senile) of retina, unspecified   . Major depressive disorder, single episode, unspecified   . Osteoarthrosis, unspecified whether generalized or localized, unspecified site   . Osteoporosis, senile   . Other and unspecified hyperlipidemia   . Other pulmonary embolism and infarction   . Other vitamin B12 deficiency anemia   . Pain in limb   . Postoperative anemia due to acute blood loss 01/31/2014  . Unspecified cataract   . Unspecified essential hypertension   . Unspecified glaucoma(365.9)   . Unspecified hearing loss   . Unspecified hypothyroidism   . Vitamin B 12 deficiency 06/19/2015    Past Surgical History:  Procedure Laterality Date  .  ABDOMINAL HYSTERECTOMY    . BACK SURGERY  2003  . BLADDER SUSPENSION  1993  . EXCISIONAL HEMORRHOIDECTOMY  1980  . INTRAMEDULLARY (IM) NAIL INTERTROCHANTERIC Right 01/27/2014   Procedure: INTRAMEDULLARY (IM) NAIL INTERTROCHANTRIC;  Surgeon: Sheral Apley, MD;  Location: [facility] OR;  Service: Orthopedics;  Laterality: Right;  . INTRAMEDULLARY (IM) NAIL INTERTROCHANTERIC Left 06/26/2014   Procedure: LEFT HIP INTRAMEDULLARY (IM) NAIL;  Surgeon: Cheral Almas, MD;  Location: [facility] OR;  Service: Orthopedics;  Laterality: Left;  . TRIGGER FINGER RELEASE  2009   Dr Teressa Senter    Allergies  Allergen Reactions  . Lyrica [Pregabalin] Other (See Comments)      Auditory hallucinations  . Celebrex [Celecoxib]     unknown  . Chlorzoxazone     unknown  . Fentanyl Anxiety    Allergies as of 01/25/2017      Reactions   Lyrica [pregabalin] Other (See Comments)   Auditory hallucinations   Celebrex [celecoxib]    unknown   Chlorzoxazone    unknown   Fentanyl Anxiety      Medication List       Accurate as of 01/25/17  2:42 PM. Always use your most recent med list.          acetaminophen 325 MG tablet Commonly known as:  TYLENOL Take 650 mg by mouth every 6 (six) hours as needed for moderate pain.   acetaminophen 325 MG tablet Commonly known as:  TYLENOL Take 1 tablet (325 mg total) by mouth 3 (three) times daily.   AMBULATORY NON FORMULARY MEDICATION Lab Order; Urinalysis, Culture & Sensitivity, CBCD, BMP DX R41.0 (Delirium)   AMBULATORY NON FORMULARY MEDICATION Physical Therapy for strengthening, evaluate and treat   amLODipine 5 MG tablet Commonly known as:  NORVASC TAKE 1 TABLET BY MOUTH DAILY   calcium carbonate 750 MG chewable tablet Commonly known as:  TUMS EX Chew 1 tablet by mouth daily.   calcium-vitamin D 500-200 MG-UNIT tablet Commonly known as:  OSCAL WITH D Take 1 tablet by mouth daily.   eucerin cream Apply 1 application topically daily.   fexofenadine 180 MG tablet Commonly known as:  ALLEGRA Take 180 mg by mouth daily.   latanoprost 0.005 % ophthalmic solution Commonly known as:  XALATAN Place 1 drop into both eyes at bedtime.   levothyroxine 75 MCG tablet Commonly known as:  SYNTHROID, LEVOTHROID TAKE 1 TABLET BY MOUTH EVERY DAY BEFORE BREAKFAST   LORazepam 0.5 MG tablet Commonly known as:  ATIVAN Take 1 tablet (0.5 mg total) by mouth at bedtime.   lovastatin 20 MG tablet Commonly known as:  MEVACOR Take 1 tablet (20 mg total) by mouth every evening.   neomycin-bacitracin-polymyxin [phone] ointment Apply 1 application topically daily as needed (to right wrist and elbow for scrape).    polyethylene glycol powder powder Commonly known as:  GLYCOLAX/MIRALAX DISSOLVE 17 GRAMS IN LIQUID AND DRINK DAILY   QUEtiapine 50 MG tablet Commonly known as:  SEROQUEL TAKE 1 TABLET BY MOUTH EVERY MORNING AND AT BEDTIME.   sennosides-docusate sodium 8.6-50 MG tablet Commonly known as:  SENOKOT-S Take 1 tablet by mouth at bedtime.   sertraline 100 MG tablet Commonly known as:  ZOLOFT TAKE 1 TABLET BY MOUTH ONCE DAILY   Vitamin D 2000 units tablet Take one tablet daily by mouth for Vitamin D       Review of Systems:  Review of Systems  Constitutional: Negative for chills, fever and malaise/fatigue.  HENT: Positive for hearing loss.  Eyes: Positive for blurred vision.  Respiratory: Negative for cough and shortness of breath.   Cardiovascular: Negative for chest pain, palpitations and leg swelling.  Gastrointestinal: Positive for constipation. Negative for abdominal pain, blood in stool, diarrhea and melena.       Having some loose stools at times after the miralax and sounds like she is still getting it at [facility] even then  Genitourinary: Negative for dysuria.  Musculoskeletal: Positive for falls. Negative for back pain, myalgias and neck pain.       Pain in left lower anterior ribs  Skin: Negative for itching and rash.  Neurological: Negative for dizziness, loss of consciousness and weakness.  Endo/Heme/Allergies: Bruises/bleeds easily.       Bruise right antecubital fossa from bloodwork, left upper arm from fall with rib injury  Psychiatric/Behavioral: Positive for hallucinations and memory loss. Negative for depression. The patient is not nervous/anxious and does not have insomnia.     Health Maintenance  Topic Date Due  . TETANUS/TDAP  12/17/2021  . INFLUENZA VACCINE  Completed  . DEXA SCAN  Completed  . PNA vac Low Risk Adult  Completed    Physical Exam: Vitals:   01/25/17 1431  BP: 124/68  Pulse: 78  Temp: 97.5 F (36.4 C)  TempSrc: Oral  SpO2:  94%  Weight: 119 lb 9.6 oz (54.3 kg)  Height: 5\' 1"  (1.549 m)   Body mass index is 22.6 kg/m. Physical Exam  Constitutional: She appears well-developed and well-nourished. No distress.  Cardiovascular: Normal rate, regular rhythm, normal heart sounds and intact distal pulses.   Pulmonary/Chest: Effort normal and breath sounds normal. No respiratory distress.  Abdominal: Bowel sounds are normal.  Musculoskeletal: Normal range of motion. She exhibits tenderness.  No tenderness of left shoulder, back; only tender over left lower ribs beneath breast and above abdomen  Neurological: She is alert.  Oriented to person and general but not precise place  Skin: Skin is warm and dry. Capillary refill takes less than 2 seconds.  Two ecchymoses as above  Psychiatric: She has a normal mood and affect.    Labs reviewed: Basic Metabolic Panel:  Recent Labs  96/04/54 1415 01/16/17 0151 01/23/17 1343  NA 141 140 141  K 4.3 3.7 3.8  CL 104 108 103  CO2 28 27  --   GLUCOSE 86 98 104*  BUN 19 21* 24*  CREATININE 0.74 0.71 0.70  CALCIUM 9.7 9.5  --   TSH 0.10*  --   --    Liver Function Tests:  Recent Labs  09/14/16 1415  AST 21  ALT 12  ALKPHOS 50  BILITOT 0.5  PROT 6.7  ALBUMIN 4.5   No results for input(s): LIPASE, AMYLASE in the last 8760 hours. No results for input(s): AMMONIA in the last 8760 hours. CBC:  Recent Labs  09/14/16 1415 01/16/17 0151 01/23/17 1333 01/23/17 1343  WBC 6.7 5.6 5.4  --   NEUTROABS 4,556 3.6 3.9  --   HGB 12.4 11.8* 11.3* 11.2*  HCT 38.1 34.2* 34.1* 33.0*  MCV 95.0 93.4 92.2  --   PLT 202 181 171  --    Lipid Panel: No results for input(s): CHOL, HDL, LDLCALC, TRIG, CHOLHDL, LDLDIRECT in the last 8760 hours. Lab Results  Component Value Date   HGBA1C 5.5 02/27/2015    Procedures since last visit: Dg Chest 2 View  Result Date: 01/16/2017 CLINICAL DATA:  Multiple falls January 15, 2017, back pain. Shortness of breath. EXAM: CHEST  2  VIEW COMPARISON:  Chest radiograph June 16, 2015 FINDINGS: The cardiac silhouette is mildly enlarged, unchanged. Calcified aortic knob. Hyperinflation with bibasilar strandy densities. No pleural effusion or focal consolidation. No pneumothorax. Osteopenia. Old LEFT humerus fracture. IMPRESSION: COPD with bibasilar atelectasis/ scarring. Mild cardiomegaly. Electronically Signed   By: Awilda Metro M.D.   On: 01/16/2017 02:02   Dg Ribs Unilateral W/chest Left  Result Date: 01/23/2017 CLINICAL DATA:  Fall 1 week ago and complains of left lower rib pain. EXAM: LEFT RIBS AND CHEST - 3+ VIEW COMPARISON:  01/16/2017 FINDINGS: There is a mildly displaced fracture involving the lateral left seventh rib. Question a subtle fracture in left eighth rib. Heart size is mildly enlarged but stable. Negative for pneumothorax. Query trace left pleural fluid. Few densities at the left lung base may represent atelectasis. Old fracture involving the left humerus. IMPRESSION: Fracture of the left seventh rib. Question a fracture in left eighth rib. Negative for pneumothorax. Left basilar atelectasis and question trace left pleural fluid. Electronically Signed   By: Richarda Overlie M.D.   On: 01/23/2017 13:33   Dg Lumbar Spine Complete  Result Date: 01/16/2017 CLINICAL DATA:  Multiple falls January 15, 2017, back pain. Shortness of breath. EXAM: LUMBAR SPINE - COMPLETE 4+ VIEW COMPARISON:  Lumbar spine radiographs May 20, 2015 FINDINGS: Stable broad thoracolumbar levoscoliosis. Lumbar vertebral bodies are intact and aligned with maintenance of lumbar lordosis. Osteopenia. Severe L5-S1 degenerative disc is unchanged. Mild L3-4 and L4-5 degenerative disc. Severe lower lumbar facet arthropathy. Osteopenia without destructive bony lesions bilateral femoral neck pinning. Severe aortoiliac calcific atherosclerosis. IMPRESSION: Stable examination:  No acute fracture deformity or malalignment. Electronically Signed   By: Awilda Metro  M.D.   On: 01/16/2017 02:04   Ct Head Wo Contrast  Result Date: 01/16/2017 CLINICAL DATA:  Three unwitnessed falls over 24 hours. Syncopal episodes. Posterior neck pain. History of hypertension, dementia. EXAM: CT HEAD WITHOUT CONTRAST CT CERVICAL SPINE WITHOUT CONTRAST TECHNIQUE: Multidetector CT imaging of the head and cervical spine was performed following the standard protocol without intravenous contrast. Multiplanar CT image reconstructions of the cervical spine were also generated. COMPARISON:  03/07/2016 FINDINGS: CT HEAD FINDINGS Brain: Diffuse cerebral atrophy. Ventricular dilatation consistent with central atrophy. Low-attenuation changes in the deep white matter consistent with small vessel ischemia. No mass effect or midline shift. No abnormal extra-axial fluid collections. Gray-white matter junctions are distinct. Basal cisterns are not effaced. No acute intracranial hemorrhage. Vascular: Calcified and dilated intracranial carotid arteries. Skull: Calvarium appears intact. Sinuses/Orbits: No acute finding. Other: No significant changes since prior study. CT CERVICAL SPINE FINDINGS Alignment: Normal. Skull base and vertebrae: No acute fracture. No primary bone lesion or focal pathologic process. Soft tissues and spinal canal: No prevertebral fluid or swelling. No visible canal hematoma. Disc levels: Diffuse degenerative change throughout the cervical spine with narrowed interspaces and endplate hypertrophic changes. Degenerative changes throughout the facet joints. Upper chest: Prominent emphysematous changes in the lung apices. Other: Vascular calcifications in the carotid arteries. IMPRESSION: No acute intracranial abnormalities. Chronic atrophy and small vessel ischemic changes. Normal alignment of the cervical spine. Degenerative changes throughout the cervical spine. No acute displaced fractures identified. Electronically Signed   By: Burman Nieves M.D.   On: 01/16/2017 03:52   Ct Cervical  Spine Wo Contrast  Result Date: 01/16/2017 CLINICAL DATA:  Three unwitnessed falls over 24 hours. Syncopal episodes. Posterior neck pain. History of hypertension, dementia. EXAM: CT HEAD WITHOUT CONTRAST CT CERVICAL SPINE WITHOUT CONTRAST  TECHNIQUE: Multidetector CT imaging of the head and cervical spine was performed following the standard protocol without intravenous contrast. Multiplanar CT image reconstructions of the cervical spine were also generated. COMPARISON:  03/07/2016 FINDINGS: CT HEAD FINDINGS Brain: Diffuse cerebral atrophy. Ventricular dilatation consistent with central atrophy. Low-attenuation changes in the deep white matter consistent with small vessel ischemia. No mass effect or midline shift. No abnormal extra-axial fluid collections. Gray-white matter junctions are distinct. Basal cisterns are not effaced. No acute intracranial hemorrhage. Vascular: Calcified and dilated intracranial carotid arteries. Skull: Calvarium appears intact. Sinuses/Orbits: No acute finding. Other: No significant changes since prior study. CT CERVICAL SPINE FINDINGS Alignment: Normal. Skull base and vertebrae: No acute fracture. No primary bone lesion or focal pathologic process. Soft tissues and spinal canal: No prevertebral fluid or swelling. No visible canal hematoma. Disc levels: Diffuse degenerative change throughout the cervical spine with narrowed interspaces and endplate hypertrophic changes. Degenerative changes throughout the facet joints. Upper chest: Prominent emphysematous changes in the lung apices. Other: Vascular calcifications in the carotid arteries. IMPRESSION: No acute intracranial abnormalities. Chronic atrophy and small vessel ischemic changes. Normal alignment of the cervical spine. Degenerative changes throughout the cervical spine. No acute displaced fractures identified. Electronically Signed   By: Burman Nieves M.D.   On: 01/16/2017 03:52    Assessment/Plan 1. Frequent falls 5  within 10 days Seems first fall led to pain and seems it is preventing her from moving as well Counseled on not trying to get up out of her wheelchair and walk across the room, but only transfer short distances from wheelchair to bed  Is to begin PT tomorrow Cbc, bmp, UA were unremarkable on 3/16 and looks good clinically  2. Closed fracture of multiple ribs of left side, initial encounter -d/c prn hydrocodone (pt has more hallucinations and falls with more narcotics), cont scheduled bid hydrocodone and tid tylenol 325mg  -no concern for shoulder fx--seems moving left shoulder brings on the anterior rib pain -use aspercreme over ribs otc qid --pt reports heat and ice have not been effective -staff need to remind pt to do incentive spirometer 10 breaths tid  3. Vascular dementia with behavior disturbance -cont current regimen with seroquel for her hallucinations and depressive symptoms  4. Hypothyroidism due to acquired atrophy of thyroid -cont levothyroxine and f/u lab today: - TSH  5. Pill dysphagia -staff to cut pills in half that can be so patient can better swallow them and encourage her to drink milk, water, juice after them or take a spoon of pudding/applesauce  6. Senile osteoporosis -cont prolia therapy (due in June)   7.  Constipation -cont miralax and senokot-s, but hold miralax when pt has loose stools until they return to normal  D/c ventolin due to non-use (doubt pt can operate the device either)  Labs/tests ordered:   Orders Placed This Encounter  Procedures  . TSH   Next appt:  04/10/2017   Breyona Swander L. Dicy Smigel, D.O. Geriatrics 06/10/2017 [facility] [address], [place] Cell Phone (Mon-Fri 8am-5pm):  [phone] On Call:  [phone] & follow prompts after 5pm & weekends Office Phone:  [phone] Office Fax:  [phone]

## 2017-01-29 DIAGNOSIS — R296 Repeated falls: Secondary | ICD-10-CM | POA: Diagnosis not present

## 2017-01-29 DIAGNOSIS — M6281 Muscle weakness (generalized): Secondary | ICD-10-CM | POA: Diagnosis not present

## 2017-01-29 DIAGNOSIS — R262 Difficulty in walking, not elsewhere classified: Secondary | ICD-10-CM | POA: Diagnosis not present

## 2017-01-30 ENCOUNTER — Telehealth: Payer: Self-pay | Admitting: Internal Medicine

## 2017-01-30 NOTE — Telephone Encounter (Signed)
Just confirming hydrocodone 5 325 (take 1 tab by mouth bid)   Please Advise.

## 2017-01-30 NOTE — Telephone Encounter (Signed)
Jane [name] called for  Jane [name] requesting a Hydrocodone refill by March 30th.  The medication is not in the patient's medication list anymore because it was changed in last visit, (stop prn hydrocodone and continue scheduled bid hydrocodone) but was not updated in medication list.  What would you like the dosage to be and instructions for it?

## 2017-01-30 NOTE — Telephone Encounter (Signed)
Just as I wrote in the note please (bid scheduled, same dose as prn used to be). I believe it was changed before the actual visit sometime between the last visit and this one when she had one of her falls.

## 2017-01-31 DIAGNOSIS — R262 Difficulty in walking, not elsewhere classified: Secondary | ICD-10-CM | POA: Diagnosis not present

## 2017-01-31 DIAGNOSIS — R296 Repeated falls: Secondary | ICD-10-CM | POA: Diagnosis not present

## 2017-01-31 DIAGNOSIS — M6281 Muscle weakness (generalized): Secondary | ICD-10-CM | POA: Diagnosis not present

## 2017-01-31 MED ORDER — HYDROCODONE-ACETAMINOPHEN 5-325 MG PO TABS: ORAL_TABLET | ORAL | 0 refills | Status: DC

## 2017-01-31 NOTE — Telephone Encounter (Signed)
Printed a Rx for Hydrocodone 5/325  One tablet by mouth twice daily for pain. Placed for Dr. Chilton Si to sign. Daughter informed.

## 2017-02-01 ENCOUNTER — Other Ambulatory Visit: Payer: Self-pay | Admitting: Internal Medicine

## 2017-02-06 DIAGNOSIS — M6281 Muscle weakness (generalized): Secondary | ICD-10-CM | POA: Diagnosis not present

## 2017-02-06 DIAGNOSIS — R296 Repeated falls: Secondary | ICD-10-CM | POA: Diagnosis not present

## 2017-02-06 DIAGNOSIS — R262 Difficulty in walking, not elsewhere classified: Secondary | ICD-10-CM | POA: Diagnosis not present

## 2017-02-08 DIAGNOSIS — R296 Repeated falls: Secondary | ICD-10-CM | POA: Diagnosis not present

## 2017-02-08 DIAGNOSIS — R262 Difficulty in walking, not elsewhere classified: Secondary | ICD-10-CM | POA: Diagnosis not present

## 2017-02-08 DIAGNOSIS — M6281 Muscle weakness (generalized): Secondary | ICD-10-CM | POA: Diagnosis not present

## 2017-02-13 DIAGNOSIS — R262 Difficulty in walking, not elsewhere classified: Secondary | ICD-10-CM | POA: Diagnosis not present

## 2017-02-13 DIAGNOSIS — M6281 Muscle weakness (generalized): Secondary | ICD-10-CM | POA: Diagnosis not present

## 2017-02-13 DIAGNOSIS — R296 Repeated falls: Secondary | ICD-10-CM | POA: Diagnosis not present

## 2017-02-15 DIAGNOSIS — R262 Difficulty in walking, not elsewhere classified: Secondary | ICD-10-CM | POA: Diagnosis not present

## 2017-02-15 DIAGNOSIS — M6281 Muscle weakness (generalized): Secondary | ICD-10-CM | POA: Diagnosis not present

## 2017-02-15 DIAGNOSIS — R296 Repeated falls: Secondary | ICD-10-CM | POA: Diagnosis not present

## 2017-02-20 DIAGNOSIS — M6281 Muscle weakness (generalized): Secondary | ICD-10-CM | POA: Diagnosis not present

## 2017-02-20 DIAGNOSIS — R262 Difficulty in walking, not elsewhere classified: Secondary | ICD-10-CM | POA: Diagnosis not present

## 2017-02-20 DIAGNOSIS — R296 Repeated falls: Secondary | ICD-10-CM | POA: Diagnosis not present

## 2017-02-22 DIAGNOSIS — M6281 Muscle weakness (generalized): Secondary | ICD-10-CM | POA: Diagnosis not present

## 2017-02-22 DIAGNOSIS — R296 Repeated falls: Secondary | ICD-10-CM | POA: Diagnosis not present

## 2017-02-22 DIAGNOSIS — R262 Difficulty in walking, not elsewhere classified: Secondary | ICD-10-CM | POA: Diagnosis not present

## 2017-02-27 DIAGNOSIS — R296 Repeated falls: Secondary | ICD-10-CM | POA: Diagnosis not present

## 2017-02-27 DIAGNOSIS — R262 Difficulty in walking, not elsewhere classified: Secondary | ICD-10-CM | POA: Diagnosis not present

## 2017-02-27 DIAGNOSIS — M6281 Muscle weakness (generalized): Secondary | ICD-10-CM | POA: Diagnosis not present

## 2017-03-01 DIAGNOSIS — R296 Repeated falls: Secondary | ICD-10-CM | POA: Diagnosis not present

## 2017-03-01 DIAGNOSIS — M6281 Muscle weakness (generalized): Secondary | ICD-10-CM | POA: Diagnosis not present

## 2017-03-01 DIAGNOSIS — R262 Difficulty in walking, not elsewhere classified: Secondary | ICD-10-CM | POA: Diagnosis not present

## 2017-03-02 ENCOUNTER — Other Ambulatory Visit: Payer: Self-pay | Admitting: Internal Medicine

## 2017-03-02 ENCOUNTER — Other Ambulatory Visit: Payer: Self-pay | Admitting: *Deleted

## 2017-03-02 MED ORDER — HYDROCODONE-ACETAMINOPHEN 5-325 MG PO TABS: ORAL_TABLET | ORAL | 0 refills | Status: DC

## 2017-03-02 NOTE — Telephone Encounter (Signed)
Jane [name], daughter will pick up on Monday.

## 2017-03-06 DIAGNOSIS — R262 Difficulty in walking, not elsewhere classified: Secondary | ICD-10-CM | POA: Diagnosis not present

## 2017-03-06 DIAGNOSIS — M6281 Muscle weakness (generalized): Secondary | ICD-10-CM | POA: Diagnosis not present

## 2017-03-06 DIAGNOSIS — R296 Repeated falls: Secondary | ICD-10-CM | POA: Diagnosis not present

## 2017-03-08 DIAGNOSIS — R262 Difficulty in walking, not elsewhere classified: Secondary | ICD-10-CM | POA: Diagnosis not present

## 2017-03-08 DIAGNOSIS — R296 Repeated falls: Secondary | ICD-10-CM | POA: Diagnosis not present

## 2017-03-08 DIAGNOSIS — M6281 Muscle weakness (generalized): Secondary | ICD-10-CM | POA: Diagnosis not present

## 2017-03-13 DIAGNOSIS — R262 Difficulty in walking, not elsewhere classified: Secondary | ICD-10-CM | POA: Diagnosis not present

## 2017-03-13 DIAGNOSIS — R296 Repeated falls: Secondary | ICD-10-CM | POA: Diagnosis not present

## 2017-03-13 DIAGNOSIS — M6281 Muscle weakness (generalized): Secondary | ICD-10-CM | POA: Diagnosis not present

## 2017-03-15 DIAGNOSIS — M6281 Muscle weakness (generalized): Secondary | ICD-10-CM | POA: Diagnosis not present

## 2017-03-15 DIAGNOSIS — R262 Difficulty in walking, not elsewhere classified: Secondary | ICD-10-CM | POA: Diagnosis not present

## 2017-03-15 DIAGNOSIS — R296 Repeated falls: Secondary | ICD-10-CM | POA: Diagnosis not present

## 2017-03-20 DIAGNOSIS — R262 Difficulty in walking, not elsewhere classified: Secondary | ICD-10-CM | POA: Diagnosis not present

## 2017-03-20 DIAGNOSIS — M6281 Muscle weakness (generalized): Secondary | ICD-10-CM | POA: Diagnosis not present

## 2017-03-20 DIAGNOSIS — R296 Repeated falls: Secondary | ICD-10-CM | POA: Diagnosis not present

## 2017-03-22 DIAGNOSIS — R262 Difficulty in walking, not elsewhere classified: Secondary | ICD-10-CM | POA: Diagnosis not present

## 2017-03-22 DIAGNOSIS — R296 Repeated falls: Secondary | ICD-10-CM | POA: Diagnosis not present

## 2017-03-22 DIAGNOSIS — M6281 Muscle weakness (generalized): Secondary | ICD-10-CM | POA: Diagnosis not present

## 2017-03-27 DIAGNOSIS — M6281 Muscle weakness (generalized): Secondary | ICD-10-CM | POA: Diagnosis not present

## 2017-03-27 DIAGNOSIS — R296 Repeated falls: Secondary | ICD-10-CM | POA: Diagnosis not present

## 2017-03-27 DIAGNOSIS — R262 Difficulty in walking, not elsewhere classified: Secondary | ICD-10-CM | POA: Diagnosis not present

## 2017-03-29 DIAGNOSIS — R262 Difficulty in walking, not elsewhere classified: Secondary | ICD-10-CM | POA: Diagnosis not present

## 2017-03-29 DIAGNOSIS — R296 Repeated falls: Secondary | ICD-10-CM | POA: Diagnosis not present

## 2017-03-29 DIAGNOSIS — M6281 Muscle weakness (generalized): Secondary | ICD-10-CM | POA: Diagnosis not present

## 2017-04-03 DIAGNOSIS — R296 Repeated falls: Secondary | ICD-10-CM | POA: Diagnosis not present

## 2017-04-03 DIAGNOSIS — M6281 Muscle weakness (generalized): Secondary | ICD-10-CM | POA: Diagnosis not present

## 2017-04-03 DIAGNOSIS — R262 Difficulty in walking, not elsewhere classified: Secondary | ICD-10-CM | POA: Diagnosis not present

## 2017-04-04 ENCOUNTER — Other Ambulatory Visit: Payer: Self-pay | Admitting: Internal Medicine

## 2017-04-04 ENCOUNTER — Other Ambulatory Visit: Payer: Self-pay | Admitting: *Deleted

## 2017-04-04 MED ORDER — HYDROCODONE-ACETAMINOPHEN 5-325 MG PO TABS: ORAL_TABLET | ORAL | 0 refills | Status: DC

## 2017-04-04 NOTE — Telephone Encounter (Signed)
Jane [name] aware rx signed and available for pick-up

## 2017-04-04 NOTE — Telephone Encounter (Signed)
Patient daughter, [name] Requested and will pick up

## 2017-04-05 DIAGNOSIS — R262 Difficulty in walking, not elsewhere classified: Secondary | ICD-10-CM | POA: Diagnosis not present

## 2017-04-05 DIAGNOSIS — R296 Repeated falls: Secondary | ICD-10-CM | POA: Diagnosis not present

## 2017-04-05 DIAGNOSIS — M6281 Muscle weakness (generalized): Secondary | ICD-10-CM | POA: Diagnosis not present

## 2017-04-10 ENCOUNTER — Ambulatory Visit: Payer: Medicare Other

## 2017-04-10 DIAGNOSIS — M6281 Muscle weakness (generalized): Secondary | ICD-10-CM | POA: Diagnosis not present

## 2017-04-10 DIAGNOSIS — R262 Difficulty in walking, not elsewhere classified: Secondary | ICD-10-CM | POA: Diagnosis not present

## 2017-04-10 DIAGNOSIS — R296 Repeated falls: Secondary | ICD-10-CM | POA: Diagnosis not present

## 2017-04-11 ENCOUNTER — Encounter: Payer: Self-pay | Admitting: Internal Medicine

## 2017-04-12 DIAGNOSIS — M6281 Muscle weakness (generalized): Secondary | ICD-10-CM | POA: Diagnosis not present

## 2017-04-12 DIAGNOSIS — R296 Repeated falls: Secondary | ICD-10-CM | POA: Diagnosis not present

## 2017-04-12 DIAGNOSIS — R262 Difficulty in walking, not elsewhere classified: Secondary | ICD-10-CM | POA: Diagnosis not present

## 2017-04-16 DIAGNOSIS — R262 Difficulty in walking, not elsewhere classified: Secondary | ICD-10-CM | POA: Diagnosis not present

## 2017-04-16 DIAGNOSIS — R296 Repeated falls: Secondary | ICD-10-CM | POA: Diagnosis not present

## 2017-04-16 DIAGNOSIS — M6281 Muscle weakness (generalized): Secondary | ICD-10-CM | POA: Diagnosis not present

## 2017-04-18 DIAGNOSIS — R262 Difficulty in walking, not elsewhere classified: Secondary | ICD-10-CM | POA: Diagnosis not present

## 2017-04-18 DIAGNOSIS — R296 Repeated falls: Secondary | ICD-10-CM | POA: Diagnosis not present

## 2017-04-18 DIAGNOSIS — M6281 Muscle weakness (generalized): Secondary | ICD-10-CM | POA: Diagnosis not present

## 2017-04-24 DIAGNOSIS — R296 Repeated falls: Secondary | ICD-10-CM | POA: Diagnosis not present

## 2017-04-24 DIAGNOSIS — M6281 Muscle weakness (generalized): Secondary | ICD-10-CM | POA: Diagnosis not present

## 2017-04-24 DIAGNOSIS — R262 Difficulty in walking, not elsewhere classified: Secondary | ICD-10-CM | POA: Diagnosis not present

## 2017-04-26 ENCOUNTER — Encounter: Payer: Self-pay | Admitting: Internal Medicine

## 2017-04-26 ENCOUNTER — Ambulatory Visit (INDEPENDENT_AMBULATORY_CARE_PROVIDER_SITE_OTHER): Payer: Medicare Other | Admitting: Internal Medicine

## 2017-04-26 VITALS — BP 128/60 | HR 78 | Temp 98.2°F | Wt 121.0 lb

## 2017-04-26 DIAGNOSIS — F0151 Vascular dementia with behavioral disturbance: Secondary | ICD-10-CM | POA: Diagnosis not present

## 2017-04-26 DIAGNOSIS — M810 Age-related osteoporosis without current pathological fracture: Secondary | ICD-10-CM

## 2017-04-26 DIAGNOSIS — E034 Atrophy of thyroid (acquired): Secondary | ICD-10-CM | POA: Diagnosis not present

## 2017-04-26 DIAGNOSIS — K5904 Chronic idiopathic constipation: Secondary | ICD-10-CM | POA: Insufficient documentation

## 2017-04-26 DIAGNOSIS — J449 Chronic obstructive pulmonary disease, unspecified: Secondary | ICD-10-CM

## 2017-04-26 DIAGNOSIS — J4489 Other specified chronic obstructive pulmonary disease: Secondary | ICD-10-CM

## 2017-04-26 DIAGNOSIS — F01518 Vascular dementia, unspecified severity, with other behavioral disturbance: Secondary | ICD-10-CM

## 2017-04-26 DIAGNOSIS — R1310 Dysphagia, unspecified: Secondary | ICD-10-CM | POA: Diagnosis not present

## 2017-04-26 LAB — TSH: TSH: 5.47 mIU/L — ABNORMAL HIGH

## 2017-04-26 MED ORDER — SERTRALINE HCL 100 MG PO TABS: 100.0000 mg | ORAL_TABLET | Freq: Every day | ORAL | 0 refills | Status: AC

## 2017-04-26 MED ORDER — LORAZEPAM 0.5 MG PO TABS: 0.5000 mg | ORAL_TABLET | Freq: Every evening | ORAL | 0 refills | Status: DC | PRN

## 2017-04-26 MED ORDER — QUETIAPINE FUMARATE 50 MG PO TABS: 50.0000 mg | ORAL_TABLET | Freq: Two times a day (BID) | ORAL | 0 refills | Status: AC

## 2017-04-26 MED ORDER — DENOSUMAB 60 MG/ML ~~LOC~~ SOLN
60.0000 mg | Freq: Once | SUBCUTANEOUS | Status: AC
  Administered 2017-04-26: 60 mg via SUBCUTANEOUS

## 2017-04-26 NOTE — Progress Notes (Signed)
Location:  [facility] clinic Provider:  Bharath Bernstein L. Renato Gails, D.O., C.M.D.  Code Status: DNR Goals of Care:  Advanced Directives 01/25/2017  Does Patient Have a Medical Advance Directive? Yes  Type of Advance Directive Out of facility DNR (pink MOST or yellow form)  Does patient want to make changes to medical advance directive? -  Copy of Healthcare Power of Attorney in Chart? -  Would patient like information on creating a medical advance directive? -  Pre-existing out of facility DNR order (yellow form or pink MOST form) Pink MOST form placed in chart (order not valid for inpatient use);Yellow form placed in chart (order not valid for inpatient use)   Chief Complaint  Patient presents with  . Medical Management of Chronic Issues    FOLLOW-UP    HPI: Patient is a 83 y.o. female seen today for medical management of chronic diseases.    Has had a cold and cough for weeks.  She's been using ricola cough drops.  Says she's coughing up sputum--light brown or black in color.  No blood noted.  Cough is not keeping her awake.  May have chills.    Falls:  Has not fallen since her last appt.  She is still getting PT from after the series of falls when she was sitting too far forward in her wheelchair and was walking w/o her walker.    Rib pain:  Still painful on left at time.  Still using incentive spirometer.    Vascular dementia with hallucinations:  Ongoing.  Having some more difficulty dressing in the mornings especially putting pants on.  Is working with OT also. Thinks people are coming into her room in groups in the middle of the night.   Hypothyroidism:  TSH had been elevated.  There had been concerns last time about swallowing pills so needs to be reassessed.    Pill dysphagia:  Pills are getting cut in half/crushed and she's able to get them down better.  Senile osteoporosis:  prolia due this appt.  She is taking ca with D and additional D.    Constipation:  Still having a "blowout"  once in a while, but no consistent loose stools.  Has gained a few lbs.    Past Medical History:  Diagnosis Date  . Abdominal pain, right lower quadrant   . Abdominal pain, right upper quadrant   . Abnormal involuntary movements(781.0)   . Abnormality of gait   . Anal fissure   . Anemia, unspecified   . Anxiety state, unspecified   . CAP (community acquired pneumonia) 01/31/2014  . Chest pain, unspecified   . Closed fracture of maxillary sinus, left 06/18/2015  . Closed left hip fracture (HCC) 06/25/2014  . Emphysema of lung (HCC)   . Essential and other specified forms of tremor   . Fracture of orbital floor, left, closed 06/18/2015  . Gastritis 02/13/2013  . Glaucoma    both eyes  . Humerus fracture   . Hyperlipidemia   . Hypertension   . Hyposmolality and/or hyponatremia   . Impacted cerumen   . Intestinal or peritoneal adhesions with obstruction (postoperative) (postinfection)   . Laceration of left ear canal 04/13/2014  . Long term (current) use of anticoagulants   . Loss of weight   . Lumbago   . Macular degeneration (senile) of retina, unspecified   . Major depressive disorder, single episode, unspecified   . Osteoarthrosis, unspecified whether generalized or localized, unspecified site   . Osteoporosis, senile   .  Other and unspecified hyperlipidemia   . Other pulmonary embolism and infarction   . Other vitamin B12 deficiency anemia   . Pain in limb   . Postoperative anemia due to acute blood loss 01/31/2014  . Unspecified cataract   . Unspecified essential hypertension   . Unspecified glaucoma(365.9)   . Unspecified hearing loss   . Unspecified hypothyroidism   . Vitamin B 12 deficiency 06/19/2015    Past Surgical History:  Procedure Laterality Date  . ABDOMINAL HYSTERECTOMY    . BACK SURGERY  2003  . BLADDER SUSPENSION  1993  . EXCISIONAL HEMORRHOIDECTOMY  1980  . INTRAMEDULLARY (IM) NAIL INTERTROCHANTERIC Right 01/27/2014   Procedure: INTRAMEDULLARY (IM) NAIL  INTERTROCHANTRIC;  Surgeon: Sheral Apley, MD;  Location: [facility] OR;  Service: Orthopedics;  Laterality: Right;  . INTRAMEDULLARY (IM) NAIL INTERTROCHANTERIC Left 06/26/2014   Procedure: LEFT HIP INTRAMEDULLARY (IM) NAIL;  Surgeon: Cheral Almas, MD;  Location: [facility] OR;  Service: Orthopedics;  Laterality: Left;  . TRIGGER FINGER RELEASE  2009   Dr Teressa Senter    Allergies  Allergen Reactions  . Lyrica [Pregabalin] Other (See Comments)    Auditory hallucinations  . Celebrex [Celecoxib]     unknown  . Chlorzoxazone     unknown  . Fentanyl Anxiety    Allergies as of 04/26/2017      Reactions   Lyrica [pregabalin] Other (See Comments)   Auditory hallucinations   Celebrex [celecoxib]    unknown   Chlorzoxazone    unknown   Fentanyl Anxiety      Medication List       Accurate as of 04/26/17  3:10 PM. Always use your most recent med list.          acetaminophen 325 MG tablet Commonly known as:  TYLENOL Take 1 tablet (325 mg total) by mouth 3 (three) times daily.   amLODipine 5 MG tablet Commonly known as:  NORVASC TAKE 1 TABLET BY MOUTH DAILY   calcium carbonate 750 MG chewable tablet Commonly known as:  TUMS EX Chew 1 tablet by mouth daily.   calcium-vitamin D 500-200 MG-UNIT tablet Commonly known as:  OSCAL WITH D Take 1 tablet by mouth daily.   eucerin cream Apply 1 application topically daily.   fexofenadine 180 MG tablet Commonly known as:  ALLEGRA Take 180 mg by mouth daily.   HYDROcodone-acetaminophen 5-325 MG tablet Commonly known as:  NORCO/VICODIN Take one tablet by mouth twice daily for pain. Do not exceed 3gm of APAP in 24 hrs   latanoprost 0.005 % ophthalmic solution Commonly known as:  XALATAN Place 1 drop into both eyes at bedtime.   levothyroxine 75 MCG tablet Commonly known as:  SYNTHROID, LEVOTHROID TAKE 1 TABLET BY MOUTH EVERY DAY BEFORE BREAKFAST   LORazepam 0.5 MG tablet Commonly known as:  ATIVAN TAKE 1 TABLET BY MOUTH EVERY NIGHT AT  BEDTIME AS NEEDED   lovastatin 20 MG tablet Commonly known as:  MEVACOR Take 1 tablet (20 mg total) by mouth every evening.   neomycin-bacitracin-polymyxin [phone] ointment Apply 1 application topically daily as needed (to right wrist and elbow for scrape).   polyethylene glycol powder powder Commonly known as:  GLYCOLAX/MIRALAX DISSOLVE 17 GRAMS IN LIQUID AND DRINK DAILY   QUEtiapine 50 MG tablet Commonly known as:  SEROQUEL TAKE 1 TABLET BY MOUTH EVERY MORNING AND AT BEDTIME   sennosides-docusate sodium 8.6-50 MG tablet Commonly known as:  SENOKOT-S Take 1 tablet by mouth at bedtime.   sertraline 100 MG  tablet Commonly known as:  ZOLOFT TAKE 1 TABLET BY MOUTH ONCE DAILY   Vitamin D 2000 units tablet Take one tablet daily by mouth for Vitamin D       Review of Systems:  Review of Systems  Constitutional: Positive for chills. Negative for fever, malaise/fatigue and weight loss.  HENT: Positive for hearing loss.   Eyes: Positive for blurred vision.  Respiratory: Positive for cough and sputum production. Negative for hemoptysis, shortness of breath and wheezing.   Cardiovascular: Negative for chest pain, palpitations and leg swelling.  Gastrointestinal: Negative for abdominal pain, blood in stool, constipation and melena.  Genitourinary: Negative for dysuria.  Musculoskeletal: Positive for back pain and joint pain. Negative for falls.  Neurological: Positive for weakness. Negative for dizziness and loss of consciousness.  Endo/Heme/Allergies:       Low thyroid  Psychiatric/Behavioral: Positive for hallucinations and memory loss.    Health Maintenance  Topic Date Due  . INFLUENZA VACCINE  06/06/2017  . TETANUS/TDAP  12/17/2021  . DEXA SCAN  Completed  . PNA vac Low Risk Adult  Completed    Physical Exam: Vitals:   04/26/17 1503  Weight: 121 lb (54.9 kg)   Body mass index is 22.86 kg/m. Physical Exam  Constitutional: She appears well-developed and  well-nourished. No distress.  Eyes:  glasses  Cardiovascular: Normal rate, regular rhythm, normal heart sounds and intact distal pulses.   Pulmonary/Chest: Effort normal and breath sounds normal. No respiratory distress. She has no wheezes. She has no rales. She exhibits tenderness.  Abdominal: Bowel sounds are normal.  Musculoskeletal: Normal range of motion.  Comes in wheelchair as she has for a couple of years now  Neurological: She is alert.  Seems she is having delusions about things falsely happening at night  Skin: Skin is warm and dry. There is pallor.  Psychiatric: She has a normal mood and affect.    Labs reviewed: Basic Metabolic Panel:  Recent Labs  34/19/62 1415 01/16/17 0151 01/23/17 1343 01/25/17 1534  NA 141 140 141  --   K 4.3 3.7 3.8  --   CL 104 108 103  --   CO2 28 27  --   --   GLUCOSE 86 98 104*  --   BUN 19 21* 24*  --   CREATININE 0.74 0.71 0.70  --   CALCIUM 9.7 9.5  --   --   TSH 0.10*  --   --  4.94*   Liver Function Tests:  Recent Labs  09/14/16 1415  AST 21  ALT 12  ALKPHOS 50  BILITOT 0.5  PROT 6.7  ALBUMIN 4.5   No results for input(s): LIPASE, AMYLASE in the last 8760 hours. No results for input(s): AMMONIA in the last 8760 hours. CBC:  Recent Labs  09/14/16 1415 01/16/17 0151 01/23/17 1333 01/23/17 1343  WBC 6.7 5.6 5.4  --   NEUTROABS 4,556 3.6 3.9  --   HGB 12.4 11.8* 11.3* 11.2*  HCT 38.1 34.2* 34.1* 33.0*  MCV 95.0 93.4 92.2  --   PLT 202 181 171  --    Lipid Panel: No results for input(s): CHOL, HDL, LDLCALC, TRIG, CHOLHDL, LDLDIRECT in the last 8760 hours. Lab Results  Component Value Date   HGBA1C 5.5 02/27/2015    Assessment/Plan 1. Chronic obstructive airway disease with asthma (HCC) -no wheezing at this point, does have a cough related to URI -use vaporizer, push fluids, stop tessalon (needs to cough up sputum)  2. Vascular  dementia with behavior disturbance -cont seroquel for delusions and  hallucinations which cause extreme agitation and phone calls to family at times -having some functional decline which is likely progression of the dementia, but may also be made worse by her URI  3. Hypothyroidism due to acquired atrophy of thyroid - cont current levothyroxine pending tsh - TSH  4. Pill dysphagia -better with cutting pills or crushing those that can be -was felt to maybe contribute to altered tsh  5. Senile osteoporosis - cont vitamin D and got prolia today - denosumab (PROLIA) injection 60 mg; Inject 60 mg into the skin once.  6. Chronic idiopathic constipation -stable with stool softener and miralax, sounds like loose stools are less frequent now with hold parameters on miralax  Labs/tests ordered:   Orders Placed This Encounter  Procedures  . TSH   Next appt:  2017-07-21   Camelle Henkels L. Anner Baity, D.O. Geriatrics [facility] [address] [place], [place] [address] Cell Phone (Mon-Fri 8am-5pm):  [phone] On Call:  [phone] & follow prompts after 5pm & weekends Office Phone:  [phone] Office Fax:  [phone]

## 2017-04-27 DIAGNOSIS — R262 Difficulty in walking, not elsewhere classified: Secondary | ICD-10-CM | POA: Diagnosis not present

## 2017-04-27 DIAGNOSIS — R296 Repeated falls: Secondary | ICD-10-CM | POA: Diagnosis not present

## 2017-04-27 DIAGNOSIS — M6281 Muscle weakness (generalized): Secondary | ICD-10-CM | POA: Diagnosis not present

## 2017-04-30 ENCOUNTER — Telehealth: Payer: Self-pay | Admitting: *Deleted

## 2017-04-30 NOTE — Telephone Encounter (Signed)
error 

## 2017-05-01 DIAGNOSIS — R262 Difficulty in walking, not elsewhere classified: Secondary | ICD-10-CM | POA: Diagnosis not present

## 2017-05-01 DIAGNOSIS — R296 Repeated falls: Secondary | ICD-10-CM | POA: Diagnosis not present

## 2017-05-01 DIAGNOSIS — M6281 Muscle weakness (generalized): Secondary | ICD-10-CM | POA: Diagnosis not present

## 2017-05-03 DIAGNOSIS — M6281 Muscle weakness (generalized): Secondary | ICD-10-CM | POA: Diagnosis not present

## 2017-05-03 DIAGNOSIS — R262 Difficulty in walking, not elsewhere classified: Secondary | ICD-10-CM | POA: Diagnosis not present

## 2017-05-03 DIAGNOSIS — R296 Repeated falls: Secondary | ICD-10-CM | POA: Diagnosis not present

## 2017-05-04 ENCOUNTER — Other Ambulatory Visit: Payer: Self-pay | Admitting: *Deleted

## 2017-05-04 MED ORDER — HYDROCODONE-ACETAMINOPHEN 5-325 MG PO TABS: ORAL_TABLET | ORAL | 0 refills | Status: DC

## 2017-05-04 NOTE — Telephone Encounter (Signed)
Patient daughter, [name] requested and will pick up 

## 2017-05-07 ENCOUNTER — Other Ambulatory Visit: Payer: Self-pay | Admitting: *Deleted

## 2017-05-07 MED ORDER — LEVOTHYROXINE SODIUM 100 MCG PO TABS: 100.0000 ug | ORAL_TABLET | Freq: Every day | ORAL | 0 refills | Status: AC

## 2017-05-08 DIAGNOSIS — M6281 Muscle weakness (generalized): Secondary | ICD-10-CM | POA: Diagnosis not present

## 2017-05-08 DIAGNOSIS — R262 Difficulty in walking, not elsewhere classified: Secondary | ICD-10-CM | POA: Diagnosis not present

## 2017-05-08 DIAGNOSIS — R296 Repeated falls: Secondary | ICD-10-CM | POA: Diagnosis not present

## 2017-05-11 DIAGNOSIS — R296 Repeated falls: Secondary | ICD-10-CM | POA: Diagnosis not present

## 2017-05-11 DIAGNOSIS — R262 Difficulty in walking, not elsewhere classified: Secondary | ICD-10-CM | POA: Diagnosis not present

## 2017-05-11 DIAGNOSIS — M6281 Muscle weakness (generalized): Secondary | ICD-10-CM | POA: Diagnosis not present

## 2017-05-14 DIAGNOSIS — R262 Difficulty in walking, not elsewhere classified: Secondary | ICD-10-CM | POA: Diagnosis not present

## 2017-05-14 DIAGNOSIS — R296 Repeated falls: Secondary | ICD-10-CM | POA: Diagnosis not present

## 2017-05-14 DIAGNOSIS — M6281 Muscle weakness (generalized): Secondary | ICD-10-CM | POA: Diagnosis not present

## 2017-05-17 DIAGNOSIS — M6281 Muscle weakness (generalized): Secondary | ICD-10-CM | POA: Diagnosis not present

## 2017-05-17 DIAGNOSIS — R296 Repeated falls: Secondary | ICD-10-CM | POA: Diagnosis not present

## 2017-05-17 DIAGNOSIS — R262 Difficulty in walking, not elsewhere classified: Secondary | ICD-10-CM | POA: Diagnosis not present

## 2017-05-21 ENCOUNTER — Telehealth: Payer: Self-pay

## 2017-05-21 MED ORDER — HYDROCODONE-ACETAMINOPHEN 5-325 MG PO TABS: 1.0000 | ORAL_TABLET | Freq: Four times a day (QID) | ORAL | 0 refills | Status: DC | PRN

## 2017-05-21 NOTE — Telephone Encounter (Signed)
Jane [name] with [facility] called to request an order for a mobile xray due to left rib pain after fall.   If order approved, please fax order to [facility] at [phone]

## 2017-05-21 NOTE — Telephone Encounter (Signed)
Orders were faxed to [facility] at [phone].

## 2017-05-21 NOTE — Telephone Encounter (Signed)
See xray from 01/23/2017 Fracture of the left seventh rib. Question a fracture in left eighth Rib. She already has hx of rib fracture. If pt has normal breath sounds and VS would need to treat pain vs getting another xray.   Will increase hydrocodone/apap 5/325 1 tablet by mouth every 6 hours as needed for rib pain. Not to exceed 3 gm of tylenol in 24 hours. To hold for sedation.  (please update medication list and print to fax Rx over)   To monitor VS and breath sounds q shift for 7 days and encourage IS

## 2017-05-21 NOTE — Telephone Encounter (Signed)
Did she hit anything? How are her breath sounds? Does she have something to take for pain?

## 2017-05-21 NOTE — Telephone Encounter (Signed)
I spoke with Elease Hashimoto (floor nurse) and she stated that it is unknown if patient hit anything. Patient has normal breath sounds. Pt has tylenol for pain but patient has been screaming all night and day that she is in pain.   Per Shanda Bumps: She does not believe that an xray will affect treatment. Shanda Bumps spoke with Elease Hashimoto regarding treatment and provided instructions.

## 2017-05-23 ENCOUNTER — Telehealth: Payer: Self-pay

## 2017-05-23 NOTE — Telephone Encounter (Signed)
Shanda Bumps wrote those orders.  Please check with her about whether home health referral is necessary.

## 2017-05-23 NOTE — Telephone Encounter (Signed)
5 days should be fine.

## 2017-05-23 NOTE — Telephone Encounter (Signed)
Marylene Land with [facility] called to inform Dr.Reed that the nursing staff is only available 5 days a week. Dr.Reed wrote order for patient to have lungs assessed for 7 days.   If patient to have lungs assessed x 7 days patient will need a home health order.  Please advise, when call returned ask for [facility]

## 2017-05-23 NOTE — Telephone Encounter (Signed)
Jane [name] with [facility]. Requested Fax order. Faxed to [phone]

## 2017-05-23 NOTE — Telephone Encounter (Signed)
Aggie Cosier with [employer] called and stated that they DO NOT have a team there at [facility] to assess lung sounds and needs an order for Home Health Nursing to provide this.  Also needs the order to state to D/C Nursing staff for 5 days to have lungs assessed.  Please Advise.

## 2017-05-24 NOTE — Telephone Encounter (Signed)
When I spoked to [place] who said she was the floor nurse she told me that she assessed for lung sounds and they were normal at the time and told me that they were able to do so. However if they are unable to assess lung sounds then discontinue order for lung sound assessment.   To cont VS with O2 sats x 1 week and to notify with changes.   To make follow up appt if pain does not improve or any concern in regards to increase RR, decrease in sats, elevation in temperature.

## 2017-05-24 NOTE — Telephone Encounter (Signed)
Faxed to [facility] Attn: Aggie Cosier

## 2017-05-24 NOTE — Telephone Encounter (Signed)
Message routed to Jessica

## 2017-05-31 ENCOUNTER — Other Ambulatory Visit: Payer: Self-pay | Admitting: *Deleted

## 2017-05-31 ENCOUNTER — Other Ambulatory Visit: Payer: Self-pay | Admitting: Internal Medicine

## 2017-05-31 MED ORDER — LOVASTATIN 20 MG PO TABS: 20.0000 mg | ORAL_TABLET | Freq: Every evening | ORAL | 3 refills | Status: AC

## 2017-05-31 NOTE — Telephone Encounter (Signed)
Daughter calling upset that we haven't sent in rx to [facility] for lovastatin. I advised this has been sent in several times today. I called [facility] and they stated the phone lines are down and all prescriptions are on hold. They also stated pt can go to a different [facility] if rx is needed today. Spoke with daughter and advised results and she will check back later.

## 2017-05-31 NOTE — Telephone Encounter (Signed)
[name], daughter requested refill on patient's lovastatin. Sent to pharmacy. Also wants to pick up RF of Hydrocodone on Monday. Reviewed chart and Jessica adjusted dosage due to rib pain. Will print on Monday.

## 2017-06-04 ENCOUNTER — Other Ambulatory Visit: Payer: Self-pay | Admitting: *Deleted

## 2017-06-04 MED ORDER — HYDROCODONE-ACETAMINOPHEN 5-325 MG PO TABS: 1.0000 | ORAL_TABLET | Freq: Four times a day (QID) | ORAL | 0 refills | Status: AC | PRN

## 2017-06-04 NOTE — Telephone Encounter (Signed)
Patient daughter, [name] requested and will pick up 

## 2017-06-07 ENCOUNTER — Other Ambulatory Visit: Payer: Self-pay | Admitting: Internal Medicine

## 2017-06-07 DIAGNOSIS — E034 Atrophy of thyroid (acquired): Secondary | ICD-10-CM

## 2017-06-09 ENCOUNTER — Other Ambulatory Visit: Payer: Self-pay | Admitting: Internal Medicine

## 2017-06-11 NOTE — Telephone Encounter (Signed)
rx called into pharmacy

## 2017-06-12 ENCOUNTER — Other Ambulatory Visit: Payer: Self-pay

## 2017-06-17 ENCOUNTER — Other Ambulatory Visit: Payer: Self-pay | Admitting: Internal Medicine

## 2017-06-17 ENCOUNTER — Encounter (HOSPITAL_COMMUNITY): Payer: Self-pay | Admitting: Emergency Medicine

## 2017-06-17 ENCOUNTER — Inpatient Hospital Stay (HOSPITAL_COMMUNITY)
Admission: EM | Admit: 2017-06-17 | Discharge: 2017-07-07 | DRG: 199 | Disposition: E | Payer: Medicare Other | Attending: General Surgery | Admitting: General Surgery

## 2017-06-17 DIAGNOSIS — S2243XA Multiple fractures of ribs, bilateral, initial encounter for closed fracture: Secondary | ICD-10-CM | POA: Diagnosis not present

## 2017-06-17 DIAGNOSIS — D649 Anemia, unspecified: Secondary | ICD-10-CM | POA: Diagnosis present

## 2017-06-17 DIAGNOSIS — R54 Age-related physical debility: Secondary | ICD-10-CM | POA: Diagnosis present

## 2017-06-17 DIAGNOSIS — S2242XA Multiple fractures of ribs, left side, initial encounter for closed fracture: Secondary | ICD-10-CM

## 2017-06-17 DIAGNOSIS — W19XXXA Unspecified fall, initial encounter: Secondary | ICD-10-CM

## 2017-06-17 DIAGNOSIS — F0150 Vascular dementia without behavioral disturbance: Secondary | ICD-10-CM | POA: Diagnosis present

## 2017-06-17 DIAGNOSIS — S272XXA Traumatic hemopneumothorax, initial encounter: Principal | ICD-10-CM | POA: Diagnosis present

## 2017-06-17 DIAGNOSIS — J939 Pneumothorax, unspecified: Secondary | ICD-10-CM

## 2017-06-17 DIAGNOSIS — Z66 Do not resuscitate: Secondary | ICD-10-CM | POA: Diagnosis present

## 2017-06-17 DIAGNOSIS — Y92129 Unspecified place in nursing home as the place of occurrence of the external cause: Secondary | ICD-10-CM

## 2017-06-17 DIAGNOSIS — Z7189 Other specified counseling: Secondary | ICD-10-CM

## 2017-06-17 DIAGNOSIS — J942 Hemothorax: Secondary | ICD-10-CM

## 2017-06-17 DIAGNOSIS — T797XXA Traumatic subcutaneous emphysema, initial encounter: Secondary | ICD-10-CM | POA: Diagnosis present

## 2017-06-17 DIAGNOSIS — Z515 Encounter for palliative care: Secondary | ICD-10-CM

## 2017-06-17 DIAGNOSIS — Z90710 Acquired absence of both cervix and uterus: Secondary | ICD-10-CM

## 2017-06-17 DIAGNOSIS — Z452 Encounter for adjustment and management of vascular access device: Secondary | ICD-10-CM | POA: Diagnosis not present

## 2017-06-17 DIAGNOSIS — S270XXA Traumatic pneumothorax, initial encounter: Secondary | ICD-10-CM

## 2017-06-17 DIAGNOSIS — F411 Generalized anxiety disorder: Secondary | ICD-10-CM | POA: Diagnosis present

## 2017-06-17 DIAGNOSIS — I10 Essential (primary) hypertension: Secondary | ICD-10-CM | POA: Diagnosis present

## 2017-06-17 DIAGNOSIS — Z86711 Personal history of pulmonary embolism: Secondary | ICD-10-CM

## 2017-06-17 DIAGNOSIS — R079 Chest pain, unspecified: Secondary | ICD-10-CM | POA: Diagnosis not present

## 2017-06-17 DIAGNOSIS — Z801 Family history of malignant neoplasm of trachea, bronchus and lung: Secondary | ICD-10-CM

## 2017-06-17 DIAGNOSIS — S2241XA Multiple fractures of ribs, right side, initial encounter for closed fracture: Secondary | ICD-10-CM

## 2017-06-17 DIAGNOSIS — H3530 Unspecified macular degeneration: Secondary | ICD-10-CM | POA: Diagnosis not present

## 2017-06-17 DIAGNOSIS — H409 Unspecified glaucoma: Secondary | ICD-10-CM | POA: Diagnosis present

## 2017-06-17 DIAGNOSIS — W1830XA Fall on same level, unspecified, initial encounter: Secondary | ICD-10-CM | POA: Diagnosis present

## 2017-06-17 DIAGNOSIS — D519 Vitamin B12 deficiency anemia, unspecified: Secondary | ICD-10-CM | POA: Diagnosis present

## 2017-06-17 DIAGNOSIS — Z888 Allergy status to other drugs, medicaments and biological substances status: Secondary | ICD-10-CM

## 2017-06-17 DIAGNOSIS — R100 Acute abdomen: Secondary | ICD-10-CM | POA: Diagnosis not present

## 2017-06-17 DIAGNOSIS — R402252 Coma scale, best verbal response, oriented, at arrival to emergency department: Secondary | ICD-10-CM | POA: Diagnosis present

## 2017-06-17 DIAGNOSIS — J432 Centrilobular emphysema: Secondary | ICD-10-CM | POA: Diagnosis present

## 2017-06-17 DIAGNOSIS — J9691 Respiratory failure, unspecified with hypoxia: Secondary | ICD-10-CM | POA: Diagnosis not present

## 2017-06-17 DIAGNOSIS — M810 Age-related osteoporosis without current pathological fracture: Secondary | ICD-10-CM | POA: Diagnosis present

## 2017-06-17 DIAGNOSIS — R1310 Dysphagia, unspecified: Secondary | ICD-10-CM | POA: Diagnosis present

## 2017-06-17 DIAGNOSIS — J90 Pleural effusion, not elsewhere classified: Secondary | ICD-10-CM | POA: Diagnosis not present

## 2017-06-17 DIAGNOSIS — E039 Hypothyroidism, unspecified: Secondary | ICD-10-CM | POA: Diagnosis present

## 2017-06-17 DIAGNOSIS — R296 Repeated falls: Secondary | ICD-10-CM | POA: Diagnosis present

## 2017-06-17 DIAGNOSIS — Z87891 Personal history of nicotine dependence: Secondary | ICD-10-CM

## 2017-06-17 DIAGNOSIS — H9190 Unspecified hearing loss, unspecified ear: Secondary | ICD-10-CM | POA: Diagnosis present

## 2017-06-17 DIAGNOSIS — S2231XA Fracture of one rib, right side, initial encounter for closed fracture: Secondary | ICD-10-CM | POA: Diagnosis not present

## 2017-06-17 DIAGNOSIS — S0990XA Unspecified injury of head, initial encounter: Secondary | ICD-10-CM | POA: Diagnosis not present

## 2017-06-17 DIAGNOSIS — R402142 Coma scale, eyes open, spontaneous, at arrival to emergency department: Secondary | ICD-10-CM | POA: Diagnosis present

## 2017-06-17 DIAGNOSIS — Z8249 Family history of ischemic heart disease and other diseases of the circulatory system: Secondary | ICD-10-CM

## 2017-06-17 DIAGNOSIS — S199XXA Unspecified injury of neck, initial encounter: Secondary | ICD-10-CM | POA: Diagnosis not present

## 2017-06-17 DIAGNOSIS — S3690XA Unspecified injury of unspecified intra-abdominal organ, initial encounter: Secondary | ICD-10-CM | POA: Diagnosis not present

## 2017-06-17 DIAGNOSIS — R402362 Coma scale, best motor response, obeys commands, at arrival to emergency department: Secondary | ICD-10-CM | POA: Diagnosis present

## 2017-06-17 DIAGNOSIS — S2249XA Multiple fractures of ribs, unspecified side, initial encounter for closed fracture: Secondary | ICD-10-CM | POA: Diagnosis not present

## 2017-06-17 DIAGNOSIS — Z9181 History of falling: Secondary | ICD-10-CM

## 2017-06-17 DIAGNOSIS — F329 Major depressive disorder, single episode, unspecified: Secondary | ICD-10-CM | POA: Diagnosis present

## 2017-06-17 DIAGNOSIS — Z83511 Family history of glaucoma: Secondary | ICD-10-CM

## 2017-06-17 DIAGNOSIS — S2243XS Multiple fractures of ribs, bilateral, sequela: Secondary | ICD-10-CM

## 2017-06-17 NOTE — ED Triage Notes (Signed)
Pt BIB [facility] from [facility] for an unwitnessed fall. Pt c/o right hip pain. EMS noted moving ribs on the patients right side. Pts O2 sat was 86 on RA, does not wear oxygen at home. Fentynal given in route. Pt states she hit her head but denies LOC. Pt is not anticoagulated. A+Ox4

## 2017-06-18 ENCOUNTER — Emergency Department (HOSPITAL_COMMUNITY): Payer: Medicare Other

## 2017-06-18 ENCOUNTER — Encounter (HOSPITAL_COMMUNITY): Payer: Self-pay

## 2017-06-18 DIAGNOSIS — R296 Repeated falls: Secondary | ICD-10-CM | POA: Diagnosis present

## 2017-06-18 DIAGNOSIS — J9691 Respiratory failure, unspecified with hypoxia: Secondary | ICD-10-CM | POA: Diagnosis not present

## 2017-06-18 DIAGNOSIS — S199XXA Unspecified injury of neck, initial encounter: Secondary | ICD-10-CM | POA: Diagnosis not present

## 2017-06-18 DIAGNOSIS — Z515 Encounter for palliative care: Secondary | ICD-10-CM | POA: Diagnosis not present

## 2017-06-18 DIAGNOSIS — S272XXA Traumatic hemopneumothorax, initial encounter: Secondary | ICD-10-CM | POA: Diagnosis present

## 2017-06-18 DIAGNOSIS — S0990XA Unspecified injury of head, initial encounter: Secondary | ICD-10-CM | POA: Diagnosis not present

## 2017-06-18 DIAGNOSIS — H409 Unspecified glaucoma: Secondary | ICD-10-CM | POA: Diagnosis present

## 2017-06-18 DIAGNOSIS — Z86711 Personal history of pulmonary embolism: Secondary | ICD-10-CM | POA: Diagnosis not present

## 2017-06-18 DIAGNOSIS — F0150 Vascular dementia without behavioral disturbance: Secondary | ICD-10-CM | POA: Diagnosis not present

## 2017-06-18 DIAGNOSIS — J90 Pleural effusion, not elsewhere classified: Secondary | ICD-10-CM | POA: Diagnosis not present

## 2017-06-18 DIAGNOSIS — S270XXA Traumatic pneumothorax, initial encounter: Secondary | ICD-10-CM | POA: Diagnosis not present

## 2017-06-18 DIAGNOSIS — Z801 Family history of malignant neoplasm of trachea, bronchus and lung: Secondary | ICD-10-CM | POA: Diagnosis not present

## 2017-06-18 DIAGNOSIS — Z888 Allergy status to other drugs, medicaments and biological substances status: Secondary | ICD-10-CM | POA: Diagnosis not present

## 2017-06-18 DIAGNOSIS — Z90710 Acquired absence of both cervix and uterus: Secondary | ICD-10-CM | POA: Diagnosis not present

## 2017-06-18 DIAGNOSIS — J432 Centrilobular emphysema: Secondary | ICD-10-CM | POA: Diagnosis present

## 2017-06-18 DIAGNOSIS — Z8249 Family history of ischemic heart disease and other diseases of the circulatory system: Secondary | ICD-10-CM | POA: Diagnosis not present

## 2017-06-18 DIAGNOSIS — Z7189 Other specified counseling: Secondary | ICD-10-CM | POA: Diagnosis not present

## 2017-06-18 DIAGNOSIS — M810 Age-related osteoporosis without current pathological fracture: Secondary | ICD-10-CM | POA: Diagnosis present

## 2017-06-18 DIAGNOSIS — S2241XA Multiple fractures of ribs, right side, initial encounter for closed fracture: Secondary | ICD-10-CM | POA: Diagnosis not present

## 2017-06-18 DIAGNOSIS — S2243XA Multiple fractures of ribs, bilateral, initial encounter for closed fracture: Secondary | ICD-10-CM | POA: Diagnosis present

## 2017-06-18 DIAGNOSIS — E039 Hypothyroidism, unspecified: Secondary | ICD-10-CM | POA: Diagnosis present

## 2017-06-18 DIAGNOSIS — I10 Essential (primary) hypertension: Secondary | ICD-10-CM | POA: Diagnosis present

## 2017-06-18 DIAGNOSIS — F411 Generalized anxiety disorder: Secondary | ICD-10-CM | POA: Diagnosis present

## 2017-06-18 DIAGNOSIS — Z452 Encounter for adjustment and management of vascular access device: Secondary | ICD-10-CM | POA: Diagnosis not present

## 2017-06-18 DIAGNOSIS — H9190 Unspecified hearing loss, unspecified ear: Secondary | ICD-10-CM | POA: Diagnosis present

## 2017-06-18 DIAGNOSIS — Y92129 Unspecified place in nursing home as the place of occurrence of the external cause: Secondary | ICD-10-CM | POA: Diagnosis not present

## 2017-06-18 DIAGNOSIS — Z66 Do not resuscitate: Secondary | ICD-10-CM | POA: Diagnosis present

## 2017-06-18 DIAGNOSIS — Z87891 Personal history of nicotine dependence: Secondary | ICD-10-CM | POA: Diagnosis not present

## 2017-06-18 DIAGNOSIS — W1830XA Fall on same level, unspecified, initial encounter: Secondary | ICD-10-CM | POA: Diagnosis present

## 2017-06-18 DIAGNOSIS — J942 Hemothorax: Secondary | ICD-10-CM | POA: Diagnosis not present

## 2017-06-18 DIAGNOSIS — H3530 Unspecified macular degeneration: Secondary | ICD-10-CM | POA: Diagnosis present

## 2017-06-18 DIAGNOSIS — S2243XS Multiple fractures of ribs, bilateral, sequela: Secondary | ICD-10-CM | POA: Diagnosis not present

## 2017-06-18 DIAGNOSIS — D519 Vitamin B12 deficiency anemia, unspecified: Secondary | ICD-10-CM | POA: Diagnosis present

## 2017-06-18 DIAGNOSIS — J939 Pneumothorax, unspecified: Secondary | ICD-10-CM | POA: Diagnosis not present

## 2017-06-18 DIAGNOSIS — S2249XA Multiple fractures of ribs, unspecified side, initial encounter for closed fracture: Secondary | ICD-10-CM | POA: Diagnosis not present

## 2017-06-18 DIAGNOSIS — F329 Major depressive disorder, single episode, unspecified: Secondary | ICD-10-CM | POA: Diagnosis present

## 2017-06-18 DIAGNOSIS — Z83511 Family history of glaucoma: Secondary | ICD-10-CM | POA: Diagnosis not present

## 2017-06-18 DIAGNOSIS — R079 Chest pain, unspecified: Secondary | ICD-10-CM | POA: Diagnosis not present

## 2017-06-18 DIAGNOSIS — R402142 Coma scale, eyes open, spontaneous, at arrival to emergency department: Secondary | ICD-10-CM | POA: Diagnosis present

## 2017-06-18 DIAGNOSIS — J9601 Acute respiratory failure with hypoxia: Secondary | ICD-10-CM | POA: Diagnosis not present

## 2017-06-18 DIAGNOSIS — S271XXA Traumatic hemothorax, initial encounter: Secondary | ICD-10-CM | POA: Diagnosis not present

## 2017-06-18 LAB — CBC WITH DIFFERENTIAL/PLATELET
Basophils Absolute: 0 10*3/uL (ref 0.0–0.1)
Basophils Relative: 0 %
EOS ABS: 0 10*3/uL (ref 0.0–0.7)
EOS PCT: 0 %
HCT: 34.9 % — ABNORMAL LOW (ref 36.0–46.0)
Hemoglobin: 11.6 g/dL — ABNORMAL LOW (ref 12.0–15.0)
LYMPHS ABS: 1 10*3/uL (ref 0.7–4.0)
Lymphocytes Relative: 9 %
MCH: 32 pg (ref 26.0–34.0)
MCHC: 33.2 g/dL (ref 30.0–36.0)
MCV: 96.1 fL (ref 78.0–100.0)
MONOS PCT: 6 %
Monocytes Absolute: 0.8 10*3/uL (ref 0.1–1.0)
Neutro Abs: 9.9 10*3/uL — ABNORMAL HIGH (ref 1.7–7.7)
Neutrophils Relative %: 85 %
PLATELETS: 171 10*3/uL (ref 150–400)
RBC: 3.63 MIL/uL — ABNORMAL LOW (ref 3.87–5.11)
RDW: 13.9 % (ref 11.5–15.5)
WBC: 11.7 10*3/uL — AB (ref 4.0–10.5)

## 2017-06-18 LAB — BASIC METABOLIC PANEL
ANION GAP: 10 (ref 5–15)
BUN: 22 mg/dL — AB (ref 6–20)
CHLORIDE: 106 mmol/L (ref 101–111)
CO2: 24 mmol/L (ref 22–32)
CREATININE: 0.76 mg/dL (ref 0.44–1.00)
Calcium: 9.3 mg/dL (ref 8.9–10.3)
GFR calc Af Amer: >60 mL/min (ref >60)
GFR calc non Af Amer: >60 mL/min (ref >60)
GLUCOSE: 128 mg/dL — AB (ref 65–99)
POTASSIUM: 3.9 mmol/L (ref 3.5–5.1)
Sodium: 140 mmol/L (ref 135–145)

## 2017-06-18 LAB — HEPATIC FUNCTION PANEL
ALT: 70 U/L — ABNORMAL HIGH (ref 14–54)
AST: 85 U/L — ABNORMAL HIGH (ref 15–41)
Albumin: 4.1 g/dL (ref 3.5–5.0)
Alkaline Phosphatase: 62 U/L (ref 38–126)
Bilirubin, Direct: <0.1 mg/dL — ABNORMAL LOW (ref 0.1–0.5)
TOTAL PROTEIN: 6 g/dL — AB (ref 6.5–8.1)
Total Bilirubin: 0.6 mg/dL (ref 0.3–1.2)

## 2017-06-18 MED ORDER — MORPHINE SULFATE (PF) 2 MG/ML IV SOLN
2.0000 mg | INTRAVENOUS | Status: DC | PRN
  Administered 2017-06-18: 4 mg via INTRAVENOUS
  Administered 2017-06-18: 2 mg via INTRAVENOUS
  Administered 2017-06-18: 4 mg via INTRAVENOUS
  Administered 2017-06-18: 2 mg via INTRAVENOUS
  Administered 2017-06-19 (×3): 4 mg via INTRAVENOUS
  Administered 2017-06-20: 2 mg via INTRAVENOUS
  Filled 2017-06-18 (×5): qty 2
  Filled 2017-06-18 (×3): qty 1

## 2017-06-18 MED ORDER — ONDANSETRON HCL 4 MG/2ML IJ SOLN
4.0000 mg | Freq: Once | INTRAMUSCULAR | Status: AC
  Administered 2017-06-18: 4 mg via INTRAVENOUS
  Filled 2017-06-18: qty 2

## 2017-06-18 MED ORDER — TRAMADOL HCL 50 MG PO TABS
50.0000 mg | ORAL_TABLET | Freq: Four times a day (QID) | ORAL | Status: DC
  Administered 2017-06-18 – 2017-06-19 (×6): 50 mg via ORAL
  Filled 2017-06-18 (×7): qty 1

## 2017-06-18 MED ORDER — METHOCARBAMOL 500 MG PO TABS
500.0000 mg | ORAL_TABLET | Freq: Three times a day (TID) | ORAL | Status: DC
  Administered 2017-06-18 – 2017-06-19 (×4): 500 mg via ORAL
  Filled 2017-06-18 (×5): qty 1

## 2017-06-18 MED ORDER — POTASSIUM CHLORIDE IN NACL 20-0.9 MEQ/L-% IV SOLN
INTRAVENOUS | Status: DC
  Administered 2017-06-18: 10:00:00 via INTRAVENOUS
  Filled 2017-06-18: qty 1000

## 2017-06-18 MED ORDER — LATANOPROST 0.005 % OP SOLN
1.0000 [drp] | Freq: Every day | OPHTHALMIC | Status: DC
  Administered 2017-06-18 – 2017-06-20 (×3): 1 [drp] via OPHTHALMIC
  Filled 2017-06-18: qty 2.5

## 2017-06-18 MED ORDER — MORPHINE SULFATE (PF) 4 MG/ML IV SOLN
4.0000 mg | Freq: Once | INTRAVENOUS | Status: AC
  Administered 2017-06-18: 4 mg via INTRAVENOUS
  Filled 2017-06-18: qty 1

## 2017-06-18 MED ORDER — QUETIAPINE FUMARATE 25 MG PO TABS
50.0000 mg | ORAL_TABLET | Freq: Two times a day (BID) | ORAL | Status: DC
  Administered 2017-06-18 – 2017-06-19 (×3): 50 mg via ORAL
  Filled 2017-06-18 (×5): qty 2

## 2017-06-18 MED ORDER — CALCIUM CARBONATE ANTACID 500 MG PO CHEW
1.5000 | CHEWABLE_TABLET | Freq: Every day | ORAL | Status: DC
  Administered 2017-06-18: 300 mg via ORAL
  Filled 2017-06-18 (×3): qty 2

## 2017-06-18 MED ORDER — AMLODIPINE BESYLATE 5 MG PO TABS
5.0000 mg | ORAL_TABLET | Freq: Every day | ORAL | Status: DC
  Administered 2017-06-18 – 2017-06-19 (×2): 5 mg via ORAL
  Filled 2017-06-18 (×2): qty 1

## 2017-06-18 MED ORDER — ACETAMINOPHEN 325 MG PO TABS: 650.0000 mg | ORAL_TABLET | Freq: Four times a day (QID) | ORAL | Status: DC | PRN

## 2017-06-18 MED ORDER — MORPHINE SULFATE (PF) 4 MG/ML IV SOLN
4.0000 mg | INTRAVENOUS | Status: DC | PRN
  Administered 2017-06-18 (×2): 4 mg via INTRAVENOUS
  Filled 2017-06-18 (×2): qty 1

## 2017-06-18 MED ORDER — HYDROCODONE-ACETAMINOPHEN 5-325 MG PO TABS
1.0000 | ORAL_TABLET | Freq: Four times a day (QID) | ORAL | Status: DC | PRN
  Administered 2017-06-18: 1 via ORAL
  Filled 2017-06-18: qty 1

## 2017-06-18 MED ORDER — IOPAMIDOL (ISOVUE-300) INJECTION 61%
75.0000 mL | Freq: Once | INTRAVENOUS | Status: AC | PRN
  Administered 2017-06-18: 75 mL via INTRAVENOUS

## 2017-06-18 MED ORDER — SENNOSIDES-DOCUSATE SODIUM 8.6-50 MG PO TABS
1.0000 | ORAL_TABLET | Freq: Every day | ORAL | Status: DC
  Administered 2017-06-18: 1 via ORAL
  Filled 2017-06-18 (×2): qty 1

## 2017-06-18 MED ORDER — LEVOTHYROXINE SODIUM 100 MCG PO TABS
100.0000 ug | ORAL_TABLET | Freq: Every day | ORAL | Status: DC
  Administered 2017-06-18: 100 ug via ORAL
  Filled 2017-06-18 (×3): qty 1

## 2017-06-18 MED ORDER — SERTRALINE HCL 100 MG PO TABS
100.0000 mg | ORAL_TABLET | Freq: Every day | ORAL | Status: DC
  Administered 2017-06-18 – 2017-06-19 (×2): 100 mg via ORAL
  Filled 2017-06-18 (×3): qty 1

## 2017-06-18 MED ORDER — ZOLPIDEM TARTRATE 5 MG PO TABS: 5.0000 mg | ORAL_TABLET | Freq: Every evening | ORAL | Status: DC | PRN

## 2017-06-18 NOTE — H&P (Signed)
Jane [name] is an 83 y.o. female.   Chief Complaint: Fall with bilateral rib pain HPI: Jane [name] is a resident at a facility. She fell while trying to hold on to a safety bar. She immediately had bilateral rib pain. The pain is exacerbated by deep breaths. She does have dementia and is DO NOT RESUSCITATE. However, she is able to communicate somewhat. She was sent to the emergency department for further evaluation. CT chest shows bilateral rib fractures, occult right pneumothorax, moderate L Hemothorax. I was asked to see her for admission to the trauma service.  Past Medical History:  Diagnosis Date  . Abdominal pain, right lower quadrant   . Abdominal pain, right upper quadrant   . Abnormal involuntary movements(781.0)   . Abnormality of gait   . Anal fissure   . Anemia, unspecified   . Anxiety state, unspecified   . CAP (community acquired pneumonia) 01/31/2014  . Chest pain, unspecified   . Closed fracture of maxillary sinus, left 06/18/2015  . Closed left hip fracture ([place]) 06/25/2014  . Emphysema of lung ([place])   . Essential and other specified forms of tremor   . Fracture of orbital floor, left, closed 06/18/2015  . Gastritis 02/13/2013  . Glaucoma    both eyes  . Humerus fracture   . Hyperlipidemia   . Hypertension   . Hyposmolality and/or hyponatremia   . Impacted cerumen   . Intestinal or peritoneal adhesions with obstruction (postoperative) (postinfection) ([place])   . Laceration of left ear canal 04/13/2014  . Long term (current) use of anticoagulants   . Loss of weight   . Lumbago   . Macular degeneration (senile) of retina, unspecified   . Major depressive disorder, single episode, unspecified   . Osteoarthrosis, unspecified whether generalized or localized, unspecified site   . Osteoporosis, senile   . Other and unspecified hyperlipidemia   . Other pulmonary embolism and infarction   . Other vitamin B12 deficiency anemia   . Pain in limb   . Postoperative anemia due to acute  blood loss 01/31/2014  . Unspecified cataract   . Unspecified essential hypertension   . Unspecified glaucoma(365.9)   . Unspecified hearing loss   . Unspecified hypothyroidism   . Vitamin B 12 deficiency 06/19/2015    Past Surgical History:  Procedure Laterality Date  . ABDOMINAL HYSTERECTOMY    . BACK SURGERY  2003  . BLADDER SUSPENSION  1993  . EXCISIONAL HEMORRHOIDECTOMY  1980  . INTRAMEDULLARY (IM) NAIL INTERTROCHANTERIC Right 01/27/2014   Procedure: INTRAMEDULLARY (IM) NAIL INTERTROCHANTRIC;  Surgeon: Renette Butters, MD;  Location: [facility];  Service: Orthopedics;  Laterality: Right;  . INTRAMEDULLARY (IM) NAIL INTERTROCHANTERIC Left 06/26/2014   Procedure: LEFT HIP INTRAMEDULLARY (IM) NAIL;  Surgeon: Marianna Payment, MD;  Location: [facility];  Service: Orthopedics;  Laterality: Left;  . TRIGGER FINGER RELEASE  2009   Dr Daylene Katayama    Family History  Problem Relation Age of Onset  . Congestive Heart Failure Mother   . Macular degeneration Mother   . Heart disease Mother   . Cancer Sister        lung cancer  . Arthritis Daughter   . Heart disease Daughter   . Glaucoma Sister   . Cataracts Sister    Social History:  reports that she has quit smoking. Her smoking use included Cigarettes. She quit after 40.00 years of use. She has never used smokeless tobacco. She reports that she does not drink alcohol or use drugs.  Allergies:  Allergies  Allergen Reactions  . Lyrica [Pregabalin] Other (See Comments)    Auditory hallucinations  . Celebrex [Celecoxib]     unknown  . Chlorzoxazone     unknown  . Fentanyl Anxiety     (Not in a hospital admission)  Results for orders placed or performed during the hospital encounter of 06/26/2017 (from the past 48 hour(s))  Basic metabolic panel     Status: Abnormal   Collection Time: 06/18/17 12:30 AM  Result Value Ref Range   Sodium 140 135 - 145 mmol/L   Potassium 3.9 3.5 - 5.1 mmol/L   Chloride 106 101 - 111 mmol/L   CO2 24 22 - 32  mmol/L   Glucose, Bld 128 (H) 65 - 99 mg/dL   BUN 22 (H) 6 - 20 mg/dL   Creatinine, Ser 0.76 0.44 - 1.00 mg/dL   Calcium 9.3 8.9 - 10.3 mg/dL   GFR calc non Af Amer >60 >60 mL/min   GFR calc Af Amer >60 >60 mL/min    Comment: (NOTE) The eGFR has been calculated using the CKD EPI equation. This calculation has not been validated in all clinical situations. eGFR's persistently <60 mL/min signify possible Chronic Kidney Disease.    Anion gap 10 5 - 15  CBC with Differential     Status: Abnormal   Collection Time: 06/18/17 12:30 AM  Result Value Ref Range   WBC 11.7 (H) 4.0 - 10.5 K/uL   RBC 3.63 (L) 3.87 - 5.11 MIL/uL   Hemoglobin 11.6 (L) 12.0 - 15.0 g/dL   HCT 34.9 (L) 36.0 - 46.0 %   MCV 96.1 78.0 - 100.0 fL   MCH 32.0 26.0 - 34.0 pg   MCHC 33.2 30.0 - 36.0 g/dL   RDW 13.9 11.5 - 15.5 %   Platelets 171 150 - 400 K/uL   Neutrophils Relative % 85 %   Neutro Abs 9.9 (H) 1.7 - 7.7 K/uL   Lymphocytes Relative 9 %   Lymphs Abs 1.0 0.7 - 4.0 K/uL   Monocytes Relative 6 %   Monocytes Absolute 0.8 0.1 - 1.0 K/uL   Eosinophils Relative 0 %   Eosinophils Absolute 0.0 0.0 - 0.7 K/uL   Basophils Relative 0 %   Basophils Absolute 0.0 0.0 - 0.1 K/uL  Hepatic function panel     Status: Abnormal   Collection Time: 06/18/17 12:30 AM  Result Value Ref Range   Total Protein 6.0 (L) 6.5 - 8.1 g/dL   Albumin 4.1 3.5 - 5.0 g/dL   AST 85 (H) 15 - 41 U/L   ALT 70 (H) 14 - 54 U/L   Alkaline Phosphatase 62 38 - 126 U/L   Total Bilirubin 0.6 0.3 - 1.2 mg/dL   Bilirubin, Direct <0.1 (L) 0.1 - 0.5 mg/dL   Indirect Bilirubin NOT CALCULATED 0.3 - 0.9 mg/dL   Ct Head Wo Contrast  Result Date: 06/18/2017 CLINICAL DATA:  Status post unwitnessed fall; hit head. Decreased O2 saturation. Concern for head or cervical spine injury. Initial encounter. EXAM: CT HEAD WITHOUT CONTRAST CT CERVICAL SPINE WITHOUT CONTRAST TECHNIQUE: Multidetector CT imaging of the head and cervical spine was performed following  the standard protocol without intravenous contrast. Multiplanar CT image reconstructions of the cervical spine were also generated. COMPARISON:  CT of the head and cervical spine performed 01/16/2017 FINDINGS: CT HEAD FINDINGS Brain: No evidence of acute infarction, hemorrhage, hydrocephalus, extra-axial collection or mass lesion/mass effect. Prominence of the ventricles and sulci reflects mild cortical  volume loss. Diffuse periventricular and subcortical white matter change likely reflects small vessel ischemic microangiopathy. A chronic lacunar infarct is noted at the right basal ganglia. The brainstem and fourth ventricle are within normal limits. The cerebral hemispheres demonstrate grossly normal gray-white differentiation. No mass effect or midline shift is seen. Vascular: No hyperdense vessel or unexpected calcification. Skull: There is no evidence of fracture; visualized osseous structures are unremarkable in appearance. Sinuses/Orbits: The orbits are within normal limits. The paranasal sinuses and mastoid air cells are well-aerated. Other: No significant soft tissue abnormalities are seen. CT CERVICAL SPINE FINDINGS Alignment: There is mild grade 1 anterolisthesis of C7 on T1. Skull base and vertebrae: No acute fracture. No primary bone lesion or focal pathologic process. Soft tissues and spinal canal: No prevertebral fluid or swelling. No visible canal hematoma. Disc levels: Multilevel disc space narrowing is noted along the cervical spine, with underlying facet disease. Upper chest: A trace right apical pneumothorax is noted. Bilateral emphysema is noted at the lung apices. The thyroid gland is not well seen. Other: No additional soft tissue abnormalities are seen. IMPRESSION: 1. No evidence of traumatic intracranial injury or fracture. 2. No evidence of acute fracture or subluxation along the cervical spine. 3. Mild cortical volume loss and diffuse small vessel ischemic microangiopathy. 4. Chronic  lacunar infarct at the right basal ganglia. 5. Mild diffuse degenerative change along the cervical spine. 6. Trace right apical pneumothorax. Bilateral emphysema at the lung apices. Electronically Signed   By: Garald Balding M.D.   On: 06/18/2017 02:39   Ct Chest W Contrast  Result Date: 06/18/2017 CLINICAL DATA:  83 y/o  F; multiple rib fractures. EXAM: CT CHEST WITH CONTRAST TECHNIQUE: Multidetector CT imaging of the chest was performed during intravenous contrast administration. CONTRAST:  53m ISOVUE-300 IOPAMIDOL (ISOVUE-300) INJECTION 61% COMPARISON:  03/25/2008 CT chest FINDINGS: Cardiovascular: Stable mild cardiomegaly. Moderate coronary artery calcification. No pleural effusion. Normal caliber thoracic aorta with calcific atherosclerosis. Normal caliber main pulmonary artery. Mediastinum/Nodes: No enlarged mediastinal, hilar, or axillary lymph nodes. Thyroid gland, trachea, and esophagus demonstrate no significant findings. Lungs/Pleura: Moderate left and trace right pleural effusion. Small right-sided pneumothorax without evidence for tension. Moderate to severe centrilobular emphysema. Dependent bilateral lower lobe atelectasis. Upper Abdomen: Mild intrahepatic biliary ductal dilatation. Musculoskeletal: Right-sided displaced acute rib fractures from rib 4 through rib 11 with 2 part fractures of rib 6 through rib 9. Left-sided displaced acute rib fractures from rib 7 through rib 10 with 2 part fractures of ribs 7 and 8. Additionally there are subacute to chronic bilateral rib fractures with callus formation. Chronic fracture deformity of the left mid humerus. IMPRESSION: 1. Small right-sided pneumothorax. 2. Trace right and moderate left pleural effusions. 3. Right-sided displaced acute rib fractures from rib 4 through rib 11 with two part fractures of rib 6 through rib 9. 4. Left-sided displaced acute rib fractures from rib 7 through rib 10 with two part fractures of ribs 7 and 8. 5. Subcutaneous  emphysema and edema in the right lateral and posterior chest wall. 6. Stable cardiomegaly, coronary calcification, and centrilobular emphysema. 7. Mild nonspecific intrahepatic biliary ductal dilatation. These results were called by telephone at the time of interpretation on 06/18/2017 at 2:42 am to Dr. DDelora Fuel, who verbally acknowledged these results. Electronically Signed   By: LKristine GarbeM.D.   On: 06/18/2017 02:47   Ct Cervical Spine Wo Contrast  Result Date: 06/18/2017 CLINICAL DATA:  Status post unwitnessed fall; hit head. Decreased O2 saturation.  Concern for head or cervical spine injury. Initial encounter. EXAM: CT HEAD WITHOUT CONTRAST CT CERVICAL SPINE WITHOUT CONTRAST TECHNIQUE: Multidetector CT imaging of the head and cervical spine was performed following the standard protocol without intravenous contrast. Multiplanar CT image reconstructions of the cervical spine were also generated. COMPARISON:  CT of the head and cervical spine performed 01/16/2017 FINDINGS: CT HEAD FINDINGS Brain: No evidence of acute infarction, hemorrhage, hydrocephalus, extra-axial collection or mass lesion/mass effect. Prominence of the ventricles and sulci reflects mild cortical volume loss. Diffuse periventricular and subcortical white matter change likely reflects small vessel ischemic microangiopathy. A chronic lacunar infarct is noted at the right basal ganglia. The brainstem and fourth ventricle are within normal limits. The cerebral hemispheres demonstrate grossly normal gray-white differentiation. No mass effect or midline shift is seen. Vascular: No hyperdense vessel or unexpected calcification. Skull: There is no evidence of fracture; visualized osseous structures are unremarkable in appearance. Sinuses/Orbits: The orbits are within normal limits. The paranasal sinuses and mastoid air cells are well-aerated. Other: No significant soft tissue abnormalities are seen. CT CERVICAL SPINE FINDINGS  Alignment: There is mild grade 1 anterolisthesis of C7 on T1. Skull base and vertebrae: No acute fracture. No primary bone lesion or focal pathologic process. Soft tissues and spinal canal: No prevertebral fluid or swelling. No visible canal hematoma. Disc levels: Multilevel disc space narrowing is noted along the cervical spine, with underlying facet disease. Upper chest: A trace right apical pneumothorax is noted. Bilateral emphysema is noted at the lung apices. The thyroid gland is not well seen. Other: No additional soft tissue abnormalities are seen. IMPRESSION: 1. No evidence of traumatic intracranial injury or fracture. 2. No evidence of acute fracture or subluxation along the cervical spine. 3. Mild cortical volume loss and diffuse small vessel ischemic microangiopathy. 4. Chronic lacunar infarct at the right basal ganglia. 5. Mild diffuse degenerative change along the cervical spine. 6. Trace right apical pneumothorax. Bilateral emphysema at the lung apices. Electronically Signed   By: Garald Balding M.D.   On: 06/18/2017 02:39    Review of Systems  Unable to perform ROS: Dementia    Blood pressure (!) 156/85, pulse (!) 116, temperature 98.3 F (36.8 C), temperature source Oral, resp. rate 20, SpO2 92 %. Physical Exam  Constitutional: No distress.  HENT:  Head: Normocephalic.  Right Ear: External ear normal.  Left Ear: External ear normal.  Nose: Nose normal.  Mouth/Throat: Oropharynx is clear and moist.  Eyes: Pupils are equal, round, and reactive to light. No scleral icterus.  Neck: No tracheal deviation present.  No posterior midline tenderness, no pain on active range of motion  Cardiovascular: Normal rate, regular rhythm and normal heart sounds.   Respiratory: Effort normal. No stridor. No respiratory distress. She has no wheezes. She exhibits tenderness.  Tender bilateral ribs with bony crepitance on the left side, subcutaneous air crepitance on the right side, decreased breath  sounds left base  GI: Soft. She exhibits no distension. There is no tenderness. There is no rebound and no guarding.  Musculoskeletal: She exhibits edema.  Mild peripheral edema, no deformity or tenderness  Neurological: She is alert. She displays no tremor. She exhibits normal muscle tone. She displays no seizure activity. GCS eye subscore is 4. GCS verbal subscore is 5. GCS motor subscore is 6.  Strength decreased left lower extremity somewhat, patient reports this as chronic  Skin: Skin is warm.  Psychiatric: She has a normal mood and affect.     Assessment/Plan  Fall Bilateral rib fractures with occult right pneumothorax and moderate left hemothorax - I called her daughter Otila Kluver and discussed things. Consent was obtained for placement of left-sided chest tube. That was done in the emergency department. Plan multimodal pain control, pulmonary toilet, PT/OT. DNR Admit to stepdown unit, trauma service  Zenovia Jarred, MD 06/18/2017, 4:37 AM

## 2017-06-18 NOTE — Evaluation (Signed)
Physical Therapy Evaluation Patient Details Name: Jane [name] MRN: [id number] DOB: 1925-10-17 Today's Date: 06/18/2017   History of Present Illness  Emryn is a resident at a facility. She fell while trying to hold on to a safety bar. She immediately had bilateral rib pain. The pain is exacerbated by deep breaths. She does have dementia and is DO NOT RESUSCITATE. However, she is able to communicate somewhat. She was sent to the emergency department for further evaluation. CT chest shows bilateral rib fractures, occult right pneumothorax, moderate L Hemothorax.   Past Medical History:  Diagnosis Date  . Abdominal pain, right lower quadrant   . Abdominal pain, right upper quadrant   . Abnormal involuntary movements(781.0)   . Abnormality of gait   . Anal fissure   . Anemia, unspecified   . Anxiety state, unspecified   . CAP (community acquired pneumonia) 01/31/2014  . Chest pain, unspecified   . Closed fracture of maxillary sinus, left 06/18/2015  . Closed left hip fracture ([facility]) 06/25/2014  . Emphysema of lung ([facility])   . Essential and other specified forms of tremor   . Fracture of orbital floor, left, closed 06/18/2015  . Gastritis 02/13/2013  . Glaucoma    both eyes  . Humerus fracture   . Hyperlipidemia   . Hypertension   . Hyposmolality and/or hyponatremia   . Impacted cerumen   . Intestinal or peritoneal adhesions with obstruction (postoperative) (postinfection) ([facility])   . Laceration of left ear canal 04/13/2014  . Long term (current) use of anticoagulants   . Loss of weight   . Lumbago   . Macular degeneration (senile) of retina, unspecified   . Major depressive disorder, single episode, unspecified   . Osteoarthrosis, unspecified whether generalized or localized, unspecified site   . Osteoporosis, senile   . Other and unspecified hyperlipidemia   . Other pulmonary embolism and infarction   . Other vitamin B12 deficiency anemia   . Pain in limb   . Postoperative anemia due to  acute blood loss 01/31/2014  . Unspecified cataract   . Unspecified essential hypertension   . Unspecified glaucoma(365.9)   . Unspecified hearing loss   . Unspecified hypothyroidism   . Vitamin B 12 deficiency 06/19/2015    Clinical Impression  Pt admitted with above diagnosis. Pt currently with functional limitations due to the deficits listed below (see PT Problem List). Pt was unable to progress to EOB and was bed level eval only.  Pt in a lot of pain and could only exercise bil LEs and left UE.  Will need SNF.   Pt will benefit from skilled PT to increase their independence and safety with mobility to allow discharge to the venue listed below.      Follow Up Recommendations SNF;Supervision/Assistance - 24 hour    Equipment Recommendations  None recommended by PT    Recommendations for Other Services       Precautions / Restrictions Precautions Precautions: Fall Precaution Comments: chest tube left side Restrictions Weight Bearing Restrictions: No      Mobility  Bed Mobility Overal bed mobility: Needs Assistance Bed Mobility: Rolling Rolling: Total assist         General bed mobility comments: pt could not roll due to pain  Transfers                 General transfer comment: unable to attempt  Ambulation/Gait                Stairs  Wheelchair Mobility    Modified Rankin (Stroke Patients Only)       Balance                                             Pertinent Vitals/Pain Pain Assessment: 0-10 Pain Score: 10-Worst pain ever Pain Location: right ribs Pain Descriptors / Indicators: Crushing;Grimacing;Guarding;Sharp Pain Intervention(s): Limited activity within patient's tolerance;Monitored during session;Repositioned;Patient requesting pain meds-RN notified    Home Living Family/patient expects to be discharged to:: Skilled nursing facility                      Prior Function Level of  Independence: Needs assistance   Gait / Transfers Assistance Needed: A by staff  at SNF  ADL's / Homemaking Assistance Needed: A by staff at SNF        Hand Dominance   Dominant Hand: Right    Extremity/Trunk Assessment   Upper Extremity Assessment Upper Extremity Assessment: RUE deficits/detail;LUE deficits/detail RUE Deficits / Details: could not raise arm as it caused rib pain on right side.  LUE Deficits / Details: grossly 3-/5    Lower Extremity Assessment Lower Extremity Assessment: Generalized weakness    Cervical / Trunk Assessment Cervical / Trunk Assessment: Normal  Communication   Communication: HOH  Cognition Arousal/Alertness: Awake/alert Behavior During Therapy: Anxious Overall Cognitive Status: History of cognitive impairments - at baseline                                        General Comments      Exercises General Exercises - Upper Extremity Shoulder Flexion: AROM;Left;10 reps;Supine Elbow Flexion: AROM;Left;10 reps;Supine General Exercises - Lower Extremity Ankle Circles/Pumps: AROM;Both;10 reps;Supine Quad Sets: AROM;Both;10 reps;Supine Heel Slides: AROM;Both;10 reps;Supine   Assessment/Plan    PT Assessment Patient needs continued PT services  PT Problem List Decreased strength;Decreased range of motion;Decreased activity tolerance;Decreased balance;Decreased mobility;Decreased knowledge of use of DME;Decreased safety awareness;Decreased knowledge of precautions;Pain       PT Treatment Interventions DME instruction;Functional mobility training;Therapeutic exercise;Therapeutic activities;Balance training;Patient/family education    PT Goals (Current goals can be found in the Care Plan section)  Acute Rehab PT Goals Patient Stated Goal: to get better PT Goal Formulation: With patient Time For Goal Achievement: 07/02/17 Potential to Achieve Goals: Good    Frequency Min 2X/week   Barriers to discharge Decreased  caregiver support      Co-evaluation               AM-PAC PT "6 Clicks" Daily Activity  Outcome Measure Difficulty turning over in bed (including adjusting bedclothes, sheets and blankets)?: Total Difficulty moving from lying on back to sitting on the side of the bed? : Total Difficulty sitting down on and standing up from a chair with arms (e.g., wheelchair, bedside commode, etc,.)?: Total Help needed moving to and from a bed to chair (including a wheelchair)?: Total Help needed walking in hospital room?: Total Help needed climbing 3-5 steps with a railing? : Total 6 Click Score: 6    End of Session   Activity Tolerance: Patient limited by pain Patient left: in bed;with call bell/phone within reach;with bed alarm set Nurse Communication: Mobility status;Patient requests pain meds PT Visit Diagnosis: Unsteadiness on feet (R26.81);Muscle weakness (generalized) (M62.81)  Time: 7341-9379 PT Time Calculation (min) (ACUTE ONLY): 12 min   Charges:   PT Evaluation $PT Eval Moderate Complexity: 1 Mod     PT G Codes:        Mao Lockner,PT Acute Rehabilitation [phone] (pager)   Berline Lopes 06/18/2017, 8:05 PM

## 2017-06-18 NOTE — Progress Notes (Signed)
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  She is having a lot of pain, even at rest.  Objective: Vital signs in last 24 hours: Temp:  [98.3 F (36.8 C)-98.9 F (37.2 C)] 98.9 F (37.2 C) (08/13 0646) Pulse Rate:  [87-116] 101 (08/13 0646) Resp:  [14-21] 21 (08/13 0646) BP: (90-164)/(56-93) 154/62 (08/13 0646) SpO2:  [87 %-97 %] 92 % (08/13 0646) Weight:  [54.1 kg (119 lb 4.8 oz)] 54.1 kg (119 lb 4.8 oz) (08/13 0646)    Intake/Output from previous day: No intake/output data recorded. Intake/Output this shift: No intake/output data recorded.  General: Hurts when you auscultates chest.  Breath sounds diminished   Lungs: Diminished breath sound bilaterally.  Abd: Soft, benign  Extremities: No changes  Neuro: Hard of hearing.    Lab Results: CBC   Recent Labs  06/18/17 0030  WBC 11.7*  HGB 11.6*  HCT 34.9*  PLT 171   BMET  Recent Labs  06/18/17 0030  NA 140  K 3.9  CL 106  CO2 24  GLUCOSE 128*  BUN 22*  CREATININE 0.76  CALCIUM 9.3   PT/INR No results for input(s): LABPROT, INR in the last 72 hours. ABG No results for input(s): PHART, HCO3 in the last 72 hours.  Invalid input(s): PCO2, PO2  Studies/Results: Ct Head Wo Contrast  Result Date: 06/18/2017 CLINICAL DATA:  Status post unwitnessed fall; hit head. Decreased O2 saturation. Concern for head or cervical spine injury. Initial encounter. EXAM: CT HEAD WITHOUT CONTRAST CT CERVICAL SPINE WITHOUT CONTRAST TECHNIQUE: Multidetector CT imaging of the head and cervical spine was performed following the standard protocol without intravenous contrast. Multiplanar CT image reconstructions of the cervical spine were also generated. COMPARISON:  CT of the head and cervical spine performed 01/16/2017 FINDINGS: CT HEAD FINDINGS Brain: No evidence of acute infarction, hemorrhage, hydrocephalus, extra-axial collection or mass lesion/mass effect.  Prominence of the ventricles and sulci reflects mild cortical volume loss. Diffuse periventricular and subcortical white matter change likely reflects small vessel ischemic microangiopathy. A chronic lacunar infarct is noted at the right basal ganglia. The brainstem and fourth ventricle are within normal limits. The cerebral hemispheres demonstrate grossly normal gray-white differentiation. No mass effect or midline shift is seen. Vascular: No hyperdense vessel or unexpected calcification. Skull: There is no evidence of fracture; visualized osseous structures are unremarkable in appearance. Sinuses/Orbits: The orbits are within normal limits. The paranasal sinuses and mastoid air cells are well-aerated. Other: No significant soft tissue abnormalities are seen. CT CERVICAL SPINE FINDINGS Alignment: There is mild grade 1 anterolisthesis of C7 on T1. Skull base and vertebrae: No acute fracture. No primary bone lesion or focal pathologic process. Soft tissues and spinal canal: No prevertebral fluid or swelling. No visible canal hematoma. Disc levels: Multilevel disc space narrowing is noted along the cervical spine, with underlying facet disease. Upper chest: A trace right apical pneumothorax is noted. Bilateral emphysema is noted at the lung apices. The thyroid gland is not well seen. Other: No additional soft tissue abnormalities are seen. IMPRESSION: 1. No evidence of traumatic intracranial injury or fracture. 2. No evidence of acute fracture or subluxation along the cervical spine. 3. Mild cortical volume loss and diffuse small vessel ischemic microangiopathy. 4. Chronic lacunar infarct at the right basal ganglia. 5. Mild diffuse degenerative change along the cervical spine. 6. Trace right apical pneumothorax. Bilateral emphysema at the lung apices. Electronically Signed   By: Beryle Beams.D.  On: 06/18/2017 02:39   Ct Chest W Contrast  Result Date: 06/18/2017 CLINICAL DATA:  83 y/o  F; multiple rib  fractures. EXAM: CT CHEST WITH CONTRAST TECHNIQUE: Multidetector CT imaging of the chest was performed during intravenous contrast administration. CONTRAST:  27mL ISOVUE-300 IOPAMIDOL (ISOVUE-300) INJECTION 61% COMPARISON:  03/25/2008 CT chest FINDINGS: Cardiovascular: Stable mild cardiomegaly. Moderate coronary artery calcification. No pleural effusion. Normal caliber thoracic aorta with calcific atherosclerosis. Normal caliber main pulmonary artery. Mediastinum/Nodes: No enlarged mediastinal, hilar, or axillary lymph nodes. Thyroid gland, trachea, and esophagus demonstrate no significant findings. Lungs/Pleura: Moderate left and trace right pleural effusion. Small right-sided pneumothorax without evidence for tension. Moderate to severe centrilobular emphysema. Dependent bilateral lower lobe atelectasis. Upper Abdomen: Mild intrahepatic biliary ductal dilatation. Musculoskeletal: Right-sided displaced acute rib fractures from rib 4 through rib 11 with 2 part fractures of rib 6 through rib 9. Left-sided displaced acute rib fractures from rib 7 through rib 10 with 2 part fractures of ribs 7 and 8. Additionally there are subacute to chronic bilateral rib fractures with callus formation. Chronic fracture deformity of the left mid humerus. IMPRESSION: 1. Small right-sided pneumothorax. 2. Trace right and moderate left pleural effusions. 3. Right-sided displaced acute rib fractures from rib 4 through rib 11 with two part fractures of rib 6 through rib 9. 4. Left-sided displaced acute rib fractures from rib 7 through rib 10 with two part fractures of ribs 7 and 8. 5. Subcutaneous emphysema and edema in the right lateral and posterior chest wall. 6. Stable cardiomegaly, coronary calcification, and centrilobular emphysema. 7. Mild nonspecific intrahepatic biliary ductal dilatation. These results were called by telephone at the time of interpretation on 06/18/2017 at 2:42 am to Dr. Dione Booze , who verbally acknowledged  these results. Electronically Signed   By: Mitzi Hansen M.D.   On: 06/18/2017 02:47   Ct Cervical Spine Wo Contrast  Result Date: 06/18/2017 CLINICAL DATA:  Status post unwitnessed fall; hit head. Decreased O2 saturation. Concern for head or cervical spine injury. Initial encounter. EXAM: CT HEAD WITHOUT CONTRAST CT CERVICAL SPINE WITHOUT CONTRAST TECHNIQUE: Multidetector CT imaging of the head and cervical spine was performed following the standard protocol without intravenous contrast. Multiplanar CT image reconstructions of the cervical spine were also generated. COMPARISON:  CT of the head and cervical spine performed 01/16/2017 FINDINGS: CT HEAD FINDINGS Brain: No evidence of acute infarction, hemorrhage, hydrocephalus, extra-axial collection or mass lesion/mass effect. Prominence of the ventricles and sulci reflects mild cortical volume loss. Diffuse periventricular and subcortical white matter change likely reflects small vessel ischemic microangiopathy. A chronic lacunar infarct is noted at the right basal ganglia. The brainstem and fourth ventricle are within normal limits. The cerebral hemispheres demonstrate grossly normal gray-white differentiation. No mass effect or midline shift is seen. Vascular: No hyperdense vessel or unexpected calcification. Skull: There is no evidence of fracture; visualized osseous structures are unremarkable in appearance. Sinuses/Orbits: The orbits are within normal limits. The paranasal sinuses and mastoid air cells are well-aerated. Other: No significant soft tissue abnormalities are seen. CT CERVICAL SPINE FINDINGS Alignment: There is mild grade 1 anterolisthesis of C7 on T1. Skull base and vertebrae: No acute fracture. No primary bone lesion or focal pathologic process. Soft tissues and spinal canal: No prevertebral fluid or swelling. No visible canal hematoma. Disc levels: Multilevel disc space narrowing is noted along the cervical spine, with underlying  facet disease. Upper chest: A trace right apical pneumothorax is noted. Bilateral emphysema is noted at the lung apices.  The thyroid gland is not well seen. Other: No additional soft tissue abnormalities are seen. IMPRESSION: 1. No evidence of traumatic intracranial injury or fracture. 2. No evidence of acute fracture or subluxation along the cervical spine. 3. Mild cortical volume loss and diffuse small vessel ischemic microangiopathy. 4. Chronic lacunar infarct at the right basal ganglia. 5. Mild diffuse degenerative change along the cervical spine. 6. Trace right apical pneumothorax. Bilateral emphysema at the lung apices. Electronically Signed   By: Roanna Raider M.D.   On: 06/18/2017 02:39   Dg Chest Portable 1 View  Result Date: 06/18/2017 CLINICAL DATA:  Left-sided chest tube placement.  Initial encounter. EXAM: PORTABLE CHEST 1 VIEW COMPARISON:  Chest radiograph performed 01/23/2017, and CT of the chest performed earlier today at 2:18 a.m. FINDINGS: There is a persistent small to moderate left-sided pleural effusion, status post placement of a left apical chest tube. Left-sided haziness raises concern for mild interstitial edema. The right lung appears grossly clear. No definite pneumothorax is identified. The cardiomediastinal silhouette is mildly enlarged. No acute osseous abnormalities are identified. IMPRESSION: 1. Persistent small to moderate left-sided pleural effusion, status post placement of left apical chest tube. 2. Left-sided haziness raises concern for mild interstitial edema. 3. Mild cardiomegaly. Electronically Signed   By: Roanna Raider M.D.   On: 06/18/2017 04:53    Anti-infectives: Anti-infectives    None      Assessment/Plan: s/p  Patient jsut admitted to the floor.  Will reassess later today.  LOS: 0 days   Marta Lamas. Gae Bon, MD, FACS [phone] Trauma Surgeon 06/18/2017

## 2017-06-18 NOTE — Progress Notes (Signed)
Patient arrived from ED at 0645. Fall and multiple left ribs fractures. Chest tube to suction in place. CCMD called and telemetry applied.

## 2017-06-18 NOTE — ED Notes (Signed)
Pt states she fell and struck the right side of her ribcage. Pt states she fell several months ago and broke ribs on her left side.

## 2017-06-18 NOTE — Progress Notes (Signed)
Pt note Evaluation completed and to be placed in chart. Will follow to assess if pt can make progress. Will need SNF.  Thanks [employer] [phone]

## 2017-06-18 NOTE — ED Provider Notes (Signed)
[facility]-EMERGENCY DEPT Provider Note   CSN: [id number] Arrival date & time: 07-09-17  2353     History   Chief Complaint Chief Complaint  Patient presents with  . Fall    HPI Jane [name] is a 83 y.o. female.  The history is provided by the patient.  She states that she has blackout spells. She had one of those spells at the facility where she resides. She bumped her head on the right side, and injured her right rib cage. She states that she can hear the ribs moving. Pain is rated at 10/10. She denies arm, leg, abdomen injury.  Past Medical History:  Diagnosis Date  . Abdominal pain, right lower quadrant   . Abdominal pain, right upper quadrant   . Abnormal involuntary movements(781.0)   . Abnormality of gait   . Anal fissure   . Anemia, unspecified   . Anxiety state, unspecified   . CAP (community acquired pneumonia) 01/31/2014  . Chest pain, unspecified   . Closed fracture of maxillary sinus, left 06/18/2015  . Closed left hip fracture (HCC) 06/25/2014  . Emphysema of lung (HCC)   . Essential and other specified forms of tremor   . Fracture of orbital floor, left, closed 06/18/2015  . Gastritis 02/13/2013  . Glaucoma    both eyes  . Humerus fracture   . Hyperlipidemia   . Hypertension   . Hyposmolality and/or hyponatremia   . Impacted cerumen   . Intestinal or peritoneal adhesions with obstruction (postoperative) (postinfection) (HCC)   . Laceration of left ear canal 04/13/2014  . Long term (current) use of anticoagulants   . Loss of weight   . Lumbago   . Macular degeneration (senile) of retina, unspecified   . Major depressive disorder, single episode, unspecified   . Osteoarthrosis, unspecified whether generalized or localized, unspecified site   . Osteoporosis, senile   . Other and unspecified hyperlipidemia   . Other pulmonary embolism and infarction   . Other vitamin B12 deficiency anemia   . Pain in limb   . Postoperative anemia due to acute blood loss  01/31/2014  . Unspecified cataract   . Unspecified essential hypertension   . Unspecified glaucoma(365.9)   . Unspecified hearing loss   . Unspecified hypothyroidism   . Vitamin B 12 deficiency 06/19/2015    Patient Active Problem List   Diagnosis Date Noted  . Chronic idiopathic constipation 04/26/2017  . Frequent falls 01/25/2017  . Multiple closed fractures of ribs of left side 01/25/2017  . Pill dysphagia 01/25/2017  . Hernia of abdominal cavity 02/21/2016  . Primary osteoarthritis of right knee 02/21/2016  . Peripheral neuropathy 02/21/2016  . Severe auditory hallucinations 02/21/2016  . Cerumen impaction 02/21/2016  . Vitamin B 12 deficiency 06/19/2015  . Fracture of orbital floor, left, closed 06/18/2015  . Closed fracture of maxillary sinus, left 06/18/2015  . Humeral fracture 06/16/2015  . Dementia 06/16/2015  . Fall at home 06/16/2015  . Humerus fracture   . Near syncope 02/27/2015  . Hyperlipidemia 02/27/2015  . Acute delirium 02/27/2015  . Vaginal yeast infection 02/27/2015  . Enterococcus UTI 02/27/2015  . Vertigo   . Hypothyroidism due to acquired atrophy of thyroid 06/11/2014  . Hair loss 06/11/2014  . Protein-calorie malnutrition, severe (HCC) 06/11/2014  . Chronic obstructive airway disease with asthma (HCC) 06/11/2014  . Vaginal irritation 06/11/2014  . Localized skin mass, lump, or swelling 06/11/2014  . Psychosis 06/04/2013  . Senile osteoporosis 02/13/2013  . Depression 02/13/2013  .  Osteoarthritis of right hip 02/13/2013  . Essential hypertension, benign 02/13/2013  . Anemia, iron deficiency 02/13/2013  . Hypothyroidism 02/13/2013  . Hiatal hernia 02/13/2013    Past Surgical History:  Procedure Laterality Date  . ABDOMINAL HYSTERECTOMY    . BACK SURGERY  2003  . BLADDER SUSPENSION  1993  . EXCISIONAL HEMORRHOIDECTOMY  1980  . INTRAMEDULLARY (IM) NAIL INTERTROCHANTERIC Right 01/27/2014   Procedure: INTRAMEDULLARY (IM) NAIL INTERTROCHANTRIC;   Surgeon: Sheral Apley, MD;  Location: [facility] OR;  Service: Orthopedics;  Laterality: Right;  . INTRAMEDULLARY (IM) NAIL INTERTROCHANTERIC Left 06/26/2014   Procedure: LEFT HIP INTRAMEDULLARY (IM) NAIL;  Surgeon: Cheral Almas, MD;  Location: [facility] OR;  Service: Orthopedics;  Laterality: Left;  . TRIGGER FINGER RELEASE  2009   Dr Teressa Senter    OB History    No data available       Home Medications    Prior to Admission medications   Medication Sig Start Date End Date Taking? Authorizing Provider  acetaminophen (TYLENOL) 325 MG tablet Take 1 tablet (325 mg total) by mouth 3 (three) times daily. 02/07/16   Reed, Tiffany L, DO  amLODipine (NORVASC) 5 MG tablet TAKE 1 TABLET BY MOUTH DAILY 11/09/16   Reed, Tiffany L, DO  calcium carbonate (TUMS EX) 750 MG chewable tablet Chew 1 tablet by mouth daily.    [provider]  calcium-vitamin D (OSCAL WITH D) 500-200 MG-UNIT per tablet Take 1 tablet by mouth daily.    [provider]  Cholecalciferol (VITAMIN D) 2000 units tablet Take one tablet daily by mouth for Vitamin D 11/25/15   Reed, Tiffany L, DO  fexofenadine (ALLEGRA) 180 MG tablet Take 180 mg by mouth daily.     [provider]  HYDROcodone-acetaminophen (NORCO/VICODIN) 5-325 MG tablet Take 1 tablet by mouth every 6 (six) hours as needed for moderate pain. . Not to exceed 3 gm of tylenol in 24 hours. To hold for sedation. 06/04/17   Reed, Tiffany L, DO  latanoprost (XALATAN) 0.005 % ophthalmic solution Place 1 drop into both eyes at bedtime. 09/14/16   Reed, Tiffany L, DO  levothyroxine (SYNTHROID, LEVOTHROID) 100 MCG tablet Take 1 tablet (100 mcg total) by mouth daily. 05/07/17   Reed, Tiffany L, DO  LORazepam (ATIVAN) 0.5 MG tablet TAKE 1 TABLET BY MOUTH EVERY NIGHT AT BEDTIME AS NEEDED 06/11/17   Reed, Tiffany L, DO  lovastatin (MEVACOR) 20 MG tablet Take 1 tablet (20 mg total) by mouth every evening. 05/31/17   Reed, Tiffany L, DO  neomycin-bacitracin-polymyxin (NEOSPORIN)  [id number] ointment Apply 1 application topically daily as needed (to right wrist and elbow for scrape).    [provider]  polyethylene glycol powder (GLYCOLAX/MIRALAX) powder DISSOLVE 17 GRAMS IN LIQUID AND DRINK DAILY 03/13/16   Reed, Tiffany L, DO  QUEtiapine (SEROQUEL) 50 MG tablet Take 1 tablet (50 mg total) by mouth 2 (two) times daily. 04/26/17   Reed, Tiffany L, DO  sennosides-docusate sodium (SENOKOT-S) 8.6-50 MG tablet Take 1 tablet by mouth at bedtime.    [provider]  sertraline (ZOLOFT) 100 MG tablet Take 1 tablet (100 mg total) by mouth daily. 04/26/17   Reed, Tiffany L, DO  Skin Protectants, Misc. (EUCERIN) cream Apply 1 application topically daily.    [provider]    Family History Family History  Problem Relation Age of Onset  . Congestive Heart Failure Mother   . Macular degeneration Mother   . Heart disease Mother   .  Cancer Sister        lung cancer  . Arthritis Daughter   . Heart disease Daughter   . Glaucoma Sister   . Cataracts Sister     Social History Social History  Substance Use Topics  . Smoking status: Former Smoker    Years: 40.00    Types: Cigarettes  . Smokeless tobacco: Never Used  . Alcohol use No     Allergies   Lyrica [pregabalin]; Celebrex [celecoxib]; Chlorzoxazone; and Fentanyl   Review of Systems Review of Systems  All other systems reviewed and are negative.    Physical Exam Updated Vital Signs BP (!) 146/66 (BP Location: Right Arm)   Pulse (!) 102   Temp 98.3 F (36.8 C) (Oral)   Resp 16   SpO2 95%   Physical Exam  Nursing note and vitals reviewed.  83 year old female, resting comfortably and in no acute distress. Vital signs are significant for mild hypertension and borderline tachycardia. Oxygen saturation is 95%, which is normal. Head is normocephalic and atraumatic. PERRLA, EOMI. Oropharynx is clear. Neck is nontender without adenopathy or JVD. Back is nontender and there is no CVA  tenderness. Lungs are clear without rales, wheezes, or rhonchi. Chest has diffuse tenderness to palpation. Right lateral ribs are grossly unstable with bony crepitus present. Heart has regular rate and rhythm without murmur. Abdomen is soft, flat, nontender without masses or hepatosplenomegaly and peristalsis is normoactive. Pelvis is stable and nontender. Extremities have no cyanosis or edema, full range of motion is present. Skin is warm and dry without rash. Neurologic: Mental status is normal, cranial nerves are intact, there are no motor or sensory deficits.  ED Treatments / Results  Labs (all labs ordered are listed, but only abnormal results are displayed) Labs Reviewed  BASIC METABOLIC PANEL - Abnormal; Notable for the following:       Result Value   Glucose, Bld 128 (*)    BUN 22 (*)    All other components within normal limits  CBC WITH DIFFERENTIAL/PLATELET - Abnormal; Notable for the following:    WBC 11.7 (*)    RBC 3.63 (*)    Hemoglobin 11.6 (*)    HCT 34.9 (*)    Neutro Abs 9.9 (*)    All other components within normal limits  HEPATIC FUNCTION PANEL    Radiology Ct Head Wo Contrast  Result Date: 06/18/2017 CLINICAL DATA:  Status post unwitnessed fall; hit head. Decreased O2 saturation. Concern for head or cervical spine injury. Initial encounter. EXAM: CT HEAD WITHOUT CONTRAST CT CERVICAL SPINE WITHOUT CONTRAST TECHNIQUE: Multidetector CT imaging of the head and cervical spine was performed following the standard protocol without intravenous contrast. Multiplanar CT image reconstructions of the cervical spine were also generated. COMPARISON:  CT of the head and cervical spine performed 01/16/2017 FINDINGS: CT HEAD FINDINGS Brain: No evidence of acute infarction, hemorrhage, hydrocephalus, extra-axial collection or mass lesion/mass effect. Prominence of the ventricles and sulci reflects mild cortical volume loss. Diffuse periventricular and subcortical white matter  change likely reflects small vessel ischemic microangiopathy. A chronic lacunar infarct is noted at the right basal ganglia. The brainstem and fourth ventricle are within normal limits. The cerebral hemispheres demonstrate grossly normal gray-white differentiation. No mass effect or midline shift is seen. Vascular: No hyperdense vessel or unexpected calcification. Skull: There is no evidence of fracture; visualized osseous structures are unremarkable in appearance. Sinuses/Orbits: The orbits are within normal limits. The paranasal sinuses and mastoid air cells are well-aerated.  Other: No significant soft tissue abnormalities are seen. CT CERVICAL SPINE FINDINGS Alignment: There is mild grade 1 anterolisthesis of C7 on T1. Skull base and vertebrae: No acute fracture. No primary bone lesion or focal pathologic process. Soft tissues and spinal canal: No prevertebral fluid or swelling. No visible canal hematoma. Disc levels: Multilevel disc space narrowing is noted along the cervical spine, with underlying facet disease. Upper chest: A trace right apical pneumothorax is noted. Bilateral emphysema is noted at the lung apices. The thyroid gland is not well seen. Other: No additional soft tissue abnormalities are seen. IMPRESSION: 1. No evidence of traumatic intracranial injury or fracture. 2. No evidence of acute fracture or subluxation along the cervical spine. 3. Mild cortical volume loss and diffuse small vessel ischemic microangiopathy. 4. Chronic lacunar infarct at the right basal ganglia. 5. Mild diffuse degenerative change along the cervical spine. 6. Trace right apical pneumothorax. Bilateral emphysema at the lung apices. Electronically Signed   By: Roanna Raider M.D.   On: 06/18/2017 02:39   Ct Chest W Contrast  Result Date: 06/18/2017 CLINICAL DATA:  83 y/o  F; multiple rib fractures. EXAM: CT CHEST WITH CONTRAST TECHNIQUE: Multidetector CT imaging of the chest was performed during intravenous contrast  administration. CONTRAST:  80mL ISOVUE-300 IOPAMIDOL (ISOVUE-300) INJECTION 61% COMPARISON:  03/25/2008 CT chest FINDINGS: Cardiovascular: Stable mild cardiomegaly. Moderate coronary artery calcification. No pleural effusion. Normal caliber thoracic aorta with calcific atherosclerosis. Normal caliber main pulmonary artery. Mediastinum/Nodes: No enlarged mediastinal, hilar, or axillary lymph nodes. Thyroid gland, trachea, and esophagus demonstrate no significant findings. Lungs/Pleura: Moderate left and trace right pleural effusion. Small right-sided pneumothorax without evidence for tension. Moderate to severe centrilobular emphysema. Dependent bilateral lower lobe atelectasis. Upper Abdomen: Mild intrahepatic biliary ductal dilatation. Musculoskeletal: Right-sided displaced acute rib fractures from rib 4 through rib 11 with 2 part fractures of rib 6 through rib 9. Left-sided displaced acute rib fractures from rib 7 through rib 10 with 2 part fractures of ribs 7 and 8. Additionally there are subacute to chronic bilateral rib fractures with callus formation. Chronic fracture deformity of the left mid humerus. IMPRESSION: 1. Small right-sided pneumothorax. 2. Trace right and moderate left pleural effusions. 3. Right-sided displaced acute rib fractures from rib 4 through rib 11 with two part fractures of rib 6 through rib 9. 4. Left-sided displaced acute rib fractures from rib 7 through rib 10 with two part fractures of ribs 7 and 8. 5. Subcutaneous emphysema and edema in the right lateral and posterior chest wall. 6. Stable cardiomegaly, coronary calcification, and centrilobular emphysema. 7. Mild nonspecific intrahepatic biliary ductal dilatation. These results were called by telephone at the time of interpretation on 06/18/2017 at 2:42 am to Dr. Dione Booze , who verbally acknowledged these results. Electronically Signed   By: Mitzi Hansen M.D.   On: 06/18/2017 02:47   Ct Cervical Spine Wo  Contrast  Result Date: 06/18/2017 CLINICAL DATA:  Status post unwitnessed fall; hit head. Decreased O2 saturation. Concern for head or cervical spine injury. Initial encounter. EXAM: CT HEAD WITHOUT CONTRAST CT CERVICAL SPINE WITHOUT CONTRAST TECHNIQUE: Multidetector CT imaging of the head and cervical spine was performed following the standard protocol without intravenous contrast. Multiplanar CT image reconstructions of the cervical spine were also generated. COMPARISON:  CT of the head and cervical spine performed 01/16/2017 FINDINGS: CT HEAD FINDINGS Brain: No evidence of acute infarction, hemorrhage, hydrocephalus, extra-axial collection or mass lesion/mass effect. Prominence of the ventricles and sulci reflects mild cortical volume  loss. Diffuse periventricular and subcortical white matter change likely reflects small vessel ischemic microangiopathy. A chronic lacunar infarct is noted at the right basal ganglia. The brainstem and fourth ventricle are within normal limits. The cerebral hemispheres demonstrate grossly normal gray-white differentiation. No mass effect or midline shift is seen. Vascular: No hyperdense vessel or unexpected calcification. Skull: There is no evidence of fracture; visualized osseous structures are unremarkable in appearance. Sinuses/Orbits: The orbits are within normal limits. The paranasal sinuses and mastoid air cells are well-aerated. Other: No significant soft tissue abnormalities are seen. CT CERVICAL SPINE FINDINGS Alignment: There is mild grade 1 anterolisthesis of C7 on T1. Skull base and vertebrae: No acute fracture. No primary bone lesion or focal pathologic process. Soft tissues and spinal canal: No prevertebral fluid or swelling. No visible canal hematoma. Disc levels: Multilevel disc space narrowing is noted along the cervical spine, with underlying facet disease. Upper chest: A trace right apical pneumothorax is noted. Bilateral emphysema is noted at the lung apices.  The thyroid gland is not well seen. Other: No additional soft tissue abnormalities are seen. IMPRESSION: 1. No evidence of traumatic intracranial injury or fracture. 2. No evidence of acute fracture or subluxation along the cervical spine. 3. Mild cortical volume loss and diffuse small vessel ischemic microangiopathy. 4. Chronic lacunar infarct at the right basal ganglia. 5. Mild diffuse degenerative change along the cervical spine. 6. Trace right apical pneumothorax. Bilateral emphysema at the lung apices. Electronically Signed   By: Roanna Raider M.D.   On: 06/18/2017 02:39    Procedures Procedures (including critical care time)  Medications Ordered in ED Medications  ondansetron (ZOFRAN) injection 4 mg (not administered)  morphine 4 MG/ML injection 4 mg (not administered)     Initial Impression / Assessment and Plan / ED Course  I have reviewed the triage vital signs and the nursing notes.  Pertinent labs & imaging results that were available during my care of the patient were reviewed by me and considered in my medical decision making (see chart for details).  Fall at home with injury to rib cage. She will be sent for CT of head and cervical spine. Because of need for repositioning to get adequate rib detail x-rays, she is sent for CT of her chest. Of note, she does come with DO NOT RESUSCITATE paperwork. Old records are reviewed, and she has several recent ED visits for falls with left-sided rib fractures noted in March.  CT scans show multiple bilateral rib fractures with small right pneumothorax. Also, some biliary ductal dilatation. We'll check hepatic function panel. Pneumovax does not require immediate chest tube. She is maintaining adequate oxygen saturations. Case is discussed with Dr. Janee Morn of trauma surgery, who agrees to admit the patient.  Final Clinical Impressions(s) / ED Diagnoses   Final diagnoses:  Fall at nursing home, initial encounter  Closed traumatic fracture  of ribs of right side with pneumothorax, initial encounter  Closed fracture of four ribs, left, initial encounter    New Prescriptions New Prescriptions   No medications on file     Dione Booze, MD 06/18/17 [phone]

## 2017-06-18 NOTE — Procedures (Signed)
Chest Tube Insertion Procedure Note  Indications:  L hemothorax  Pre-operative Diagnosis: Hemothorax  Post-operative Diagnosis: Hemothorax  Procedure Details  Informed consent was obtained for the procedure from her daughter, [name], by phone, including sedation.  Risks of lung perforation, hemorrhage, arrhythmia, and adverse drug reaction were discussed.   After sterile skin prep, using standard technique, a 14 French tube was placed in the left anterior axillary line above the nipple level using Seldinger technique. Attached to Pleur-evac.  Findings: Rush of air, small amount blood  Estimated Blood Loss:  less than 50 mL         Specimens:  None              Complications:  None; patient tolerated the procedure well.         Disposition: ED awaiting SDU         Condition: stable, CXR P  Violeta Gelinas, MD, MPH, FACS Trauma: [phone] General Surgery: [phone]

## 2017-06-19 ENCOUNTER — Inpatient Hospital Stay (HOSPITAL_COMMUNITY): Payer: Medicare Other

## 2017-06-19 LAB — BASIC METABOLIC PANEL
Anion gap: 5 (ref 5–15)
BUN: 24 mg/dL — ABNORMAL HIGH (ref 6–20)
CALCIUM: 8.8 mg/dL — AB (ref 8.9–10.3)
CO2: 29 mmol/L (ref 22–32)
CREATININE: 0.98 mg/dL (ref 0.44–1.00)
Chloride: 108 mmol/L (ref 101–111)
GFR, EST AFRICAN AMERICAN: 58 mL/min — AB (ref >60)
GFR, EST NON AFRICAN AMERICAN: 50 mL/min — AB (ref >60)
Glucose, Bld: 124 mg/dL — ABNORMAL HIGH (ref 65–99)
Potassium: 4.3 mmol/L (ref 3.5–5.1)
SODIUM: 142 mmol/L (ref 135–145)

## 2017-06-19 LAB — CBC
HEMATOCRIT: 33.6 % — AB (ref 36.0–46.0)
Hemoglobin: 10.6 g/dL — ABNORMAL LOW (ref 12.0–15.0)
MCH: 31.5 pg (ref 26.0–34.0)
MCHC: 31.5 g/dL (ref 30.0–36.0)
MCV: 99.7 fL (ref 78.0–100.0)
PLATELETS: 161 10*3/uL (ref 150–400)
RBC: 3.37 MIL/uL — ABNORMAL LOW (ref 3.87–5.11)
RDW: 14.4 % (ref 11.5–15.5)
WBC: 7.6 10*3/uL (ref 4.0–10.5)

## 2017-06-19 MED ORDER — ONDANSETRON HCL 4 MG/2ML IJ SOLN: 4.0000 mg | Freq: Four times a day (QID) | INTRAMUSCULAR | Status: DC | PRN

## 2017-06-19 MED ORDER — LORAZEPAM 2 MG/ML PO CONC: 1.0000 mg | ORAL | Status: DC | PRN

## 2017-06-19 MED ORDER — LORAZEPAM 2 MG/ML IJ SOLN
1.0000 mg | INTRAMUSCULAR | Status: DC | PRN
  Administered 2017-06-20: 1 mg via INTRAVENOUS
  Filled 2017-06-19: qty 1

## 2017-06-19 MED ORDER — GLYCOPYRROLATE 0.2 MG/ML IJ SOLN
0.2000 mg | INTRAMUSCULAR | Status: DC | PRN
  Filled 2017-06-19: qty 1

## 2017-06-19 MED ORDER — ACETAMINOPHEN 650 MG RE SUPP: 650.0000 mg | Freq: Four times a day (QID) | RECTAL | Status: DC | PRN

## 2017-06-19 MED ORDER — POLYVINYL ALCOHOL 1.4 % OP SOLN
1.0000 [drp] | Freq: Four times a day (QID) | OPHTHALMIC | Status: DC | PRN
  Filled 2017-06-19: qty 15

## 2017-06-19 MED ORDER — HYDRALAZINE HCL 20 MG/ML IJ SOLN: 10.0000 mg | INTRAMUSCULAR | Status: DC | PRN

## 2017-06-19 MED ORDER — BIOTENE DRY MOUTH MT LIQD: 15.0000 mL | OROMUCOSAL | Status: DC | PRN

## 2017-06-19 MED ORDER — HALOPERIDOL LACTATE 2 MG/ML PO CONC
0.5000 mg | ORAL | Status: DC | PRN
  Filled 2017-06-19: qty 0.3

## 2017-06-19 MED ORDER — HALOPERIDOL LACTATE 5 MG/ML IJ SOLN: 0.5000 mg | INTRAMUSCULAR | Status: DC | PRN

## 2017-06-19 MED ORDER — GLYCOPYRROLATE 1 MG PO TABS
1.0000 mg | ORAL_TABLET | ORAL | Status: DC | PRN
  Filled 2017-06-19: qty 1

## 2017-06-19 MED ORDER — LORAZEPAM 1 MG PO TABS: 1.0000 mg | ORAL_TABLET | ORAL | Status: DC | PRN

## 2017-06-19 MED ORDER — SODIUM CHLORIDE 0.9 % IV SOLN
12.5000 mg | Freq: Four times a day (QID) | INTRAVENOUS | Status: DC | PRN
  Filled 2017-06-19: qty 0.5

## 2017-06-19 MED ORDER — HYDROMORPHONE HCL 1 MG/ML IJ SOLN
0.5000 mg | INTRAMUSCULAR | Status: DC | PRN
  Administered 2017-06-19 – 2017-06-20 (×4): 0.5 mg via INTRAVENOUS
  Filled 2017-06-19 (×4): qty 0.5

## 2017-06-19 MED ORDER — ONDANSETRON 4 MG PO TBDP
4.0000 mg | ORAL_TABLET | Freq: Four times a day (QID) | ORAL | Status: DC | PRN
  Filled 2017-06-19: qty 1

## 2017-06-19 MED ORDER — HALOPERIDOL 0.5 MG PO TABS
0.5000 mg | ORAL_TABLET | ORAL | Status: DC | PRN
  Filled 2017-06-19: qty 1

## 2017-06-19 MED ORDER — DIPHENHYDRAMINE HCL 50 MG/ML IJ SOLN: 12.5000 mg | INTRAMUSCULAR | Status: DC | PRN

## 2017-06-19 MED ORDER — ACETAMINOPHEN 325 MG PO TABS: 650.0000 mg | ORAL_TABLET | Freq: Four times a day (QID) | ORAL | Status: DC | PRN

## 2017-06-19 MED ORDER — METHOCARBAMOL 1000 MG/10ML IJ SOLN
500.0000 mg | Freq: Three times a day (TID) | INTRAVENOUS | Status: DC | PRN
  Filled 2017-06-19: qty 5

## 2017-06-19 NOTE — Care Management Note (Signed)
Case Management Note  Patient Details  Name: Aaradhya Kysar MRN: [id number] Date of Birth: 1924/11/28  Subjective/Objective:  Pt admitted on 06/18/17 s/p fall at her nursing facility, sustaining bilateral rib fractures, occult right pneumothorax, moderate L Hemothorax.  PTA, pt resides at [facility].                  Action/Plan: PT/OT recommending SNF for rehab; CSW consulted to facilitate discharge to SNF upon medical stability.    Expected Discharge Date:                  Expected Discharge Plan:  Skilled Nursing Facility  In-House Referral:  Clinical Social Work  Discharge planning Services     Post Acute Care Choice:    Choice offered to:     DME Arranged:    DME Agency:     HH Arranged:    HH Agency:     Status of Service:  In process, will continue to follow  If discussed at Long Length of Stay Meetings, dates discussed:    Additional Comments:  Quintella Baton, RN, BSN  Trauma/Neuro ICU Case Manager [phone]

## 2017-06-19 NOTE — Evaluation (Signed)
Occupational Therapy Evaluation Patient Details Name: Jane [name] MRN: [id number] DOB: [birth year] Today's Date: 06/19/2017    History of Present Illness Jane [name] is a resident at a facility. She fell while trying to hold on to a safety bar. She immediately had bilateral rib pain. The pain is exacerbated by deep breaths. She does have dementia and is DO NOT RESUSCITATE. However, she is able to communicate somewhat. She was sent to the emergency department for further evaluation. CT chest shows bilateral rib fractures, occult right pneumothorax, moderate L Hemothorax.    Clinical Impression   Pt. Is a long term patient at a SNF. Per patient they were assisting with all ADLs. Pt. Further OT needs can be addressed at SNF. Pt. Was not sat up secondary to her HR increased and O2 sats dropped into the mid 80s. Pt. Nurse was informed. No further acute OT at this time.     Follow Up Recommendations  SNF    Equipment Recommendations       Recommendations for Other Services       Precautions / Restrictions Precautions Precautions: Fall Precaution Comments: chest tube left side Restrictions Weight Bearing Restrictions: No      Mobility Bed Mobility               General bed mobility comments: Pt. o2 decreased and hr increased and did not attempt to sit pt up  Transfers                 General transfer comment: unable secondary to o2 sats and hr    Balance                                           ADL either performed or assessed with clinical judgement   ADL Overall ADL's : At baseline                                             Vision Baseline Vision/History: Wears glasses Wears Glasses: At all times       Perception     Praxis      Pertinent Vitals/Pain Pain Assessment: 0-10 Pain Score: 4  Pain Location:  (rib) Pain Descriptors / Indicators: Aching Pain Intervention(s): Limited activity within patient's tolerance      Hand Dominance Right   Extremity/Trunk Assessment Upper Extremity Assessment Upper Extremity Assessment:  (R UE limited) RUE Deficits / Details: could not raise arm as it caused rib pain on right side.  LUE Deficits / Details: grossly 3-/5           Communication Communication Communication: HOH   Cognition Arousal/Alertness: Awake/alert Behavior During Therapy: WFL for tasks assessed/performed Overall Cognitive Status: History of cognitive impairments - at baseline                                     General Comments       Exercises     Shoulder Instructions      Home Living Family/patient expects to be discharged to:: Skilled nursing facility  Prior Functioning/Environment Level of Independence: Needs assistance  Gait / Transfers Assistance Needed: A by staff  at SNF ADL's / Homemaking Assistance Needed: A by staff at SNF            OT Problem List:        OT Treatment/Interventions:      OT Goals(Current goals can be found in the care plan section) Acute Rehab OT Goals Patient Stated Goal: to feel better  OT Frequency:     Barriers to D/C:            Co-evaluation              AM-PAC PT "6 Clicks" Daily Activity     Outcome Measure Help from another person eating meals?: A Little Help from another person taking care of personal grooming?: A Lot Help from another person toileting, which includes using toliet, bedpan, or urinal?: Total Help from another person bathing (including washing, rinsing, drying)?: A Lot Help from another person to put on and taking off regular upper body clothing?: A Lot Help from another person to put on and taking off regular lower body clothing?: Total 6 Click Score: 11   End of Session Nurse Communication:  (called patient nurse to inform her of patient vitals)  Activity Tolerance: Treatment limited secondary to medical complications  (Comment) Patient left: in bed;with call bell/phone within reach                   Time: 0815-0838 OT Time Calculation (min): 23 min Charges:  OT General Charges $OT Visit: 1 Procedure OT Evaluation $OT Eval Low Complexity: 1 Procedure G-Codes:    6 clicks score of 11  Jane [name] 06/19/2017, 8:40 AM

## 2017-06-19 NOTE — [place] FL2 (Signed)
[place] MEDICAID FL2 LEVEL OF CARE SCREENING TOOL     IDENTIFICATION  Patient Name: Jane [name] Birthdate: 1925-02-28 Sex: female Admission Date (Current Location): 06/23/17  [facility] and [place] Number:  Producer, television/film/video and Address:  [facility] [place] , [address], [place], [place] [address]      Provider Number: [id number]  Attending Physician Name and Address:  Md, Trauma, MD  Relative Name and Phone Number:       Current Level of Care: Hospital Recommended Level of Care: Skilled Nursing Facility Prior Approval Number:    Date Approved/Denied:   PASRR Number: [id number]  Discharge Plan: SNF    Current Diagnoses: Patient Active Problem List   Diagnosis Date Noted  . Multiple fractures of ribs, bilateral, initial encounter for closed fracture 06/18/2017  . Chronic idiopathic constipation 04/26/2017  . Frequent falls 01/25/2017  . Multiple closed fractures of ribs of left side 01/25/2017  . Pill dysphagia 01/25/2017  . Hernia of abdominal cavity 02/21/2016  . Primary osteoarthritis of right knee 02/21/2016  . Peripheral neuropathy 02/21/2016  . Severe auditory hallucinations 02/21/2016  . Cerumen impaction 02/21/2016  . Vitamin B 12 deficiency 06/19/2015  . Fracture of orbital floor, left, closed 06/18/2015  . Closed fracture of maxillary sinus, left 06/18/2015  . Humeral fracture 06/16/2015  . Dementia 06/16/2015  . Fall at home 06/16/2015  . Humerus fracture   . Near syncope 02/27/2015  . Hyperlipidemia 02/27/2015  . Acute delirium 02/27/2015  . Vaginal yeast infection 02/27/2015  . Enterococcus UTI 02/27/2015  . Vertigo   . Hypothyroidism due to acquired atrophy of thyroid 06/11/2014  . Hair loss 06/11/2014  . Protein-calorie malnutrition, severe (HCC) 06/11/2014  . Chronic obstructive airway disease with asthma (HCC) 06/11/2014  . Vaginal irritation 06/11/2014  . Localized skin mass, lump, or swelling 06/11/2014  .  Psychosis 06/04/2013  . Senile osteoporosis 02/13/2013  . Depression 02/13/2013  . Osteoarthritis of right hip 02/13/2013  . Essential hypertension, benign 02/13/2013  . Anemia, iron deficiency 02/13/2013  . Hypothyroidism 02/13/2013  . Hiatal hernia 02/13/2013    Orientation RESPIRATION BLADDER Height & Weight     Self, Time, Situation  O2 (Nasal Cannula) Incontinent Weight: 119 lb 4.8 oz (54.1 kg) Height:  5\' 3"  (160 cm)  BEHAVIORAL SYMPTOMS/MOOD NEUROLOGICAL BOWEL NUTRITION STATUS      Incontinent  (Please see d/c summary)  AMBULATORY STATUS COMMUNICATION OF NEEDS Skin   Extensive Assist Verbally Normal                       Personal Care Assistance Level of Assistance  Bathing, Feeding, Dressing Bathing Assistance: Maximum assistance Feeding assistance: Limited assistance Dressing Assistance: Maximum assistance     Functional Limitations Info  Sight, Hearing, Speech Sight Info: Adequate Hearing Info: Adequate Speech Info: Adequate    SPECIAL CARE FACTORS FREQUENCY  PT (By licensed PT), OT (By licensed OT)     PT Frequency: 2x week OT Frequency: 2x week            Contractures Contractures Info: Not present    Additional Factors Info  Code Status, Allergies Code Status Info: DNR Allergies Info: Lyrica Pregabalin, Celebrex Celecoxib, Chlorzoxazone, Fentanyl           Current Medications (06/19/2017):  This is the current hospital active medication list Current Facility-Administered Medications  Medication Dose Route Frequency Provider Last Rate Last Dose  . 0.9 % NaCl with KCl 20 mEq/ L  infusion   Intravenous Continuous Violeta Gelinas, MD 10 mL/hr at 06/18/17 1800    . acetaminophen (TYLENOL) tablet 650 mg  650 mg Oral Q6H PRN Violeta Gelinas, MD      . amLODipine (NORVASC) tablet 5 mg  5 mg Oral Daily Violeta Gelinas, MD   5 mg at 06/18/17 0950  . calcium carbonate (TUMS - dosed in mg elemental calcium) chewable tablet 300 mg of elemental calcium   1.5 tablet Oral Daily Violeta Gelinas, MD   300 mg of elemental calcium at 06/18/17 0950  . HYDROcodone-acetaminophen (NORCO/VICODIN) 5-325 MG per tablet 1 tablet  1 tablet Oral Q6H PRN Violeta Gelinas, MD   1 tablet at 06/18/17 1342  . latanoprost (XALATAN) 0.005 % ophthalmic solution 1 drop  1 drop Both Eyes QHS Violeta Gelinas, MD   1 drop at 06/18/17 2230  . levothyroxine (SYNTHROID, LEVOTHROID) tablet 100 mcg  100 mcg Oral QAC breakfast Violeta Gelinas, MD   100 mcg at 06/18/17 0949  . methocarbamol (ROBAXIN) tablet 500 mg  500 mg Oral TID Violeta Gelinas, MD   500 mg at 06/18/17 2230  . morphine 2 MG/ML injection 2-4 mg  2-4 mg Intravenous Q2H PRN Violeta Gelinas, MD   4 mg at 06/19/17 0650  . QUEtiapine (SEROQUEL) tablet 50 mg  50 mg Oral BID Violeta Gelinas, MD   50 mg at 06/18/17 2313  . senna-docusate (Senokot-S) tablet 1 tablet  1 tablet Oral Daily Violeta Gelinas, MD   1 tablet at 06/18/17 [phone]  . sertraline (ZOLOFT) tablet 100 mg  100 mg Oral Daily Violeta Gelinas, MD   100 mg at 06/18/17 0951  . traMADol (ULTRAM) tablet 50 mg  50 mg Oral Q6H Violeta Gelinas, MD   50 mg at 06/19/17 2694  . zolpidem (AMBIEN) tablet 5 mg  5 mg Oral QHS PRN Violeta Gelinas, MD         Discharge Medications: Please see discharge summary for a list of discharge medications.  Relevant Imaging Results:  Relevant Lab Results:   Additional Information SSN: [id number]  Reuel Boom Samia Kukla, LCSW

## 2017-06-19 NOTE — Progress Notes (Signed)
Subjective: CC delirium  Objective: Vital signs in last 24 hours: Temp:  [98.4 F (36.9 C)-99 F (37.2 C)] 98.4 F (36.9 C) (08/14 0409) Pulse Rate:  [90-105] 105 (08/14 0943) Resp:  [17-24] 22 (08/14 0943) BP: (117-129)/(53-75) 129/61 (08/14 0943) SpO2:  [89 %-96 %] 95 % (08/14 0943) Last BM Date:  (pta)  Intake/Output from previous day: 08/13 0701 - 08/14 0700 In: 421.7 [P.O.:340; I.V.:81.7] Out: 50 [Chest Tube:50] Intake/Output this shift: No intake/output data recorded.  General appearance: delirious Resp: rhonchi bilaterally Chest wall: right sided chest wall tenderness, left sided chest wall tenderness Cardio: regular rate and rhythm GI: soft, non-tender; bowel sounds normal; no masses,  no organomegaly Extremities: no edema  Lab Results: CBC   Recent Labs  06/18/17 0030 06/19/17 0206  WBC 11.7* 7.6  HGB 11.6* 10.6*  HCT 34.9* 33.6*  PLT 171 161   BMET  Recent Labs  06/18/17 0030 06/19/17 0206  NA 140 142  K 3.9 4.3  CL 106 108  CO2 24 29  GLUCOSE 128* 124*  BUN 22* 24*  CREATININE 0.76 0.98  CALCIUM 9.3 8.8*   PT/INR No results for input(s): LABPROT, INR in the last 72 hours. ABG No results for input(s): PHART, HCO3 in the last 72 hours.  Invalid input(s): PCO2, PO2  Studies/Results: Ct Head Wo Contrast  Result Date: 06/18/2017 CLINICAL DATA:  Status post unwitnessed fall; hit head. Decreased O2 saturation. Concern for head or cervical spine injury. Initial encounter. EXAM: CT HEAD WITHOUT CONTRAST CT CERVICAL SPINE WITHOUT CONTRAST TECHNIQUE: Multidetector CT imaging of the head and cervical spine was performed following the standard protocol without intravenous contrast. Multiplanar CT image reconstructions of the cervical spine were also generated. COMPARISON:  CT of the head and cervical spine performed 01/16/2017 FINDINGS: CT HEAD FINDINGS Brain: No evidence of acute infarction, hemorrhage, hydrocephalus, extra-axial collection or mass  lesion/mass effect. Prominence of the ventricles and sulci reflects mild cortical volume loss. Diffuse periventricular and subcortical white matter change likely reflects small vessel ischemic microangiopathy. A chronic lacunar infarct is noted at the right basal ganglia. The brainstem and fourth ventricle are within normal limits. The cerebral hemispheres demonstrate grossly normal gray-white differentiation. No mass effect or midline shift is seen. Vascular: No hyperdense vessel or unexpected calcification. Skull: There is no evidence of fracture; visualized osseous structures are unremarkable in appearance. Sinuses/Orbits: The orbits are within normal limits. The paranasal sinuses and mastoid air cells are well-aerated. Other: No significant soft tissue abnormalities are seen. CT CERVICAL SPINE FINDINGS Alignment: There is mild grade 1 anterolisthesis of C7 on T1. Skull base and vertebrae: No acute fracture. No primary bone lesion or focal pathologic process. Soft tissues and spinal canal: No prevertebral fluid or swelling. No visible canal hematoma. Disc levels: Multilevel disc space narrowing is noted along the cervical spine, with underlying facet disease. Upper chest: A trace right apical pneumothorax is noted. Bilateral emphysema is noted at the lung apices. The thyroid gland is not well seen. Other: No additional soft tissue abnormalities are seen. IMPRESSION: 1. No evidence of traumatic intracranial injury or fracture. 2. No evidence of acute fracture or subluxation along the cervical spine. 3. Mild cortical volume loss and diffuse small vessel ischemic microangiopathy. 4. Chronic lacunar infarct at the right basal ganglia. 5. Mild diffuse degenerative change along the cervical spine. 6. Trace right apical pneumothorax. Bilateral emphysema at the lung apices. Electronically Signed   By: Roanna Raider M.D.   On: 06/18/2017 02:39  Ct Chest W Contrast  Result Date: 06/18/2017 CLINICAL DATA:  83 y/o  F;  multiple rib fractures. EXAM: CT CHEST WITH CONTRAST TECHNIQUE: Multidetector CT imaging of the chest was performed during intravenous contrast administration. CONTRAST:  74mL ISOVUE-300 IOPAMIDOL (ISOVUE-300) INJECTION 61% COMPARISON:  03/25/2008 CT chest FINDINGS: Cardiovascular: Stable mild cardiomegaly. Moderate coronary artery calcification. No pleural effusion. Normal caliber thoracic aorta with calcific atherosclerosis. Normal caliber main pulmonary artery. Mediastinum/Nodes: No enlarged mediastinal, hilar, or axillary lymph nodes. Thyroid gland, trachea, and esophagus demonstrate no significant findings. Lungs/Pleura: Moderate left and trace right pleural effusion. Small right-sided pneumothorax without evidence for tension. Moderate to severe centrilobular emphysema. Dependent bilateral lower lobe atelectasis. Upper Abdomen: Mild intrahepatic biliary ductal dilatation. Musculoskeletal: Right-sided displaced acute rib fractures from rib 4 through rib 11 with 2 part fractures of rib 6 through rib 9. Left-sided displaced acute rib fractures from rib 7 through rib 10 with 2 part fractures of ribs 7 and 8. Additionally there are subacute to chronic bilateral rib fractures with callus formation. Chronic fracture deformity of the left mid humerus. IMPRESSION: 1. Small right-sided pneumothorax. 2. Trace right and moderate left pleural effusions. 3. Right-sided displaced acute rib fractures from rib 4 through rib 11 with two part fractures of rib 6 through rib 9. 4. Left-sided displaced acute rib fractures from rib 7 through rib 10 with two part fractures of ribs 7 and 8. 5. Subcutaneous emphysema and edema in the right lateral and posterior chest wall. 6. Stable cardiomegaly, coronary calcification, and centrilobular emphysema. 7. Mild nonspecific intrahepatic biliary ductal dilatation. These results were called by telephone at the time of interpretation on 06/18/2017 at 2:42 am to Dr. Dione Booze , who verbally  acknowledged these results. Electronically Signed   By: Mitzi Hansen M.D.   On: 06/18/2017 02:47   Ct Cervical Spine Wo Contrast  Result Date: 06/18/2017 CLINICAL DATA:  Status post unwitnessed fall; hit head. Decreased O2 saturation. Concern for head or cervical spine injury. Initial encounter. EXAM: CT HEAD WITHOUT CONTRAST CT CERVICAL SPINE WITHOUT CONTRAST TECHNIQUE: Multidetector CT imaging of the head and cervical spine was performed following the standard protocol without intravenous contrast. Multiplanar CT image reconstructions of the cervical spine were also generated. COMPARISON:  CT of the head and cervical spine performed 01/16/2017 FINDINGS: CT HEAD FINDINGS Brain: No evidence of acute infarction, hemorrhage, hydrocephalus, extra-axial collection or mass lesion/mass effect. Prominence of the ventricles and sulci reflects mild cortical volume loss. Diffuse periventricular and subcortical white matter change likely reflects small vessel ischemic microangiopathy. A chronic lacunar infarct is noted at the right basal ganglia. The brainstem and fourth ventricle are within normal limits. The cerebral hemispheres demonstrate grossly normal gray-white differentiation. No mass effect or midline shift is seen. Vascular: No hyperdense vessel or unexpected calcification. Skull: There is no evidence of fracture; visualized osseous structures are unremarkable in appearance. Sinuses/Orbits: The orbits are within normal limits. The paranasal sinuses and mastoid air cells are well-aerated. Other: No significant soft tissue abnormalities are seen. CT CERVICAL SPINE FINDINGS Alignment: There is mild grade 1 anterolisthesis of C7 on T1. Skull base and vertebrae: No acute fracture. No primary bone lesion or focal pathologic process. Soft tissues and spinal canal: No prevertebral fluid or swelling. No visible canal hematoma. Disc levels: Multilevel disc space narrowing is noted along the cervical spine, with  underlying facet disease. Upper chest: A trace right apical pneumothorax is noted. Bilateral emphysema is noted at the lung apices. The thyroid gland is not  well seen. Other: No additional soft tissue abnormalities are seen. IMPRESSION: 1. No evidence of traumatic intracranial injury or fracture. 2. No evidence of acute fracture or subluxation along the cervical spine. 3. Mild cortical volume loss and diffuse small vessel ischemic microangiopathy. 4. Chronic lacunar infarct at the right basal ganglia. 5. Mild diffuse degenerative change along the cervical spine. 6. Trace right apical pneumothorax. Bilateral emphysema at the lung apices. Electronically Signed   By: Roanna Raider M.D.   On: 06/18/2017 02:39   Dg Chest Port 1 View  Result Date: 06/19/2017 CLINICAL DATA:  83 year old female with multiple rib fractures. Subsequent encounter. EXAM: PORTABLE CHEST 1 VIEW COMPARISON:  06/18/2017. FINDINGS: Multiple left-sided rib fractures. Left-sided chest tube is in place with kink at the level of the lateral aspect of the left thorax. This is a change from the prior examination. Left-sided pleural effusion without evidence of left-sided pneumothorax or hydropneumothorax. Consolidation left base may be related to the pleural fluid but cannot exclude underlying atelectasis, infiltrate or mass. Mediastinal and cardiac silhouette stable.  Calcified aorta. Probable skin fold right lung apex. Given the right-sided rib fractures, anteriorly located pneumothorax not entirely excluded. Right base subsegmental atelectasis. Small amount of subcutaneous emphysema lower right chest wall. Left humerus fracture appears remote. Scoliosis thoracic spine. IMPRESSION: When compared to the prior examination, there is now a kink within the left-sided chest tube as it crosses the left lateral chest. Left pleural effusion without left-sided pneumothorax hydropneumothorax. Probable skin fold right lung apex rather than pneumothorax.  Attention to this on follow-up. Remainder of findings unchanged. Aortic Atherosclerosis (ICD10-I70.0). Electronically Signed   By: Lacy Duverney M.D.   On: 06/19/2017 06:50   Dg Chest Portable 1 View  Result Date: 06/18/2017 CLINICAL DATA:  Left-sided chest tube placement.  Initial encounter. EXAM: PORTABLE CHEST 1 VIEW COMPARISON:  Chest radiograph performed 01/23/2017, and CT of the chest performed earlier today at 2:18 a.m. FINDINGS: There is a persistent small to moderate left-sided pleural effusion, status post placement of a left apical chest tube. Left-sided haziness raises concern for mild interstitial edema. The right lung appears grossly clear. No definite pneumothorax is identified. The cardiomediastinal silhouette is mildly enlarged. No acute osseous abnormalities are identified. IMPRESSION: 1. Persistent small to moderate left-sided pleural effusion, status post placement of left apical chest tube. 2. Left-sided haziness raises concern for mild interstitial edema. 3. Mild cardiomegaly. Electronically Signed   By: Roanna Raider M.D.   On: 06/18/2017 04:53    Anti-infectives: Anti-infectives    None      Assessment/Plan: Fall Bilateral rib fractures with occult right pneumothorax and moderate left hemothorax - CT kinked at dressing - changed and some more SS output returned.  Hypoxic resp failure - due to above, much worse. NT suction, pulm toilet as able, NRB O2 DNR VTE - PAS as Hb dropped Dispo - I called her daughter, [name] and explained her deterioration. I advised her this may well be a terminal event. She confirmed DNR/DNI/no feeding tubes.    LOS: 1 day    Violeta Gelinas, MD, MPH, FACS Trauma: [phone] General Surgery: [phone]  8/14/2018Patient ID: Jane [name], female   DOB: [birth year], 83 y.o.   MRN: [id number]

## 2017-06-19 NOTE — Progress Notes (Signed)
Patient ID: Jane [name], female   DOB: 11/18/1924, 83 y.o.   MRN: [id number] Patient with more pain and decreasing sats.  Working to breath on exam. She is in pain right now.She cannot really swallow oral pain medication. I think she is in process of dying right now. She is dnr/dni at this point. I have discussed with her daughter [name] over the phone that I think her condition is worsened. I have confirmed that she does not want care escalated. She very much would like her comfortable though. I have written for symptom mgt and will start with iv pain meds prn. If this does not work we can consider continuous infusion. I have discussed with her daughter I think likely she will pass and we will work to keep her comfortable. She is in agreement with this plan. I have stopped most of po medications also.

## 2017-06-19 NOTE — Progress Notes (Signed)
RT Note:  Patient placed on NRB per MD request. Vitals are stable, patient is resting comfortably, RT will continue to monitor.

## 2017-06-19 NOTE — Progress Notes (Signed)
Physical Therapy Treatment Patient Details Name: Jane [name] MRN: [id number] DOB: 07/27/1925 Today's Date: 06/19/2017    History of Present Illness Jane [name] is a resident at a facility. She fell while trying to hold on to a safety bar. She immediately had bilateral rib pain. The pain is exacerbated by deep breaths. She does have dementia and is DO NOT RESUSCITATE. However, she is able to communicate somewhat. She was sent to the emergency department for further evaluation. CT chest shows bilateral rib fractures, occult right pneumothorax, moderate L Hemothorax.     PT Comments    Patient limited by pain and required max/total A for bed mobility and sitting EOB very briefly. Continue to progress as tolerated.   Follow Up Recommendations  SNF;Supervision/Assistance - 24 hour     Equipment Recommendations  None recommended by PT    Recommendations for Other Services       Precautions / Restrictions Precautions Precautions: Fall Precaution Comments: chest tube left side Restrictions Weight Bearing Restrictions: No    Mobility  Bed Mobility Overal bed mobility: Needs Assistance Bed Mobility: Supine to Sit;Sit to Supine     Supine to sit: +2 for physical assistance;Max assist Sit to supine: Total assist;+2 for physical assistance   General bed mobility comments: assist for all aspects of bed mobility; pt assisted minimally in bringing bilat LE toward EOB  Transfers                 General transfer comment: unable to attempt  Ambulation/Gait                 Stairs            Wheelchair Mobility    Modified Rankin (Stroke Patients Only)       Balance Overall balance assessment: Needs assistance   Sitting balance-Leahy Scale: Zero                                      Cognition Arousal/Alertness: Awake/alert Behavior During Therapy: WFL for tasks assessed/performed Overall Cognitive Status: History of cognitive impairments - at  baseline                                        Exercises General Exercises - Lower Extremity Ankle Circles/Pumps: Both;10 reps;Supine;AAROM Heel Slides: Both;10 reps;Supine;AAROM    General Comments        Pertinent Vitals/Pain Pain Assessment: Faces Faces Pain Scale: Hurts whole lot Pain Location: unspecified; pain with movement Pain Descriptors / Indicators: Grimacing;Guarding;Moaning Pain Intervention(s): Limited activity within patient's tolerance;Monitored during session;Premedicated before session;Repositioned    Home Living                      Prior Function            PT Goals (current goals can now be found in the care plan section) Acute Rehab PT Goals Patient Stated Goal: to get better PT Goal Formulation: With patient Time For Goal Achievement: 07/02/17 Potential to Achieve Goals: Good Progress towards PT goals: Not progressing toward goals - comment    Frequency    Min 2X/week      PT Plan Current plan remains appropriate    Co-evaluation              AM-PAC PT "6 Clicks" Daily  Activity  Outcome Measure  Difficulty turning over in bed (including adjusting bedclothes, sheets and blankets)?: Total Difficulty moving from lying on back to sitting on the side of the bed? : Total Difficulty sitting down on and standing up from a chair with arms (e.g., wheelchair, bedside commode, etc,.)?: Total Help needed moving to and from a bed to chair (including a wheelchair)?: Total Help needed walking in hospital room?: Total Help needed climbing 3-5 steps with a railing? : Total 6 Click Score: 6    End of Session Equipment Utilized During Treatment: Oxygen Activity Tolerance: Patient limited by pain Patient left: in bed;with call bell/phone within reach;with bed alarm set Nurse Communication: Mobility status;Patient requests pain meds PT Visit Diagnosis: Unsteadiness on feet (R26.81);Muscle weakness (generalized) (M62.81)      Time: 1510-1530 PT Time Calculation (min) (ACUTE ONLY): 20 min  Charges:  $Therapeutic Activity: 8-22 mins                    G Codes:       Erline Levine, PTA Pager: [phone]     Carolynne Edouard 06/19/2017, 5:06 PM

## 2017-06-20 ENCOUNTER — Inpatient Hospital Stay (HOSPITAL_COMMUNITY): Payer: Medicare Other

## 2017-06-20 DIAGNOSIS — Z515 Encounter for palliative care: Secondary | ICD-10-CM

## 2017-06-20 DIAGNOSIS — W19XXXA Unspecified fall, initial encounter: Secondary | ICD-10-CM

## 2017-06-20 DIAGNOSIS — J939 Pneumothorax, unspecified: Secondary | ICD-10-CM

## 2017-06-20 DIAGNOSIS — Z7189 Other specified counseling: Secondary | ICD-10-CM

## 2017-06-20 DIAGNOSIS — J942 Hemothorax: Secondary | ICD-10-CM

## 2017-06-20 DIAGNOSIS — S2243XS Multiple fractures of ribs, bilateral, sequela: Secondary | ICD-10-CM

## 2017-06-20 DIAGNOSIS — F0150 Vascular dementia without behavioral disturbance: Secondary | ICD-10-CM

## 2017-06-20 DIAGNOSIS — Y92129 Unspecified place in nursing home as the place of occurrence of the external cause: Secondary | ICD-10-CM

## 2017-06-20 MED ORDER — MORPHINE BOLUS VIA INFUSION
2.0000 mg | INTRAVENOUS | Status: DC | PRN
  Administered 2017-06-20 – 2017-06-21 (×4): 2 mg via INTRAVENOUS
  Filled 2017-06-20: qty 2

## 2017-06-20 MED ORDER — SODIUM CHLORIDE 0.9 % IV SOLN
1.0000 mg/h | INTRAVENOUS | Status: DC
  Administered 2017-06-20: 1 mg/h via INTRAVENOUS
  Filled 2017-06-20: qty 10

## 2017-06-20 MED ORDER — LEVOTHYROXINE SODIUM 100 MCG IV SOLR
50.0000 ug | Freq: Every day | INTRAVENOUS | Status: DC
  Administered 2017-06-20 – 2017-06-21 (×2): 50 ug via INTRAVENOUS
  Filled 2017-06-20 (×3): qty 5

## 2017-06-20 MED ORDER — MORPHINE SULFATE (PF) 2 MG/ML IV SOLN: 1.0000 mg | INTRAVENOUS | Status: DC

## 2017-06-20 MED ORDER — BISACODYL 10 MG RE SUPP: 10.0000 mg | Freq: Every day | RECTAL | Status: DC | PRN

## 2017-06-20 MED ORDER — GLYCOPYRROLATE 0.2 MG/ML IJ SOLN
0.2000 mg | INTRAMUSCULAR | Status: DC
  Administered 2017-06-20 – 2017-06-21 (×7): 0.2 mg via INTRAVENOUS
  Filled 2017-06-20 (×9): qty 1

## 2017-06-20 NOTE — Progress Notes (Signed)
Palliative Medicine RN Note: afternoon follow up at the request of Murrell Converse. Pt is in bed, resting without signs of pain or agitation (no moaning, fidgeting, squirming, grimacing). NRB on. She does not respond to me calling her name or moving in front of her. Updated Sarah; plan on PMT follow up tomorrow.  Margret Chance Inita Uram, RN, BSN, [facility] 06/20/2017 3:37 PM Cell [phone] 8:00-4:00 Monday-Friday Office [phone]

## 2017-06-20 NOTE — Progress Notes (Signed)
Patient's daughter and POA refused a low bed saying that the patient was in so much pain and would not want to have her moved to another bed, education was completed on the need to have patient on the low bed, she verbalized understanding, will order a floor mat, activate the bed alarm, put bed in the lowest position and continue to monitor patient

## 2017-06-20 NOTE — Care Management Note (Signed)
Case Management Note  Patient Details  Name: Jane [name] MRN: [id number] Date of Birth: 10/11/1925  Subjective/Objective:  Pt admitted on 06/18/17 s/p fall at her nursing facility, sustaining bilateral rib fractures, occult right pneumothorax, moderate L Hemothorax.  PTA, pt resides at [facility].                  Action/Plan: PT/OT recommending SNF for rehab; CSW consulted to facilitate discharge to SNF upon medical stability.    Expected Discharge Date:                  Expected Discharge Plan:  Hospice Medical Facility  In-House Referral:  Clinical Social Work  Discharge planning Services  CM Consult  Post Acute Care Choice:    Choice offered to:     DME Arranged:    DME Agency:     HH Arranged:    HH Agency:     Status of Service:  In process, will continue to follow  If discussed at Long Length of Stay Meetings, dates discussed:    Additional Comments:  06/20/17 J. Jenilee Franey, RN, BSN Pt has declined since admission, unable to swallow and having significant pain.  Palliative care team consulted for goals of care; family prefers residential hospice facility.  CSW notified to facilitate dc to [facility], preferably [facility] on 2017/07/10.     Quintella Baton, RN, BSN  Trauma/Neuro ICU Case Manager [phone]

## 2017-06-20 NOTE — Consult Note (Signed)
Consultation Note Date: 06/20/2017   Patient Name: Jane [name]  DOB: 06-27-1925  MRN: [id number]  Age / Sex: 83 y.o., female  PCP: Kermit Balo, DO Referring Physician: Md, Trauma, MD  Reason for Consultation: Disposition, Establishing goals of care, Pain control and Psychosocial/spiritual support  HPI/Patient Profile: 83 y.o. female  with past medical history of vascular dementia with hallucinations and progressive dysphagia, recurrent falls with injury, hypothyroidism, osteoporosis, and depression. She now presents after a fall at her SNF. She was admitted on 2017/06/26 . Work-up on admission revealed bilateral rib fractures, occult right pneumothorax, and moderate left hemothorax. Left sided chest tube placed in the ED. Given her dementia and frailty, strong concern for poor prognosis. Pt has declined since admission and is now unable to tolerate oral intake and continues to struggle with ongoing significant pain. Palliative consulted for goals of care and likely symptom management for end of life.   Clinical Assessment and Goals of Care: Jane [name] is unable to meaningfully participate in a goals of care conversation. On my assessment she could briefly socially interact, but would then tire and fall asleep. She was oriented to self only. I spoke with her daughter/HCPOA, [name], over the phone.   [name] relates a progressive decline and increasingly fragility. Her mother has had increasing falls with significant injury. After the fall she would have a period of recovery, but her baseline functional status has been decreasing. Concurrent with her falls, her cognition has been more waxing and waning, and periods of agitation have occurred. [name] has an excellent understanding that her mother has been going through a period of decline, and her presentation now is another example of this. [name] explains that the focus should be on comfort and quality of life. She  understands her mother is likely at the end of her life, and she wants to ensure all measures are taken to support her in comfort.   In alignment with her goal I talked through a comfort focused approach to care. I talked through medication adjustment for symptom management and stopping blood draws and excessive vitals monitoring (stopping telemetry). Furthermore, I offered consideration of transitioning to residential hospice. [name] was very amenable to all of these care changes. She asks that her mother's pain be aggressively managed and under good control prior to considering transitioning out of the hospital. I think this is reasonable and can be achieved.   Of note, I do think Mrs. Knisley would be appropriate for residential hospice. She is not eating or drinking. She has significant pain from her recent fall with multiple fractures and right pneumothorax and left hemothorax. On top of these acute issues, she also has vascular dementia with documented decline with increased falls with injury and progressive dysphagia.   Primary Decision [employer], daughter [name].   SUMMARY OF RECOMMENDATIONS    DNR, transition to full comfort care  Medications and orders adjusted. No further lab work.   Will defer to trauma/surgery on palliative benefit of chest tube  SW consulted for residential hospice referral. [name] amenable to transfer if her mother is stable enough for transfer and her pain is under better control. Plan to re-evaluate tomorrow to determine suitability for transitioning out of the hospital.   Code Status/Advance Care Planning:  DNR  Symptom Management:   Pain: Morphine drip, PRN boluses ordered for breakthrough pain  Anxiety: Haldol PRN  Secretions: Scheduled Robinul  Nausea: Zofran PRN  Palliative Prophylaxis:   Frequent Pain Assessment and Oral Care  Additional  Recommendations (Limitations, Scope, Preferences):  Full Comfort Care  Psycho-social/Spiritual:    Desire for further Chaplaincy support:no  Additional Recommendations: Education on Hospice  Prognosis:   < 2 weeks. Pt no longer eating or drinking in the context of progressive vascular dementia now with acute injury with significant pain.   Discharge Planning: Hospice facility      Primary Diagnoses: Present on Admission: . Multiple fractures of ribs, bilateral, initial encounter for closed fracture   I have reviewed the medical record, interviewed the patient and family, and examined the patient. The following aspects are pertinent.  Past Medical History:  Diagnosis Date  . Abdominal pain, right lower quadrant   . Abdominal pain, right upper quadrant   . Abnormal involuntary movements(781.0)   . Abnormality of gait   . Anal fissure   . Anemia, unspecified   . Anxiety state, unspecified   . CAP (community acquired pneumonia) 01/31/2014  . Chest pain, unspecified   . Closed fracture of maxillary sinus, left 06/18/2015  . Closed left hip fracture (HCC) 06/25/2014  . Emphysema of lung (HCC)   . Essential and other specified forms of tremor   . Fracture of orbital floor, left, closed 06/18/2015  . Gastritis 02/13/2013  . Glaucoma    both eyes  . Humerus fracture   . Hyperlipidemia   . Hypertension   . Hyposmolality and/or hyponatremia   . Impacted cerumen   . Intestinal or peritoneal adhesions with obstruction (postoperative) (postinfection) (HCC)   . Laceration of left ear canal 04/13/2014  . Long term (current) use of anticoagulants   . Loss of weight   . Lumbago   . Macular degeneration (senile) of retina, unspecified   . Major depressive disorder, single episode, unspecified   . Osteoarthrosis, unspecified whether generalized or localized, unspecified site   . Osteoporosis, senile   . Other and unspecified hyperlipidemia   . Other pulmonary embolism and infarction   . Other vitamin B12 deficiency anemia   . Pain in limb   . Postoperative anemia due to acute  blood loss 01/31/2014  . Unspecified cataract   . Unspecified essential hypertension   . Unspecified glaucoma(365.9)   . Unspecified hearing loss   . Unspecified hypothyroidism   . Vitamin B 12 deficiency 06/19/2015   Social History   Social History  . Marital status: Widowed    Spouse name: N/A  . Number of children: N/A  . Years of education: N/A   Social History Main Topics  . Smoking status: Former Smoker    Years: 40.00    Types: Cigarettes  . Smokeless tobacco: Never Used  . Alcohol use No  . Drug use: No  . Sexual activity: No   Other Topics Concern  . None   Social History Narrative  . None   Family History  Problem Relation Age of Onset  . Congestive Heart Failure Mother   . Macular degeneration Mother   . Heart disease Mother   . Cancer Sister        lung cancer  . Arthritis Daughter   . Heart disease Daughter   . Glaucoma Sister   . Cataracts Sister    Scheduled Meds: . glycopyrrolate  0.2 mg Intravenous Q4H  . latanoprost  1 drop Both Eyes QHS  . levothyroxine  50 mcg Intravenous Daily   Continuous Infusions: . 0.9 % NaCl with KCl 20 mEq / L 10 mL/hr at 06/18/17 1800  . chlorproMAZINE (THORAZINE) IV    . methocarbamol (  ROBAXIN)  IV    . morphine     PRN Meds:.antiseptic oral rinse, chlorproMAZINE (THORAZINE) IV, diphenhydrAMINE, [DISCONTINUED] haloperidol **OR** [DISCONTINUED] haloperidol **OR** haloperidol lactate, hydrALAZINE, methocarbamol (ROBAXIN)  IV, morphine **AND** morphine, [DISCONTINUED] ondansetron **OR** ondansetron (ZOFRAN) IV, polyvinyl alcohol Allergies  Allergen Reactions  . Lyrica [Pregabalin] Other (See Comments)    Auditory hallucinations  . Celebrex [Celecoxib]     unknown  . Chlorzoxazone     unknown  . Fentanyl Anxiety   Review of Systems  Unable to perform ROS  Physical Exam  Constitutional: She appears lethargic. She has a sickly appearance. No distress.  Frail woman lying in bed  HENT:  Head: Normocephalic and  atraumatic.  Mouth/Throat: Mucous membranes are dry.  Eyes: EOM are normal.  Poor vision  Neck: Normal range of motion. Neck supple.  Cardiovascular: Regular rhythm.  Tachycardia present.   Pulmonary/Chest: Accessory muscle usage present. Tachypnea noted. She has decreased breath sounds. She has rhonchi.  Left chest tube  Abdominal: Soft. Bowel sounds are decreased.  Musculoskeletal:  Minimally moving self in bed, follows simple commands with repeat cues. BLE weakness  Neurological: She appears lethargic.  Oriented to self only. Can briefly socially interact before tiring and falling back asleep. Follows simple commands.   Skin: Skin is warm and dry. There is pallor.  Psychiatric: Cognition and memory are impaired.  Calm in bed when resting. Becomes acutely agitated with any movement.    Vital Signs: BP (!) 126/55 (BP Location: Right Arm)   Pulse (!) 105   Temp (!) 97.5 F (36.4 C) (Axillary)   Resp (!) 22   Ht 5\' 3"  (1.6 m)   Wt 54.1 kg (119 lb 4.8 oz)   SpO2 95%   BMI 21.13 kg/m  Pain Assessment: PAINAD POSS *See Group Information*: 1-Acceptable,Awake and alert Pain Score: Asleep   SpO2: SpO2: 95 % O2 Device:SpO2: 95 % O2 Flow Rate: .O2 Flow Rate (L/min): 15 L/min  IO: Intake/output summary:  Intake/Output Summary (Last 24 hours) at 06/20/17 1322 Last data filed at 06/20/17 0800  Gross per 24 hour  Intake              120 ml  Output                0 ml  Net              120 ml    LBM: Last BM Date:  (pta) Baseline Weight: Weight: 54.1 kg (119 lb 4.8 oz) Most recent weight: Weight: 54.1 kg (119 lb 4.8 oz)     Palliative Assessment/Data: PPS 10-20%    Time Total: 70 minutes Greater than 50%  of this time was spent counseling and coordinating care related to the above assessment and plan.  Signed by: Murrell Converse, NP Palliative Medicine Team Pager # [phone] (M-F 7a-5p) Team Phone # [phone] (Nights/Weekends)

## 2017-06-20 NOTE — Progress Notes (Signed)
Patient ID: Jane [name], female   DOB: September 24, 1925, 83 y.o.   MRN: [id number]  [facility] Surgery Progress Note     Subjective: CC- pain Daughter, [name], at bedside. States that patient is unable to swallow tablets. IV pain medication did help and did not drop her O2 sats, but did not last very long.  Palliative consult pending.  Objective: Vital signs in last 24 hours: Temp:  [97.5 F (36.4 C)-98.9 F (37.2 C)] 97.5 F (36.4 C) (08/15 0739) BP: (113-134)/(52-56) 126/55 (08/15 0739) Last BM Date:  (pta)  Intake/Output from previous day: No intake/output data recorded. Intake/Output this shift: No intake/output data recorded.  PE: Gen:  Alert, confused, pleasant HEENT: EOM's intact, pupils equal and round Card:  RRR, no M/G/R heard Pulm:  Coarse breath sounds bilaterally, no wheezes, on NRB O2. O2 sats ~95% while I was in the room. Tachypneic. Right sided chest wall tenderness Abd: Soft, NT/ND, +BS, no HSM, no hernia Ext:  No erythema, edema, or tenderness BUE/BLE  Skin: no rashes noted, warm and dry  Lab Results:   Recent Labs  06/18/17 0030 06/19/17 0206  WBC 11.7* 7.6  HGB 11.6* 10.6*  HCT 34.9* 33.6*  PLT 171 161   BMET  Recent Labs  06/18/17 0030 06/19/17 0206  NA 140 142  K 3.9 4.3  CL 106 108  CO2 24 29  GLUCOSE 128* 124*  BUN 22* 24*  CREATININE 0.76 0.98  CALCIUM 9.3 8.8*   PT/INR No results for input(s): LABPROT, INR in the last 72 hours. CMP     Component Value Date/Time   NA 142 06/19/2017 0206   NA 142 01/04/2016   K 4.3 06/19/2017 0206   CL 108 06/19/2017 0206   CO2 29 06/19/2017 0206   GLUCOSE 124 (H) 06/19/2017 0206   BUN 24 (H) 06/19/2017 0206   BUN 21 01/04/2016   CREATININE 0.98 06/19/2017 0206   CREATININE 0.74 09/14/2016 1415   CALCIUM 8.8 (L) 06/19/2017 0206   PROT 6.0 (L) 06/18/2017 0030   PROT 6.3 02/22/2015 1420   ALBUMIN 4.1 06/18/2017 0030   ALBUMIN 4.5 02/22/2015 1420   AST 85 (H) 06/18/2017 0030   ALT 70  (H) 06/18/2017 0030   ALKPHOS 62 06/18/2017 0030   BILITOT 0.6 06/18/2017 0030   BILITOT 0.4 02/22/2015 1420   GFRNONAA 50 (L) 06/19/2017 0206   GFRNONAA 72 09/14/2016 1415   GFRAA 58 (L) 06/19/2017 0206   GFRAA 83 09/14/2016 1415   Lipase     Component Value Date/Time   LIPASE 25 03/22/2008 2015       Studies/Results: Dg Chest Port 1 View  Result Date: 06/19/2017 CLINICAL DATA:  83 year old female with multiple rib fractures. Subsequent encounter. EXAM: PORTABLE CHEST 1 VIEW COMPARISON:  06/18/2017. FINDINGS: Multiple left-sided rib fractures. Left-sided chest tube is in place with kink at the level of the lateral aspect of the left thorax. This is a change from the prior examination. Left-sided pleural effusion without evidence of left-sided pneumothorax or hydropneumothorax. Consolidation left base may be related to the pleural fluid but cannot exclude underlying atelectasis, infiltrate or mass. Mediastinal and cardiac silhouette stable.  Calcified aorta. Probable skin fold right lung apex. Given the right-sided rib fractures, anteriorly located pneumothorax not entirely excluded. Right base subsegmental atelectasis. Small amount of subcutaneous emphysema lower right chest wall. Left humerus fracture appears remote. Scoliosis thoracic spine. IMPRESSION: When compared to the prior examination, there is now a kink within the left-sided chest tube as  it crosses the left lateral chest. Left pleural effusion without left-sided pneumothorax hydropneumothorax. Probable skin fold right lung apex rather than pneumothorax. Attention to this on follow-up. Remainder of findings unchanged. Aortic Atherosclerosis (ICD10-I70.0). Electronically Signed   By: Lacy Duverney M.D.   On: 06/19/2017 06:50    Anti-infectives: Anti-infectives    None       Assessment/Plan Fall Bilateral rib fractures with occult right pneumothorax and moderate left hemothorax- s/p left CT 8/13 Hypoxic resp failure - on  NRB. Continue pulmonary toilet as able DNR/DNI VTE - SCDs, no chemical DVT prophylaxis due to anemia Dispo - Patient moving to comfort care. Palliative consult pending for end of life/symptom management. Add morphine infusion.   LOS: 2 days    Franne Forts , [facility] 06/20/2017, 10:53 AM Pager: [phone] Consults: [phone] Mon-Fri 7:00 am-4:30 pm Sat-Sun 7:00 am-11:30 am

## 2017-06-20 NOTE — Progress Notes (Signed)
Patient ID: Jane [name], female   DOB: 18-Sep-1925, 83 y.o.   MRN: [id number]  Patient transitioning to comfort care, likely going to residential hospice at [facility] tomorrow.  Discussed with MD, chest tube removed and occlusive dressing applied.  Correy Weidner A Rondell Pardon

## 2017-06-21 ENCOUNTER — Other Ambulatory Visit: Payer: Medicare Other

## 2017-06-21 ENCOUNTER — Ambulatory Visit: Payer: Medicare Other

## 2017-06-21 MED ORDER — SCOPOLAMINE 1 MG/3DAYS TD PT72
1.0000 | MEDICATED_PATCH | TRANSDERMAL | Status: DC
  Administered 2017-06-21: 1.5 mg via TRANSDERMAL
  Filled 2017-06-21: qty 1

## 2017-06-21 MED ORDER — MORPHINE BOLUS VIA INFUSION
3.0000 mg | INTRAVENOUS | Status: DC | PRN
  Administered 2017-06-21 (×2): 3 mg via INTRAVENOUS
  Filled 2017-06-21: qty 3

## 2017-06-21 MED ORDER — ACETAMINOPHEN 650 MG RE SUPP
650.0000 mg | Freq: Four times a day (QID) | RECTAL | Status: DC | PRN
  Administered 2017-06-21: 650 mg via RECTAL
  Filled 2017-06-21: qty 1

## 2017-06-21 MED ORDER — SODIUM CHLORIDE 0.9 % IV SOLN
2.0000 mg/h | INTRAVENOUS | Status: DC
  Administered 2017-06-21: 2 mg/h via INTRAVENOUS
  Filled 2017-06-21: qty 10

## 2017-06-21 MED ORDER — ACETAMINOPHEN 650 MG RE SUPP: 650.0000 mg | RECTAL | Status: DC | PRN

## 2017-06-22 ENCOUNTER — Telehealth: Payer: Self-pay | Admitting: *Deleted

## 2017-06-22 NOTE — Telephone Encounter (Signed)
Daughter, Jane [name] just wanted to call to let you know her mother passed away yesterday from a fall she had on Sunday. Daughter stated that she passed away peacefully.

## 2017-06-23 NOTE — Telephone Encounter (Signed)
I'm glad to hear that she passed peacefully.  She will be missed.  Let's get a sympathy card signed, please.

## 2017-06-26 ENCOUNTER — Ambulatory Visit: Payer: Medicare Other

## 2017-06-28 ENCOUNTER — Ambulatory Visit: Payer: Medicare Other

## 2017-07-07 NOTE — Progress Notes (Signed)
Chaplain responded to the request of the Nursing Unit to provide emotional support, ministry of presence for the the daughter, and son-in- law as theyshared life memories that had with the patient.  They were appreciative of all the support they had received from the staff. Chaplain offered a prayer of comfort on their behalf. They were informed of all necessary information needed by the staff for final patient placement arrangements for the patient. Chaplain Janell Quiet [phone]

## 2017-07-07 NOTE — Progress Notes (Signed)
Patient's daughter and POA does not want patient to have ordered rectal Tylenol for fever.

## 2017-07-07 NOTE — Discharge Summary (Signed)
  [facility] Discharge Summary   Patient ID: Jane [name] MRN: [id number] DOB/AGE: [birth year] 83 y.o.  Admit date: 12-Jul-2017 Date of death: 07-16-17  Admitting Diagnosis: Fall Multiple bilateral rib fractures Right pneumothorax Left hemopneumothorax  Consultants Palliative care  Imaging: Dg Chest Port 1 View  Result Date: 06/20/2017 CLINICAL DATA:  Bilateral rib fractures. Right pneumothorax. Left chest tube. EXAM: PORTABLE CHEST 1 VIEW COMPARISON:  06/19/2017 FINDINGS: There is no visible right lower left pneumothorax. Left chest tube in place. Persistent moderate left effusion, slightly diminished. Small right effusion, slightly increased. Multiple displaced bilateral rib fractures are noted. Overall heart size and pulmonary vascularity are normal. Calcification in the thoracic aorta. IMPRESSION: 1. No pneumothorax. 2. Slight decrease in the left pleural effusion. 3. Slight increase in small right effusion. Electronically Signed   By: Francene Boyers M.D.   On: 06/20/2017 13:07   CT chest with contrast 06/18/17: 1. Small right-sided pneumothorax. 2. Trace right and moderate left pleural effusions. 3. Right-sided displaced acute rib fractures from rib 4 through rib 11 with two part fractures of rib 6 through rib 9. 4. Left-sided displaced acute rib fractures from rib 7 through rib 10 with two part fractures of ribs 7 and 8. 5. Subcutaneous emphysema and edema in the right lateral and posterior chest wall. 6. Stable cardiomegaly, coronary calcification, and centrilobular emphysema. 7. Mild nonspecific intrahepatic biliary ductal dilatation.  CT head and cervical spine wo contrast 06/18/17: 1. No evidence of traumatic intracranial injury or fracture. 2. No evidence of acute fracture or subluxation along the cervical spine. 3. Mild cortical volume loss and diffuse small vessel ischemic microangiopathy. 4. Chronic lacunar infarct at the right basal ganglia. 5.  Mild diffuse degenerative change along the cervical spine. 6. Trace right apical pneumothorax. Bilateral emphysema at the lung apices.  Procedures Dr. Janee Morn (06/18/2017) - Left chest tube placement  Hospital Course:  Jane [name] is an 83yo female PMH dementia with code status of DNR, who was brought to [facility] 8/13 via EMS from her SNF after suffering a ground level fall. She had immediate bilateral rib pain. CT chest shows bilateral rib fractures, occult right pneumothorax, moderate left Hemothorax. Head CT scan showed no acute injury. A left chest tube was placed in the ED, and patient was admitted to trauma service for monitoring. Unfortunately patient quickly deteriorated after this. She was unable to swallow medications. She continued to have severe pain from her rib fractures and her O2 sats decreased. Ms. Garciaperez's worsening condition was dicussed with the family and ultimately decided not to escalate care. Palliative care team was consulted for goals of care, and eventually for symptom management for end of life. She was started on a morphine infusion for pain control. Chest tube was removed for increased comfort. On July 16, 2017 Ms. Mcmahen passed comfortably.    Signed: Franne Forts, [facility] Surgery 2017/07/16, 3:31 PM Pager: [phone] Consults: [phone] Mon-Fri 7:00 am-4:30 pm Sat-Sun 7:00 am-11:30 am

## 2017-07-07 NOTE — Progress Notes (Signed)
Daily Progress Note   Patient Name: Jane [name]       Date: 06/06/2017 DOB: 28-Dec-1924  Age: 83 y.o. MRN#: [id number] Attending Physician: Roslynn Amble, MD Primary Care Physician: Kermit Balo, DO Admit Date: 07-09-2017  Reason for Consultation/Follow-up: Non pain symptom management, Pain control, Psychosocial/spiritual support and Terminal Care  Subjective: Mrs. Joslyn Hy is now unresponsive. She does appear comfortable in bed with no signs of discomfort or distress. I have been speaking with her daughter, [name], over the phone. [name] is very focused on comfort focused care and understands her mother is in the dying process.   Length of Stay: 3  Current Medications: Scheduled Meds:  . glycopyrrolate  0.2 mg Intravenous Q4H  . latanoprost  1 drop Both Eyes QHS  . levothyroxine  50 mcg Intravenous Daily  . scopolamine  1 patch Transdermal Q72H    Continuous Infusions: . 0.9 % NaCl with KCl 20 mEq / L 10 mL/hr at 06/18/17 1800  . chlorproMAZINE (THORAZINE) IV    . methocarbamol (ROBAXIN)  IV    . morphine 1 mg/hr (06/20/17 1349)    PRN Meds: acetaminophen, antiseptic oral rinse, bisacodyl, chlorproMAZINE (THORAZINE) IV, diphenhydrAMINE, [DISCONTINUED] haloperidol **OR** [DISCONTINUED] haloperidol **OR** haloperidol lactate, hydrALAZINE, methocarbamol (ROBAXIN)  IV, morphine **AND** morphine, [DISCONTINUED] ondansetron **OR** ondansetron (ZOFRAN) IV, polyvinyl alcohol  Physical Exam  Constitutional: She has a sickly appearance. Nasal cannula in place.  HENT:  Head: Normocephalic and atraumatic.  Mouth/Throat: Mucous membranes are dry.  Blue mottling around lips  Cardiovascular:  Bradycardia, HR 40-50's  Pulmonary/Chest:  RR 24, audible secretions. Breaths shallow but remain rhythmic with no periods of apnea. O2 76% on 3L O2 via  [place].  Abdominal: Soft. Bowel sounds are decreased.  Musculoskeletal:  Not moving self in bed.   Neurological: She is unresponsive.  Skin: There is pallor.  Hot to touch, feverish  Psychiatric:  UTA, pt unresponsive           Vital Signs: BP 125/72   Pulse 96   Temp (!) 103.1 F (39.5 C) (Axillary)   Resp (!) 30   Ht 5\' 3"  (1.6 m)   Wt 54.1 kg (119 lb 4.8 oz)   SpO2 90%   BMI 21.13 kg/m  SpO2: SpO2: 90 % O2 Device: O2 Device: Nasal Cannula O2 Flow Rate: O2 Flow Rate (L/min): 3 L/min  Intake/output summary:  Intake/Output Summary (Last 24 hours) at 07/06/2017 0915 Last data filed at 06/27/2017 0602  Gross per 24 hour  Intake           486.45 ml  Output              375 ml  Net           111.45 ml   LBM: Last BM Date:  (PTA) Baseline Weight: Weight: 54.1 kg (119 lb 4.8 oz) Most recent weight: Weight: 54.1 kg (119 lb 4.8 oz)  Palliative Assessment/Data: PPS 10%    Patient Active Problem List   Diagnosis Date Noted  . Fall at nursing home   . Multiple fractures of ribs, bilateral, sequela   . Hemothorax on left   . Pneumothorax on left   . Palliative care by  specialist   . Goals of care, counseling/discussion   . End of life care   . Multiple fractures of ribs, bilateral, initial encounter for closed fracture 06/18/2017  . Chronic idiopathic constipation 04/26/2017  . Frequent falls 01/25/2017  . Multiple closed fractures of ribs of left side 01/25/2017  . Pill dysphagia 01/25/2017  . Hernia of abdominal cavity 02/21/2016  . Primary osteoarthritis of right knee 02/21/2016  . Peripheral neuropathy 02/21/2016  . Severe auditory hallucinations 02/21/2016  . Cerumen impaction 02/21/2016  . Vitamin B 12 deficiency 06/19/2015  . Fracture of orbital floor, left, closed 06/18/2015  . Closed fracture of maxillary sinus, left 06/18/2015  . Humeral fracture 06/16/2015  . Dementia 06/16/2015  . Fall at home 06/16/2015  . Humerus fracture   . Near syncope 02/27/2015  .  Hyperlipidemia 02/27/2015  . Acute delirium 02/27/2015  . Vaginal yeast infection 02/27/2015  . Enterococcus UTI 02/27/2015  . Vertigo   . Hypothyroidism due to acquired atrophy of thyroid 06/11/2014  . Hair loss 06/11/2014  . Protein-calorie malnutrition, severe (HCC) 06/11/2014  . Chronic obstructive airway disease with asthma (HCC) 06/11/2014  . Vaginal irritation 06/11/2014  . Localized skin mass, lump, or swelling 06/11/2014  . Psychosis 06/04/2013  . Senile osteoporosis 02/13/2013  . Depression 02/13/2013  . Osteoarthritis of right hip 02/13/2013  . Essential hypertension, benign 02/13/2013  . Anemia, iron deficiency 02/13/2013  . Hypothyroidism 02/13/2013  . Hiatal hernia 02/13/2013    Palliative Care Assessment & Plan   HPI: 83 y.o. female  with past medical history of vascular dementia with hallucinations and progressive dysphagia, recurrent falls with injury, hypothyroidism, osteoporosis, and depression. She now presents after a fall at her SNF. She was admitted on 07/01/2017 . Work-up on admission revealed bilateral rib fractures, occult right pneumothorax, and moderate left hemothorax. Left sided chest tube placed in the ED. Given her dementia and frailty, strong concern for poor prognosis. Pt has declined since admission and is now unable to tolerate oral intake and continues to struggle with ongoing significant pain. Palliative consulted for goals of care and likely symptom management for end of life.   Assessment: Mrs. Joslyn Hy is now no longer responsive. After talking with her daughter we agreed to change her from the NR[facility] to [facility]. I remained in the room for this transition. On vital sign assessment her HR fluctuated between the 40's and 50's, RR 24, O2 saturation 76% on 3L O2 via [place]. She does have some blue mottling around her lips. Given her precipitous decline, I believe she is not stable for transition out of the hospital and anticipate a hospital death in hours to a day.  I've updated the Hospice group, left a message for the CM, and updated the primary team.   Recommendations/Plan:  Full comfort measures. Pt with rapid deterioration and now anticipate a hospital death in hours to a day.  Goals of Care and Additional Recommendations:  Limitations on Scope of Treatment: Full Comfort Care  Code Status:  DNR  Prognosis:   Hours - Days  Discharge Planning:  Anticipated Hospital Death  Care plan was discussed with pt's daughter, care nurses, primary team, [facility] liaison.   Thank you for allowing the Palliative Medicine Team to assist in the care of this patient.  Time in/out: 0900/0930; 1230/1240 Total time: 65 minutes    Greater than 50%  of this time was spent counseling and coordinating care related to the above assessment and plan.  Murrell Converse, NP Palliative Medicine  Team [phone] pager (7a-5p) Team Phone # [phone]

## 2017-07-07 NOTE — Clinical Social Work Note (Signed)
CSW spoke with Jane [name] at [facility] and per palliative they are not going to move pt to [facility].   [place], [place] [phone]

## 2017-07-07 NOTE — Progress Notes (Signed)
Patient noted to be unresponsive to voice this am.  Breathing shallow, HR tachy, mild gurgling noted.  Palliative care RN DC'D nonrebreather and ordered 3L/[place].  Temp axillary 103.2 after Tylenol p.r. 650 given at 6am.  MD notified.  Robinul given as ordered.  Patient does not appear to be in any acute distress.  Distal extremities warm with moderate DP pulses noted.  Morphine 2 mg IV drip bolus given at 7:45am and 8:45 am as ordered for patient comfort.  Will continue to monitor.

## 2017-07-07 NOTE — Progress Notes (Signed)
Patient ID: Jane [name], female   DOB: Mar 02, 1925, 83 y.o.   MRN: [id number]  [facility] Surgery Progress Note     Subjective: CC- respiratory distress Patient now with shallow breathing, tachycardia, mild gurgling. Status declining but in no acute distress. Palliative transitioned patient to full comfort measures, no longer going to hospice at [facility] place today but will remain in hospital.  Objective: Vital signs in last 24 hours: Temp:  [97.5 F (36.4 C)-103.2 F (39.6 C)] 103.1 F (39.5 C) (08/16 0902) Pulse Rate:  [96-147] 96 (08/16 0527) Resp:  [21-30] 30 (08/16 0902) BP: (118-128)/(59-73) 125/72 (08/16 0907) SpO2:  [90 %-95 %] 90 % (08/16 0527) Last BM Date:  (PTA)  Intake/Output from previous day: 08/15 0701 - 08/16 0700 In: 606.5 [P.O.:120; I.V.:486.5] Out: 375 [Urine:375] Intake/Output this shift: No intake/output data recorded.  PE: Gen:  resting comfortably Card:  tachy Pulm:  Coarse breath sounds bilaterally. Tachypneic. O2 sats in 70's. On NRB O2 Abd: Soft, ND, +BS, no HSM, no hernia Ext:  No erythema, edema, or tenderness BUE/BLE  Skin: no rashes noted, warm and dry   Lab Results:   Recent Labs  06/19/17 0206  WBC 7.6  HGB 10.6*  HCT 33.6*  PLT 161   BMET  Recent Labs  06/19/17 0206  NA 142  K 4.3  CL 108  CO2 29  GLUCOSE 124*  BUN 24*  CREATININE 0.98  CALCIUM 8.8*   PT/INR No results for input(s): LABPROT, INR in the last 72 hours. CMP     Component Value Date/Time   NA 142 06/19/2017 0206   NA 142 01/04/2016   K 4.3 06/19/2017 0206   CL 108 06/19/2017 0206   CO2 29 06/19/2017 0206   GLUCOSE 124 (H) 06/19/2017 0206   BUN 24 (H) 06/19/2017 0206   BUN 21 01/04/2016   CREATININE 0.98 06/19/2017 0206   CREATININE 0.74 09/14/2016 1415   CALCIUM 8.8 (L) 06/19/2017 0206   PROT 6.0 (L) 06/18/2017 0030   PROT 6.3 02/22/2015 1420   ALBUMIN 4.1 06/18/2017 0030   ALBUMIN 4.5 02/22/2015 1420   AST 85 (H) 06/18/2017 0030   ALT  70 (H) 06/18/2017 0030   ALKPHOS 62 06/18/2017 0030   BILITOT 0.6 06/18/2017 0030   BILITOT 0.4 02/22/2015 1420   GFRNONAA 50 (L) 06/19/2017 0206   GFRNONAA 72 09/14/2016 1415   GFRAA 58 (L) 06/19/2017 0206   GFRAA 83 09/14/2016 1415   Lipase     Component Value Date/Time   LIPASE 25 03/22/2008 2015       Studies/Results: Dg Chest Port 1 View  Result Date: 06/20/2017 CLINICAL DATA:  Bilateral rib fractures. Right pneumothorax. Left chest tube. EXAM: PORTABLE CHEST 1 VIEW COMPARISON:  06/19/2017 FINDINGS: There is no visible right lower left pneumothorax. Left chest tube in place. Persistent moderate left effusion, slightly diminished. Small right effusion, slightly increased. Multiple displaced bilateral rib fractures are noted. Overall heart size and pulmonary vascularity are normal. Calcification in the thoracic aorta. IMPRESSION: 1. No pneumothorax. 2. Slight decrease in the left pleural effusion. 3. Slight increase in small right effusion. Electronically Signed   By: Francene Boyers M.D.   On: 06/20/2017 13:07    Anti-infectives: Anti-infectives    None       Assessment/Plan Fall Bilateral rib fractures with occult right pneumothorax and moderate left hemothorax- s/p left CT 8/13, removed 8/15 for improved comfort Hypoxic resp failure - patient declining, now on [place] and full comfort measures DNR/DNI  Dispo- Patient now on full comfort measures. Appreciate palliative care assistance.   LOS: 3 days    Franne Forts , [facility] Surgery 06/19/2017, 9:12 AM Pager: [phone] Consults: [phone] Mon-Fri 7:00 am-4:30 pm Sat-Sun 7:00 am-11:30 am

## 2017-07-07 DEATH — deceased

## 2017-08-16 ENCOUNTER — Ambulatory Visit: Payer: Medicare Other | Admitting: Internal Medicine

## 2017-11-08 ENCOUNTER — Ambulatory Visit: Payer: Medicare Other

## 2018-05-17 IMAGING — CT CT CERVICAL SPINE W/O CM
4 of 8 series · 12 of 33 positions shown, 13 images · non-contrast
Comparison: 03/07/2016

CLINICAL DATA: Three unwitnessed falls over 24 hours. Syncopal
episodes. Posterior neck pain. History of hypertension, dementia.

EXAM:
CT HEAD WITHOUT CONTRAST
CT CERVICAL SPINE WITHOUT CONTRAST
TECHNIQUE: Multidetector CT imaging of the head and cervical spine was
performed following the standard protocol without intravenous
contrast. Multiplanar CT image reconstructions of the cervical spine
were also generated.

[Series 7: c-spine st · axial · 0.29mm/px · z∈[-242,-156]mm · 3 of 85 slices shown, 4 images]
[im 22/85  soft-tissue]
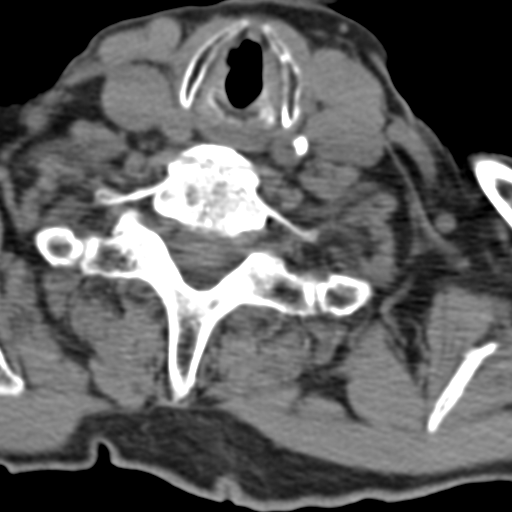
[im 22/85  bone]
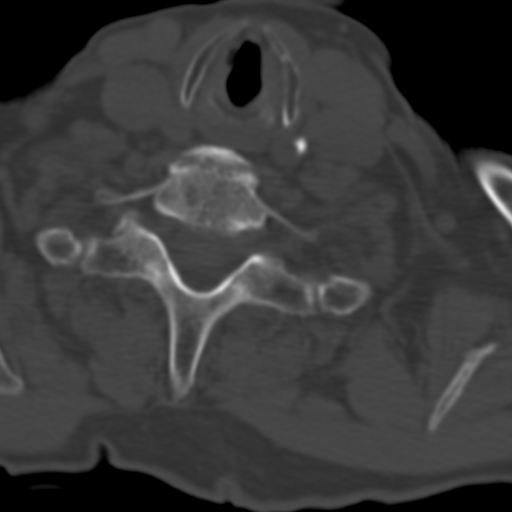
[im 43/85  bone]
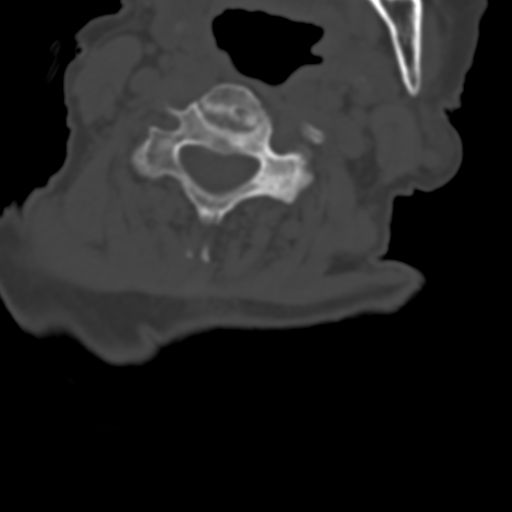
[im 64/85  bone]
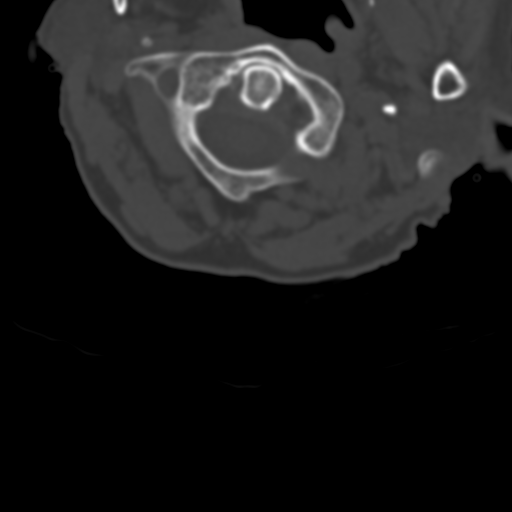

[Series 9: axial recon · axial · 0.18mm/px · z∈[-253,-174]mm · 3 of 85 slices shown]
[im 22/85  bone]
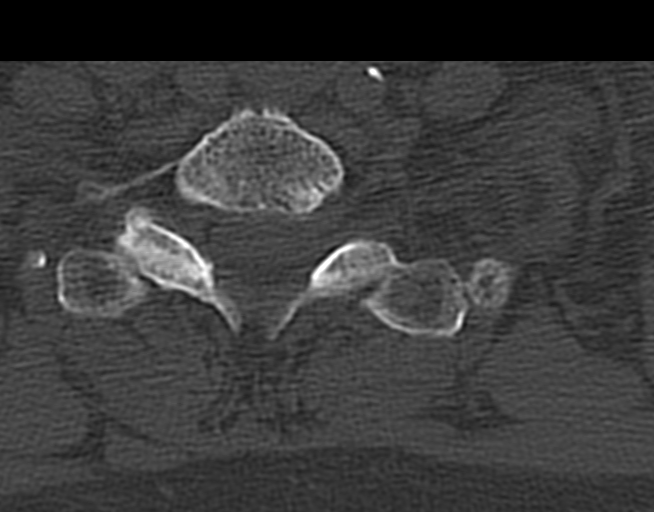
[im 43/85  bone]
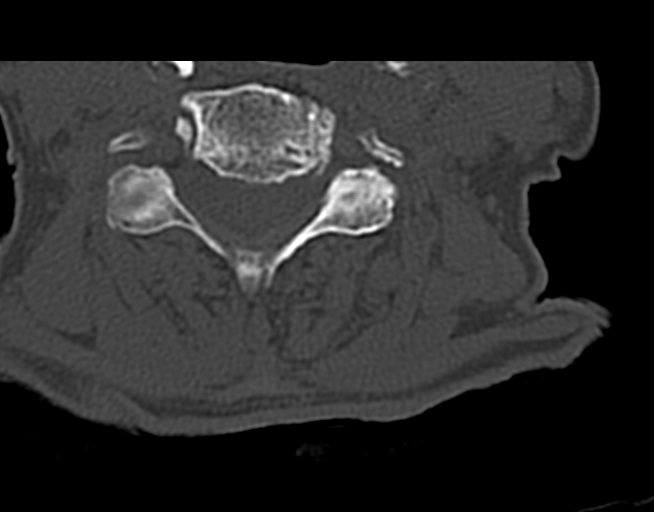
[im 64/85  bone]
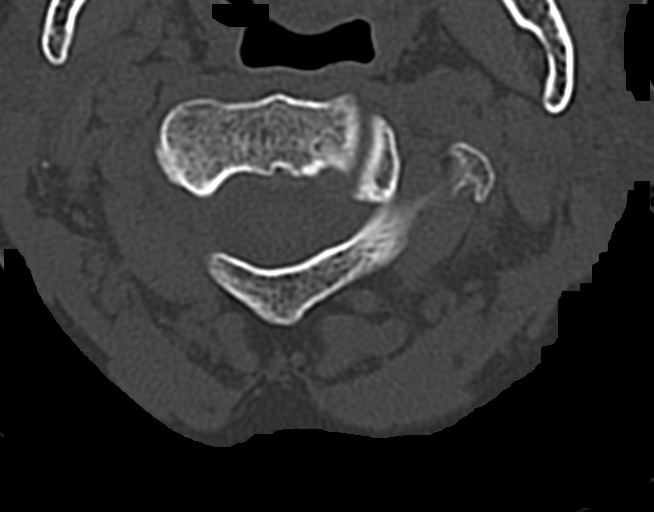

[Series 10: coronal · coronal · 0.25mm/px · 1 of 46 slices shown]
[im 23/46  bone]
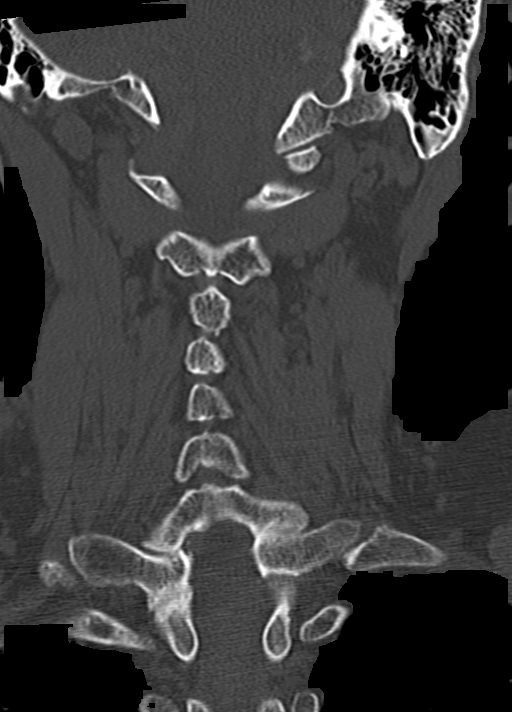

[Series 11: sagittal · sagittal · 0.20mm/px · 5 of 61 slices shown]
[im 11/61  bone]
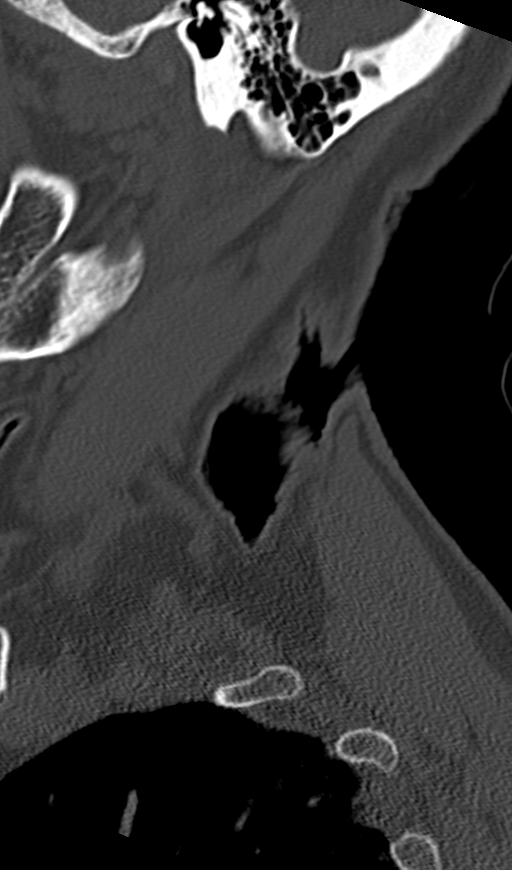
[im 21/61  bone]
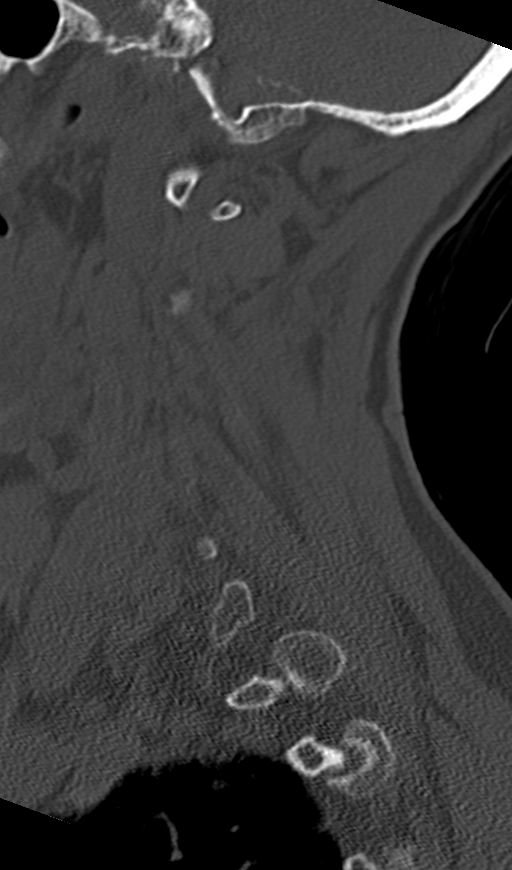
[im 31/61  bone]
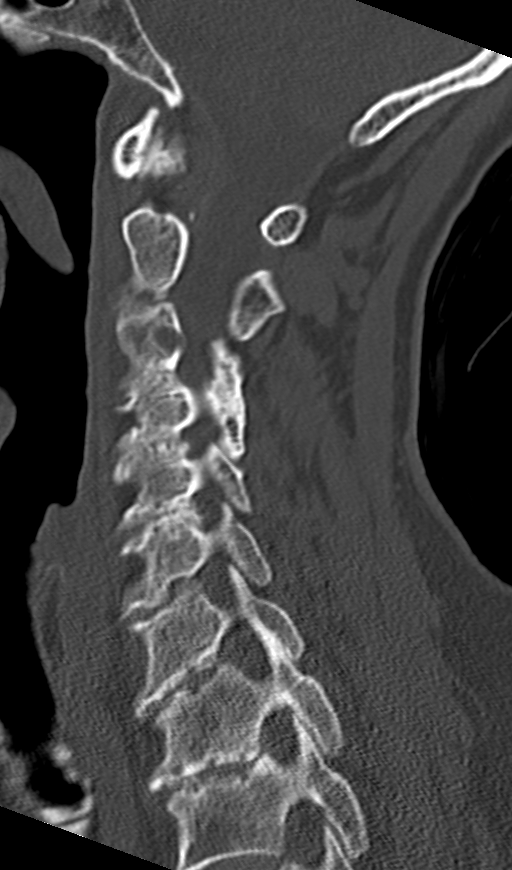
[im 41/61  bone]
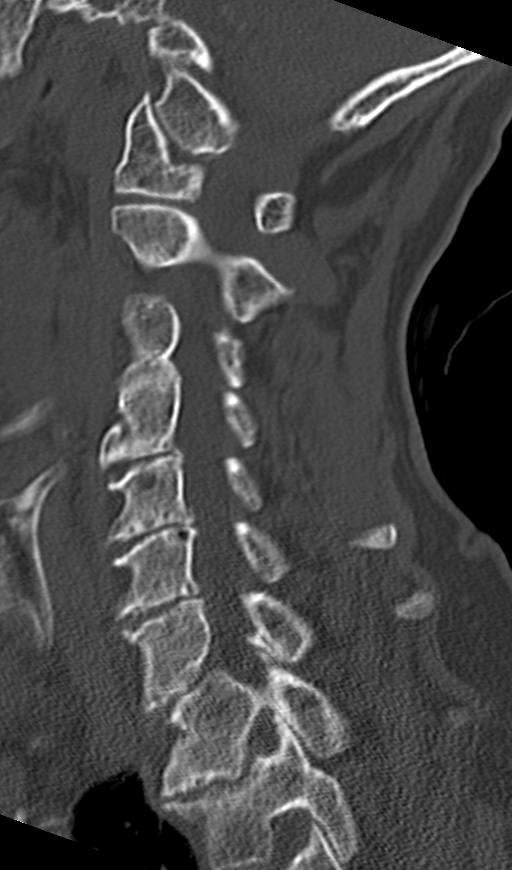
[im 51/61  bone]
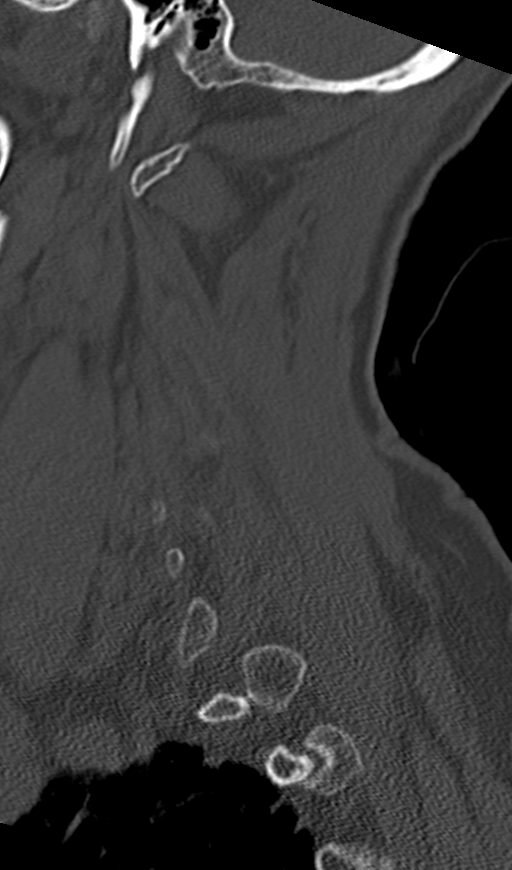

[12 of 33 positions shown; findings below may reference images not displayed]

FINDINGS: CT HEAD FINDINGS

Brain: Diffuse cerebral atrophy. Ventricular dilatation consistent
with central atrophy. Low-attenuation changes in the deep white
matter consistent with small vessel ischemia. No mass effect or
midline shift. No abnormal extra-axial fluid collections. Gray-white
matter junctions are distinct. Basal cisterns are not effaced. No
acute intracranial hemorrhage.

Vascular: Calcified and dilated intracranial carotid arteries.

Skull: Calvarium appears intact.

Sinuses/Orbits: No acute finding.

Other: No significant changes since prior study.

CT CERVICAL SPINE FINDINGS

Alignment: Normal.

Skull base and vertebrae: No acute fracture. No primary bone lesion
or focal pathologic process.

Soft tissues and spinal canal: No prevertebral fluid or swelling. No
visible canal hematoma.

Disc levels: Diffuse degenerative change throughout the cervical
spine with narrowed interspaces and endplate hypertrophic changes.
Degenerative changes throughout the facet joints.

Upper chest: Prominent emphysematous changes in the lung apices.

Other: Vascular calcifications in the carotid arteries.
IMPRESSION: No acute intracranial abnormalities. Chronic atrophy and small
vessel ischemic changes.

Normal alignment of the cervical spine. Degenerative changes
throughout the cervical spine. No acute displaced fractures
identified.
